# Patient Record
Sex: Female | Born: 1935
Health system: Southern US, Community
[De-identification: ages and names within clinical notes are randomized; demographics above are authoritative.]

## PROBLEM LIST (undated history)

## (undated) DIAGNOSIS — D649 Anemia, unspecified: Secondary | ICD-10-CM

## (undated) DIAGNOSIS — E039 Hypothyroidism, unspecified: Secondary | ICD-10-CM

## (undated) DIAGNOSIS — I10 Essential (primary) hypertension: Secondary | ICD-10-CM

## (undated) DIAGNOSIS — Z9981 Dependence on supplemental oxygen: Secondary | ICD-10-CM

## (undated) DIAGNOSIS — D509 Iron deficiency anemia, unspecified: Secondary | ICD-10-CM

## (undated) DIAGNOSIS — J189 Pneumonia, unspecified organism: Secondary | ICD-10-CM

## (undated) DIAGNOSIS — R55 Syncope and collapse: Secondary | ICD-10-CM

## (undated) DIAGNOSIS — J42 Unspecified chronic bronchitis: Secondary | ICD-10-CM

## (undated) DIAGNOSIS — D500 Iron deficiency anemia secondary to blood loss (chronic): Secondary | ICD-10-CM

## (undated) DIAGNOSIS — R002 Palpitations: Secondary | ICD-10-CM

## (undated) DIAGNOSIS — K31819 Angiodysplasia of stomach and duodenum without bleeding: Secondary | ICD-10-CM

## (undated) HISTORY — PX: LUMBAR DISC SURGERY: SHX700

## (undated) HISTORY — PX: JOINT REPLACEMENT: SHX530

## (undated) HISTORY — PX: TOTAL KNEE ARTHROPLASTY: SHX125

## (undated) HISTORY — PX: BACK SURGERY: SHX140

## (undated) HISTORY — DX: Angiodysplasia of stomach and duodenum without bleeding: K31.819

## (undated) HISTORY — PX: DILATION AND CURETTAGE OF UTERUS: SHX78

## (undated) HISTORY — PX: KNEE ARTHROSCOPY: SHX127

## (undated) HISTORY — DX: Hypothyroidism, unspecified: E03.9

## (undated) HISTORY — PX: TONSILLECTOMY AND ADENOIDECTOMY: SUR1326

## (undated) HISTORY — DX: Iron deficiency anemia secondary to blood loss (chronic): D50.0

## (undated) HISTORY — PX: TOTAL ABDOMINAL HYSTERECTOMY: SHX209

## (undated) HISTORY — DX: Palpitations: R00.2

## (undated) HISTORY — DX: Iron deficiency anemia, unspecified: D50.9

## (undated) HISTORY — PX: HEEL SPUR SURGERY: SHX665

## (undated) HISTORY — PX: SHOULDER SURGERY: SHX246

## (undated) HISTORY — DX: Essential (primary) hypertension: I10

---

## 2000-01-22 ENCOUNTER — Ambulatory Visit (HOSPITAL_COMMUNITY): Admission: RE | Admit: 2000-01-22 | Discharge: 2000-01-22 | Payer: Self-pay | Admitting: Neurosurgery

## 2000-01-22 ENCOUNTER — Encounter: Payer: Self-pay | Admitting: Neurosurgery

## 2000-04-02 ENCOUNTER — Encounter: Payer: Self-pay | Admitting: Neurosurgery

## 2000-04-02 ENCOUNTER — Encounter: Admission: RE | Admit: 2000-04-02 | Discharge: 2000-04-02 | Payer: Self-pay | Admitting: Neurosurgery

## 2000-05-22 ENCOUNTER — Ambulatory Visit (HOSPITAL_COMMUNITY): Admission: RE | Admit: 2000-05-22 | Discharge: 2000-05-22 | Payer: Self-pay | Admitting: Neurosurgery

## 2000-05-22 ENCOUNTER — Encounter: Payer: Self-pay | Admitting: Neurosurgery

## 2000-06-06 ENCOUNTER — Encounter: Payer: Self-pay | Admitting: Neurosurgery

## 2000-06-06 ENCOUNTER — Ambulatory Visit (HOSPITAL_COMMUNITY): Admission: RE | Admit: 2000-06-06 | Discharge: 2000-06-06 | Payer: Self-pay | Admitting: Neurosurgery

## 2000-07-02 ENCOUNTER — Ambulatory Visit (HOSPITAL_COMMUNITY): Admission: RE | Admit: 2000-07-02 | Discharge: 2000-07-02 | Payer: Self-pay | Admitting: Neurosurgery

## 2000-07-02 ENCOUNTER — Encounter: Payer: Self-pay | Admitting: Neurosurgery

## 2001-03-08 ENCOUNTER — Encounter: Payer: Self-pay | Admitting: Neurosurgery

## 2001-03-12 ENCOUNTER — Inpatient Hospital Stay (HOSPITAL_COMMUNITY): Admission: RE | Admit: 2001-03-12 | Discharge: 2001-03-16 | Payer: Self-pay | Admitting: Neurosurgery

## 2001-03-12 ENCOUNTER — Encounter: Payer: Self-pay | Admitting: Neurosurgery

## 2001-03-22 ENCOUNTER — Encounter: Admission: RE | Admit: 2001-03-22 | Discharge: 2001-03-22 | Payer: Self-pay | Admitting: Neurosurgery

## 2001-03-22 ENCOUNTER — Encounter: Payer: Self-pay | Admitting: Neurosurgery

## 2001-05-22 ENCOUNTER — Encounter (HOSPITAL_COMMUNITY): Admission: RE | Admit: 2001-05-22 | Discharge: 2001-06-21 | Payer: Self-pay | Admitting: Internal Medicine

## 2001-09-03 ENCOUNTER — Ambulatory Visit (HOSPITAL_COMMUNITY): Admission: RE | Admit: 2001-09-03 | Discharge: 2001-09-03 | Payer: Self-pay | Admitting: Internal Medicine

## 2001-11-08 ENCOUNTER — Encounter: Payer: Self-pay | Admitting: Family Medicine

## 2001-11-08 ENCOUNTER — Ambulatory Visit (HOSPITAL_COMMUNITY): Admission: RE | Admit: 2001-11-08 | Discharge: 2001-11-08 | Payer: Self-pay | Admitting: Family Medicine

## 2002-03-04 ENCOUNTER — Ambulatory Visit (HOSPITAL_COMMUNITY): Admission: RE | Admit: 2002-03-04 | Discharge: 2002-03-04 | Payer: Self-pay | Admitting: Family Medicine

## 2002-03-04 ENCOUNTER — Encounter: Payer: Self-pay | Admitting: Family Medicine

## 2002-07-22 ENCOUNTER — Ambulatory Visit (HOSPITAL_BASED_OUTPATIENT_CLINIC_OR_DEPARTMENT_OTHER): Admission: RE | Admit: 2002-07-22 | Discharge: 2002-07-22 | Payer: Self-pay | Admitting: Orthopedic Surgery

## 2002-08-12 ENCOUNTER — Encounter (HOSPITAL_COMMUNITY): Admission: RE | Admit: 2002-08-12 | Discharge: 2002-09-11 | Payer: Self-pay | Admitting: Orthopedic Surgery

## 2002-09-12 ENCOUNTER — Encounter (HOSPITAL_COMMUNITY): Admission: RE | Admit: 2002-09-12 | Discharge: 2002-10-12 | Payer: Self-pay | Admitting: Orthopedic Surgery

## 2002-11-12 ENCOUNTER — Encounter: Payer: Self-pay | Admitting: Cardiology

## 2002-11-12 ENCOUNTER — Ambulatory Visit (HOSPITAL_COMMUNITY): Admission: RE | Admit: 2002-11-12 | Discharge: 2002-11-12 | Payer: Self-pay | Admitting: Cardiology

## 2002-12-17 ENCOUNTER — Emergency Department (HOSPITAL_COMMUNITY): Admission: EM | Admit: 2002-12-17 | Discharge: 2002-12-17 | Payer: Self-pay | Admitting: Emergency Medicine

## 2003-07-26 ENCOUNTER — Ambulatory Visit (HOSPITAL_COMMUNITY): Admission: RE | Admit: 2003-07-26 | Discharge: 2003-07-26 | Payer: Self-pay | Admitting: Neurosurgery

## 2003-07-26 ENCOUNTER — Encounter: Payer: Self-pay | Admitting: Neurosurgery

## 2003-08-20 ENCOUNTER — Encounter: Payer: Self-pay | Admitting: Neurosurgery

## 2003-08-20 ENCOUNTER — Encounter: Admission: RE | Admit: 2003-08-20 | Discharge: 2003-08-20 | Payer: Self-pay | Admitting: Neurosurgery

## 2003-09-08 ENCOUNTER — Encounter: Admission: RE | Admit: 2003-09-08 | Discharge: 2003-09-08 | Payer: Self-pay | Admitting: Neurosurgery

## 2003-11-26 ENCOUNTER — Ambulatory Visit (HOSPITAL_COMMUNITY): Admission: RE | Admit: 2003-11-26 | Discharge: 2003-11-26 | Payer: Self-pay | Admitting: Cardiology

## 2004-01-04 ENCOUNTER — Ambulatory Visit (HOSPITAL_COMMUNITY): Admission: RE | Admit: 2004-01-04 | Discharge: 2004-01-04 | Payer: Self-pay | Admitting: Family Medicine

## 2004-02-01 ENCOUNTER — Ambulatory Visit (HOSPITAL_COMMUNITY): Admission: RE | Admit: 2004-02-01 | Discharge: 2004-02-01 | Payer: Self-pay | Admitting: Pediatrics

## 2004-08-23 ENCOUNTER — Encounter (HOSPITAL_COMMUNITY): Admission: RE | Admit: 2004-08-23 | Discharge: 2004-09-22 | Payer: Self-pay | Admitting: Orthopedic Surgery

## 2004-11-28 ENCOUNTER — Ambulatory Visit (HOSPITAL_COMMUNITY): Admission: RE | Admit: 2004-11-28 | Discharge: 2004-11-28 | Payer: Self-pay | Admitting: Internal Medicine

## 2004-12-09 ENCOUNTER — Encounter: Admission: RE | Admit: 2004-12-09 | Discharge: 2004-12-09 | Payer: Self-pay | Admitting: Internal Medicine

## 2006-01-08 ENCOUNTER — Encounter: Admission: RE | Admit: 2006-01-08 | Discharge: 2006-01-08 | Payer: Self-pay | Admitting: Cardiology

## 2006-02-05 ENCOUNTER — Ambulatory Visit (HOSPITAL_COMMUNITY): Admission: RE | Admit: 2006-02-05 | Discharge: 2006-02-05 | Payer: Self-pay | Admitting: Internal Medicine

## 2006-03-12 HISTORY — PX: US ECHOCARDIOGRAPHY: HXRAD669

## 2006-03-14 ENCOUNTER — Ambulatory Visit (HOSPITAL_COMMUNITY): Admission: RE | Admit: 2006-03-14 | Discharge: 2006-03-15 | Payer: Self-pay | Admitting: Orthopedic Surgery

## 2006-03-14 ENCOUNTER — Encounter (INDEPENDENT_AMBULATORY_CARE_PROVIDER_SITE_OTHER): Payer: Self-pay | Admitting: *Deleted

## 2006-04-11 ENCOUNTER — Encounter (HOSPITAL_COMMUNITY): Admission: RE | Admit: 2006-04-11 | Discharge: 2006-05-11 | Payer: Self-pay | Admitting: Orthopedic Surgery

## 2006-05-18 ENCOUNTER — Ambulatory Visit: Admission: RE | Admit: 2006-05-18 | Discharge: 2006-05-18 | Payer: Self-pay | Admitting: Orthopedic Surgery

## 2007-01-09 ENCOUNTER — Encounter: Admission: RE | Admit: 2007-01-09 | Discharge: 2007-01-09 | Payer: Self-pay | Admitting: Cardiology

## 2008-01-22 ENCOUNTER — Encounter: Admission: RE | Admit: 2008-01-22 | Discharge: 2008-01-22 | Payer: Self-pay | Admitting: Cardiology

## 2008-01-31 ENCOUNTER — Encounter: Admission: RE | Admit: 2008-01-31 | Discharge: 2008-01-31 | Payer: Self-pay | Admitting: Neurosurgery

## 2008-04-13 ENCOUNTER — Inpatient Hospital Stay (HOSPITAL_COMMUNITY): Admission: RE | Admit: 2008-04-13 | Discharge: 2008-04-16 | Payer: Self-pay | Admitting: Orthopedic Surgery

## 2008-05-04 ENCOUNTER — Encounter (HOSPITAL_COMMUNITY): Admission: RE | Admit: 2008-05-04 | Discharge: 2008-06-03 | Payer: Self-pay | Admitting: Orthopedic Surgery

## 2008-12-03 ENCOUNTER — Ambulatory Visit (HOSPITAL_COMMUNITY): Admission: RE | Admit: 2008-12-03 | Discharge: 2008-12-03 | Payer: Self-pay | Admitting: Ophthalmology

## 2009-01-22 ENCOUNTER — Encounter: Admission: RE | Admit: 2009-01-22 | Discharge: 2009-01-22 | Payer: Self-pay | Admitting: Cardiology

## 2009-01-28 ENCOUNTER — Encounter: Admission: RE | Admit: 2009-01-28 | Discharge: 2009-01-28 | Payer: Self-pay | Admitting: Cardiology

## 2009-02-10 ENCOUNTER — Ambulatory Visit (HOSPITAL_COMMUNITY): Admission: RE | Admit: 2009-02-10 | Discharge: 2009-02-10 | Payer: Self-pay | Admitting: Ophthalmology

## 2010-02-07 ENCOUNTER — Encounter: Admission: RE | Admit: 2010-02-07 | Discharge: 2010-02-07 | Payer: Self-pay | Admitting: Cardiology

## 2010-04-20 ENCOUNTER — Ambulatory Visit (HOSPITAL_COMMUNITY): Admission: RE | Admit: 2010-04-20 | Discharge: 2010-04-20 | Payer: Self-pay | Admitting: Neurosurgery

## 2010-05-18 ENCOUNTER — Encounter: Admission: RE | Admit: 2010-05-18 | Discharge: 2010-05-18 | Payer: Self-pay | Admitting: Neurosurgery

## 2010-06-03 ENCOUNTER — Encounter: Admission: RE | Admit: 2010-06-03 | Discharge: 2010-06-03 | Payer: Self-pay | Admitting: Neurosurgery

## 2010-06-24 ENCOUNTER — Ambulatory Visit: Payer: Self-pay | Admitting: Cardiology

## 2010-06-24 ENCOUNTER — Encounter: Admission: RE | Admit: 2010-06-24 | Discharge: 2010-06-24 | Payer: Self-pay | Admitting: Cardiology

## 2010-07-08 ENCOUNTER — Ambulatory Visit: Payer: Self-pay | Admitting: Cardiology

## 2010-07-22 ENCOUNTER — Encounter: Admission: RE | Admit: 2010-07-22 | Discharge: 2010-07-22 | Payer: Self-pay | Admitting: Neurosurgery

## 2010-09-28 ENCOUNTER — Ambulatory Visit: Payer: Self-pay | Admitting: Cardiology

## 2010-12-03 ENCOUNTER — Encounter: Payer: Self-pay | Admitting: Neurosurgery

## 2010-12-04 ENCOUNTER — Encounter: Payer: Self-pay | Admitting: Cardiology

## 2010-12-04 ENCOUNTER — Encounter: Payer: Self-pay | Admitting: Neurosurgery

## 2010-12-06 ENCOUNTER — Other Ambulatory Visit: Payer: Self-pay | Admitting: Dermatology

## 2011-01-26 ENCOUNTER — Ambulatory Visit (INDEPENDENT_AMBULATORY_CARE_PROVIDER_SITE_OTHER): Payer: PRIVATE HEALTH INSURANCE | Admitting: Cardiology

## 2011-01-26 DIAGNOSIS — I471 Supraventricular tachycardia: Secondary | ICD-10-CM

## 2011-01-26 DIAGNOSIS — I119 Hypertensive heart disease without heart failure: Secondary | ICD-10-CM

## 2011-01-26 DIAGNOSIS — E781 Pure hyperglyceridemia: Secondary | ICD-10-CM

## 2011-02-27 LAB — BASIC METABOLIC PANEL
BUN: 28 mg/dL — ABNORMAL HIGH (ref 6–23)
CO2: 28 mEq/L (ref 19–32)
Chloride: 101 mEq/L (ref 96–112)
Creatinine, Ser: 0.83 mg/dL (ref 0.4–1.2)

## 2011-02-27 LAB — HEMOGLOBIN AND HEMATOCRIT, BLOOD: HCT: 46.4 % — ABNORMAL HIGH (ref 36.0–46.0)

## 2011-03-01 ENCOUNTER — Other Ambulatory Visit: Payer: Self-pay | Admitting: Cardiology

## 2011-03-01 DIAGNOSIS — Z1231 Encounter for screening mammogram for malignant neoplasm of breast: Secondary | ICD-10-CM

## 2011-03-09 ENCOUNTER — Other Ambulatory Visit: Payer: Self-pay | Admitting: Cardiology

## 2011-03-09 DIAGNOSIS — E039 Hypothyroidism, unspecified: Secondary | ICD-10-CM

## 2011-03-09 DIAGNOSIS — I119 Hypertensive heart disease without heart failure: Secondary | ICD-10-CM

## 2011-03-09 DIAGNOSIS — R002 Palpitations: Secondary | ICD-10-CM

## 2011-03-13 NOTE — Telephone Encounter (Signed)
escribe request  

## 2011-03-14 ENCOUNTER — Ambulatory Visit: Payer: PRIVATE HEALTH INSURANCE

## 2011-03-17 ENCOUNTER — Ambulatory Visit
Admission: RE | Admit: 2011-03-17 | Discharge: 2011-03-17 | Disposition: A | Discharge status: Home / Self Care | Payer: PRIVATE HEALTH INSURANCE | Source: Ambulatory Visit | Attending: Cardiology | Admitting: Cardiology

## 2011-03-17 DIAGNOSIS — Z1231 Encounter for screening mammogram for malignant neoplasm of breast: Secondary | ICD-10-CM

## 2011-03-28 NOTE — H&P (Signed)
[name], [name]                  ACCOUNT NO.:  [id number]   MEDICAL RECORD NO.:  [id number]         PATIENT TYPE:  LINP   LOCATION:                               FACILITY:  [facility]   PHYSICIAN:  Ollen Gross, M.D.    DATE OF BIRTH:  18-Jan-1933   DATE OF ADMISSION:  04/13/2008  DATE OF DISCHARGE:                              HISTORY & PHYSICAL   DATE OF ADMISSION:  Tomorrow, April 13, 2008.   CHIEF COMPLAINT:  Right knee pain.   HISTORY OF PRESENT ILLNESS:  The patient is a 77 year old female who has  been seen by Dr. Lequita Halt for ongoing problem with her knees.  She has  had known arthritis for quite some time now.  She has been treated  conservatively in the past utilizing medications and also injections  including Synvisc.  Despite conservative measures and injections, the  patient continues to have pain.  She has reached a point where she would  like have something done about it.  Risks and benefits of the surgical  procedure have been discussed with the patient, and she elects to  proceed with surgery.  She has been seen preoperatively by Dr. Patty Sermons  and felt to be stable for surgery.   ALLERGIES:  PENICILLIN causes welts; SULFA causes rash.  (Please note  the patient is able to take Ancef).   CURRENT MEDICATIONS:  Digoxin, levothyroxine, Maxzide, Centrum Silver.   PAST MEDICAL HISTORY:  1. Mild hypertension.  2. Hiatal hernia.  3. Hemorrhoids.  4. Heart arrhythmia to include PVCs.  5. Hypothyroidism.  6. Postmenopausal.  7. Childhood illnesses to include scarlet fever, measles and mumps.   PAST SURGICAL HISTORY:  1. Total abdominal hysterectomy in 1996.  2. Tonsils and adenoids at age 56.  3. Ankle surgery.  4. Right shoulder surgery.  5. Back surgery x2.  6. Right knee arthroscopy.   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  No alcohol.  Nonsmoker.  Husband and granddaughter will be  assisting with care after surgery.   She also has a son and daughter who  live close by.   FAMILY HISTORY:  Father age 6.  Mother age __________ with kidney  problems.   REVIEW OF SYSTEMS:  GENERAL:  No fevers, chills, night sweats.  NEURO:  No seizures, syncope, or paralysis.  RESPIRATORY: No shortness breath, productive cough or hemoptysis.  CARDIOVASCULAR: No chest pain or orthopnea.  GI: No nausea, vomiting, diarrhea, or constipation.  GU: No dysuria or hematuria.  MUSCULOSKELETAL: Right knee.   PHYSICAL EXAMINATION:  VITAL SIGNS: Pulse 60, respirations 12, blood  pressure 142/72.  GENERAL: A 77 year old white female well-nourished, well-developed, no  acute distress.  She is alert and cooperative, pleasant, excellent  historian.  HEENT: Normocephalic, atraumatic.  Pupils are round and reactive.  Oropharynx clear.  EOMs intact.  Never wore glasses.  Does have a  partial upper denture plate.  NECK:  Supple.  CHEST: Clear.  HEART: Regular rate and rhythm.  No murmur.  ABDOMEN: Soft, nontender.  Bowel sounds  present.  RECTAL/BREASTS/GENITALIA:  Not done.  EXTREMITIES:  Right knee shows a slight varus malalignment deformity.  Range of motion 10-120, moderate crepitus.  Tender more medial than  lateral.  No instability.  The left knee shows range of motion 5-120,  moderate crepitus, less tender than the right knee.  No effusion.  No  instability.   IMPRESSION:  1. Osteoarthritis, right knee greater than left.  2. Mild hypertension.  3. Hiatal hernia.  4. Hemorrhoids.  5. History of PVCs.  6. Hypothyroidism.  7. Postmenopausal.   PLAN:  The patient was admitted to [facility] and will undergo  a right total knee replacement arthroplasty.  Surgery will be performed  by Ollen Gross.  She has been seen preoperatively by Dr. Patty Sermons and  felt to be stable for surgery.  Dr. Patty Sermons will be notified of the  room number and admission and will be consulted if needed for any  assistance with this  patient throughout the hospital course.      Alexzandrew L. Perkins, P.A.C.      Ollen Gross, M.D.  Electronically Signed    ALP/MEDQ  D:  04/12/2008  T:  04/12/2008  Job:  [id number]   cc:   Ollen Gross, M.D.  Fax: [phone]   Kingsley Callander. Ouida Sills, MD  Fax: [phone]   Cassell Clement, M.D.  Fax: [phone]

## 2011-03-28 NOTE — Op Note (Signed)
[name], [name]                  ACCOUNT NO.:  [id number]   MEDICAL RECORD NO.:  [id number]          PATIENT TYPE:  INP   LOCATION:  0002                         FACILITY:  [facility]   PHYSICIAN:  Ollen Gross, M.D.    DATE OF BIRTH:  Mar 12, 1933   DATE OF PROCEDURE:  04/13/2008  DATE OF DISCHARGE:                               OPERATIVE REPORT   PREOPERATIVE DIAGNOSIS:  Osteoarthritis right knee.   POSTOPERATIVE DIAGNOSIS:  Osteoarthritis right knee.   PROCEDURE:  Right total knee arthroplasty.   SURGEON:  Ollen Gross, M.D.   ASSISTANT:  Oneida Alar PA-C   ANESTHESIA:  General with postop Marcaine pain pump.   ESTIMATED BLOOD LOSS:  Minimal   DRAINS:  None.   TOURNIQUET TIME:  31 minutes at 300 mmHg.   COMPLICATIONS:  None.   CONDITION:  Stable to recovery room.   CLINICAL NOTE:  [name] is a 77 year old female with end-stage  arthritis of the right knee with progressively worsening pain and  dysfunction.  She has failed nonoperative management and presents now  for right total knee arthroplasty.   PROCEDURE IN DETAIL:  After successful administration of general  anesthetic, a tourniquet was placed on the right thigh and right lower  extremity is prepped and draped in the usual sterile fashion.  Extremity  was wrapped in Esmarch, knee flexed and tourniquet inflated to 300 mmHg.  Midline incision was made with 10 blade through subcutaneous tissue to  the level of the extensor mechanism.  A fresh blade is used make a  medial parapatellar arthrotomy.  Soft tissue over the proximal and  medial tibia is subperiosteally elevated to the joint line with the  knife and into the semimembranosus bursa with a Cobb elevator.  Soft  tissue laterally is elevated with attention being paid to avoiding the  patellar tendon on tibial tubercle.  The patella is subluxed laterally,  knee flexed to 90 degrees, ACL and PCL removed.  Drill was used create a  starting hole in the distal femur  and the canal was thoroughly  irrigated.  The 5 degrees right valgus alignment guide is placed and  referencing off the posterior condyles, rotations marked and the block  pinned to remove 11 mm of the distal femur.  I took 11 because of preop  flexion contracture.  Distal femoral resection is made with an  oscillating saw.  Size three is the most appropriate femoral component.  The rotation is marked off the epicondylar axis and size 3 cutting block  is placed.  The anterior, posterior and chamfer cuts were made.   Tibia subluxed forward and menisci removed.  The extramedullary tibial  alignment guide is placed referencing proximally at the medial aspect of  the tibial tubercle and distally along the second metatarsal axis and  tibial crest.  A block is pinned to remove about 4 mm of the more  deficient medial side.  Tibial resection is made with an oscillating  saw.  Size 3 is the most appropriate tibial component and the proximal  tibia is prepared with the modular  drill and keel punch for a size 3.  Femoral preparation is completed the intercondylar cut.   Size 3 mobile bearing tibial trial, size 3 posterior stabilized femoral  trial and 12.5 mm posterior stabilized rotating platform insert trial  are placed.  With the 12.5, full extensions achieved with a little bit  of varus-valgus play and anterior-posterior play.  I went to 15 which  still allowed for full extension with excellent varus, valgus, anterior  and posterior balance.  Patella was everted and thickness measured to be  24 mL.  Freehand resection was taken to 14 mm, 35 template is placed,  lug holes were drilled, trial patella was placed and it tracks normally.  Osteophytes were removed off the posterior femur with a trial in place.  All trials were removed and the cut bone surfaces were prepared with  pulsatile lavage.  Cement was mixed and once ready for implantation, the  size 3 mobile bearing tibial tray, size 3  posterior stabilized femur and  35 patella were cemented into place.  The patella was held with the  clamp.  Trial 15 insert is placed, knee held in full extension and all  extruded cement removed.  When the cement fully is hardened, then the  wound is copiously irrigated with saline solution and FloSeal injected  on the posterior capsule.  The permanent 15 mm posterior stabilized  rotating platform insert is then placed in the tibial tray.  FloSeal is  injected in the mediolateral gutters in the suprapatellar area.  A moist  sponge is placed and tourniquet then released for a total time of 31  minutes.  Sponge is held for 2 minutes then removed.  Minimal bleeding  is encountered.  That which is encountered is stopped with  electrocautery.  Wound is again copiously irrigated with saline solution  and the extensor mechanism closed with interrupted #1 PDS.  Flexion  against gravity to 135 degrees.  Subcu is closed with interrupted 2-0  Vicryl and subcuticular running 4-0 Monocryl.  The incision is cleaned  and dried and Steri-Strips and bulky sterile dressing applied.  She is  then placed into a knee immobilizer, awakened and transferred to  recovery in stable condition.      Ollen Gross, M.D.  Electronically Signed     FA/MEDQ  D:  04/13/2008  T:  04/13/2008  Job:  [id number]

## 2011-03-28 NOTE — Discharge Summary (Signed)
[name], [name]                  ACCOUNT NO.:  [id number]   MEDICAL RECORD NO.:  [id number]         PATIENT TYPE:  LINP   LOCATION:                               FACILITY:  [facility]   PHYSICIAN:  Ollen Gross, M.D.    DATE OF BIRTH:  April 20, 1933   DATE OF ADMISSION:  04/13/2008  DATE OF DISCHARGE:  04/16/2008                               DISCHARGE SUMMARY   ADMISSION DIAGNOSES:  1. Osteoarthritis right knee greater than left.  2. Mild hypertension.  3. Hiatal hernia.  4. Hemorrhoids.  5. History of premature ventricular contractions.  6. Hypothyroidism.  7. Postmenopausal.   DISCHARGE DIAGNOSES:  1. Osteoarthritis right knee status post right total knee replacement      arthroplasty.  2. Mild postoperative hyponatremia, improved.  3. Mild hypertension.  4. Hiatal hernia.  5. Hemorrhoids.  6. History of premature ventricular contractions.  7. Hypothyroidism.  8. Postmenopausal.   PROCEDURE:  April 13, 2008:  Right total knee.   SURGEON:  Ollen Gross, M.D.   ASSISTANT:  Jamelle Rushing, P.A.   ANESTHESIA:  General.   CONSULTATIONS:  None.   BRIEF HISTORY:  [name] is a 77 year old female with end-stage  arthritis right knee, progressive and worsening pain, dysfunction  recommended operative management, now presents for total knee  arthroplasty.   LABORATORY DATA:  Preop CBC showed hemoglobin 15.5, hematocrit 45.7,  white cell count 8.6, platelets 245, postop hemoglobin 13.1, drift down  last H&H 11.8 and 34.6.  PT/PTT preop 13.5 and 29 respectively, INRs  1.0.  Serial pro-times followed.  PT/INR 25.8 and 2.3.  Chem panel on  admission all within normal limits with the exception of low albumin of  3.4.  Serial BMETs were followed.  Sodium did drop from 141-132, back up  to 137.  Preop UA moderate leukocyte esterase, 11-20 white cells, few  bacteria.  This was treated preoperatively.  Blood group type B+   DIAGNOSTICS:  1. Chest x-ray Apr 03, 2008:  No interval  change or acute process,      mild cardiomegaly.  2.  EKG Apr 03, 2008:  Sinus bradycardia with      occasional PVCs confirmed by Dr. Patty Sermons.   HOSPITAL COURSE:  The patient was admitted to [facility] and  tolerated the procedure well, later transferred to the recovery room on  the orthopedic floor.  Started on PCA and p.o. analgesic pain control  following surgery.  Given 24 hours postop IV antibiotics.  Back was a  little sore on the morning of day 1.  Started getting up out of bed.  Blood pressures a little on the lower side.  Blood pressure meds were  placed on parameters.  Hemoglobin was stable.  Dressing was good.  Sodium is a little low, so we decreased the fluids.  Got up and walked  about 4 feet on day 2.  Dressing was changed.  Incision was healing  well, no signs of infection.  Hemoglobin was stable at 13.  Did get up  and walk about 70 feet or more.  Tolerating therapy well.  Weaned over  to p.o.  medications.  By day 3 the following day, the patient is doing  well, walking over 100 feet.  Incision clean and well, tolerating meds  and is discharged home.   DISPOSITION:  The patient was discharged home on April 16, 2008.   DISCHARGE MEDICATIONS:  1. Coumadin.  2. Percocet.  3. Robaxin.   FOLLOW UP:  Follow up Tuesday, April 28, 2008.   ACTIVITY:  Weightbearing as tolerated, total knee protocol.   CONDITION ON DISCHARGE:  Improved.      Alexzandrew L. Perkins, P.A.C.      Ollen Gross, M.D.  Electronically Signed    ALP/MEDQ  D:  05/13/2008  T:  05/13/2008  Job:  [id number]   cc:   Cassell Clement, M.D.  Fax: [phone]   Kingsley Callander. Ouida Sills, MD  Fax: [phone]

## 2011-03-31 NOTE — Op Note (Signed)
[place]  Patient:    [name], [name]                         MRN: [id number] Proc. Date: 03/12/01 Adm. Date:  [id number] Attending:  Emeterio Reeve                           Operative Report  PREOPERATIVE DIAGNOSIS:  Cervical spondylosis with left L3 radiculopathy at L3-4.  POSTOPERATIVE DIAGNOSIS:  Cervical spondylosis with left L3 radiculopathy at L3-4.  SURGEON:  Payton Doughty, M.D.  PROCEDURE:  L3-4 laminectomy diskectomy with posterior lumbar interbody fusion with ray threaded fusion cage.  COMPLICATIONS:  None.  DESCRIPTION OF PROCEDURE:  This is a 77 year old right-handed white lady with severe L3 radiculopathy and spondylosis at 3-4.  She was taken to the operating room and smoothly anesthetized and intubated and placed prone on the operating room table.  Following shave, prep and drape in the usual sterile fashion, skin was infiltrated with 1% lidocaine and 1:400,000 epinephrine.  The skin was incised from the top of L2 to the top of L4 and the laminae of L2 and L3 were exposed bilaterally in a subperiosteal plane.  Intraoperative x-ray was obtained and a marker was found to be under L2.  The laminectomy and diskectomy and fusion took place at the next level down.  The lamina pars interarticularis and inferior facet of L3 and the superior facet of L4 were removed bilaterally using the high-speed drill and the bone set aside for grafting.  Ligamentum of flavum was dissected bilaterally and the 3 and 4 roots were isolated as they rounded their respective pedicles.  Particularly on the left side there was a tremendous amount of lateral recess narrowing.  The facet was quite hypertrophic and compressed the root underneath it.  Following decompression of both roots, diskectomy was carried out bilaterally.  A 16 x 21 mm ray threaded fusion cages were then placed.  Intraoperative x-rays showed good placement of cages.  They were packed  with bone graft harvested from the facet joints and capped.  The wound was irrigated and hemostasis assured.  The fascia was reapproximated with 0 Vicryl in an interrupted fashion.  The subcutaneous tissue was reapproximated with 0 Vicryl in an interrupted fashion.  The subcuticular tissues were reapproximated with 0 Vicryl in an interrupted fashion.  The skin was closed with 3-0 nylon in a running locked fashion.  Betadine and Telfa dressing was applied and made occlusive with Op-Site.  The patient returned to the recovery room in good condition. DD:  03/12/01 TD:  03/12/01 Job: [id number]

## 2011-03-31 NOTE — Op Note (Signed)
[name], [name]                  ACCOUNT NO.:  [id number]   MEDICAL RECORD NO.:  [id number]          PATIENT TYPE:  OIB   LOCATION:  1519                         FACILITY:  [facility]   PHYSICIAN:  Georges Lynch. Gioffre, M.D.DATE OF BIRTH:  1933/07/31   DATE OF PROCEDURE:  03/14/2006  DATE OF DISCHARGE:                                 OPERATIVE REPORT   SURGEON:  Georges Lynch. Darrelyn Hillock, M.D.   ASSISTANT:  Jamelle Rushing, P.A.-C.   PREOPERATIVE DIAGNOSIS:  1.  Severe impingement syndrome right shoulder.  2.  Partial tear rotator cuff tendon, right shoulder.   POSTOPERATIVE DIAGNOSIS:  1.  Severe impingement syndrome right shoulder.  2.  Partial tear rotator cuff tendon, right shoulder.   OPERATION:  1.  Open decompression of the right shoulder.  2.  Repair of a small hole in the rotator cuff tendon, right shoulder.   PROCEDURE:  Under general anesthesia, routine orthopedic prepping and  draping of the right shoulder was carried out.  Note, prior to the general  anesthetic, she had an interscalene nerve block on the right.  She also had  1 gram of IV Ancef.  A small incision was made over the anterior aspect of  the right shoulder.  Bleeders were identified and cauterized.  At this time,  I identified the deltoid tendon and dissected it free from the acromion by  sharp dissection.  Following that, I protected the underlying rotator cuff  with the Bennett retractor and utilizing the oscillating saw, did a partial  acromionectomy.  I then utilized the bur to do an acromioplasty.  We  thoroughly irrigated out the area and removed the bursa.  We did a  bursectomy and sent the bursa sac down to the lab.  She had a very markedly  thickened subdeltoid bursa.  Following that, I bone waxed the undersurface  of the acromion.  I then thoroughly irrigated out the area and inserted some  thrombin soaked Gelfoam in the subacromial space.  I then utilized one  Ethibond suture to repair a small hole in the  rotator cuff tendon.  The cuff  was somewhat thinned out but there was no reason to put any graft in at this  time.  At this particular time, we then reapproximated the deltoid tendon  and muscle in the usual fashion.  The subcu was closed with 0 Vicryl, the  skin with metal staples.  A sterile Neosporin dressing was applied and she  was placed in a shoulder immobilizer.           ______________________________  Georges Lynch Darrelyn Hillock, M.D.     RAG/MEDQ  D:  03/14/2006  T:  03/14/2006  Job:  [id number]

## 2011-03-31 NOTE — Consult Note (Signed)
[place]. [facility]  Patient:    [name], [name]                         MRN: [id number] Proc. Date: 03/15/01 Adm. Date:  [id number] Disc. Date: [id number] Attending:  Emeterio Reeve CC:         Payton Doughty, M.D.   Consultation Report  CHIEF COMPLAINT:  "I cant urinate."  HISTORY OF PRESENT ILLNESS:  [name] is a lovely 77 year old white female who is status post lumbar laminectomy and fusion on March 12, 2001.  She notes she was voiding perfectly well at home without incontinence, leakage, and had good flow prior to surgery but since surgery has experienced urinary retention.  She has failed several voiding trials despite Urecholine and thus for evaluation.  She notes that she has really no perineal discomfort, no bladder discomfort per se, and appears to have normal sensation in the area of her perineum and bladder.  She notes she is having back pain but it is resolving and she is becoming ambulatory.  ALLERGIES: 1. PENICILLIN. 2. SULFA.  MEDICATIONS: 1. Ziac 5 mg p.o. q.a.m. 2. Lanoxin 0.125 mg p.o. q.a.m. 3. Synthroid 0.125 mg p.o. q.a.m. 4. Darvocet as needed. 5. Motrin as needed.  PAST MEDICAL HISTORY:  She has hypertension.  She has had some arrhythmias. She has thyroid dysfunctions and has been on Synthroid for years.  PAST SURGICAL HISTORY:  She had a T&A as a child.  She had bilateral knee surgery in 1993, transabdominal hysterectomy in 1996, a spur on her spine removed in 1999, and has had the lumbar fusion, as noted, just recently.  SOCIAL HISTORY:  Negative smoker, negative drinker.  FAMILY HISTORY:  Not significant.  REVIEW OF SYSTEMS:  She had no shortness of breath, dyspnea on exertion, or chest pain.  She had some slight nausea and vomiting, probably secondary to the surgery.  PHYSICAL EXAMINATION:  VITAL SIGNS:  Blood pressure 150/90, pulse 60, respirations 12, temperature 99 degrees Fahrenheit.  GENERAL:  She is  well-developed and well-developed in no acute distress.  She is fairly immobile from her recent back surgery.  HEENT:  PERRLA.  Ears, nose, and throat clear.  NECK:  Without masses or organomegaly.  CHEST:  Clear without rales or rhonchi.  ABDOMEN:  Soft and nontender without masses, organomegaly, or hernias.  EXTREMITIES:  Essentially normal.  GU:  Deferred. She is really immobile and cannot tolerate that.  NEUROLOGIC:  I will defer you to Dr. Rolan Bucco note for full details on that, although she neurologically appears to be intact.  SKIN:  Without lesions.  LABORATORY DATA:  BUN/creatinine is 17/0.8.  Urinalysis is negative.  IMPRESSION:  Postoperative urinary retention probably secondary to recumbency, pain, analgesics, and debilitation.  It should resolve when she becomes ambulatory and decreases analgesic use.  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  PLAN: 1. Discontinue the Urecholine. 2. Begin Flomax 0.4 mg p.o. q.h.s. 3. Place a Foley catheter with a leg and bedside bag. 4. She will see me in 7-10 days for a voiding trial.  We will follow up.    Please call as needed. DD:  03/15/01 TD:  03/18/01 Job: [id number]

## 2011-03-31 NOTE — Op Note (Signed)
NAME:  [name], [name]                            ACCOUNT NO.:  [id number]   MEDICAL RECORD NO.:  [id number]                   PATIENT TYPE:  AMB   LOCATION:  DSC                                  FACILITY:  [facility]   PHYSICIAN:  Sherri Rad, M.D.               DATE OF BIRTH:  1933-01-16   DATE OF PROCEDURE:  07/22/2002  DATE OF DISCHARGE:                                 OPERATIVE REPORT   PREOPERATIVE DIAGNOSES:  1. Right tight gastrocnemius  2. Right Haglund deformity.  3. Calcification of right Achilles tendon.   POSTOPERATIVE DIAGNOSES:  1. Right tight gastrocnemius.  2. Right  Haglund deformity.  3. Calcification of right Achilles tendon.   OPERATION:  1. Right gastrocnemius slide.  2. Right Haglund deformity incision.  3. Right excision calcification within the Achilles tendon.   ANESTHESIA:  General endotracheal tube.   SURGEON:  Sherri Rad, M.D.   ASSISTANT:  Shawna Orleans, PA   ESTIMATED BLOOD LOSS:  Minimal.   TOURNIQUET TIME:  53 minutes.   COMPLICATIONS:  None.   DISPOSITION:  Stable to the PR.   INDICATIONS:  This is a 77 year old female whose had longstanding posterior  right heel pain that has been interfering with her life despite wearing an  elevated heel, anti-inflammatories, and rest.  She has consented for the  above procedure.  All risks which include infection, nerve/vessel injury,  persistent pain, worsening of pain, Achilles tendon rupture, stiffness, and  questions were answered.   PROCEDURE:  The patient was brought to the operative room and placed in  supine position.  Initially, after adequate general endotracheal tube  anesthesia was administered as well as Ancef 1 gm IV piggyback, we then  placed her in a prone position.  All prominences were well padded after a  tourniquet was placed on the proximal right thigh.  The right lower  extremity was then prepped and draped in a sterile manner.  We started the  procedure with the  gastroc slide.  With a longitudinal incision over the  medial gastroc muscle-tendon junction, dissection was carried down to  fascia.   Hemostasis was obtained.  The fascia was opened in line with the incision.  Gastroc muscle-tendon junction was identified posteriorly.  Soft tissues  were elevated.  Sural nerve was identified and protected posteriorly  throughout the remaining portion of the case.  The conjoint region between  the gastroc and soleus tendons was then developed and then with a Mayo  scissors the tendon was released.  This gave excellent release of the tight  gastroc.   We then gradually exsanguinated the right lower extremity.  The tourniquet  was elevated 290 mmHg.  We made a longitudinal incision over the medial and  lateral aspects of the Achilles tendon.  Dissection was carried down to the  calcaneal tuber.  The lateral and medial aspects of the  Achilles tendon were  elevated off the calcaneal tuber.  The calcaneal was then resected with a  sagittal saw protecting soft tissues posteriorly.  After this was done, we  then meticulously removed through both the medial and lateral incisions the  calcification within the Achilles tendon.   Once this was removed, the edges of the calcaneal tuber both medially and  laterally were rounded off with a rongeur.  The wound was copiously  irrigated with normal saline.  Achilles tendon was intact throughout the  procedure.  A mini C-arm view was taken intraoperatively and shows adequate  decompression of the Haglund's as well as calcification within the Achilles  tendon.  This was then palpated through the skin as well and was adequately  decompressed.   Once the wounds were copiously irrigated with normal saline, the subcu was  closed with 3-0 Vicryl, skin was closed with 4-0 nylon.  The tourniquet was  deflated after 53 minutes.  Sterile dressing was applied with the ankle in  gravity equinus.  The patient was stable to  PR.                                                 Sherri Rad, M.D.    PAB/MEDQ  D:  07/22/2002  T:  07/23/2002  Job:  [id number]

## 2011-03-31 NOTE — H&P (Signed)
[place]  Patient:    [name], [name]                         MRN: [id number] Adm. Date:  [id number] Attending:  Emeterio Reeve                         History and Physical  ADMITTING DIAGNOSIS:  L3-4 spondylosis with left L3 radiculopathy.  SERVICE:  Neurosurgery.  HISTORY OF PRESENT ILLNESS:  [age]-year-old right-handed white lady who in 1998 had a left lateral osteophyte resection at 3-4 for L3 radiculopathy and did well.  She has had progression of her symptoms for about the past six months and had an L3-4 facet block on the left that produced complete relief of her left leg pain and she is now admitted for a fusion at that level.  MEDICAL HISTORY:  Remarkable for modest heart failure, occasional PACs, borderline hypertension.  MEDICATIONS: 1. Lanoxin 0.125 mg a day. 2. Synthroid 0.1 mg a day. 3. Estratab 0.625 mg a day.  SURGICAL HISTORY:  Remarkable for tonsillectomy, D&C and the laterally approached osteophyte removal in 1998.  ALLERGIES:  She is allergic to PENICILLIN.  FAMILY HISTORY:  Mom died at the age of 42 with lipoid nephrosis.  Dad passed away at 90 of complications related to senescence.  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  PHYSICAL EXAMINATION:  HEENT:  Within normal limits.  NECK:  She had good range of motion of her neck.  CHEST:  Clear.  CARDIAC:  Regular rate and rhythm.  ABDOMEN:  Nontender with no hepatosplenomegaly.  EXTREMITIES:  Without clubbing or cyanosis.  GU:  Deferred.  PERIPHERAL PULSES:  Good.  NEUROLOGIC:  She is awake, alert and oriented.  Her cranial nerves are intact. Motor exam shows 5/5 throughout the upper and lower extremities save for mild knee extensor weakness on the left side, sensory deficit described in the left L3 distribution.  Deep tendon reflexes are 2 at the right knee, flicker at the left, 1 at the ankles.   Toes are downgoing bilaterally and straight leg raise is positive on the left side.  DATA:  Her MRI results and injection results have been reviewed above.  CLINICAL IMPRESSION:  Left L3 radiculopathy secondary to foraminal lateral recess narrowing at L3-4.  PLAN:  The plan is for a lumbar laminectomy, diskectomy and posterior lumbar interbody fusion at L3-4.  The risks and benefits of this approach have been discussed with her and she wishes to proceed. DD:  03/12/01 TD:  03/12/01 Job: [id number]

## 2011-03-31 NOTE — Discharge Summary (Signed)
[place]  Patient:    [name], [name]                         MRN: [id number] Adm. Date:  [id number] Disc. Date: [id number] Attending:  Emeterio Reeve                           Discharge Summary  ADMISSION DIAGNOSIS:  L3-4 spondylosis with L3 radiculopathy.  DISCHARGE DIAGNOSES: 1. L3-4 spondylosis with L3 radiculopathy. 2. Urinary incontinence.  HISTORY OF PRESENT ILLNESS:  The patient was admitted by Dr. Channing Mutters because of back and left leg pain.  This lady back in 1998 had an osteophyte resection. She had been complaining of worsening of the pain and despite conservative treatment, she was not any better.  Surgery was advised by Dr. Channing Mutters.  LABORATORY DATA:  Normal.  HOSPITAL COURSE:  The patient was admitted to Dr. Channing Mutters.  He went ahead and did bilateral L3-4 diskectomy followed by Ray cages.  The patient did well.  She has had minimal complaints, but she developed urinary incontinence.  She was seen by Windy Fast L. Ovidio Hanger, M.D. from the urology service who inserted Foley catheter and started her on Flomax.  Today the patient is doing really well, is taking minimal pain medications, and she is ready to go home.  CONDITION ON DISCHARGE:  Improved.  DISCHARGE MEDICATIONS: 1. Flomax. 2. Vicodin for pain.  DIET:  Regular.  ACTIVITY:  She is not to drive.  She is not to do any lifting.  FOLLOW-UP:  She is to be seen by Dr. Channing Mutters in two weeks and by Dr. Earlene Plater in one week. DD:  03/16/01 TD:  03/18/01 Job: [id number]

## 2011-06-19 ENCOUNTER — Encounter: Payer: Self-pay | Admitting: Cardiology

## 2011-06-22 ENCOUNTER — Encounter: Payer: Self-pay | Admitting: Cardiology

## 2011-06-23 ENCOUNTER — Ambulatory Visit (INDEPENDENT_AMBULATORY_CARE_PROVIDER_SITE_OTHER): Payer: PRIVATE HEALTH INSURANCE | Admitting: *Deleted

## 2011-06-23 ENCOUNTER — Ambulatory Visit (INDEPENDENT_AMBULATORY_CARE_PROVIDER_SITE_OTHER): Payer: PRIVATE HEALTH INSURANCE | Admitting: Cardiology

## 2011-06-23 ENCOUNTER — Encounter: Payer: Self-pay | Admitting: Cardiology

## 2011-06-23 DIAGNOSIS — G4700 Insomnia, unspecified: Secondary | ICD-10-CM

## 2011-06-23 DIAGNOSIS — E039 Hypothyroidism, unspecified: Secondary | ICD-10-CM | POA: Insufficient documentation

## 2011-06-23 DIAGNOSIS — K219 Gastro-esophageal reflux disease without esophagitis: Secondary | ICD-10-CM | POA: Insufficient documentation

## 2011-06-23 DIAGNOSIS — R002 Palpitations: Secondary | ICD-10-CM | POA: Insufficient documentation

## 2011-06-23 DIAGNOSIS — E7800 Pure hypercholesterolemia, unspecified: Secondary | ICD-10-CM

## 2011-06-23 DIAGNOSIS — I119 Hypertensive heart disease without heart failure: Secondary | ICD-10-CM | POA: Insufficient documentation

## 2011-06-23 LAB — BASIC METABOLIC PANEL
CO2: 29 mEq/L (ref 19–32)
Calcium: 9 mg/dL (ref 8.4–10.5)
Creatinine, Ser: 1.1 mg/dL (ref 0.4–1.2)
Glucose, Bld: 89 mg/dL (ref 70–99)

## 2011-06-23 LAB — LIPID PANEL
HDL: 55.3 mg/dL (ref >39.00)
Triglycerides: 110 mg/dL (ref 0.0–149.0)

## 2011-06-23 LAB — HEPATIC FUNCTION PANEL
Albumin: 3.7 g/dL (ref 3.5–5.2)
Alkaline Phosphatase: 86 U/L (ref 39–117)
Total Protein: 7 g/dL (ref 6.0–8.3)

## 2011-06-23 LAB — T4, FREE: Free T4: 1.12 ng/dL (ref 0.60–1.60)

## 2011-06-23 MED ORDER — TEMAZEPAM 15 MG PO CAPS: 15.0000 mg | ORAL_CAPSULE | Freq: Every evening | ORAL | Status: DC | PRN

## 2011-06-23 NOTE — Progress Notes (Signed)
[name] Date of Birth:  Nov 11, 1933 [facility] / [facility] [address]   [address] [place], [place]  [address] [phone]           Fax   [phone]  History of Present Illness: This pleasant 77 year old woman is seen for a scheduled followup office visit.  She has a past history of essential hypertension, palpitations, and hypothyroidism.  She also has a history of osteoarthritis.  Since last visit she's been feeling well.  She's had no chest pain or shortness of breath.  She has had symptoms of GERD and is on Protonix daily with improvement.  She's having difficulty sleeping at night and is requesting something for that and we will give her generic Restoril 15 mg h.s. P.r.n.  Current Outpatient Prescriptions  Medication Sig Dispense Refill  . aspirin 81 MG tablet Take 81 mg by mouth daily.        . bisoprolol-hydrochlorothiazide (ZIAC) 10-6.25 MG per tablet TAKE ONE TABLET BY MOUTH EVERY DAY  90 tablet  3  . digoxin (LANOXIN) 0.125 MG tablet TAKE ONE TABLET BY MOUTH EVERY DAY  90 tablet  3  . levothyroxine (SYNTHROID, LEVOTHROID) 125 MCG tablet TAKE ONE TABLET BY MOUTH EVERY DAY  90 tablet  3  . metoprolol succinate (TOPROL-XL) 25 MG 24 hr tablet Take 25 mg by mouth daily.        . Multiple Vitamin (MULTIVITAMIN) tablet Take 1 tablet by mouth daily.        . pantoprazole (PROTONIX) 40 MG tablet Take 40 mg by mouth daily.        Marland Kitchen triamterene-hydrochlorothiazide (MAXZIDE) 75-50 MG per tablet TAKE ONE-HALF TABLET BY MOUTH EVERY DAY  90 tablet  3    Allergies  Allergen Reactions  . Avapro (Irbesartan)   . Biaxin   . Hyzaar   . Sulfa Drugs Cross Reactors     There is no problem list on file for this patient.   History  Smoking status  . Never Smoker   Smokeless tobacco  . Not on file    History  Alcohol Use No    Family History  Problem Relation Age of Onset  . Hypertension Mother   . Hypertension Father   . Heart attack Father     Review of  Systems: Constitutional: no fever chills diaphoresis or fatigue or change in weight.  Head and neck: no hearing loss, no epistaxis, no photophobia or visual disturbance. Respiratory: No cough, shortness of breath or wheezing. Cardiovascular: No chest pain peripheral edema, palpitations. Gastrointestinal: No abdominal distention, no abdominal pain, no change in bowel habits hematochezia or melena. Genitourinary: No dysuria, no frequency, no urgency, no nocturia. Musculoskeletal:No arthralgias, no back pain, no gait disturbance or myalgias. Neurological: No dizziness, no headaches, no numbness, no seizures, no syncope, no weakness, no tremors. Hematologic: No lymphadenopathy, no easy bruising. Psychiatric: No confusion, no hallucinations, no sleep disturbance.    Physical Exam: The blood pressure is 130/70.  Pulse is 80.  The general appearance feels a well-developed well-nourished elderly woman in no acute distress.Pupils equal and reactive.   Extraocular Movements are full.  There is no scleral icterus.  The mouth and pharynx are normal.  The neck is supple.  The carotids reveal no bruits.  The jugular venous pressure is normal.  The thyroid is not enlarged.  There is no lymphadenopathy.  The chest is clear to percussion and auscultation. There are no rales or rhonchi. Expansion of the chest  is symmetrical.  The precordium is quiet.  The first heart sound is normal.  The second heart sound is physiologically split.  There is no murmur gallop rub or click.  There is no abnormal lift or heave.  The abdomen is soft and nontender. Bowel sounds are normal. The liver and spleen are not enlarged. There Are no abdominal masses. There are no bruits.  The pedal pulses are good.  There is no phlebitis or edema.  There is no cyanosis or clubbing.  Strength is normal and symmetrical in all extremities.  There is no lateralizing weakness.  There are no sensory deficits.  The skin is warm and dry.  There  is no rash.    Assessment / Plan: She's to continue same medication.  We are adding generic Restoril 15 mg h.s. P.r.n.  Check in 4 months for followup office visit and lab work

## 2011-06-23 NOTE — Assessment & Plan Note (Signed)
The patient has a past history of essential hypertension.  She is doing well on her current therapy of Ziac,Toprol-XL, and Maxide.  He has not been having any headaches or dizzy spells.

## 2011-06-26 ENCOUNTER — Telehealth: Payer: Self-pay | Admitting: *Deleted

## 2011-06-26 NOTE — Telephone Encounter (Signed)
Advised of labs 

## 2011-07-13 ENCOUNTER — Telehealth: Payer: Self-pay | Admitting: Cardiology

## 2011-07-13 NOTE — Telephone Encounter (Signed)
Mrs Lightle was calling about her husband.  Will document under her chart

## 2011-07-13 NOTE — Telephone Encounter (Signed)
FYI doing better. Saw Dr Despina Hick yesterday and he was very pleased. Advised to call back if needs anything

## 2011-07-13 NOTE — Telephone Encounter (Signed)
Left message

## 2011-07-13 NOTE — Telephone Encounter (Signed)
[name] states, Ray had surgery on Left leg, right leg was swollen and took lasix for 3 days.  Legs are looking better and swelling gone down.  She is on her way out, should be back at about 10:00.  Please call after 10:00.

## 2011-08-01 ENCOUNTER — Other Ambulatory Visit: Payer: Self-pay | Admitting: *Deleted

## 2011-08-01 DIAGNOSIS — K219 Gastro-esophageal reflux disease without esophagitis: Secondary | ICD-10-CM

## 2011-08-01 MED ORDER — PANTOPRAZOLE SODIUM 40 MG PO TBEC: 40.0000 mg | DELAYED_RELEASE_TABLET | Freq: Every day | ORAL | Status: DC

## 2011-08-01 NOTE — Telephone Encounter (Signed)
Refilled meds per fax request.  

## 2011-08-09 LAB — URINALYSIS, ROUTINE W REFLEX MICROSCOPIC
Bilirubin Urine: NEGATIVE
Nitrite: NEGATIVE
Specific Gravity, Urine: 1.024
Urobilinogen, UA: 0.2

## 2011-08-09 LAB — COMPREHENSIVE METABOLIC PANEL
Albumin: 3.4 — ABNORMAL LOW
BUN: 20
Calcium: 9.5
Creatinine, Ser: 0.99
Total Protein: 6.4

## 2011-08-09 LAB — URINE MICROSCOPIC-ADD ON

## 2011-08-09 LAB — CBC
MCV: 97.1
Platelets: 245
WBC: 8.6

## 2011-08-09 LAB — APTT: aPTT: 29

## 2011-08-09 LAB — PROTIME-INR: INR: 1

## 2011-08-10 LAB — BASIC METABOLIC PANEL
CO2: 30
Chloride: 101
Creatinine, Ser: 0.85
GFR calc Af Amer: >60
GFR calc non Af Amer: >60
Glucose, Bld: 132 — ABNORMAL HIGH
Potassium: 4
Sodium: 132 — ABNORMAL LOW
Sodium: 137

## 2011-08-10 LAB — CBC
HCT: 34.6 — ABNORMAL LOW
HCT: 38.5
HCT: 38.7
Hemoglobin: 11.8 — ABNORMAL LOW
Hemoglobin: 13.1
Hemoglobin: 13.3
MCHC: 34
MCHC: 34
MCV: 96.6
MCV: 97.4
RBC: 3.97
RDW: 13.9
RDW: 14.3

## 2011-08-10 LAB — TYPE AND SCREEN

## 2011-10-23 ENCOUNTER — Ambulatory Visit (INDEPENDENT_AMBULATORY_CARE_PROVIDER_SITE_OTHER): Payer: PRIVATE HEALTH INSURANCE | Admitting: Cardiology

## 2011-10-23 ENCOUNTER — Other Ambulatory Visit: Payer: PRIVATE HEALTH INSURANCE | Admitting: *Deleted

## 2011-10-23 ENCOUNTER — Encounter: Payer: Self-pay | Admitting: Cardiology

## 2011-10-23 VITALS — BP 136/80 | HR 60 | Ht 61.0 in | Wt 176.0 lb

## 2011-10-23 DIAGNOSIS — I119 Hypertensive heart disease without heart failure: Secondary | ICD-10-CM

## 2011-10-23 DIAGNOSIS — R002 Palpitations: Secondary | ICD-10-CM

## 2011-10-23 DIAGNOSIS — E039 Hypothyroidism, unspecified: Secondary | ICD-10-CM

## 2011-10-23 LAB — BASIC METABOLIC PANEL
BUN: 21 mg/dL (ref 6–23)
Calcium: 9 mg/dL (ref 8.4–10.5)
GFR: 51.81 mL/min — ABNORMAL LOW (ref >60.00)
Potassium: 3.9 mEq/L (ref 3.5–5.1)
Sodium: 140 mEq/L (ref 135–145)

## 2011-10-23 LAB — LIPID PANEL
LDL Cholesterol: 90 mg/dL (ref 0–99)
VLDL: 20.6 mg/dL (ref 0.0–40.0)

## 2011-10-23 LAB — HEPATIC FUNCTION PANEL
AST: 23 U/L (ref 0–37)
Alkaline Phosphatase: 86 U/L (ref 39–117)
Total Bilirubin: 0.6 mg/dL (ref 0.3–1.2)

## 2011-10-23 LAB — TSH: TSH: 0.44 u[IU]/mL (ref 0.35–5.50)

## 2011-10-23 NOTE — Progress Notes (Signed)
[name] Date of Birth:  06/16/1933 [facility] / [facility] [address]   [address] [place], [place]  [address] [phone]           Fax   [phone]  History of Present Illness: This pleasant 77 year old woman is seen for a scheduled followup office visit.  She has a history of essential hypertension, palpitations, and hypothyroidism.  His last visit she has been doing well.  She has now retired completely from hospital nursing.  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  Since we last saw her she got the flu shot and the shingles shot successfully.  Current Outpatient Prescriptions  Medication Sig Dispense Refill  . aspirin 81 MG tablet Take 81 mg by mouth daily.        . bisoprolol-hydrochlorothiazide (ZIAC) 10-6.25 MG per tablet TAKE ONE TABLET BY MOUTH EVERY DAY  90 tablet  3  . digoxin (LANOXIN) 0.125 MG tablet TAKE ONE TABLET BY MOUTH EVERY DAY  90 tablet  3  . levothyroxine (SYNTHROID, LEVOTHROID) 125 MCG tablet TAKE ONE TABLET BY MOUTH EVERY DAY  90 tablet  3  . metoprolol succinate (TOPROL-XL) 25 MG 24 hr tablet Take 25 mg by mouth daily.        . Multiple Vitamin (MULTIVITAMIN) tablet Take 1 tablet by mouth daily.        . pantoprazole (PROTONIX) 40 MG tablet Take 1 tablet (40 mg total) by mouth daily.  30 tablet  11  . triamterene-hydrochlorothiazide (MAXZIDE) 75-50 MG per tablet TAKE ONE-HALF TABLET BY MOUTH EVERY DAY  90 tablet  3    Allergies  Allergen Reactions  . Avapro (Irbesartan)   . Biaxin   . Hyzaar   . Sulfa Drugs Cross Reactors     Patient Active Problem List  Diagnoses  . Benign hypertensive heart disease without heart failure  . Palpitations  . Hypothyroidism  . GERD (gastroesophageal reflux disease)    History  Smoking status  . Never Smoker   Smokeless tobacco  . Not on file    History  Alcohol Use No    Family History  Problem Relation Age of Onset  .  Hypertension Mother   . Hypertension Father   . Heart attack Father     Review of Systems: Constitutional: no fever chills diaphoresis or fatigue or change in weight.  Head and neck: no hearing loss, no epistaxis, no photophobia or visual disturbance. Respiratory: No cough, shortness of breath or wheezing. Cardiovascular: No chest pain peripheral edema, palpitations. Gastrointestinal: No abdominal distention, no abdominal pain, no change in bowel habits hematochezia or melena. Genitourinary: No dysuria, no frequency, no urgency, no nocturia. Musculoskeletal:No arthralgias, no back pain, no gait disturbance or myalgias. Neurological: No dizziness, no headaches, no numbness, no seizures, no syncope, no weakness, no tremors. Hematologic: No lymphadenopathy, no easy bruising. Psychiatric: No confusion, no hallucinations, no sleep disturbance.    Physical Exam: Filed Vitals:   10/23/11 0945  BP: 136/80  Pulse: 60   general appearance reveals a well-developed well-nourished woman in no distress.The head and neck exam reveals pupils equal and reactive.  Extraocular movements are full.  There is no scleral icterus.  The mouth and pharynx are normal.  The neck is supple.  The carotids reveal no bruits.  The jugular venous pressure is normal.  The  thyroid is not enlarged.  There is no lymphadenopathy.  The chest is  clear to percussion and auscultation.  There are no rales or rhonchi.  Expansion of the chest is symmetrical.  The precordium is quiet.  The first heart sound is normal.  The second heart sound is physiologically split.  There is no murmur gallop rub or click.  There is no abnormal lift or heave.  The abdomen is soft and nontender.  The bowel sounds are normal.  The liver and spleen are not enlarged.  There are no abdominal masses.  There are no abdominal bruits.  Extremities reveal good pedal pulses.  There is no phlebitis or edema.  There is no cyanosis or clubbing.  Strength is normal  and symmetrical in all extremities.  There is no lateralizing weakness.  There are no sensory deficits.  The skin is warm and dry.  There is no rash.     Assessment / Plan: Continue same medication and return in 4 months for a followup office visit with lipid panel chemistries and TSH

## 2011-10-23 NOTE — Assessment & Plan Note (Signed)
The patient has felt well on her current therapy.  Is not aware of any racing of her heart or palpitations.

## 2011-10-23 NOTE — Assessment & Plan Note (Signed)
The patient is clinically euthyroid on her current therapy 

## 2011-10-23 NOTE — Assessment & Plan Note (Signed)
No headache.  No dizzy spell.  No shortness of breath.

## 2011-10-23 NOTE — Patient Instructions (Signed)
Your physician recommends that you continue on your current medications as directed. Please refer to the Current Medication list given to you today. Your physician wants you to follow-up in: 4 months You will receive a reminder letter in the mail two months in advance. If you don't receive a letter, please call our office to schedule the follow-up appointment.  

## 2011-10-25 ENCOUNTER — Telehealth: Payer: Self-pay | Admitting: *Deleted

## 2011-10-25 NOTE — Telephone Encounter (Signed)
Mailed copy of labs and left message to call if any questions  

## 2011-10-25 NOTE — Telephone Encounter (Signed)
Message copied by Burnell Blanks on Wed Oct 25, 2011  3:50 PM ------      Message from: Cassell Clement      Created: Mon Oct 23, 2011  9:07 PM       Please report.  The labs are stable.  Continue same meds.  Continue careful diet.

## 2011-12-11 ENCOUNTER — Other Ambulatory Visit: Payer: Self-pay | Admitting: Cardiology

## 2011-12-11 NOTE — Telephone Encounter (Signed)
Refilled metoprolol 

## 2011-12-13 ENCOUNTER — Ambulatory Visit (INDEPENDENT_AMBULATORY_CARE_PROVIDER_SITE_OTHER): Payer: PRIVATE HEALTH INSURANCE | Admitting: Cardiology

## 2011-12-13 ENCOUNTER — Ambulatory Visit
Admission: RE | Admit: 2011-12-13 | Discharge: 2011-12-13 | Disposition: A | Discharge status: Home / Self Care | Payer: PRIVATE HEALTH INSURANCE | Source: Ambulatory Visit | Attending: Cardiology | Admitting: Cardiology

## 2011-12-13 ENCOUNTER — Encounter: Payer: Self-pay | Admitting: Cardiology

## 2011-12-13 VITALS — BP 120/78 | HR 78 | Ht 61.0 in | Wt 171.0 lb

## 2011-12-13 DIAGNOSIS — E039 Hypothyroidism, unspecified: Secondary | ICD-10-CM

## 2011-12-13 DIAGNOSIS — I119 Hypertensive heart disease without heart failure: Secondary | ICD-10-CM

## 2011-12-13 DIAGNOSIS — R002 Palpitations: Secondary | ICD-10-CM

## 2011-12-13 DIAGNOSIS — R0609 Other forms of dyspnea: Secondary | ICD-10-CM

## 2011-12-13 DIAGNOSIS — R0989 Other specified symptoms and signs involving the circulatory and respiratory systems: Secondary | ICD-10-CM

## 2011-12-13 LAB — CBC WITH DIFFERENTIAL/PLATELET
Basophils Absolute: 0.1 10*3/uL (ref 0.0–0.1)
Basophils Relative: 1 % (ref 0.0–3.0)
HCT: 34.5 % — ABNORMAL LOW (ref 36.0–46.0)
Hemoglobin: 11.2 g/dL — ABNORMAL LOW (ref 12.0–15.0)
Lymphs Abs: 1.2 10*3/uL (ref 0.7–4.0)
Monocytes Relative: 12.9 % — ABNORMAL HIGH (ref 3.0–12.0)
Neutro Abs: 5.6 10*3/uL (ref 1.4–7.7)
RBC: 3.84 Mil/uL — ABNORMAL LOW (ref 3.87–5.11)
RDW: 14.6 % (ref 11.5–14.6)

## 2011-12-13 LAB — TROPONIN I: Troponin I: 0.3 ng/mL — ABNORMAL HIGH (ref <0.30)

## 2011-12-13 LAB — BASIC METABOLIC PANEL
BUN: 22 mg/dL (ref 6–23)
CO2: 28 mEq/L (ref 19–32)
Calcium: 9 mg/dL (ref 8.4–10.5)
Chloride: 103 mEq/L (ref 96–112)
Creatinine, Ser: 1.1 mg/dL (ref 0.4–1.2)
Glucose, Bld: 88 mg/dL (ref 70–99)

## 2011-12-13 LAB — CARDIAC PANEL: Total CK: 46 U/L (ref 7–177)

## 2011-12-13 NOTE — Assessment & Plan Note (Signed)
Her blood pressure has been stable on current therapy 

## 2011-12-13 NOTE — Patient Instructions (Addendum)
Will obtain labs today and call you with the results Will have you go for a chest xray today Your physician has requested that you have a lexiscan myoview. For further information please visit [email or web address] Please follow instruction sheet, as given. If symptoms worse, call back or go to the emergency room

## 2011-12-13 NOTE — Assessment & Plan Note (Signed)
She was not aware of any palpitations or racing of her heart during yesterday's episode.

## 2011-12-13 NOTE — Assessment & Plan Note (Signed)
She is clinically euthyroid 

## 2011-12-13 NOTE — Progress Notes (Signed)
[name] Date of Birth:  1932/11/29 [facility] [address] [place], [place]  [address] [phone]         Fax   [phone]  History of Present Illness: This pleasant 78 year old woman is seen as a work in an office visit.  She has a past history of frequent premature atrial beats and palpitations.  She also has a history of essential hypertension and a history of hypothyroidism.  She has not had an ischemic workup.  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  With exertion she developed severe shortness of breath.  She developed weakness and wheezing.  She had to sit down and rest for about 10 minutes before she could get up and go into the house.  During that time she did not have chest pain but both of her arms felt extremely weak.  Current Outpatient Prescriptions  Medication Sig Dispense Refill  . aspirin 81 MG tablet Take 81 mg by mouth daily.        . bisoprolol-hydrochlorothiazide (ZIAC) 10-6.25 MG per tablet TAKE ONE TABLET BY MOUTH EVERY DAY  90 tablet  3  . digoxin (LANOXIN) 0.125 MG tablet TAKE ONE TABLET BY MOUTH EVERY DAY  90 tablet  3  . levothyroxine (SYNTHROID, LEVOTHROID) 125 MCG tablet TAKE ONE TABLET BY MOUTH EVERY DAY  90 tablet  3  . metoprolol tartrate (LOPRESSOR) 25 MG tablet TAKE ONE TABLET BY MOUTH EVERY DAY  90 tablet  3  . Multiple Vitamin (MULTIVITAMIN) tablet Take 1 tablet by mouth daily.        . pantoprazole (PROTONIX) 40 MG tablet Take 1 tablet (40 mg total) by mouth daily.  30 tablet  11  . triamterene-hydrochlorothiazide (MAXZIDE) 75-50 MG per tablet TAKE ONE-HALF TABLET BY MOUTH EVERY DAY  90 tablet  3    Allergies  Allergen Reactions  . Avapro (Irbesartan)   . Biaxin   . Hyzaar   . Sulfa Drugs Cross Reactors     Patient Active Problem List  Diagnoses  . Benign hypertensive heart disease without heart failure  . Palpitations  . Hypothyroidism  .  GERD (gastroesophageal reflux disease)    History  Smoking status  . Never Smoker   Smokeless tobacco  . Not on file    History  Alcohol Use No    Family History  Problem Relation Age of Onset  . Hypertension Mother   . Hypertension Father   . Heart attack Father     Review of Systems: Constitutional: no fever chills diaphoresis or fatigue or change in weight.  Head and neck: no hearing loss, no epistaxis, no photophobia or visual disturbance. Respiratory: No cough, shortness of breath or wheezing. Cardiovascular: No chest pain peripheral edema, palpitations. Gastrointestinal: No abdominal distention, no abdominal pain, no change in bowel habits hematochezia or melena. Genitourinary: No dysuria, no frequency, no urgency, no nocturia. Musculoskeletal:No arthralgias, no back pain, no gait disturbance or myalgias. Neurological: No dizziness, no headaches, no numbness, no seizures, no syncope, no weakness, no tremors. Hematologic: No lymphadenopathy, no easy bruising. Psychiatric: No confusion, no hallucinations, no sleep disturbance.    Physical Exam: Filed Vitals:   12/13/11 0917  BP: 120/78  Pulse: 78   physical examination this is a well-developed well-nourished middle-age woman in no acute distress.Pupils equal and reactive.   Extraocular Movements are full.  There is no scleral icterus.  The mouth and  pharynx are normal.  The neck is supple.  The carotids reveal no bruits.  The jugular venous pressure is normal.  The thyroid is not enlarged.  There is no lymphadenopathy. The chest reveals a diffuse mild expiratory rhonchi and wheezing although she is not dyspneic or coughing today.The precordium is quiet.  The first heart sound is normal.  The second heart sound is physiologically split.  There is no murmur gallop rub or click.  There is no abnormal lift or heave.  The abdomen is soft and nontender. Bowel sounds are normal. The liver and spleen are not enlarged. There Are  no abdominal masses. There are no bruits.  The pedal pulses are good.  There is no phlebitis or edema.  There is no cyanosis or clubbing. Strength is normal and symmetrical in all extremities.  There is no lateralizing weakness.  There are no sensory deficits.  The skin is warm and dry.  There is no rash.  EKG today shows sinus bradycardia and ST-T wave changes consistent with digitalis effect.  The tracing is unchanged since 04/03/08.   Assessment / Plan: I am concerned that yesterday's episode may have been an ischemic anginal equivalent with extreme fatigue weakness and bilateral arm pain associated with shortness of breath.  We will pursue an ischemic workup with a walking Lexa scan Myoview stress test.  We will check lab work today including CBC basal metabolic panel B. natruretic peptide and cardiac panel.  We will get a chest x-ray today.  She will continue her current medication including daily aspirin.  She'll be rechecked at her regular appointment in April or sooner as needed.

## 2011-12-14 ENCOUNTER — Telehealth: Payer: Self-pay | Admitting: *Deleted

## 2011-12-14 ENCOUNTER — Telehealth: Payer: Self-pay | Admitting: Cardiology

## 2011-12-14 DIAGNOSIS — D649 Anemia, unspecified: Secondary | ICD-10-CM

## 2011-12-14 DIAGNOSIS — E876 Hypokalemia: Secondary | ICD-10-CM

## 2011-12-14 MED ORDER — FERROUS SULFATE 325 (65 FE) MG PO TABS: 325.0000 mg | ORAL_TABLET | Freq: Every day | ORAL | Status: DC

## 2011-12-14 MED ORDER — POTASSIUM CHLORIDE CRYS ER 20 MEQ PO TBCR: 20.0000 meq | EXTENDED_RELEASE_TABLET | Freq: Every day | ORAL | Status: DC | PRN

## 2011-12-14 NOTE — Telephone Encounter (Signed)
Advised of test results

## 2011-12-14 NOTE — Telephone Encounter (Signed)
Message copied by Burnell Blanks on Thu Dec 14, 2011  4:34 PM ------      Message from: Cassell Clement      Created: Thu Dec 14, 2011  9:31 AM       Please report.  She is more anemic.  Hgb 11.2.  Add FeSo4 325 one daily and recheck CBC in 2 weeks.   Get anemia panel when she returns for her Myoview.      Potassium is low 3.3.  Add Kdur 20 meq daily. Check BMET in 2 weeks. Her BNP is only slightly high. Continue HCTZ.      One of her cardiac enzymes is borderline high so she may have had a very slight amount of heart damage with the episode Tuesday.  The results of the myoview will be important.

## 2011-12-14 NOTE — Telephone Encounter (Signed)
Fu call °Patient returning your call °

## 2011-12-14 NOTE — Telephone Encounter (Signed)
Advised patient and mailed copy of labs

## 2011-12-14 NOTE — Telephone Encounter (Signed)
Message copied by Burnell Blanks on Thu Dec 14, 2011  4:31 PM ------      Message from: Cassell Clement      Created: Thu Dec 14, 2011  9:31 AM       Please report.  She is more anemic.  Hgb 11.2.  Add FeSo4 325 one daily and recheck CBC in 2 weeks.   Get anemia panel when she returns for her Myoview.      Potassium is low 3.3.  Add Kdur 20 meq daily. Check BMET in 2 weeks. Her BNP is only slightly high. Continue HCTZ.      One of her cardiac enzymes is borderline high so she may have had a very slight amount of heart damage with the episode Tuesday.  The results of the myoview will be important.

## 2011-12-14 NOTE — Telephone Encounter (Signed)
Message copied by Burnell Blanks on Thu Dec 14, 2011  4:28 PM ------      Message from: Cassell Clement      Created: Thu Dec 14, 2011  9:21 AM       Please report.  The chest xray is stable.  No pneumonia. Cardiomegaly is stable.

## 2011-12-21 ENCOUNTER — Other Ambulatory Visit (INDEPENDENT_AMBULATORY_CARE_PROVIDER_SITE_OTHER): Payer: PRIVATE HEALTH INSURANCE | Admitting: *Deleted

## 2011-12-21 ENCOUNTER — Ambulatory Visit (HOSPITAL_COMMUNITY): Payer: Medicare Other | Attending: Cardiovascular Disease | Admitting: Radiology

## 2011-12-21 DIAGNOSIS — E876 Hypokalemia: Secondary | ICD-10-CM

## 2011-12-21 DIAGNOSIS — R5381 Other malaise: Secondary | ICD-10-CM | POA: Insufficient documentation

## 2011-12-21 DIAGNOSIS — R0609 Other forms of dyspnea: Secondary | ICD-10-CM | POA: Insufficient documentation

## 2011-12-21 DIAGNOSIS — D649 Anemia, unspecified: Secondary | ICD-10-CM

## 2011-12-21 DIAGNOSIS — I119 Hypertensive heart disease without heart failure: Secondary | ICD-10-CM | POA: Insufficient documentation

## 2011-12-21 DIAGNOSIS — Z8249 Family history of ischemic heart disease and other diseases of the circulatory system: Secondary | ICD-10-CM | POA: Insufficient documentation

## 2011-12-21 DIAGNOSIS — R0989 Other specified symptoms and signs involving the circulatory and respiratory systems: Secondary | ICD-10-CM | POA: Insufficient documentation

## 2011-12-21 DIAGNOSIS — R002 Palpitations: Secondary | ICD-10-CM | POA: Insufficient documentation

## 2011-12-21 DIAGNOSIS — R0602 Shortness of breath: Secondary | ICD-10-CM

## 2011-12-21 LAB — CBC WITH DIFFERENTIAL/PLATELET
Basophils Relative: 0.9 % (ref 0.0–3.0)
Eosinophils Absolute: 0.2 10*3/uL (ref 0.0–0.7)
MCHC: 32.3 g/dL (ref 30.0–36.0)
MCV: 90.3 fl (ref 78.0–100.0)
Monocytes Absolute: 0.9 10*3/uL (ref 0.1–1.0)
Neutrophils Relative %: 64.8 % (ref 43.0–77.0)
Platelets: 340 10*3/uL (ref 150.0–400.0)
RBC: 4.04 Mil/uL (ref 3.87–5.11)

## 2011-12-21 LAB — BASIC METABOLIC PANEL
BUN: 21 mg/dL (ref 6–23)
Creatinine, Ser: 1.1 mg/dL (ref 0.4–1.2)
GFR: 52.91 mL/min — ABNORMAL LOW (ref >60.00)
Potassium: 4.4 mEq/L (ref 3.5–5.1)

## 2011-12-21 LAB — VITAMIN B12: Vitamin B-12: 489 pg/mL (ref 211–911)

## 2011-12-21 LAB — FOLATE: Folate: 19.7 ng/mL (ref >5.9)

## 2011-12-21 MED ORDER — REGADENOSON 0.4 MG/5ML IV SOLN: 0.4000 mg | Freq: Once | INTRAVENOUS | Status: DC

## 2011-12-21 MED ORDER — TECHNETIUM TC 99M TETROFOSMIN IV KIT: 30.0000 | PACK | Freq: Once | INTRAVENOUS | Status: DC | PRN

## 2011-12-21 MED ORDER — TECHNETIUM TC 99M TETROFOSMIN IV KIT: 10.0000 | PACK | Freq: Once | INTRAVENOUS | Status: DC | PRN

## 2011-12-21 NOTE — Progress Notes (Signed)
[facility] SITE 3 NUCLEAR MED [address] [place] [address] [phone]  Cardiology Nuclear Med Study  [name] is a 78 y.o. female [id number] 10/10/1933   Nuclear Med Background Indication for Stress Test:  Evaluation for Ischemia History: 2207  Echo EF 55-60% Normal Cardiac Risk Factors: Family History - CAD and Hypertension  Symptoms:  DOE, Fatigue and Palpitations   Nuclear Pre-Procedure Caffeine/Decaff Intake:  None NPO After: 7:00pm   Lungs:  Clear IV 0.9% NS with Angio Cath:  22g  IV Site: R Antecubital x 1, tolerated well IV Started by:  Irean Hong, RN  Chest Size (in):  38 Cup Size: B  Height: 5\' 1"  (1.549 m)  Weight:  168 lb (76.204 kg)  BMI:  Body mass index is 31.74 kg/(m^2). Tech Comments:  Held lopressor x 24 hrs    Nuclear Med Study 1 or 2 day study: 1 day  Stress Test Type:  Treadmill/Lexiscan  Reading MD: Charlton Haws, MD  Order Authorizing Provider:  Cassell Clement, MD  Resting Radionuclide: Technetium 51m Tetrofosmin  Resting Radionuclide Dose: 11.0 mCi   Stress Radionuclide:  Technetium 59m Tetrofosmin  Stress Radionuclide Dose: 33.0 mCi           Stress Protocol Rest HR: 60 Stress HR: 97  Rest BP: 139/70 Stress BP: 171/60  Exercise Time (min): 2:00 METS: 1.6   Predicted Max HR: 144 bpm % Max HR: 67.36 bpm Rate Pressure Product: 21308   Dose of Adenosine (mg):  n/a Dose of Lexiscan: 0.4 mg  Dose of Atropine (mg): n/a Dose of Dobutamine: n/a mcg/kg/min (at max HR)  Stress Test Technologist: Bonnita Levan, RN  Nuclear Technologist:  Domenic Polite, CNMT     Rest Procedure:  Myocardial perfusion imaging was performed at rest 45 minutes following the intravenous administration of Technetium 52m Tetrofosmin. Rest ECG: NSR  Stress Procedure:  The patient received IV Lexiscan 0.4 mg over 15-seconds with concurrent low level exercise and then Technetium 19m Tetrofosmin was injected at 30-seconds while the patient  continued walking one more minute. She denied any Chest Pain. NS ST-T changes with injection. Quantitative spect images were obtained after a 45-minute delay. Stress ECG: No significant change from baseline ECG  QPS Raw Data Images:  Normal; no motion artifact; normal heart/lung ratio. Stress Images:  Normal homogeneous uptake in all areas of the myocardium. Rest Images:  Normal homogeneous uptake in all areas of the myocardium. Subtraction (SDS):  Normal Transient Ischemic Dilatation (Normal <1.22):  0.96 Lung/Heart Ratio (Normal <0.45):  0.29  Quantitative Gated Spect Images QGS EDV:  65 ml QGS ESV:  11 ml QGS cine images:  NL LV Function; NL Wall Motion QGS EF: 83%  Impression Exercise Capacity:  Lexiscan with low level exercise. BP Response:  Normal blood pressure response. Clinical Symptoms:  No chest pain. ECG Impression:  No significant ST segment change suggestive of ischemia. Comparison with Prior Nuclear Study: No images to compare  Overall Impression:  Normal stress nuclear study.   Charlton Haws

## 2011-12-22 ENCOUNTER — Telehealth: Payer: Self-pay | Admitting: *Deleted

## 2011-12-22 NOTE — Telephone Encounter (Signed)
Message copied by Burnell Blanks on Fri Dec 22, 2011  5:10 PM ------      Message from: Cassell Clement      Created: Fri Dec 22, 2011  1:20 PM       Stress test was normal.  No evidence of ischemia.  Good LV function. CSD

## 2011-12-22 NOTE — Telephone Encounter (Signed)
Left message

## 2011-12-22 NOTE — Telephone Encounter (Signed)
Message copied by Burnell Blanks on Fri Dec 22, 2011  5:12 PM ------      Message from: Cassell Clement      Created: Fri Dec 22, 2011  1:19 PM       Hbg 11.8 slightly better.  B12 and folate level OK.  Ferritin at lower level of normal range so stay on iron therapy for the next several months and recheck CBC at next visit.      Potassium is back to normal

## 2011-12-22 NOTE — Telephone Encounter (Signed)
We do not need to see her that soon. Can probably wait for 2-3 month

## 2011-12-22 NOTE — Telephone Encounter (Signed)
Scheduled for CBC and BMET on 2/14 will forward to  Dr. Patty Sermons to see if she needs to follow that soon

## 2011-12-26 NOTE — Telephone Encounter (Signed)
Advised patient

## 2011-12-28 ENCOUNTER — Other Ambulatory Visit: Payer: PRIVATE HEALTH INSURANCE | Admitting: *Deleted

## 2012-01-05 ENCOUNTER — Other Ambulatory Visit: Payer: Self-pay | Admitting: *Deleted

## 2012-01-05 DIAGNOSIS — D649 Anemia, unspecified: Secondary | ICD-10-CM

## 2012-01-05 DIAGNOSIS — E876 Hypokalemia: Secondary | ICD-10-CM

## 2012-01-05 MED ORDER — POTASSIUM CHLORIDE CRYS ER 10 MEQ PO TBCR: EXTENDED_RELEASE_TABLET | ORAL | Status: DC

## 2012-01-05 NOTE — Progress Notes (Signed)
Patient wanted to change K+ secondary to size of pills.  Rx faxed back

## 2012-02-12 ENCOUNTER — Other Ambulatory Visit: Payer: Self-pay | Admitting: *Deleted

## 2012-02-12 DIAGNOSIS — E876 Hypokalemia: Secondary | ICD-10-CM

## 2012-02-12 MED ORDER — POTASSIUM CHLORIDE CRYS ER 10 MEQ PO TBCR: EXTENDED_RELEASE_TABLET | ORAL | Status: DC

## 2012-02-26 ENCOUNTER — Other Ambulatory Visit: Payer: Medicare Other

## 2012-02-26 ENCOUNTER — Encounter: Payer: Self-pay | Admitting: Cardiology

## 2012-02-26 ENCOUNTER — Ambulatory Visit (INDEPENDENT_AMBULATORY_CARE_PROVIDER_SITE_OTHER): Payer: Medicare Other | Admitting: Cardiology

## 2012-02-26 VITALS — BP 110/70 | HR 78 | Ht 61.0 in | Wt 168.0 lb

## 2012-02-26 DIAGNOSIS — I119 Hypertensive heart disease without heart failure: Secondary | ICD-10-CM

## 2012-02-26 DIAGNOSIS — D649 Anemia, unspecified: Secondary | ICD-10-CM

## 2012-02-26 DIAGNOSIS — E039 Hypothyroidism, unspecified: Secondary | ICD-10-CM

## 2012-02-26 DIAGNOSIS — E876 Hypokalemia: Secondary | ICD-10-CM

## 2012-02-26 DIAGNOSIS — J40 Bronchitis, not specified as acute or chronic: Secondary | ICD-10-CM

## 2012-02-26 DIAGNOSIS — R002 Palpitations: Secondary | ICD-10-CM

## 2012-02-26 DIAGNOSIS — K219 Gastro-esophageal reflux disease without esophagitis: Secondary | ICD-10-CM

## 2012-02-26 LAB — CBC WITH DIFFERENTIAL/PLATELET
Eosinophils Relative: 1.2 % (ref 0.0–5.0)
HCT: 34.8 % — ABNORMAL LOW (ref 36.0–46.0)
Lymphocytes Relative: 11.8 % — ABNORMAL LOW (ref 12.0–46.0)
Lymphs Abs: 1.3 10*3/uL (ref 0.7–4.0)
Monocytes Relative: 10.7 % (ref 3.0–12.0)
Neutrophils Relative %: 75.6 % (ref 43.0–77.0)
Platelets: 290 10*3/uL (ref 150.0–400.0)
WBC: 11.2 10*3/uL — ABNORMAL HIGH (ref 4.5–10.5)

## 2012-02-26 LAB — BASIC METABOLIC PANEL
BUN: 20 mg/dL (ref 6–23)
GFR: 51.76 mL/min — ABNORMAL LOW (ref >60.00)
Potassium: 3.6 mEq/L (ref 3.5–5.1)

## 2012-02-26 MED ORDER — AZITHROMYCIN 250 MG PO TABS: ORAL_TABLET | ORAL | Status: AC

## 2012-02-26 NOTE — Assessment & Plan Note (Signed)
The patient has not been aware of any recent palpitations or premature atrial beats.  She has had no dizziness or syncope.

## 2012-02-26 NOTE — Assessment & Plan Note (Signed)
Her blood pressure has been remaining stable on current therapy.  She continues to be short of breath when walking.  She has been more short of breath in the past week but has also had symptoms of sinusitis and bronchitis over the past week.

## 2012-02-26 NOTE — Assessment & Plan Note (Signed)
The patient has mild anemia with normal ferritin level.  He denies any hematochezia or melena.  We are rechecking a CBC today.  She is on low-dose over-the-counter iron therapy.

## 2012-02-26 NOTE — Progress Notes (Signed)
[name] Date of Birth:  [birth year] [facility] [address] [place], [place]  [address] [phone]  Fax   [phone]  HPI: This pleasant 78 year old woman is seen for a scheduled four-month followup office visit.  She has a past history of essential hypertension and a history of frequent premature atrial beats with palpitations.  She also has a history of hypothyroidism.  She has recently had a normal nuclear stress test for evaluation of shortness of breath.  Since last visit she has continued to have some shortness of breath as well as some recent symptoms of sinusitis and bronchitis.  The patient also has a history of mild anemia which is being followed.  She has not been experiencing any hematochezia or melena.  She has been on long-term proton pump inhibitor because of a history of GERD.  Current Outpatient Prescriptions  Medication Sig Dispense Refill  . aspirin 81 MG tablet Take 81 mg by mouth daily.        . bisoprolol-hydrochlorothiazide (ZIAC) 10-6.25 MG per tablet TAKE ONE TABLET BY MOUTH EVERY DAY  90 tablet  3  . digoxin (LANOXIN) 0.125 MG tablet TAKE ONE TABLET BY MOUTH EVERY DAY  90 tablet  3  . ferrous sulfate 325 (65 FE) MG tablet Take 1 tablet (325 mg total) by mouth daily with breakfast.  30 tablet  5  . levothyroxine (SYNTHROID, LEVOTHROID) 125 MCG tablet TAKE ONE TABLET BY MOUTH EVERY DAY  90 tablet  3  . metoprolol tartrate (LOPRESSOR) 25 MG tablet TAKE ONE TABLET BY MOUTH EVERY DAY  90 tablet  3  . pantoprazole (PROTONIX) 40 MG tablet Take 1 tablet (40 mg total) by mouth daily.  30 tablet  11  . potassium chloride (K-DUR,KLOR-CON) 10 MEQ tablet 2 daily  60 tablet  11  . triamterene-hydrochlorothiazide (MAXZIDE) 75-50 MG per tablet TAKE ONE-HALF TABLET BY MOUTH EVERY DAY  90 tablet  3  . azithromycin (ZITHROMAX) 250 MG tablet Take 2 tablets (500 mg) on  Day 1,  followed by 1 tablet (250 mg) once daily on Days 2 through 5.  6 each  0     Allergies  Allergen Reactions  . Avapro (Irbesartan)   . Biaxin   . Hyzaar   . Sulfa Drugs Cross Reactors     Patient Active Problem List  Diagnoses  . Benign hypertensive heart disease without heart failure  . Palpitations  . Hypothyroidism  . GERD (gastroesophageal reflux disease)    History  Smoking status  . Never Smoker   Smokeless tobacco  . Not on file    History  Alcohol Use No    Family History  Problem Relation Age of Onset  . Hypertension Mother   . Hypertension Father   . Heart attack Father     Review of Systems: The patient denies any heat or cold intolerance.  No weight gain or weight loss.  The patient denies headaches or blurry vision.  There is no cough or sputum production.  The patient denies dizziness.  There is no hematuria or hematochezia.  The patient denies any muscle aches or arthritis.  The patient denies any rash.  The patient denies frequent falling or instability.  There is no history of depression or anxiety.  All other systems were reviewed and are negative.   Physical Exam: Filed Vitals:   02/26/12 0911  BP: 110/70  Pulse: 78   the general appearance reveals a well-developed elderly woman in no distress.Pupils  equal and reactive.   Extraocular Movements are full.  There is no scleral icterus.  The mouth and pharynx are normal.  The neck is supple.  The carotids reveal no bruits.  The jugular venous pressure is normal.  The thyroid is not enlarged.  There is no lymphadenopathy.  She does have some tenderness over the left maxillary and frontal sinus area. The chest is clear to percussion and auscultation. There are no rales or rhonchi. Expansion of the chest is symmetrical.  The precordium is quiet.  The first heart sound is normal.  The second heart sound is physiologically split.  There is no murmur gallop rub or click.  There is no abnormal lift or heave.  The abdomen is soft and nontender. Bowel sounds are normal. The liver and  spleen are not enlarged. There Are no abdominal masses. There are no bruits.  The pedal pulses are good.  There is no phlebitis or edema.  There is no cyanosis or clubbing. Strength is normal and symmetrical in all extremities.  There is no lateralizing weakness.  There are no sensory deficits.        Assessment / Plan: The patient is to continue same medication and for her bronchitis and sinusitis we will add Mucinex and Zithromax Z-Pak.  Today we are checking a CBC and a basal metabolic panel.  She has a history of recent low potassium and is now on potassium supplementation. Recheck in 4 months for followup office visit CBC fasting lipid panel hepatic function panel and basal metabolic panel

## 2012-02-26 NOTE — Patient Instructions (Signed)
Z-Pak as directed.  Your physician wants you to follow-up in: 4 months with Dr. Patty Sermons.  You will receive a reminder letter in the mail two months in advance. If you don't receive a letter, please call our office to schedule the follow-up appointment.  Your physician recommends that you return for fasting lab work in: 4 months.  BMET and CBC today.

## 2012-02-26 NOTE — Assessment & Plan Note (Signed)
The patient is clinically euthyroid on current therapy 

## 2012-02-28 ENCOUNTER — Telehealth: Payer: Self-pay | Admitting: *Deleted

## 2012-02-28 DIAGNOSIS — D649 Anemia, unspecified: Secondary | ICD-10-CM

## 2012-02-28 NOTE — Telephone Encounter (Signed)
Message copied by Burnell Blanks on Wed Feb 28, 2012  1:29 PM ------      Message from: Cassell Clement      Created: Mon Feb 26, 2012  8:27 PM       BMET is normal.  K is borderline so continue KCL.      WBC is high from the sinusitis.      Hgb is about the same, 11.6.  Continue iron. Recheck CBC and Fe/TIBC at next OV

## 2012-02-28 NOTE — Telephone Encounter (Signed)
Advised of labs 

## 2012-03-07 ENCOUNTER — Other Ambulatory Visit: Payer: Self-pay | Admitting: Cardiology

## 2012-03-07 DIAGNOSIS — Z1231 Encounter for screening mammogram for malignant neoplasm of breast: Secondary | ICD-10-CM

## 2012-03-18 ENCOUNTER — Other Ambulatory Visit: Payer: Self-pay | Admitting: Cardiology

## 2012-03-18 ENCOUNTER — Ambulatory Visit
Admission: RE | Admit: 2012-03-18 | Discharge: 2012-03-18 | Disposition: A | Discharge status: Home / Self Care | Payer: Medicare Other | Source: Ambulatory Visit | Attending: Cardiology | Admitting: Cardiology

## 2012-03-18 DIAGNOSIS — Z1231 Encounter for screening mammogram for malignant neoplasm of breast: Secondary | ICD-10-CM

## 2012-03-18 NOTE — Telephone Encounter (Signed)
Refilled bisoprolol,digoxin,levothyroxine and triamterene

## 2012-04-02 ENCOUNTER — Telehealth: Payer: Self-pay | Admitting: Cardiology

## 2012-04-02 DIAGNOSIS — Z79899 Other long term (current) drug therapy: Secondary | ICD-10-CM

## 2012-04-02 NOTE — Telephone Encounter (Signed)
Would stop the Maxzide. Monitor blood pressures at home. Minimize salt. Recheck BMET in 2 weeks and review with Dr. Patty Sermons (I will be on vacation that week)

## 2012-04-02 NOTE — Telephone Encounter (Signed)
Advised patient and scheduled labs 

## 2012-04-02 NOTE — Telephone Encounter (Signed)
Pt calling to give you some labs results, call 1p

## 2012-04-02 NOTE — Telephone Encounter (Signed)
1)  Saw Dr Channing Mutters and they did labs for MRI with contrast, bun 37, cr 1.49, and GFR 34.  They did not do with contrast but patient was concerned about these numbers.  Patient is taking diuretics Ziac 10/6.25 mg daily and Maxzide 50 mg 1/2 daily.   2)  Also, patient has finished her first bottle of iron and actually feels better off of it than she did taking it.  Will forward to [facility] for review since  Dr. Patty Sermons out of the office

## 2012-04-16 ENCOUNTER — Other Ambulatory Visit (INDEPENDENT_AMBULATORY_CARE_PROVIDER_SITE_OTHER): Payer: Medicare Other

## 2012-04-16 DIAGNOSIS — Z79899 Other long term (current) drug therapy: Secondary | ICD-10-CM

## 2012-04-16 LAB — BASIC METABOLIC PANEL: Potassium: 3.7 mEq/L (ref 3.5–5.1)

## 2012-04-16 NOTE — Progress Notes (Signed)
Quick Note:  Please report to patient. The recent labs are stable. Continue same medication and careful diet. ______ 

## 2012-05-10 ENCOUNTER — Telehealth: Payer: Self-pay | Admitting: Cardiology

## 2012-05-10 NOTE — Telephone Encounter (Signed)
error 

## 2012-06-04 ENCOUNTER — Encounter: Payer: Self-pay | Admitting: Cardiology

## 2012-06-10 ENCOUNTER — Telehealth: Payer: Self-pay | Admitting: Cardiology

## 2012-06-10 NOTE — Telephone Encounter (Signed)
Pt advised to call PCP.  Pt agreed.

## 2012-06-10 NOTE — Telephone Encounter (Signed)
New msg Pt called to say that she has been itching and doesn't know why. Please call her back

## 2012-06-20 ENCOUNTER — Encounter (INDEPENDENT_AMBULATORY_CARE_PROVIDER_SITE_OTHER): Payer: Self-pay | Admitting: *Deleted

## 2012-06-20 ENCOUNTER — Ambulatory Visit (INDEPENDENT_AMBULATORY_CARE_PROVIDER_SITE_OTHER): Payer: Medicare Other | Admitting: Cardiology

## 2012-06-20 ENCOUNTER — Other Ambulatory Visit (INDEPENDENT_AMBULATORY_CARE_PROVIDER_SITE_OTHER): Payer: Medicare Other

## 2012-06-20 ENCOUNTER — Encounter: Payer: Self-pay | Admitting: Cardiology

## 2012-06-20 VITALS — BP 126/80 | HR 80 | Ht 61.0 in | Wt 161.0 lb

## 2012-06-20 DIAGNOSIS — I119 Hypertensive heart disease without heart failure: Secondary | ICD-10-CM

## 2012-06-20 DIAGNOSIS — E039 Hypothyroidism, unspecified: Secondary | ICD-10-CM

## 2012-06-20 DIAGNOSIS — D649 Anemia, unspecified: Secondary | ICD-10-CM

## 2012-06-20 DIAGNOSIS — E876 Hypokalemia: Secondary | ICD-10-CM

## 2012-06-20 DIAGNOSIS — J40 Bronchitis, not specified as acute or chronic: Secondary | ICD-10-CM

## 2012-06-20 DIAGNOSIS — R0989 Other specified symptoms and signs involving the circulatory and respiratory systems: Secondary | ICD-10-CM

## 2012-06-20 DIAGNOSIS — L299 Pruritus, unspecified: Secondary | ICD-10-CM | POA: Insufficient documentation

## 2012-06-20 LAB — BASIC METABOLIC PANEL
CO2: 28 mEq/L (ref 19–32)
Calcium: 9.4 mg/dL (ref 8.4–10.5)
Chloride: 102 mEq/L (ref 96–112)
Potassium: 4.2 mEq/L (ref 3.5–5.1)
Sodium: 138 mEq/L (ref 135–145)

## 2012-06-20 LAB — LIPID PANEL
HDL: 49.1 mg/dL (ref >39.00)
LDL Cholesterol: 84 mg/dL (ref 0–99)
Total CHOL/HDL Ratio: 3
Triglycerides: 84 mg/dL (ref 0.0–149.0)
VLDL: 16.8 mg/dL (ref 0.0–40.0)

## 2012-06-20 LAB — HEPATIC FUNCTION PANEL
AST: 24 U/L (ref 0–37)
Albumin: 3.3 g/dL — ABNORMAL LOW (ref 3.5–5.2)

## 2012-06-20 LAB — T4, FREE: Free T4: 1.22 ng/dL (ref 0.60–1.60)

## 2012-06-20 NOTE — Patient Instructions (Signed)
Will obtain labs today and call you with the results   Your physician recommends that you continue on your current medications as directed. Please refer to the Current Medication list given to you today.  Your physician recommends that you schedule a follow-up appointment in: 4 months with fasting labs (cbc/bmet/)

## 2012-06-20 NOTE — Assessment & Plan Note (Signed)
The patient has had severe pruritus worse at night.  Benadryl does not help.  She saw her dermatologist who gave her a cream which has helped some.  We are checking thyroid functions today to be sure that she is not hypothyroid to cause the pruritus.  She does have dry skin.  She does have known hypothyroidism and presently is on Synthroid 125 mcg daily.

## 2012-06-20 NOTE — Progress Notes (Signed)
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  She has a past history of angina hypertension and a history of frequent premature atrial beats.  She had a recent nuclear stress test earlier this year on 12/21/11 because of exertional dyspnea.  She was found to have no ischemia and her ejection fraction was 83% by Myoview.  Recently she's had an unexplained anemia.  She had extensive anemia panel by her primary care physician which showed that she actually has high serum iron levels.  She had a normal reticulocyte count.  Her white count and platelet count were normal.  Current Outpatient Prescriptions  Medication Sig Dispense Refill  . aspirin 81 MG tablet Take 81 mg by mouth daily.        . bisoprolol-hydrochlorothiazide (ZIAC) 10-6.25 MG per tablet TAKE ONE TABLET BY MOUTH EVERY DAY  90 tablet  3  . digoxin (LANOXIN) 0.125 MG tablet TAKE ONE TABLET BY MOUTH EVERY DAY  90 tablet  3  . levothyroxine (SYNTHROID, LEVOTHROID) 125 MCG tablet TAKE ONE TABLET BY MOUTH EVERY DAY  90 tablet  3  . metoprolol tartrate (LOPRESSOR) 25 MG tablet TAKE ONE TABLET BY MOUTH EVERY DAY  90 tablet  3  . pantoprazole (PROTONIX) 40 MG tablet Take 1 tablet (40 mg total) by mouth daily.  30 tablet  11  . potassium chloride (K-DUR,KLOR-CON) 10 MEQ tablet 2 daily  60 tablet  11    Allergies  Allergen Reactions  . Avapro (Irbesartan)   . Clarithromycin   . Losartan Potassium-Hctz   . Sulfa Drugs Cross Reactors     Patient Active Problem List  Diagnosis  . Benign hypertensive heart disease without heart failure  . Palpitations  . Hypothyroidism  . GERD (gastroesophageal reflux disease)    History  Smoking status  . Never Smoker   Smokeless tobacco  . Not on file     History  Alcohol Use No    Family History  Problem Relation Age of Onset  . Hypertension Mother   . Hypertension Father   . Heart attack Father     Review of Systems: Constitutional: no fever chills diaphoresis or fatigue or change in weight.  Head and neck: no hearing loss, no epistaxis, no photophobia or visual disturbance. Respiratory: No cough, shortness of breath or wheezing. Cardiovascular: No chest pain peripheral edema, palpitations. Gastrointestinal: No abdominal distention, no abdominal pain, no change in bowel habits hematochezia or melena. Genitourinary: No dysuria, no frequency, no urgency, no nocturia. Musculoskeletal:No arthralgias, no back pain, no gait disturbance or myalgias. Neurological: No dizziness, no headaches, no numbness, no seizures, no syncope, no weakness, no tremors. Hematologic: No lymphadenopathy, no easy bruising. Psychiatric: No confusion, no hallucinations, no sleep disturbance.    Physical Exam: Filed Vitals:   06/20/12 1127  BP: 126/80  Pulse: 80   the general appearance reveals a well-developed elderly woman in no distress.  She has lost 7 pounds since last visit.  She does have dry skin.The head and neck exam reveals pupils equal and reactive.  Extraocular movements are full.  There is no scleral icterus.  The mouth and pharynx are normal.  The neck is supple.  The carotids reveal no bruits.  The jugular venous pressure is  normal.  The  thyroid is not enlarged.  There is no lymphadenopathy.  The chest reveals dry crackling rales at the left base.  Expansion of the chest is symmetrical.  The precordium is quiet.  The first heart sound is normal.  The second heart sound is physiologically split.  There is no murmur gallop rub or click.  There is no abnormal lift or heave.  The abdomen is soft and nontender.  The bowel sounds are normal.  The liver and spleen are not enlarged.  There are no abdominal masses.  There are no abdominal bruits.   Extremities reveal good pedal pulses.  There is no phlebitis or edema.  There is no cyanosis or clubbing.  Strength is normal and symmetrical in all extremities.  There is no lateralizing weakness.  There are no sensory deficits.  The skin is warm and dry.  There is no rash.     Assessment / Plan:  Continue same medication.  Await results of today's lab work.  Her hematology consultation if her hemoglobin continues to fall.

## 2012-06-20 NOTE — Progress Notes (Signed)
Quick Note:  Please report to patient. The recent labs are stable. Continue same medication and careful diet. Thyroid function stable. ______

## 2012-06-20 NOTE — Assessment & Plan Note (Signed)
The patient has not been experiencing any recent cough or increased shortness of breath.  No chest pain.  She does have rales in the left chest but her recent chest x-ray in March was normal except for cardiomegaly

## 2012-06-21 ENCOUNTER — Telehealth: Payer: Self-pay | Admitting: *Deleted

## 2012-06-21 NOTE — Telephone Encounter (Signed)
Mailed copy of labs and left message to call if any questions  

## 2012-06-21 NOTE — Telephone Encounter (Signed)
Message copied by Burnell Blanks on Fri Jun 21, 2012  5:19 PM ------      Message from: Cassell Clement      Created: Thu Jun 20, 2012  8:28 PM       Please report to patient.  The recent labs are stable. Continue same medication and careful diet. Thyroid function stable.

## 2012-06-26 ENCOUNTER — Other Ambulatory Visit (INDEPENDENT_AMBULATORY_CARE_PROVIDER_SITE_OTHER): Payer: Self-pay | Admitting: *Deleted

## 2012-06-26 ENCOUNTER — Telehealth (INDEPENDENT_AMBULATORY_CARE_PROVIDER_SITE_OTHER): Payer: Self-pay | Admitting: *Deleted

## 2012-06-26 ENCOUNTER — Encounter (INDEPENDENT_AMBULATORY_CARE_PROVIDER_SITE_OTHER): Payer: Self-pay | Admitting: Internal Medicine

## 2012-06-26 ENCOUNTER — Ambulatory Visit (INDEPENDENT_AMBULATORY_CARE_PROVIDER_SITE_OTHER): Payer: Medicare Other | Admitting: Internal Medicine

## 2012-06-26 VITALS — BP 120/64 | HR 72 | Temp 97.9°F | Ht 61.0 in | Wt 163.3 lb

## 2012-06-26 DIAGNOSIS — D649 Anemia, unspecified: Secondary | ICD-10-CM

## 2012-06-26 DIAGNOSIS — Z1211 Encounter for screening for malignant neoplasm of colon: Secondary | ICD-10-CM

## 2012-06-26 NOTE — Telephone Encounter (Signed)
Patient needs movi prep 

## 2012-06-26 NOTE — Progress Notes (Signed)
Subjective:     Patient ID: [name], female   DOB: [birth year], 78 y.o.   MRN: [id number]  HPIReferred by Dr. Ouida Sills for anemia.  Stool negative in his office. Last was colonscopy was in 2000 and was normal.   Appetite is good. Weight loss intentional. No abdominal pain. BM x 1 a day.  Stools are brown in color. No melena or bright red rectal bleeding. Protonix controls her acid reflux. No dysphagia  06/04/2012 H and H 10.5 and 36.2, MCV 87.1, [place] ct 402. Total protein 6.9, Albumin 3.7, Glucose 77, BUN 24, Creatinine 1.18, Sodium 138, Chloride 105, C02 27, ALP 85, Total bili 0.3, ALT 17, AST 23, K 3.9. Review of Systems see hpi Current Outpatient Prescriptions  Medication Sig Dispense Refill  . aspirin 81 MG tablet Take 81 mg by mouth daily.        . bisoprolol-hydrochlorothiazide (ZIAC) 10-6.25 MG per tablet TAKE ONE TABLET BY MOUTH EVERY DAY  90 tablet  3  . digoxin (LANOXIN) 0.125 MG tablet TAKE ONE TABLET BY MOUTH EVERY DAY  90 tablet  3  . levothyroxine (SYNTHROID, LEVOTHROID) 125 MCG tablet TAKE ONE TABLET BY MOUTH EVERY DAY  90 tablet  3  . metoprolol tartrate (LOPRESSOR) 25 MG tablet TAKE ONE TABLET BY MOUTH EVERY DAY  90 tablet  3  . pantoprazole (PROTONIX) 40 MG tablet Take 1 tablet (40 mg total) by mouth daily.  30 tablet  11  . potassium chloride (K-DUR,KLOR-CON) 10 MEQ tablet 2 daily  60 tablet  11   Past Medical History  Diagnosis Date  . Hypertension   . Hypothyroidism   . Palpitations    Past Surgical History  Procedure Date  . Tonsillectomy and adenoidectomy   . US echocardiography 03/12/2006    EF 55-60%  . Knee replacement rt 3 yrs ago   . Back surger x 2   . Abdominal hysterectomy   . Shoulder surgery    Family Status  Relation Status Death Age  . Mother Deceased 58  . Father Deceased     age22  . Sister Alive     good health  . Brother Alive     good health, recently had heart surgery   History   Social History  . Marital Status: Married    Spouse Name: N/A    Number of Children: N/A  . Years of Education: N/A   Occupational History  . Not on file.   Social History Main Topics  . Smoking status: Never Smoker   . Smokeless tobacco: Not on file  . Alcohol Use: No  . Drug Use: No  . Sexually Active:    Other Topics Concern  . Not on file   Social History Narrative  . No narrative on file   Allergies  Allergen Reactions  . Avapro (Irbesartan)   . Clarithromycin   . Losartan Potassium-Hctz   . Sulfa Drugs Cross Reactors         Objective:   Physical Exam Filed Vitals:   06/26/12 1455  Height: 5\' 1"  (1.549 m)  Weight: 163 lb 4.8 oz (74.072 kg)   Alert and oriented. Skin warm and dry. Oral mucosa is moist.   . Sclera anicteric, conjunctivae is pink. Thyroid not enlarged. No cervical lymphadenopathy. Lungs clear. Heart regular rate and rhythm.  Abdomen is soft. Bowel sounds are positive. No hepatomegaly. No abdominal masses felt. No tenderness.  No edema to lower extremities.  Assessment:   Anemia.  Colonic neoplasm, AVM, polyp, diverticular needs to be ruled out.  If colonoscopy is normal: EGD to rule out PUD    Plan:   colonoscopy, if normal EGD.

## 2012-06-26 NOTE — Patient Instructions (Addendum)
Colonoscopy, if normal EGD.  The risks and benefits such as perforation, bleeding, and infection were reviewed with the patient and is agreeable.

## 2012-06-27 ENCOUNTER — Other Ambulatory Visit: Payer: Medicare Other

## 2012-06-27 ENCOUNTER — Ambulatory Visit: Payer: Medicare Other | Admitting: Cardiology

## 2012-06-28 MED ORDER — PEG-KCL-NACL-NASULF-NA ASC-C 100 G PO SOLR: 1.0000 | Freq: Once | ORAL | Status: DC

## 2012-07-08 ENCOUNTER — Encounter (HOSPITAL_COMMUNITY): Payer: Self-pay | Admitting: Pharmacy Technician

## 2012-07-09 ENCOUNTER — Encounter (INDEPENDENT_AMBULATORY_CARE_PROVIDER_SITE_OTHER): Payer: Self-pay

## 2012-07-24 ENCOUNTER — Encounter (HOSPITAL_COMMUNITY): Payer: Self-pay | Admitting: *Deleted

## 2012-07-24 ENCOUNTER — Encounter (HOSPITAL_COMMUNITY)
Admission: RE | Disposition: A | Discharge status: Home / Self Care | Payer: Self-pay | Source: Ambulatory Visit | Attending: Internal Medicine

## 2012-07-24 ENCOUNTER — Ambulatory Visit (HOSPITAL_COMMUNITY)
Admission: RE | Admit: 2012-07-24 | Discharge: 2012-07-24 | Disposition: A | Discharge status: Home / Self Care | Payer: Medicare Other | Source: Ambulatory Visit | Attending: Internal Medicine | Admitting: Internal Medicine

## 2012-07-24 DIAGNOSIS — D126 Benign neoplasm of colon, unspecified: Secondary | ICD-10-CM | POA: Insufficient documentation

## 2012-07-24 DIAGNOSIS — Z1211 Encounter for screening for malignant neoplasm of colon: Secondary | ICD-10-CM | POA: Insufficient documentation

## 2012-07-24 DIAGNOSIS — I10 Essential (primary) hypertension: Secondary | ICD-10-CM | POA: Insufficient documentation

## 2012-07-24 DIAGNOSIS — D649 Anemia, unspecified: Secondary | ICD-10-CM

## 2012-07-24 HISTORY — DX: Anemia, unspecified: D64.9

## 2012-07-24 HISTORY — PX: COLONOSCOPY: SHX5424

## 2012-07-24 SURGERY — COLONOSCOPY
Anesthesia: Moderate Sedation

## 2012-07-24 MED ORDER — SODIUM CHLORIDE 0.45 % IV SOLN
INTRAVENOUS | Status: DC
  Administered 2012-07-24: 13:00:00 via INTRAVENOUS

## 2012-07-24 MED ORDER — MIDAZOLAM HCL 5 MG/5ML IJ SOLN
INTRAMUSCULAR | Status: DC | PRN
  Administered 2012-07-24: 2 mg via INTRAVENOUS
  Administered 2012-07-24: 1 mg via INTRAVENOUS

## 2012-07-24 MED ORDER — MEPERIDINE HCL 50 MG/ML IJ SOLN
INTRAMUSCULAR | Status: AC
  Filled 2012-07-24: qty 1

## 2012-07-24 MED ORDER — STERILE WATER FOR IRRIGATION IR SOLN
Status: DC | PRN
  Administered 2012-07-24: 14:00:00

## 2012-07-24 MED ORDER — MEPERIDINE HCL 25 MG/ML IJ SOLN
INTRAMUSCULAR | Status: DC | PRN
  Administered 2012-07-24 (×2): 25 mg via INTRAVENOUS

## 2012-07-24 MED ORDER — MIDAZOLAM HCL 5 MG/5ML IJ SOLN
INTRAMUSCULAR | Status: AC
  Filled 2012-07-24: qty 10

## 2012-07-24 NOTE — Op Note (Signed)
COLONOSCOPY PROCEDURE REPORT  PATIENT:  [name]  MR#:  [id number] Birthdate:  [birth year], 78 y.o., female Endoscopist:  Dr. Malissa Hippo, MD Referred By:  Dr. Carylon Perches, MD Procedure Date: 07/24/2012  Procedure:   Colonoscopy  Indications:  Patient is 78 year old Caucasian female who is undergoing colonoscopy primarily for screening purposes. She was recently found to be in manic however there is no history of melena rectal bleeding in her stool is guaiac-negative and iron studies not consistent with IDA. Patient's last colonoscopy was 15 years ago.  Informed Consent:  The procedure and risks were reviewed with the patient and informed consent was obtained.  Medications:  Demerol 50 mg IV Versed 3 mg IV  Description of procedure:  After a digital rectal exam was performed, that colonoscope was advanced from the anus through the rectum and colon to the area of the cecum, ileocecal valve and appendiceal orifice. The cecum was deeply intubated. These structures were well-seen and photographed for the record. From the level of the cecum and ileocecal valve, the scope was slowly and cautiously withdrawn. The mucosal surfaces were carefully surveyed utilizing scope tip to flexion to facilitate fold flattening as needed. The scope was pulled down into the rectum where a thorough exam including retroflexion was performed. Terminal ileum was also examined.  Findings:   Prep excellent. Normal terminal ileal mucosa. Few areas at cecum and proximal ascending colon covered with specks of blood but no erosions or arteriovenous malformations noted. 3 mm polyp ablated via cold biopsy from distal sigmoid colon. Normal rectal mucosa and anal rectal junction.  Therapeutic/Diagnostic Maneuvers Performed:  See above  Complications:  None  Cecal Withdrawal Time:  9 minutes  Impression:  Normal terminal ileum. Few areas at cecum and ascending colon covered with tiny specks of blood but no  underlying mucosal abnormality noted. These changes may be related to aspirin use. Small polyp ablated via cold biopsy from sigmoid colon.  Recommendations:  Standard instructions given. I will contact patient with results of biopsy and further recommendations.  Fredick Schlosser U  07/24/2012 2:36 PM  CC: Dr. Carylon Perches, MD & Dr. Bonnetta Barry ref. provider found

## 2012-07-24 NOTE — H&P (Signed)
[name] is an 78 y.o. female.   Chief Complaint: Patient is here for colonoscopy. HPI: Patient is 78 year old Caucasian female, and was recently found to have anemia. There is no history of melena or rectal bleeding. Stool is guaiac negative. She also denies abdominal pain nausea vomiting or heartburn. She is on low-dose aspirin but does not take any NSAIDs. Her anemia was further evaluated with iron studies and B12 and folate. He 12 and folate levels are normal. Serum iron and saturation or higher but normal serum ferritin. Patient is undergoing colonoscopy primarily for screening purposes. His last colonoscopy was 13 years ago to Family history is negative for colorectal carcinoma.  Past Medical History  Diagnosis Date  . Hypertension   . Hypothyroidism   . Palpitations   . Anemia     Past Surgical History  Procedure Date  . Tonsillectomy and adenoidectomy   . US echocardiography 03/12/2006    EF 55-60%  . Knee replacement rt 3 yrs ago   . Back surger x 2   . Abdominal hysterectomy   . Shoulder surgery     Family History  Problem Relation Age of Onset  . Hypertension Mother   . Hypertension Father   . Heart attack Father    Social History:  reports that she has never smoked. She does not have any smokeless tobacco history on file. She reports that she does not drink alcohol or use illicit drugs.  Allergies:  Allergies  Allergen Reactions  . Avapro (Irbesartan) Other (See Comments)    Cough   . Clarithromycin Other (See Comments)    Unknown   . Losartan Potassium-Hctz Other (See Comments)    Unknown  . Penicillins Rash  . Sulfa Drugs Cross Reactors Rash    Medications Prior to Admission  Medication Sig Dispense Refill  . bisoprolol-hydrochlorothiazide (ZIAC) 10-6.25 MG per tablet Take 1 tablet by mouth daily.      . digoxin (LANOXIN) 0.125 MG tablet Take 0.125 mg by mouth daily.      Marland Kitchen levothyroxine (SYNTHROID, LEVOTHROID) 125 MCG tablet Take 125 mcg by mouth  daily.      . metoprolol tartrate (LOPRESSOR) 25 MG tablet Take 25 mg by mouth daily.      . pantoprazole (PROTONIX) 40 MG tablet Take 1 tablet (40 mg total) by mouth daily.  30 tablet  11  . peg 3350 powder (MOVIPREP) 100 G SOLR Take 1 kit (100 g total) by mouth once.  1 kit  0  . potassium chloride (K-DUR,KLOR-CON) 10 MEQ tablet Take 20 mEq by mouth daily. 2 daily      . aspirin 81 MG tablet Take 81 mg by mouth daily.          No results found for this or any previous visit (from the past 48 hour(s)). No results found.  ROS  Blood pressure 152/75, pulse 85, temperature 97.4 F (36.3 C), temperature source Oral, resp. rate 26, height 5\' 1"  (1.549 m), weight 163 lb (73.936 kg), SpO2 92.00%. Physical Exam  Constitutional: She appears well-developed and well-nourished.  HENT:  Mouth/Throat: Oropharynx is clear and moist.  Eyes: Conjunctivae normal are normal. No scleral icterus.  Neck: No thyromegaly present.  Cardiovascular: Normal rate, regular rhythm and normal heart sounds.   No murmur heard. Respiratory: Effort normal and breath sounds normal.  GI: Soft. She exhibits no distension and no mass. There is no tenderness.  Musculoskeletal: She exhibits no edema.  Lymphadenopathy:    She has no  cervical adenopathy.  Neurological: She is alert.  Skin: Skin is warm and dry.     Assessment/Plan Colonoscopy primarily for screening purposes. Anemia without evidence of GI bleed on iron deficiency.  Jacqulin Brandenburger U 07/24/2012, 2:00 PM

## 2012-07-26 ENCOUNTER — Encounter (HOSPITAL_COMMUNITY): Payer: Self-pay | Admitting: Internal Medicine

## 2012-07-29 ENCOUNTER — Other Ambulatory Visit: Payer: Self-pay | Admitting: *Deleted

## 2012-07-29 DIAGNOSIS — G4700 Insomnia, unspecified: Secondary | ICD-10-CM

## 2012-07-29 NOTE — Telephone Encounter (Signed)
Refill on temazepam with 1 extra refill

## 2012-07-31 ENCOUNTER — Encounter (INDEPENDENT_AMBULATORY_CARE_PROVIDER_SITE_OTHER): Payer: Self-pay | Admitting: *Deleted

## 2012-08-02 ENCOUNTER — Other Ambulatory Visit (HOSPITAL_COMMUNITY): Payer: Self-pay | Admitting: Internal Medicine

## 2012-08-02 ENCOUNTER — Ambulatory Visit (HOSPITAL_COMMUNITY)
Admission: RE | Admit: 2012-08-02 | Discharge: 2012-08-02 | Disposition: A | Discharge status: Home / Self Care | Payer: Medicare Other | Source: Ambulatory Visit | Attending: Internal Medicine | Admitting: Internal Medicine

## 2012-08-02 DIAGNOSIS — R0602 Shortness of breath: Secondary | ICD-10-CM

## 2012-08-04 MED ORDER — TEMAZEPAM 15 MG PO CAPS: 15.0000 mg | ORAL_CAPSULE | Freq: Every evening | ORAL | Status: DC | PRN

## 2012-08-05 ENCOUNTER — Telehealth: Payer: Self-pay | Admitting: Cardiology

## 2012-08-05 NOTE — Telephone Encounter (Signed)
On Sunday of last week started having increased shortness of breath and just seemed to get worse.   Ok sitting down but walking to mailbox and back "completely give out".  Saw PCP on Friday and MD heard crackles. Had Xray done and has not heard from them. O2 sat 90% at visit and has been doing breathing treatments 3 times a day since.  Heart rate down to 60 yesterday and didn't take her digoxin.  Heart rate today 71-72 and is feeling a little better. Denies swelling in feet or legs. Will forward to  Dr. Patty Sermons for review

## 2012-08-05 NOTE — Telephone Encounter (Signed)
New problem:  C/o sob over the weekend. Daughter advise patient to stop taken digoxin. Would like to be seen today.

## 2012-08-05 NOTE — Telephone Encounter (Signed)
Discussed Lanoxin with Dawayne Patricia and will have her hold until ov Wednesday, advised patient of this and appointment

## 2012-08-05 NOTE — Telephone Encounter (Signed)
We should try to see soon for OV, EKG, CBC and BMET

## 2012-08-07 ENCOUNTER — Encounter: Payer: Self-pay | Admitting: Cardiology

## 2012-08-07 ENCOUNTER — Ambulatory Visit (INDEPENDENT_AMBULATORY_CARE_PROVIDER_SITE_OTHER): Payer: Medicare Other | Admitting: Cardiology

## 2012-08-07 VITALS — BP 124/72 | HR 63 | Ht 61.0 in | Wt 157.8 lb

## 2012-08-07 DIAGNOSIS — R002 Palpitations: Secondary | ICD-10-CM

## 2012-08-07 DIAGNOSIS — R5381 Other malaise: Secondary | ICD-10-CM

## 2012-08-07 DIAGNOSIS — R5383 Other fatigue: Secondary | ICD-10-CM

## 2012-08-07 DIAGNOSIS — I119 Hypertensive heart disease without heart failure: Secondary | ICD-10-CM

## 2012-08-07 DIAGNOSIS — E039 Hypothyroidism, unspecified: Secondary | ICD-10-CM

## 2012-08-07 NOTE — Assessment & Plan Note (Signed)
Patient remains clinically euthyroid on current therapy.  Her malaise and fatigue are most likely secondary to her unexplained anemia which is being evaluated by Dr. Ouida Sills with future referral to Dr. Laurie Panda pending

## 2012-08-07 NOTE — Patient Instructions (Signed)
Continue off your Digoxin   Keep your December appointment

## 2012-08-07 NOTE — Progress Notes (Signed)
[name] Date of Birth:  [birth year] [facility] 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 She has a past history of angina hypertension and a history of frequent premature atrial beats. She had a recent nuclear stress test earlier this year on 12/21/11 because of exertional dyspnea. She was found to have no ischemia and her ejection fraction was 83% by Myoview. Recently she's had an unexplained anemia. She had extensive anemia panel by her primary care physician which showed that she actually has high serum iron levels. She had a normal reticulocyte count. Her white count and platelet count were normal.  She has an appointment to see Dr. Laurie Panda for hematology consult on October 8.  She has been experiencing pica for ice.  She saw her family doctor last Friday for increasing dyspnea and was found to have an oxygen saturation of 90%.  A chest x-ray done at that time was unremarkable.  It was felt that some of her symptoms could be related to her digoxin which has been stopped and she states that she does feel a little better since stopping the digoxin.  She had been on it for frequent symptomatic premature atrial beats.  These have not recurred.   Current Outpatient Prescriptions  Medication Sig Dispense Refill  . aspirin 81 MG tablet Take 81 mg by mouth daily.        . bisoprolol-hydrochlorothiazide (ZIAC) 10-6.25 MG per tablet Take 1 tablet by mouth daily.      Marland Kitchen levothyroxine (SYNTHROID, LEVOTHROID) 125 MCG tablet Take 125 mcg by mouth daily.      . metoprolol tartrate (LOPRESSOR) 25 MG tablet Take 25 mg by mouth daily.      . Multiple Vitamins-Minerals (CENTRUM SILVER PO) Take by mouth as needed.      . pantoprazole (PROTONIX) 40 MG tablet Take 1 tablet (40 mg total) by mouth daily.  30 tablet  11  . potassium chloride (K-DUR,KLOR-CON) 10 MEQ tablet Take  20 mEq by mouth daily.       . temazepam (RESTORIL) 15 MG capsule Take 1 capsule (15 mg total) by mouth at bedtime as needed for sleep.  30 capsule  1  . DISCONTD: temazepam (RESTORIL) 15 MG capsule Take 1 capsule (15 mg total) by mouth at bedtime as needed for sleep.  30 capsule  2    Allergies  Allergen Reactions  . Avapro (Irbesartan) Other (See Comments)    Cough   . Clarithromycin Other (See Comments)    Unknown   . Losartan Potassium-Hctz Other (See Comments)    Unknown  . Penicillins Rash  . Sulfa Drugs Cross Reactors Rash    Patient Active Problem List  Diagnosis  . Benign hypertensive heart disease without heart failure  . Palpitations  . Hypothyroidism  . GERD (gastroesophageal reflux disease)  . Pruritus  . Anemia    History  Smoking status  . Never Smoker   Smokeless tobacco  . Not on file    History  Alcohol Use No    Family History  Problem Relation Age of Onset  . Hypertension Mother   . Hypertension Father   . Heart attack Father     Review of Systems: The patient denies any heat or cold intolerance.  No weight gain or weight loss.  The patient denies headaches or blurry vision.  There is no  cough or sputum production.  The patient denies dizziness.  There is no hematuria or hematochezia.  The patient denies any muscle aches or arthritis.  The patient denies any rash.  The patient denies frequent falling or instability.  There is no history of depression or anxiety.  All other systems were reviewed and are negative.   Physical Exam: Filed Vitals:   08/07/12 0946  BP: 124/72  Pulse: 63   general appearance reveals a well-developed well-nourished elderly woman in no distress.The head and neck exam reveals pupils equal and reactive.  Extraocular movements are full.  There is no scleral icterus.  The mouth and pharynx are normal.  The neck is supple.  The carotids reveal no bruits.  The jugular venous pressure is normal.  The  thyroid is not enlarged.   There is no lymphadenopathy.  The chest is clear to percussion and auscultation.  There are no rales or rhonchi.  Expansion of the chest is symmetrical.  The precordium is quiet.  The first heart sound is normal.  The second heart sound is physiologically split.  There is no murmur gallop rub or click.  There is no abnormal lift or heave.  The abdomen is soft and nontender.  The bowel sounds are normal.  The liver and spleen are not enlarged.  There are no abdominal masses.  There are no abdominal bruits.  Extremities reveal good pedal pulses.  There is no phlebitis or edema.  There is no cyanosis or clubbing.  Strength is normal and symmetrical in all extremities.  There is no lateralizing weakness.  There are no sensory deficits.  The skin is warm and dry.  There is no rash.  EKG today shows sinus rhythm and ST segment abnormality consistent with digitalis effect.  She has been off digoxin for 3 days now    Assessment / Plan: Continue off digoxin.  Continue other medicines unchanged.  She will be rechecked in her previously scheduled visit next month

## 2012-08-07 NOTE — Assessment & Plan Note (Signed)
Her digoxin has now been stopped.  So far she has not noticed any recurrence of her palpitations or her premature atrial beats

## 2012-08-07 NOTE — Assessment & Plan Note (Signed)
Blood pressure has been staying normal on current therapy.  She is on Lopressor and Ziac

## 2012-08-12 ENCOUNTER — Telehealth: Payer: Self-pay | Admitting: Cardiology

## 2012-08-12 NOTE — Telephone Encounter (Signed)
Concerned that Ziac is causing her cough, itching, and lowering Hgb. Patient is allergic to sulfa and PCN. Will forward to  Dr. Patty Sermons for review

## 2012-08-12 NOTE — Telephone Encounter (Signed)
Advised patient and she will let us know if continues to have problems

## 2012-08-12 NOTE — Telephone Encounter (Signed)
Leave off the ziac and continue with the lopressor.  If her heart speeds up without the ziac she can increase her lopressor to 25 mg BID. See if the symptoms she is having resolve off the ziac.

## 2012-08-12 NOTE — Telephone Encounter (Signed)
Pt call ing re questions on her ziac med

## 2012-08-20 ENCOUNTER — Encounter (HOSPITAL_COMMUNITY): Payer: Self-pay | Admitting: Oncology

## 2012-08-20 ENCOUNTER — Encounter (HOSPITAL_COMMUNITY): Payer: Medicare Other | Attending: Oncology | Admitting: Oncology

## 2012-08-20 VITALS — BP 177/84 | HR 75 | Temp 97.7°F | Resp 18 | Ht 61.0 in | Wt 159.4 lb

## 2012-08-20 DIAGNOSIS — I519 Heart disease, unspecified: Secondary | ICD-10-CM | POA: Insufficient documentation

## 2012-08-20 DIAGNOSIS — R0989 Other specified symptoms and signs involving the circulatory and respiratory systems: Secondary | ICD-10-CM | POA: Insufficient documentation

## 2012-08-20 DIAGNOSIS — D649 Anemia, unspecified: Secondary | ICD-10-CM | POA: Insufficient documentation

## 2012-08-20 DIAGNOSIS — I2789 Other specified pulmonary heart diseases: Secondary | ICD-10-CM | POA: Insufficient documentation

## 2012-08-20 DIAGNOSIS — J40 Bronchitis, not specified as acute or chronic: Secondary | ICD-10-CM

## 2012-08-20 LAB — BASIC METABOLIC PANEL
BUN: 22 mg/dL (ref 6–23)
CO2: 24 mEq/L (ref 19–32)
Chloride: 102 mEq/L (ref 96–112)
Creatinine, Ser: 0.98 mg/dL (ref 0.50–1.10)
Glucose, Bld: 79 mg/dL (ref 70–99)

## 2012-08-20 LAB — CBC WITH DIFFERENTIAL/PLATELET
Hemoglobin: 9 g/dL — ABNORMAL LOW (ref 12.0–15.0)
Lymphocytes Relative: 22 % (ref 12–46)
Lymphs Abs: 1.8 10*3/uL (ref 0.7–4.0)
MCV: 74 fL — ABNORMAL LOW (ref 78.0–100.0)
Neutrophils Relative %: 61 % (ref 43–77)
Platelets: 454 10*3/uL — ABNORMAL HIGH (ref 150–400)
RBC: 4.12 MIL/uL (ref 3.87–5.11)
WBC: 7.9 10*3/uL (ref 4.0–10.5)

## 2012-08-20 LAB — RETICULOCYTES
RBC.: 4.12 MIL/uL (ref 3.87–5.11)
Retic Ct Pct: 2.1 % (ref 0.4–3.1)

## 2012-08-20 LAB — SEDIMENTATION RATE: Sed Rate: 13 mm/hr (ref 0–22)

## 2012-08-20 NOTE — Progress Notes (Signed)
Problem #1 normocytic anemia unclear as to etiology was slightly confused and iron studies with a high serum iron but also a high TIBC, high percent saturation but normal ferritin. Problem #2 diffuse pulmonary rales suspicious for intrinsic lung disease Problem #3 history of heart disease with premature contractions followed by Dr. April Holding Problem #4 possible digitalis toxicity having stopped the drug 2-1/2 weeks ago and she is feeling better she states. 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 She ran hemoglobins in the 12-14 g range she states for many years until she was found recently this year to be in the 10.4-11.8 g range. She had a workup including colonoscopy by Dr. Karilyn Cota which was negative. She had numerous blood studies by Dr. Ouida Sills the above or mentioned is slightly confusing namely her serum iron studies. Her ferritin was 44 in February of this year now his prostate 26 and that is after 3 months of oral iron daily total of 100 pills.  She does not have a sore tongue but she does like to eat ice but her husband states that she has 8 and ice for several years. She does not crave that however. She has not had changes in her nails. She does not have trouble swallowing.  Past medical history is as mentioned above. She and her husband have been married many years. They have 5 children lost one son at age 56 from trauma. Her other 4 children are alive and in good health to the best of her knowledge. Vital signs are recorded in the chart she is a very pleasant alert oriented woman in no acute distress slightly overweight for her height. She states she weighed 176 pounds 2-3 years ago but has gradually lost some weight. She has lost no  more than 10 pounds in the last 12 months. She has not had fevers chills or night sweats but she has not been sleeping as well the last 12 months. She occasionally uses temazepam when necessary.  She has no lymphadenopathy. Her lungs show diffuse rales both anteriorly posteriorly and laterally. There present superiorly as well as inferiorly. Her heart shows a regular rhythm and rate without distinct murmur or gallop at this time abdomen shows no hepatosplenomegaly. Bowel sounds are normal. Breast exam is negative for masses. She has no leg edema no arm edema. HEENT exam shows eye changes of cataract surgery bilaterally. Tongue is normal but dry but in the midline. Throat is clear. Facial symmetry is intact. She is alert and oriented. She moves all extremities very normally. She has no nail changes. She was not a smoker not a drinker.   I think she needs a CT scan of her chest to evaluate these rales. I need a few other blood tests and we'll see her back in 4 weeks or less.

## 2012-08-20 NOTE — Progress Notes (Signed)
Addendum: It is of note that this lady has had 5 antibiotics since February 2012 consisting of a Z-Pak in February 2012 followed by [facility] in that same month. She then had a Z-Pak in November 2012 followed by doxycycline in February of this year followed by Z-Pak again in April of this year.

## 2012-08-20 NOTE — Progress Notes (Signed)
[name] presented for labwork. Labs per MD order drawn via Peripheral Line 23 gauge needle inserted in right AC  Good blood return present. Procedure without incident.  Needle removed intact. Patient tolerated procedure well.   

## 2012-08-20 NOTE — Patient Instructions (Addendum)
[facility]  Discharge Instructions  RECOMMENDATIONS MADE BY THE CONSULTANT AND ANY TEST RESULTS WILL BE SENT TO YOUR REFERRING DOCTOR.   EXAM FINDINGS BY MD TODAY AND SIGNS AND SYMPTOMS TO REPORT TO CLINIC OR PRIMARY MD: exam and discussion per Dr. Mariel Sleet.   Need to do CT of your chest to see if we can find cause for the rales heard.  Will check some blood work as well.  As we get results in we will be in touch with you.  Any questions you can call us.  Tobie Lords [phone]).   MEDICATIONS PRESCRIBED: none      SPECIAL INSTRUCTIONS/FOLLOW-UP: Lab work Needed today, Xray Studies Needed :  CT of chest on 09-14-23 at 2:30pm and Return to Clinic in 1 month for follow-up with Dr. Mariel Sleet.   I acknowledge that I have been informed and understand all the instructions given to me and received a copy. I do not have any more questions at this time, but understand that I may call the Specialty Clinic at [facility] at [phone] during business hours should I have any further questions or need assistance in obtaining follow-up care.    __________________________________________  _____________  __________ Signature of Patient or Authorized Representative            Date                   Time    __________________________________________ Nurse's Signature

## 2012-08-21 LAB — IGG, IGA, IGM
IgG (Immunoglobin G), Serum: 1190 mg/dL (ref 690–1700)
IgM, Serum: 42 mg/dL — ABNORMAL LOW (ref 52–322)

## 2012-08-21 LAB — IRON AND TIBC: Iron: 13 ug/dL — ABNORMAL LOW (ref 42–135)

## 2012-08-22 LAB — PROTEIN ELECTROPHORESIS, SERUM
Alpha-2-Globulin: 12 % — ABNORMAL HIGH (ref 7.1–11.8)
Beta Globulin: 7.9 % — ABNORMAL HIGH (ref 4.7–7.2)
Gamma Globulin: 18.4 % (ref 11.1–18.8)
M-Spike, %: NOT DETECTED g/dL

## 2012-08-22 LAB — IMMUNOFIXATION ELECTROPHORESIS: IgG (Immunoglobin G), Serum: 1300 mg/dL (ref 690–1700)

## 2012-08-23 ENCOUNTER — Telehealth (INDEPENDENT_AMBULATORY_CARE_PROVIDER_SITE_OTHER): Payer: Self-pay | Admitting: *Deleted

## 2012-08-23 ENCOUNTER — Other Ambulatory Visit (INDEPENDENT_AMBULATORY_CARE_PROVIDER_SITE_OTHER): Payer: Self-pay | Admitting: *Deleted

## 2012-08-23 ENCOUNTER — Encounter (HOSPITAL_BASED_OUTPATIENT_CLINIC_OR_DEPARTMENT_OTHER): Payer: Medicare Other

## 2012-08-23 VITALS — BP 150/79 | HR 84 | Temp 97.9°F | Resp 16

## 2012-08-23 DIAGNOSIS — D649 Anemia, unspecified: Secondary | ICD-10-CM

## 2012-08-23 DIAGNOSIS — D509 Iron deficiency anemia, unspecified: Secondary | ICD-10-CM

## 2012-08-23 MED ORDER — SODIUM CHLORIDE 0.9 % IJ SOLN
10.0000 mL | INTRAMUSCULAR | Status: DC | PRN
  Administered 2012-08-23: 10 mL via INTRAVENOUS
  Filled 2012-08-23: qty 10

## 2012-08-23 MED ORDER — SODIUM CHLORIDE 0.9 % IV SOLN
INTRAVENOUS | Status: DC
  Administered 2012-08-23: 14:00:00 via INTRAVENOUS

## 2012-08-23 MED ORDER — SODIUM CHLORIDE 0.9 % IJ SOLN
INTRAMUSCULAR | Status: AC
  Filled 2012-08-23: qty 10

## 2012-08-23 MED ORDER — SODIUM CHLORIDE 0.9 % IV SOLN
1020.0000 mg | Freq: Once | INTRAVENOUS | Status: AC
  Administered 2012-08-23: 1020 mg via INTRAVENOUS
  Filled 2012-08-23: qty 34

## 2012-08-23 NOTE — Progress Notes (Signed)
Tolerated infusion well. 

## 2012-08-23 NOTE — Telephone Encounter (Signed)
PCP/Requesting MD: Mariel Sleet  Name & DOB: [name] [birth year]   Procedure: egd  Reason/Indication:  Iron def anemia  Has patient had this procedure before?    If so, when, by whom and where?    Is there a family history of colon cancer?    Who?  What age when diagnosed?    Is patient diabetic?   no      Does patient have prosthetic heart valve?  no  Do you have a pacemaker?  no  Has patient had joint replacement within last 12 months?  no  Is patient on Coumadin, Plavix and/or Aspirin? no  Medications: see EPIC  Allergies: see EPIC  Medication Adjustment: asa 2 days  Procedure date & time: 09/12/12 at 325

## 2012-08-26 ENCOUNTER — Ambulatory Visit (HOSPITAL_COMMUNITY)
Admission: RE | Admit: 2012-08-26 | Discharge: 2012-08-26 | Disposition: A | Discharge status: Home / Self Care | Payer: Medicare Other | Source: Ambulatory Visit | Attending: Oncology | Admitting: Oncology

## 2012-08-26 DIAGNOSIS — K769 Liver disease, unspecified: Secondary | ICD-10-CM | POA: Insufficient documentation

## 2012-08-26 DIAGNOSIS — I2789 Other specified pulmonary heart diseases: Secondary | ICD-10-CM | POA: Insufficient documentation

## 2012-08-26 DIAGNOSIS — R0602 Shortness of breath: Secondary | ICD-10-CM | POA: Insufficient documentation

## 2012-08-26 DIAGNOSIS — R911 Solitary pulmonary nodule: Secondary | ICD-10-CM | POA: Insufficient documentation

## 2012-08-26 DIAGNOSIS — D649 Anemia, unspecified: Secondary | ICD-10-CM

## 2012-08-26 NOTE — Telephone Encounter (Signed)
agree

## 2012-08-30 ENCOUNTER — Other Ambulatory Visit: Payer: Self-pay

## 2012-08-30 DIAGNOSIS — K219 Gastro-esophageal reflux disease without esophagitis: Secondary | ICD-10-CM

## 2012-08-30 MED ORDER — PANTOPRAZOLE SODIUM 40 MG PO TBEC: 40.0000 mg | DELAYED_RELEASE_TABLET | Freq: Every day | ORAL | Status: DC

## 2012-09-02 ENCOUNTER — Ambulatory Visit (INDEPENDENT_AMBULATORY_CARE_PROVIDER_SITE_OTHER): Payer: Medicare Other | Admitting: Internal Medicine

## 2012-09-02 ENCOUNTER — Telehealth (HOSPITAL_COMMUNITY): Payer: Self-pay

## 2012-09-02 NOTE — Telephone Encounter (Signed)
Call from patient wanting results of CT Scan done last week. Also, wants MD to know that she is doing much better since iron infusion, skin no longer itching and no longer has the "crawly" sensations under her skin and her legs.

## 2012-09-03 MED ORDER — BUPIVACAINE-EPINEPHRINE PF 0.5-1:200000 % IJ SOLN
INTRAMUSCULAR | Status: AC
  Filled 2012-09-03: qty 10

## 2012-09-03 MED ORDER — LIDOCAINE-EPINEPHRINE (PF) 1 %-1:200000 IJ SOLN
INTRAMUSCULAR | Status: AC
  Filled 2012-09-03: qty 10

## 2012-09-04 ENCOUNTER — Telehealth (HOSPITAL_COMMUNITY): Payer: Self-pay | Admitting: Oncology

## 2012-09-04 ENCOUNTER — Encounter (HOSPITAL_COMMUNITY): Payer: Self-pay | Admitting: Pharmacy Technician

## 2012-09-04 NOTE — Telephone Encounter (Signed)
I have spoken with Mrs. [name] and Dr. Ouida [name] we are going to obtain a pulmonary consultation for the changes seen on her CT scan. She remembers seen Dr. Maple [name] 10 or more years ago for something in her lungs. She cannot remember any details. So we will reconsult him and repeat his CT scan in 6 months for the small nodules. She was not a smoker herself and doesn't think she was exposed to a lot of secondhand smoke realistically.  From the standpoint of her iron deficiency she is already feeling much better. She is however going to had EGD in the very near future since her colonoscopy recently was negative

## 2012-09-05 ENCOUNTER — Other Ambulatory Visit (HOSPITAL_COMMUNITY): Payer: Self-pay | Admitting: Oncology

## 2012-09-05 DIAGNOSIS — R911 Solitary pulmonary nodule: Secondary | ICD-10-CM

## 2012-09-11 MED ORDER — SODIUM CHLORIDE 0.45 % IV SOLN
INTRAVENOUS | Status: DC
Start: 2012-09-11 — End: 2012-09-12
  Administered 2012-09-12: 10:00:00 via INTRAVENOUS

## 2012-09-12 ENCOUNTER — Encounter (HOSPITAL_COMMUNITY)
Admission: RE | Disposition: A | Discharge status: Home / Self Care | Payer: Self-pay | Source: Ambulatory Visit | Attending: Internal Medicine

## 2012-09-12 ENCOUNTER — Encounter (HOSPITAL_COMMUNITY): Payer: Self-pay | Admitting: *Deleted

## 2012-09-12 ENCOUNTER — Ambulatory Visit (HOSPITAL_COMMUNITY)
Admission: RE | Admit: 2012-09-12 | Discharge: 2012-09-12 | Disposition: A | Discharge status: Home / Self Care | Payer: Medicare Other | Source: Ambulatory Visit | Attending: Internal Medicine | Admitting: Internal Medicine

## 2012-09-12 DIAGNOSIS — I10 Essential (primary) hypertension: Secondary | ICD-10-CM | POA: Insufficient documentation

## 2012-09-12 DIAGNOSIS — D509 Iron deficiency anemia, unspecified: Secondary | ICD-10-CM

## 2012-09-12 DIAGNOSIS — Z9889 Other specified postprocedural states: Secondary | ICD-10-CM

## 2012-09-12 DIAGNOSIS — K31819 Angiodysplasia of stomach and duodenum without bleeding: Secondary | ICD-10-CM

## 2012-09-12 DIAGNOSIS — K319 Disease of stomach and duodenum, unspecified: Secondary | ICD-10-CM

## 2012-09-12 DIAGNOSIS — K449 Diaphragmatic hernia without obstruction or gangrene: Secondary | ICD-10-CM

## 2012-09-12 DIAGNOSIS — D131 Benign neoplasm of stomach: Secondary | ICD-10-CM

## 2012-09-12 HISTORY — DX: Angiodysplasia of stomach and duodenum without bleeding: K31.819

## 2012-09-12 HISTORY — PX: ESOPHAGOGASTRODUODENOSCOPY: SHX5428

## 2012-09-12 SURGERY — EGD (ESOPHAGOGASTRODUODENOSCOPY)
Anesthesia: Moderate Sedation

## 2012-09-12 MED ORDER — BUTAMBEN-TETRACAINE-BENZOCAINE 2-2-14 % EX AERO
INHALATION_SPRAY | CUTANEOUS | Status: DC | PRN
  Administered 2012-09-12: 2 via TOPICAL

## 2012-09-12 MED ORDER — MEPERIDINE HCL 25 MG/ML IJ SOLN
INTRAMUSCULAR | Status: DC | PRN
  Administered 2012-09-12 (×2): 25 mg via INTRAVENOUS

## 2012-09-12 MED ORDER — SUCRALFATE 1 GM/10ML PO SUSP: 1.0000 g | Freq: Three times a day (TID) | ORAL | Status: DC

## 2012-09-12 MED ORDER — STERILE WATER FOR IRRIGATION IR SOLN
Status: DC | PRN
  Administered 2012-09-12: 10:00:00

## 2012-09-12 MED ORDER — MEPERIDINE HCL 50 MG/ML IJ SOLN
INTRAMUSCULAR | Status: AC
  Filled 2012-09-12: qty 1

## 2012-09-12 MED ORDER — MIDAZOLAM HCL 5 MG/5ML IJ SOLN
INTRAMUSCULAR | Status: AC
  Filled 2012-09-12: qty 10

## 2012-09-12 MED ORDER — MIDAZOLAM HCL 5 MG/5ML IJ SOLN
INTRAMUSCULAR | Status: DC | PRN
  Administered 2012-09-12: 2 mg via INTRAVENOUS
  Administered 2012-09-12: 1 mg via INTRAVENOUS
  Administered 2012-09-12: 2 mg via INTRAVENOUS

## 2012-09-12 NOTE — H&P (Signed)
[name] is an 78 y.o. female.   Chief Complaint: Patient is here for EGD. HPI: Patient is 78 year old Caucasian female who was recently found to be iron deficiency anemia. Her stool has been guaiac negative. She feels so much better since she had iron infusion by Dr. Mariel [name]. She had colonoscopy on 07/24/2012. Now she is undergoing EGD looking for source of blood loss from upper GI tract. She denies heartburn dysphagia nausea vomiting abdominal pain or chronic diarrhea. He also denies melena or rectal bleeding. Past Medical History  Diagnosis Date  . Hypertension   . Hypothyroidism   . Palpitations   . Anemia     Past Surgical History  Procedure Date  . Tonsillectomy and adenoidectomy   . US echocardiography 03/12/2006    EF 55-60%  . Knee replacement rt 3 yrs ago   . Back surger x 2   . Abdominal hysterectomy   . Shoulder surgery   . Colonoscopy 07/24/2012    Procedure: COLONOSCOPY;  Surgeon: Mary Hippo, MD;  Location: [facility];  Service: Endoscopy;  Laterality: N/A;  200  . Heel spur surgery     Family History  Problem Relation Age of Onset  . Hypertension Mother   . Hypertension Father   . Heart attack Father    Social History:  reports that she has never smoked. She does not have any smokeless tobacco history on file. She reports that she does not drink alcohol or use illicit drugs.  Allergies:  Allergies  Allergen Reactions  . Avapro (Irbesartan) Other (See Comments)    Cough   . Clarithromycin Other (See Comments)    Unknown   . Losartan Potassium-Hctz Other (See Comments)    Unknown  . Ziac (Bisoprolol-Hydrochlorothiazide)     Thinks caused itching and cough 08/12/12  . Penicillins Rash  . Sulfa Drugs Cross Reactors Rash    Medications Prior to Admission  Medication Sig Dispense Refill  . aspirin EC 81 MG tablet Take 81 mg by mouth daily.      Marland Kitchen levothyroxine (SYNTHROID, LEVOTHROID) 125 MCG tablet Take 125 mcg by mouth daily.      . metoprolol  tartrate (LOPRESSOR) 25 MG tablet Take 25 mg by mouth daily.      . pantoprazole (PROTONIX) 40 MG tablet Take 1 tablet (40 mg total) by mouth daily.  30 tablet  11  . potassium chloride (K-DUR,KLOR-CON) 10 MEQ tablet Take 20 mEq by mouth daily.       . temazepam (RESTORIL) 15 MG capsule Take 1 capsule (15 mg total) by mouth at bedtime as needed for sleep.  30 capsule  1  . Multiple Vitamins-Minerals (CENTRUM SILVER PO) Take 1 tablet by mouth daily.         No results found for this or any previous visit (from the past 48 hour(s)). No results found.  ROS  Blood pressure 174/86, pulse 89, temperature 97.8 F (36.6 C), temperature source Oral, resp. rate 24, height 5\' 1"  (1.549 m), weight 159 lb (72.122 kg), SpO2 92.00%. Physical Exam  Constitutional: She appears well-developed and well-nourished.  HENT:  Mouth/Throat: Oropharynx is clear and moist.  Eyes: Conjunctivae normal are normal. No scleral icterus.  Neck: No thyromegaly present.  Cardiovascular: Normal rate and regular rhythm.   Murmur heard. Respiratory: Effort normal and breath sounds normal.  GI: Soft. She exhibits no distension and no mass. There is no tenderness.  Musculoskeletal: She exhibits no edema.  Lymphadenopathy:    She has  no cervical adenopathy.  Neurological: She is alert.  Skin: Skin is warm and dry.     Assessment/Plan Iron deficiency anemia. Negative recent colonoscopy. Diagnostic EGD.  [name] U 09/12/2012, 10:26 AM

## 2012-09-12 NOTE — Op Note (Signed)
EGD PROCEDURE REPORT  PATIENT:  [name]  MR#:  [id number] Birthdate:  July 27, 1933, 78 y.o., female Endoscopist:  Dr. Malissa Hippo, MD Referred By:  Dr. Glenford Peers, MD Procedure Date: 09/12/2012  Procedure:   EGD with APC of gastric antral vascular ectasia.  Indications:  Patient is 78 year old Caucasian female with iron deficiency anemia. She had negative colonoscopy last month.            Informed Consent:  The risks, benefits, alternatives & imponderables which include, but are not limited to, bleeding, infection, perforation, drug reaction and potential missed lesion have been reviewed.  The potential for biopsy, lesion removal, esophageal dilation, etc. have also been discussed.  Questions have been answered.  All parties agreeable.  Please see history & physical in medical record for more information.  Medications:  Demerol 50 mg IV Versed 5 mg IV Cetacaine spray topically for oropharyngeal anesthesia  Description of procedure:  The endoscope was introduced through the mouth and advanced to the second portion of the duodenum without difficulty or limitations. The mucosal surfaces were surveyed very carefully during advancement of the scope and upon withdrawal.  Findings:  Esophagus:  Mucosa of the esophagus was normal. GEJ:   37 cm Hiatus:  39 cm Stomach:  Stomach was empty and distended very well with insufflation. Folds in the proximal stomach were normal. There were few small hyperplastic-appearing polyps at gastric body. Swollen tortuous antral folds covered with telangiectasia along with 2 erosions noted. Biopsy was taken because of underlying prominence to the folds. Out 40-50% of telangiectasia of are ablated using argon plasma coagulator. Duodenum:  Patchy bulbar erythema. No ulcer or erosions noted. Post bulbar mucosa was normal.  Therapeutic/Diagnostic Maneuvers Performed:  See above  Complications:  None  Impression: Small sliding heart hernia. Few small  hyperplastic polyps at gastric body. edematous and tortuous antral folds covered with telangiectasia and 2 erosions. Biopsy taken from these folds. About 40-50% of telangiectasia( GAVE) ablated with argon plasma coagulator. Suspect she must have been losing blood intermittently from gastric antral vascular ectasia.    Recommendations:  Sucralfate 1 g by mouth a.c. and each bedtime for 2 weeks. I will contact patient with results of biopsy and plan to bring her back for repeat APC therapy in  4-6 weeks  Salia Cangemi U  09/12/2012  11:09 AM  CC: Dr. Dwana Melena, MD & Dr. Bonnetta Barry ref. provider found         Dr. Moshe Salisbury, MD

## 2012-09-17 ENCOUNTER — Encounter (INDEPENDENT_AMBULATORY_CARE_PROVIDER_SITE_OTHER): Payer: Self-pay | Admitting: *Deleted

## 2012-09-18 ENCOUNTER — Encounter: Payer: Self-pay | Admitting: Internal Medicine

## 2012-09-18 ENCOUNTER — Ambulatory Visit (INDEPENDENT_AMBULATORY_CARE_PROVIDER_SITE_OTHER): Payer: Medicare Other | Admitting: Internal Medicine

## 2012-09-18 VITALS — BP 160/90 | HR 78 | Temp 97.6°F | Ht 61.0 in | Wt 160.2 lb

## 2012-09-18 DIAGNOSIS — I2789 Other specified pulmonary heart diseases: Secondary | ICD-10-CM

## 2012-09-18 DIAGNOSIS — R911 Solitary pulmonary nodule: Secondary | ICD-10-CM

## 2012-09-18 DIAGNOSIS — I272 Pulmonary hypertension, unspecified: Secondary | ICD-10-CM | POA: Insufficient documentation

## 2012-09-18 NOTE — Patient Instructions (Addendum)
Please see patient coordinator before you leave today  to schedule echocardiogram  We will place your record in a recall file for one year notification ideally you need a  repeat CT scan to complete the evaluation   Late add : ono RA ordered

## 2012-09-18 NOTE — Progress Notes (Signed)
  Subjective:    Patient ID: [name], female    DOB: 09-23-1933  MRN: [id number]  HPI  80 yowf never smoker with new onset sob April 2013 referred 09/18/2012 by Dr Mariel Sleet for sob  09/18/2012 1st pulmonary ov/ Jameshia Hayashida cc indolent onset sob April 2013 gradually worse through summer and into fall to point just across the room but no problems at rest or lying down, much better since dx with fe def anemia and rx with IV iron.  No obvious daytime variabilty or assoc chronic cough or cp or chest tightness, subjective wheeze overt sinus or hb symptoms. No unusual exp hx or h/o childhood pna/ asthma or premature birth to her knowledge.   Sleeping ok without nocturnal  or early am exacerbation  of respiratory  c/o's or need for noct saba. Also denies any obvious fluctuation of symptoms with weather or environmental changes or other aggravating or alleviating factors except as outlined above    Review of Systems  Constitutional: Negative for fever, chills and unexpected weight change.  HENT: Negative for ear pain, nosebleeds, congestion, sore throat, rhinorrhea, sneezing, trouble swallowing, dental problem, voice change, postnasal drip and sinus pressure.   Eyes: Negative for visual disturbance.  Respiratory: Negative for cough, choking and shortness of breath.   Cardiovascular: Negative for chest pain and leg swelling.  Gastrointestinal: Negative for vomiting, abdominal pain and diarrhea.  Genitourinary: Negative for difficulty urinating.  Musculoskeletal: Negative for arthralgias.  Skin: Negative for rash.  Neurological: Negative for tremors, syncope and headaches.  Hematological: Does not bruise/bleed easily.       Objective:   Physical Exam  Wt Readings from Last 3 Encounters:  09/23/12 160 lb (72.576 kg)  09/18/12 160 lb 3.2 oz (72.666 kg)  09/12/12 159 lb (72.122 kg)    amb wf nad  HEENT: nl dentition, turbinates, and orophanx. Nl external ear canals without cough reflex   NECK :   without JVD/Nodes/TM/ nl carotid upstrokes bilaterally   LUNGS: no acc muscle use, clear to A and P bilaterally without cough on insp or exp maneuvers   CV:  RRR  no s3 or murmur or increase in P2, no edema   ABD:  soft and nontender with nl excursion in the supine position. No bruits or organomegaly, bowel sounds nl  MS:  warm without deformities, calf tenderness, cyanosis or clubbing  SKIN: warm and dry without lesions    NEURO:  alert, approp, no deficits   08/20/12 CT  1. Scattered pulmonary nodules with a spiculated 6 x 8 mm nodule in  the left lower lobe. If the patient is at high risk for  bronchogenic carcinoma, follow-up chest CT at 3-6 months is  recommended. If the patient is at low risk for bronchogenic  carcinoma, follow-up chest CT at 6-12 months is recommended.  2. Scattered pleural parenchymal scarring with mosaic attenuation.  The latter finding can be seen with small airways disease.  3. Pulmonary arterial hypertension      Assessment & Plan:

## 2012-09-23 ENCOUNTER — Encounter (HOSPITAL_COMMUNITY): Payer: Medicare Other | Attending: Oncology | Admitting: Oncology

## 2012-09-23 ENCOUNTER — Encounter (HOSPITAL_COMMUNITY): Payer: Self-pay | Admitting: Oncology

## 2012-09-23 VITALS — BP 177/84 | HR 71 | Temp 97.6°F | Resp 18 | Wt 160.0 lb

## 2012-09-23 DIAGNOSIS — D649 Anemia, unspecified: Secondary | ICD-10-CM | POA: Insufficient documentation

## 2012-09-23 DIAGNOSIS — D801 Nonfamilial hypogammaglobulinemia: Secondary | ICD-10-CM | POA: Diagnosis not present

## 2012-09-23 DIAGNOSIS — D509 Iron deficiency anemia, unspecified: Secondary | ICD-10-CM | POA: Diagnosis not present

## 2012-09-23 LAB — CBC
HCT: 37.1 % (ref 36.0–46.0)
Hemoglobin: 11.8 g/dL — ABNORMAL LOW (ref 12.0–15.0)
MCH: 27.8 pg (ref 26.0–34.0)
MCV: 87.5 fL (ref 78.0–100.0)
RBC: 4.24 MIL/uL (ref 3.87–5.11)
WBC: 6.5 10*3/uL (ref 4.0–10.5)

## 2012-09-23 NOTE — Patient Instructions (Signed)
[facility]  Discharge Instructions  RECOMMENDATIONS MADE BY THE CONSULTANT AND ANY TEST RESULTS WILL BE SENT TO YOUR REFERRING DOCTOR.   EXAM FINDINGS BY MD TODAY AND SIGNS AND SYMPTOMS TO REPORT TO CLINIC OR PRIMARY MD: Exam findings as discussed by Dr. Mariel Sleet.  SPECIAL INSTRUCTIONS/FOLLOW-UP: 1.  You had labs performed today and will need them again in 3 and 6 months. 2.  Please keep your appointment to see T. Kefalas, PA-C in 6 months.  Contact us sooner if needed.  I acknowledge that I have been informed and understand all the instructions given to me and received a copy. I do not have any more questions at this time, but understand that I may call the Specialty Clinic at [facility] at [phone] during business hours should I have any further questions or need assistance in obtaining follow-up care.    __________________________________________  _____________  __________ Signature of Patient or Authorized Representative            Date                   Time    __________________________________________ Nurse's Signature

## 2012-09-23 NOTE — Progress Notes (Signed)
Problem #1 severe iron deficiency with a serum I. of 13, TIBC of 467, and a percent saturation of 3%. Her ferritin was 6 on 08/20/2012. Her hemoglobin was 9 g. White count and platelets were fine. Since receiving the feraheme infusion she feels like a different person. The skin sensitivity has disappeared, the ice craving has disappeared, her strength is improved, her breathing is better.  she still has right rales at both lung bases. They do not go away with coughing. She did have mild hypogammaglobulinemia the IgM class but did not have a monoclonal spike in the minimally elevated IgA class. She was found by Dr. Karilyn Cota to have GAVE syndrome and will be rescoped again. She is also seen Dr. Sherene Sires we will see her again next year. We will see her in 6 months but will do blood work today, in 3 months, and 6 months.

## 2012-09-24 ENCOUNTER — Other Ambulatory Visit (HOSPITAL_COMMUNITY): Payer: Self-pay | Admitting: Oncology

## 2012-09-24 ENCOUNTER — Ambulatory Visit (HOSPITAL_COMMUNITY): Payer: Medicare Other | Attending: Internal Medicine | Admitting: Radiology

## 2012-09-24 DIAGNOSIS — R0602 Shortness of breath: Secondary | ICD-10-CM | POA: Diagnosis not present

## 2012-09-24 DIAGNOSIS — I272 Pulmonary hypertension, unspecified: Secondary | ICD-10-CM

## 2012-09-24 DIAGNOSIS — I10 Essential (primary) hypertension: Secondary | ICD-10-CM | POA: Insufficient documentation

## 2012-09-24 DIAGNOSIS — I2789 Other specified pulmonary heart diseases: Secondary | ICD-10-CM | POA: Insufficient documentation

## 2012-09-24 DIAGNOSIS — I517 Cardiomegaly: Secondary | ICD-10-CM | POA: Insufficient documentation

## 2012-09-24 DIAGNOSIS — R002 Palpitations: Secondary | ICD-10-CM | POA: Insufficient documentation

## 2012-09-24 DIAGNOSIS — I359 Nonrheumatic aortic valve disorder, unspecified: Secondary | ICD-10-CM | POA: Insufficient documentation

## 2012-09-24 LAB — IRON AND TIBC
Saturation Ratios: 10 % — ABNORMAL LOW (ref 20–55)
UIBC: 333 ug/dL (ref 125–400)

## 2012-09-24 NOTE — Progress Notes (Signed)
Echocardiogram performed.  

## 2012-09-25 ENCOUNTER — Encounter: Payer: Self-pay | Admitting: Internal Medicine

## 2012-09-26 ENCOUNTER — Encounter: Payer: Self-pay | Admitting: Internal Medicine

## 2012-09-26 DIAGNOSIS — R911 Solitary pulmonary nodule: Secondary | ICD-10-CM | POA: Insufficient documentation

## 2012-09-26 NOTE — Assessment & Plan Note (Signed)
-   Echo 09/24/12 >Left ventricle: The cavity size was normal. Wall thickness was normal. Systolic function was normal. The estimated ejection fraction was in the range of 55% to 60%. Wall motion was normal; there were no regional wall motion abnormalities. - Aortic valve: Mild regurgitation. - Right atrium: The atrium was mildly dilated. - Pulmonary arteries: Systolic pressure was moderately increased. PA peak pressure: 54mm Hg    She only has mod PAH but may benefit from 02 rx Will check ono  RA

## 2012-09-27 ENCOUNTER — Telehealth: Payer: Self-pay | Admitting: Internal Medicine

## 2012-09-27 DIAGNOSIS — I272 Pulmonary hypertension, unspecified: Secondary | ICD-10-CM

## 2012-09-27 NOTE — Telephone Encounter (Signed)
Notes Recorded by Nyoka Cowden, MD on 09/26/2012 at 2:03 PM Call patient : Study is remarkable for mod pulmonary hbp so needs ono RA then f/u ov a week or two later to discuss setting up longterm f/u but nothing else to do for now -----  I spoke with patient about results and she verbalized understanding and had no questions. Pt agree'd to have ONO on RA done. I have sent order to [facility] pt aware will call back once she knows when the ONO will be done. Will forward to [facility] to ensure they did receive order.

## 2012-09-30 ENCOUNTER — Encounter (HOSPITAL_BASED_OUTPATIENT_CLINIC_OR_DEPARTMENT_OTHER): Payer: Medicare Other

## 2012-09-30 VITALS — BP 137/75 | HR 74 | Temp 97.4°F | Resp 16

## 2012-09-30 DIAGNOSIS — D509 Iron deficiency anemia, unspecified: Secondary | ICD-10-CM | POA: Diagnosis not present

## 2012-09-30 DIAGNOSIS — D649 Anemia, unspecified: Secondary | ICD-10-CM

## 2012-09-30 MED ORDER — SODIUM CHLORIDE 0.9 % IV SOLN
1020.0000 mg | Freq: Once | INTRAVENOUS | Status: AC
  Administered 2012-09-30: 1020 mg via INTRAVENOUS
  Filled 2012-09-30: qty 34

## 2012-09-30 MED ORDER — SODIUM CHLORIDE 0.9 % IJ SOLN
10.0000 mL | INTRAMUSCULAR | Status: DC | PRN
  Filled 2012-09-30: qty 10

## 2012-09-30 MED ORDER — SODIUM CHLORIDE 0.9 % IJ SOLN
INTRAMUSCULAR | Status: AC
  Filled 2012-09-30: qty 10

## 2012-09-30 MED ORDER — SODIUM CHLORIDE 0.9 % IV SOLN
Freq: Once | INTRAVENOUS | Status: AC
  Administered 2012-09-30: 14:00:00 via INTRAVENOUS

## 2012-10-01 ENCOUNTER — Telehealth: Payer: Self-pay | Admitting: Cardiology

## 2012-10-01 DIAGNOSIS — I270 Primary pulmonary hypertension: Secondary | ICD-10-CM | POA: Diagnosis not present

## 2012-10-01 NOTE — Telephone Encounter (Signed)
Advised patient.  Keep appointment scheduled 12/3

## 2012-10-01 NOTE — Telephone Encounter (Signed)
Has been having palpitations worse when she lays down.  When she took metoprolol this am it seemed to "calm" down.  Heart rated running in the 70's but has been down as low as 68.  Patient states she is off Lanoxin and didn't know if she needed to go back on that (does not think that was causing her problems). Will forward  Dr. Patty Sermons for review.

## 2012-10-01 NOTE — Telephone Encounter (Signed)
Pt having palpitations for about two days, denies any other symptoms,  pls call

## 2012-10-01 NOTE — Telephone Encounter (Signed)
We will restart her digoxin.  Previously she was on 0.125 mg daily.  Her pulse would get a little too slow at times.  This time we will have her take digoxin 0.125 mg just 5 days out of 7 and she should skip Wednesdays and Sundays starting this Sunday

## 2012-10-08 ENCOUNTER — Encounter: Payer: Self-pay | Admitting: Internal Medicine

## 2012-10-14 ENCOUNTER — Encounter (INDEPENDENT_AMBULATORY_CARE_PROVIDER_SITE_OTHER): Payer: Self-pay | Admitting: *Deleted

## 2012-10-15 ENCOUNTER — Other Ambulatory Visit (INDEPENDENT_AMBULATORY_CARE_PROVIDER_SITE_OTHER): Payer: Medicare Other

## 2012-10-15 ENCOUNTER — Encounter: Payer: Self-pay | Admitting: Cardiology

## 2012-10-15 ENCOUNTER — Ambulatory Visit (INDEPENDENT_AMBULATORY_CARE_PROVIDER_SITE_OTHER): Payer: Medicare Other | Admitting: Cardiology

## 2012-10-15 VITALS — BP 159/85 | HR 80 | Ht 61.0 in | Wt 157.8 lb

## 2012-10-15 DIAGNOSIS — I119 Hypertensive heart disease without heart failure: Secondary | ICD-10-CM

## 2012-10-15 DIAGNOSIS — R002 Palpitations: Secondary | ICD-10-CM | POA: Diagnosis not present

## 2012-10-15 DIAGNOSIS — D649 Anemia, unspecified: Secondary | ICD-10-CM

## 2012-10-15 DIAGNOSIS — D509 Iron deficiency anemia, unspecified: Secondary | ICD-10-CM

## 2012-10-15 DIAGNOSIS — E039 Hypothyroidism, unspecified: Secondary | ICD-10-CM

## 2012-10-15 HISTORY — DX: Iron deficiency anemia, unspecified: D50.9

## 2012-10-15 LAB — CBC WITH DIFFERENTIAL/PLATELET
Basophils Relative: 1 % (ref 0.0–3.0)
Eosinophils Absolute: 0.1 10*3/uL (ref 0.0–0.7)
Eosinophils Relative: 2.4 % (ref 0.0–5.0)
Hemoglobin: 12.1 g/dL (ref 12.0–15.0)
Lymphocytes Relative: 21.3 % (ref 12.0–46.0)
MCHC: 31.2 g/dL (ref 30.0–36.0)
MCV: 93.1 fl (ref 78.0–100.0)
Monocytes Absolute: 0.9 10*3/uL (ref 0.1–1.0)
Neutro Abs: 3.6 10*3/uL (ref 1.4–7.7)
Neutrophils Relative %: 60.8 % (ref 43.0–77.0)
RBC: 4.15 Mil/uL (ref 3.87–5.11)
WBC: 5.9 10*3/uL (ref 4.5–10.5)

## 2012-10-15 LAB — HEPATIC FUNCTION PANEL: Albumin: 3.5 g/dL (ref 3.5–5.2)

## 2012-10-15 LAB — LIPID PANEL
HDL: 51.9 mg/dL (ref >39.00)
LDL Cholesterol: 92 mg/dL (ref 0–99)
Total CHOL/HDL Ratio: 3
Triglycerides: 117 mg/dL (ref 0.0–149.0)

## 2012-10-15 LAB — BASIC METABOLIC PANEL
CO2: 28 mEq/L (ref 19–32)
Chloride: 103 mEq/L (ref 96–112)
Creatinine, Ser: 1 mg/dL (ref 0.4–1.2)
Sodium: 137 mEq/L (ref 135–145)

## 2012-10-15 LAB — T4, FREE: Free T4: 1.22 ng/dL (ref 0.60–1.60)

## 2012-10-15 MED ORDER — AMLODIPINE BESYLATE 5 MG PO TABS: 5.0000 mg | ORAL_TABLET | Freq: Every day | ORAL | Status: DC

## 2012-10-15 NOTE — Assessment & Plan Note (Signed)
The patient has iron deficiency anemia secondary to GAVE it shows gastric antral vascular ectasia.  She is followed by her gastroenterologist in [place].

## 2012-10-15 NOTE — Progress Notes (Signed)
Quick Note:  Please report to patient. The recent labs are stable. Continue same medication and careful diet. Thyroid level is normal. The hemoglobin has improved to 12.1. Potassium level is normal at 4.2. Kidney function is normal. Lipid levels and liver tests are normal. ______

## 2012-10-15 NOTE — Assessment & Plan Note (Signed)
Her blood pressure is higher today and we are going to add amlodipine 5 mg one daily to her regimen

## 2012-10-15 NOTE — Patient Instructions (Addendum)
ADD AMLODIPINE 5 MG DAILY  Your physician wants you to follow-up in: 4 months with fasting labs (lp/bmet/hfp) You will receive a reminder letter in the mail two months in advance. If you don't receive a letter, please call our office to schedule the follow-up appointment.   Will obtain labs today and call you with the results (CBC/BMET/HFP/FREE T4/LP)

## 2012-10-15 NOTE — Assessment & Plan Note (Signed)
The patient has not been experiencing any recent increase in chronic palpitations

## 2012-10-15 NOTE — Progress Notes (Signed)
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 She has a past history of angina hypertension and a history of frequent premature atrial beats. She had a recent nuclear stress test earlier this year on 12/21/11 because of exertional dyspnea. She was found to have no ischemia and her ejection fraction was 83% by Myoview.  She subsequently was found to have severe iron deficiency.  She was evaluated by Dr. Laurie Panda who gave her IV iron infusion and she felt better almost immediately.  She had been experiencing itching of her skin which resolved and she also had been craving ice which resolved after the iron infusion.  The patient had an echocardiogram on 09/24/12 which showed normal left ventricular systolic function with an ejection fraction of 55-60%.  She had moderate pulmonary hypertension with a pulmonary artery pressure of 54 and she has had a subsequent sleep study and is being followed by pulmonary.   Current Outpatient Prescriptions  Medication Sig Dispense Refill  . aspirin EC 81 MG tablet Take 81 mg by mouth daily.      . digoxin (LANOXIN) 0.125 MG tablet Take 0.125 mg by mouth as directed. Take 1 tablet daily except none on Wednesday and Sunday      . levothyroxine (SYNTHROID, LEVOTHROID) 125 MCG tablet Take 125 mcg by mouth daily.      . metoprolol tartrate (LOPRESSOR) 25 MG tablet Take 25 mg by mouth daily.      . Multiple Vitamins-Minerals (CENTRUM SILVER PO) Take 1 tablet by mouth daily.       . pantoprazole (PROTONIX) 40 MG tablet Take 1 tablet (40 mg total) by mouth daily.  30 tablet  11  . potassium chloride (K-DUR,KLOR-CON) 10 MEQ tablet Take 20 mEq by mouth daily.       Marland Kitchen amLODipine (NORVASC) 5 MG tablet Take 1 tablet (5 mg total) by mouth daily.  90 tablet  3  .  sucralfate (CARAFATE) 1 GM/10ML suspension Take 10 mLs (1 g total) by mouth 4 (four) times daily -  with meals and at bedtime.  420 mL  0  . temazepam (RESTORIL) 15 MG capsule Take 1 capsule (15 mg total) by mouth at bedtime as needed for sleep.  30 capsule  1    Allergies  Allergen Reactions  . Avapro (Irbesartan) Other (See Comments)    Cough   . Clarithromycin Other (See Comments)    Unknown   . Losartan Potassium-Hctz Other (See Comments)    Unknown  . Ziac (Bisoprolol-Hydrochlorothiazide)     Thinks caused itching and cough 08/12/12  . Penicillins Rash  . Sulfa Drugs Cross Reactors Rash    Patient Active Problem List  Diagnosis  . Benign hypertensive heart disease without heart failure  . Palpitations  . Hypothyroidism  . GERD (gastroesophageal reflux disease)  . Pruritus  . Anemia  . Pulmonary hypertension  . Pulmonary nodule  . Iron deficiency anemia    History  Smoking status  . Never Smoker   Smokeless tobacco  . Never Used    History  Alcohol Use No    Family History  Problem Relation Age of Onset  . Hypertension Mother   . Hypertension Father   . Heart attack Father     Review of  Systems: Constitutional: no fever chills diaphoresis or fatigue or change in weight.  Head and neck: no hearing loss, no epistaxis, no photophobia or visual disturbance. Respiratory: No cough, shortness of breath or wheezing. Cardiovascular: No chest pain peripheral edema, palpitations. Gastrointestinal: No abdominal distention, no abdominal pain, no change in bowel habits hematochezia or melena. Genitourinary: No dysuria, no frequency, no urgency, no nocturia. Musculoskeletal:No arthralgias, no back pain, no gait disturbance or myalgias. Neurological: No dizziness, no headaches, no numbness, no seizures, no syncope, no weakness, no tremors. Hematologic: No lymphadenopathy, no easy bruising. Psychiatric: No confusion, no hallucinations, no sleep  disturbance.    Physical Exam: Filed Vitals:   10/15/12 1040  BP: 159/85  Pulse: 80   the general appearance reveals a well-developed well-nourished woman in no distress.The head and neck exam reveals pupils equal and reactive.  Extraocular movements are full.  There is no scleral icterus.  The mouth and pharynx are normal.  The neck is supple.  The carotids reveal no bruits.  The jugular venous pressure is normal.  The  thyroid is not enlarged.  There is no lymphadenopathy.  The chest is clear to percussion and auscultation.  There are no rales or rhonchi.  Expansion of the chest is symmetrical.  The precordium is quiet.  The first heart sound is normal.  The second heart sound is physiologically split.  There is no murmur gallop rub or click.  There is no abnormal lift or heave.  The abdomen is soft and nontender.  The bowel sounds are normal.  The liver and spleen are not enlarged.  There are no abdominal masses.  There are no abdominal bruits.  Extremities reveal good pedal pulses.  There is no phlebitis or edema.  There is no cyanosis or clubbing.  Strength is normal and symmetrical in all extremities.  There is no lateralizing weakness.  There are no sensory deficits.  The skin is warm and dry.  There is no rash.     Assessment / Plan: Continue same medication except add amlodipine 5 mg one daily.  Blood work today including thyroid function studies.  Recheck in 4 months for office visit EKG lipid panel hepatic function panel and basal metabolic panel.

## 2012-10-16 ENCOUNTER — Telehealth (INDEPENDENT_AMBULATORY_CARE_PROVIDER_SITE_OTHER): Payer: Self-pay | Admitting: *Deleted

## 2012-10-16 ENCOUNTER — Encounter (INDEPENDENT_AMBULATORY_CARE_PROVIDER_SITE_OTHER): Payer: Self-pay | Admitting: *Deleted

## 2012-10-16 ENCOUNTER — Other Ambulatory Visit (INDEPENDENT_AMBULATORY_CARE_PROVIDER_SITE_OTHER): Payer: Self-pay | Admitting: *Deleted

## 2012-10-16 DIAGNOSIS — K31819 Angiodysplasia of stomach and duodenum without bleeding: Secondary | ICD-10-CM

## 2012-10-16 NOTE — Telephone Encounter (Signed)
agree

## 2012-10-16 NOTE — Telephone Encounter (Signed)
  Procedure: egd w/ apc  Reason/Indication:  GAVE  Has patient had this procedure before?  yes  If so, when, by whom and where?  10/13  Is there a family history of colon cancer?    Who?  What age when diagnosed?    Is patient diabetic?   mo      Does patient have prosthetic heart valve?  no  Do you have a pacemaker?  no  Has patient had joint replacement within last 12 months?  no  Is patient on Coumadin, Plavix and/or Aspirin? yes  Medications: see EPIC  Allergies: see EPIC  Medication Adjustment: asa 2 days  Procedure date & time: 11/01/12 at 120

## 2012-10-17 ENCOUNTER — Encounter: Payer: Self-pay | Admitting: Internal Medicine

## 2012-10-17 ENCOUNTER — Ambulatory Visit (INDEPENDENT_AMBULATORY_CARE_PROVIDER_SITE_OTHER): Payer: Medicare Other | Admitting: Internal Medicine

## 2012-10-17 ENCOUNTER — Telehealth: Payer: Self-pay | Admitting: *Deleted

## 2012-10-17 VITALS — BP 126/76 | HR 68 | Temp 98.2°F | Ht 61.0 in | Wt 159.4 lb

## 2012-10-17 DIAGNOSIS — I2789 Other specified pulmonary heart diseases: Secondary | ICD-10-CM | POA: Diagnosis not present

## 2012-10-17 DIAGNOSIS — I272 Pulmonary hypertension, unspecified: Secondary | ICD-10-CM

## 2012-10-17 DIAGNOSIS — J9611 Chronic respiratory failure with hypoxia: Secondary | ICD-10-CM | POA: Insufficient documentation

## 2012-10-17 DIAGNOSIS — J9610 Chronic respiratory failure, unspecified whether with hypoxia or hypercapnia: Secondary | ICD-10-CM

## 2012-10-17 DIAGNOSIS — R911 Solitary pulmonary nodule: Secondary | ICD-10-CM

## 2012-10-17 NOTE — Assessment & Plan Note (Addendum)
CT chest 08/20/12 - Scattered pulmonary nodules with a spiculated 6 x 8 mm nodule in  the left lower lobe  Scattered pleural parenchymal scarring with mosaic attenuation.  The latter finding can be seen with small airways disease Although there are clearly abnormalities on CT scan, they should probably be considered "microscopic" since not obvious on plain cxr .     In the setting of obvious "macroscopic" health issues,  I am very reluctatnt to embark on an invasive w/u at this point but will arrange consevative  follow up and in the meantime see what we can do to address the patient's subjective concerns.    Placed in tickle file for f/u at one year as is at very low risk

## 2012-10-17 NOTE — Assessment & Plan Note (Signed)
-   Echo 09/24/12 >Left ventricle: The cavity size was normal. Wall thickness was normal. Systolic function was normal. The estimated ejection fraction was in the range of 55% to 60%. Wall motion was normal; there were no regional wall motion abnormalities. - Aortic valve: Mild regurgitation. - Right atrium: The atrium was mildly dilated. - Pulmonary arteries: Systolic pressure was moderately increased. PA peak pressure: 54mm Hg  - ONO  RA rec 10/01/2012  >  sats <= 88%  For 8h 40 min > rec 02 2lpm and repeat study ordered 10/17/2012   She has mod pah most likely related to noct desat so first step is to normalize 02 on 2lpm and if not effective at keeping sats > 88% needs formal PSS If does eliminate noct desats on 2lpm would ask her to wear at 2lpm and repeat the Echo around 03/13/12 (p 6 mo rx)

## 2012-10-17 NOTE — Progress Notes (Signed)
  Subjective:    Patient ID: [name], female    DOB: [birth year]  MRN: [id number]  HPI  89 yowf never smoker with new onset sob April 2013 referred 09/18/2012 by Dr Mariel Sleet for sob  09/18/2012 1st pulmonary ov/ Wert cc indolent onset sob April 2013 gradually worse through summer and into fall to point just across the room but no problems at rest or lying down, much better since dx with fe def anemia and rx with IV iron. rec  schedule echocardiogram> mod PAH We will place your record in a recall file for one year notification ideally you need a  repeat CT scan to complete the evaluation  Late add : ono RA ordered > pos desat    10/17/2012 f/u ov/Wert cc back to baseline in terms of activity tolerance with no limiting sob- attributes this to iron rx. Denies am ha or hypersomnolence   No obvious daytime variabilty or assoc chronic cough or cp or chest tightness, subjective wheeze overt sinus or hb symptoms. No unusual exp hx or h/o childhood pna/ asthma or premature birth to her knowledge.   Sleeping ok without nocturnal  or early am exacerbation  of respiratory  c/o's or need for noct saba. Also denies any obvious fluctuation of symptoms with weather or environmental changes or other aggravating or alleviating factors except as outlined above   ROS  The following are not active complaints unless bolded sore throat, dysphagia, dental problems, itching, sneezing,  nasal congestion or excess/ purulent secretions, ear ache,   fever, chills, sweats, unintended wt loss, pleuritic or exertional cp, hemoptysis,  orthopnea pnd or leg swelling, presyncope, palpitations, heartburn, abdominal pain, anorexia, nausea, vomiting, diarrhea  or change in bowel or urinary habits, change in stools or urine, dysuria,hematuria,  rash, arthralgias, visual complaints, headache, numbness weakness or ataxia or problems with walking or coordination,  change in mood/affect or memory.         Objective:   Physical  Exam  Wt 159 10/17/2012  Wt Readings from Last 3 Encounters:  09/23/12 160 lb (72.576 kg)  09/18/12 160 lb 3.2 oz (72.666 kg)  09/12/12 159 lb (72.122 kg)    amb wf nad  HEENT: nl dentition, turbinates, and orophanx. Nl external ear canals without cough reflex   NECK :  without JVD/Nodes/TM/ nl carotid upstrokes bilaterally   LUNGS: no acc muscle use, clear to A and P bilaterally without cough on insp or exp maneuvers   CV:  RRR  no s3 or murmur or increase in P2, no edema   ABD:  soft and nontender with nl excursion in the supine position. No bruits or organomegaly, bowel sounds nl  MS:  warm without deformities, calf tenderness, cyanosis or clubbing         08/20/12 CT  1. Scattered pulmonary nodules with a spiculated 6 x 8 mm nodule in  the left lower lobe. If the patient is at high risk for  bronchogenic carcinoma, follow-up chest CT at 3-6 months is  recommended. If the patient is at low risk for bronchogenic  carcinoma, follow-up chest CT at 6-12 months is recommended.  2. Scattered pleural parenchymal scarring with mosaic attenuation.  The latter finding can be seen with small airways disease.  3. Pulmonary arterial hypertension      Assessment & Plan:

## 2012-10-17 NOTE — Assessment & Plan Note (Signed)
Based on ONO  RA rec 10/01/2012  >  sats <= 88%  For 8h 40 min > rec 02 2lpm and repeat study ordered 10/17/2012

## 2012-10-17 NOTE — Telephone Encounter (Signed)
Mailed copy of labs and left message to call if any questions  

## 2012-10-17 NOTE — Telephone Encounter (Signed)
Message copied by Burnell Blanks on Thu Oct 17, 2012  3:24 PM ------      Message from: Cassell Clement      Created: Tue Oct 15, 2012  4:34 PM       Please report to patient.  The recent labs are stable. Continue same medication and careful diet.  Thyroid level is normal.  The hemoglobin has improved to 12.1.  Potassium level is normal at 4.2.  Kidney function is normal.  Lipid levels and liver tests are normal.

## 2012-10-17 NOTE — Patient Instructions (Addendum)
Please see patient coordinator before you leave today  to schedule overnight oxygen on 2lpm  - you should hear back from [place] about a week after the test for longterm recommendations

## 2012-10-21 ENCOUNTER — Other Ambulatory Visit (HOSPITAL_COMMUNITY): Payer: Medicare Other

## 2012-10-22 ENCOUNTER — Other Ambulatory Visit: Payer: Medicare Other

## 2012-10-22 ENCOUNTER — Telehealth: Payer: Self-pay | Admitting: Internal Medicine

## 2012-10-22 ENCOUNTER — Ambulatory Visit: Payer: Medicare Other | Admitting: Cardiology

## 2012-10-22 DIAGNOSIS — R0902 Hypoxemia: Secondary | ICD-10-CM

## 2012-10-22 NOTE — Telephone Encounter (Signed)
Pt returned call re: results. [name]

## 2012-10-22 NOTE — Telephone Encounter (Signed)
Per MW- ONO on RA shows that pt does need o2- need to order nocturnal o2 at 2lpm and then have her repeat ONO on 2lpm.  ATC the pt with results. Had to [facility].

## 2012-10-23 ENCOUNTER — Telehealth: Payer: Self-pay | Admitting: Internal Medicine

## 2012-10-23 ENCOUNTER — Encounter (HOSPITAL_COMMUNITY): Payer: Self-pay | Admitting: Pharmacy Technician

## 2012-10-23 NOTE — Telephone Encounter (Signed)
Spoke with pt and notified of results per Dr. Sherene Sires. Pt verbalized understanding and denied any questions. Order sent to [facility] for ONO on 2 lpm

## 2012-10-23 NOTE — Telephone Encounter (Signed)
Spoke with patient's husband-he will have patient call us back-we need to have patient contact [facility] back in regards to doing an ONO.

## 2012-10-23 NOTE — Telephone Encounter (Signed)
Pt called back stating that [facility] can call her tomorrow(Thursday 10-24-12) between 8am to 10am to schedule this. Pt has been out Christmas shopping-didn't have time to wait for them to call her. Will hold for Triage to call in am to inform [facility] of this as they are closed at this time.

## 2012-10-24 ENCOUNTER — Telehealth: Payer: Self-pay | Admitting: Internal Medicine

## 2012-10-24 ENCOUNTER — Encounter: Payer: Self-pay | Admitting: Cardiology

## 2012-10-24 NOTE — Telephone Encounter (Signed)
lmomtcb x1 for lecretia to make her aware

## 2012-10-24 NOTE — Telephone Encounter (Signed)
I called and made [facility] aware of this. Nothing further was needed

## 2012-10-24 NOTE — Telephone Encounter (Signed)
Faxed demographics to [facility]

## 2012-11-01 ENCOUNTER — Ambulatory Visit (HOSPITAL_COMMUNITY)
Admission: RE | Admit: 2012-11-01 | Discharge: 2012-11-01 | Disposition: A | Discharge status: Home / Self Care | Payer: Medicare Other | Source: Ambulatory Visit | Attending: Internal Medicine | Admitting: Internal Medicine

## 2012-11-01 ENCOUNTER — Encounter (HOSPITAL_COMMUNITY): Payer: Self-pay | Admitting: *Deleted

## 2012-11-01 ENCOUNTER — Encounter (HOSPITAL_COMMUNITY)
Admission: RE | Disposition: A | Discharge status: Home / Self Care | Payer: Self-pay | Source: Ambulatory Visit | Attending: Internal Medicine

## 2012-11-01 DIAGNOSIS — K31819 Angiodysplasia of stomach and duodenum without bleeding: Secondary | ICD-10-CM

## 2012-11-01 DIAGNOSIS — I10 Essential (primary) hypertension: Secondary | ICD-10-CM | POA: Insufficient documentation

## 2012-11-01 DIAGNOSIS — K31811 Angiodysplasia of stomach and duodenum with bleeding: Secondary | ICD-10-CM | POA: Insufficient documentation

## 2012-11-01 DIAGNOSIS — D509 Iron deficiency anemia, unspecified: Secondary | ICD-10-CM | POA: Insufficient documentation

## 2012-11-01 DIAGNOSIS — Z862 Personal history of diseases of the blood and blood-forming organs and certain disorders involving the immune mechanism: Secondary | ICD-10-CM

## 2012-11-01 HISTORY — PX: HOT HEMOSTASIS: SHX5433

## 2012-11-01 HISTORY — PX: ESOPHAGOGASTRODUODENOSCOPY: SHX5428

## 2012-11-01 SURGERY — EGD (ESOPHAGOGASTRODUODENOSCOPY)
Anesthesia: Moderate Sedation

## 2012-11-01 MED ORDER — MIDAZOLAM HCL 5 MG/5ML IJ SOLN
INTRAMUSCULAR | Status: DC | PRN
  Administered 2012-11-01 (×2): 2 mg via INTRAVENOUS

## 2012-11-01 MED ORDER — BUTAMBEN-TETRACAINE-BENZOCAINE 2-2-14 % EX AERO
INHALATION_SPRAY | CUTANEOUS | Status: DC | PRN
  Administered 2012-11-01: 2 via TOPICAL

## 2012-11-01 MED ORDER — STERILE WATER FOR IRRIGATION IR SOLN
Status: DC | PRN
  Administered 2012-11-01: 12:00:00

## 2012-11-01 MED ORDER — MEPERIDINE HCL 50 MG/ML IJ SOLN
INTRAMUSCULAR | Status: AC
  Filled 2012-11-01: qty 1

## 2012-11-01 MED ORDER — SODIUM CHLORIDE 0.45 % IV SOLN
INTRAVENOUS | Status: DC
  Administered 2012-11-01: 11:00:00 via INTRAVENOUS

## 2012-11-01 MED ORDER — SUCRALFATE 1 G PO TABS: 1.0000 g | ORAL_TABLET | Freq: Four times a day (QID) | ORAL | Status: DC

## 2012-11-01 MED ORDER — MEPERIDINE HCL 25 MG/ML IJ SOLN
INTRAMUSCULAR | Status: DC | PRN
  Administered 2012-11-01 (×2): 25 mg via INTRAVENOUS

## 2012-11-01 MED ORDER — MIDAZOLAM HCL 5 MG/5ML IJ SOLN
INTRAMUSCULAR | Status: AC
  Filled 2012-11-01: qty 10

## 2012-11-01 NOTE — H&P (Signed)
[name] is an 78 y.o. female.   Chief Complaint: Patient is here for EGD and APC of GAVE. HPI: Patient is 78 year old Caucasian female who presented with iron deficiency anemia and was found to gastric antral vascular ectasia and underwent APC therapy on 09/12/2012. Her hemoglobin has not dropped since then. She denies abdominal pain nausea vomiting melena or rectal bleeding.  Past Medical History  Diagnosis Date  . Hypertension   . Hypothyroidism   . Palpitations   . Anemia   . GAVE (gastric antral vascular ectasia) 09/12/12    Past Surgical History  Procedure Date  . Tonsillectomy and adenoidectomy   . US echocardiography 03/12/2006    EF 55-60%  . Knee replacement rt 3 yrs ago   . Back surger x 2   . Abdominal hysterectomy   . Shoulder surgery   . Colonoscopy 07/24/2012    Procedure: COLONOSCOPY;  Surgeon: Malissa Hippo, MD;  Location: [facility];  Service: Endoscopy;  Laterality: N/A;  200  . Heel spur surgery   . Esophagogastroduodenoscopy 09/12/2012    Procedure: ESOPHAGOGASTRODUODENOSCOPY (EGD);  Surgeon: Malissa Hippo, MD;  Location: [facility];  Service: Endoscopy;  Laterality: N/A;  325-changed to 200 per Ann-pt moved up to 1030 Ann to notify pt  . Total abdominal hysterectomy     Family History  Problem Relation Age of Onset  . Hypertension Mother   . Hypertension Father   . Heart attack Father    Social History:  reports that she has never smoked. She has never used smokeless tobacco. She reports that she does not drink alcohol or use illicit drugs.  Allergies:  Allergies  Allergen Reactions  . Avapro (Irbesartan) Other (See Comments)    Cough   . Clarithromycin Other (See Comments)    Unknown   . Losartan Potassium-Hctz Other (See Comments)    Unknown  . Ziac (Bisoprolol-Hydrochlorothiazide)     Thinks caused itching and cough 08/12/12  . Penicillins Rash  . Sulfa Drugs Cross Reactors Rash    Medications Prior to Admission  Medication Sig  Dispense Refill  . amLODipine (NORVASC) 5 MG tablet Take 1 tablet (5 mg total) by mouth daily.  90 tablet  3  . aspirin EC 81 MG tablet Take 81 mg by mouth daily.      . digoxin (LANOXIN) 0.125 MG tablet Take 0.125 mg by mouth as directed. Take 1 tablet daily except none on Wednesday and Sunday      . levothyroxine (SYNTHROID, LEVOTHROID) 125 MCG tablet Take 125 mcg by mouth daily.      . metoprolol tartrate (LOPRESSOR) 25 MG tablet Take 25 mg by mouth daily.      . pantoprazole (PROTONIX) 40 MG tablet Take 1 tablet (40 mg total) by mouth daily.  30 tablet  11  . potassium chloride (K-DUR,KLOR-CON) 10 MEQ tablet Take 20 mEq by mouth daily.       . Multiple Vitamins-Minerals (CENTRUM SILVER PO) Take 1 tablet by mouth daily.         No results found for this or any previous visit (from the past 48 hour(s)). No results found.  ROS  Blood pressure 136/64, pulse 71, temperature 97.7 F (36.5 C), temperature source Oral, resp. rate 24, height 5\' 1"  (1.549 m), weight 159 lb (72.122 kg), SpO2 98.00%. Physical Exam  Constitutional: She appears well-developed and well-nourished.  HENT:  Mouth/Throat: Oropharynx is clear and moist.  Eyes: Conjunctivae normal are normal. No scleral icterus.  Neck: No thyromegaly present.  Cardiovascular: Normal rate, regular rhythm and normal heart sounds.   No murmur heard. Respiratory: Effort normal and breath sounds normal.  GI: Soft. She exhibits no distension and no mass. There is no tenderness.  Musculoskeletal: She exhibits no edema.  Lymphadenopathy:    She has no cervical adenopathy.  Neurological: She is alert.  Skin: Skin is warm and dry.     Assessment/Plan History of iron deficiency anemia secondary to GAVE. EGD with APC of remaining GAVE  Jaicion Laurie U 11/01/2012, 11:52 AM

## 2012-11-01 NOTE — Op Note (Signed)
EGD PROCEDURE REPORT  PATIENT:  [name]  MR#:  [id number] Birthdate:  [birth year], 78 y.o., female Endoscopist:  Dr. Malissa Hippo, MD Referred By:  Dr. Catalina Pizza, MD Procedure Date: 11/01/2012  Procedure:   EGD with APC of GAVE.  Indications:  Patient is 78 year old Caucasian female history of iron deficiency anemia secondary to bleeding from gastric antral vascular ectasia. She was last evaluated and treated on 09/12/2012. She has not experienced overt GI bleeding her H&H on 10/15/2012 was 12.1 and 38.6. She is undergoing repeat ablative therapy of GAVE.            Informed Consent:  The risks, benefits, alternatives & imponderables which include, but are not limited to, bleeding, infection, perforation, drug reaction and potential missed lesion have been reviewed.  The potential for biopsy, lesion removal, esophageal dilation, etc. have also been discussed.  Questions have been answered.  All parties agreeable.  Please see history & physical in medical record for more information.  Medications:  Demerol 50 mg IV Versed 5 mg IV Cetacaine spray topically for oropharyngeal anesthesia  Description of procedure:  The endoscope was introduced through the mouth and advanced to the second portion of the duodenum without difficulty or limitations. The mucosal surfaces were surveyed very carefully during advancement of the scope and upon withdrawal.  Findings:  Esophagus:   Mucosa of the esophagus was normal. GE junction was unremarkable. Focal telangiectasia noted just below GE junction without stigmata of bleed. GEJ:  39 cm Stomach:  , Was empty and distended very well with insufflation. Folds in the proximal stomach were normal. Examination mucosa revealed few superficial telangiectasia at gastric body but these are quite extensive and dense in the antral and prepyloric region. Spontaneous bleeding noted on washing with jet of water. These lesions appeared just like they did on last exam.  Pyloric channel was patent. Angularis fundus and cardia exam and by retroflexing the scope. Angularis was unremarkable and no fundal varices are identified. Few telangiectasia at cardia were and also noted on retroflexed view. Duodenum:  Normal bulbar and post bulbar mucosa.  Therapeutic/Diagnostic Maneuvers Performed:   Most of the vascular lesions were treated with argon plasma coagulator. Bleeding noted from the majority of these lesions during therapy. Telangiectasia around the pylorus was not treated on today's session.  Complications:  None  Impression: Extensive gastric antral vascular ectasia. Appearance was same as prior to initial APC therapy of 09/12/2013. Majority of these lesions were retreated with APC except in the prepyloric region.  Recommendations:  Standard instructions given. Carafate 1 g by mouth a.c. and each bedtime for 2 weeks. Will wait for her next H&H which is to be done in February 2014.  Latiffany Harwick U  11/01/2012  12:29 PM  CC: Dr. Dwana Melena, MD & Dr. Bonnetta Barry ref. provider found         Dr. Hilbert Bible, MD

## 2012-11-04 ENCOUNTER — Encounter: Payer: Self-pay | Admitting: Internal Medicine

## 2012-11-11 ENCOUNTER — Telehealth: Payer: Self-pay | Admitting: Internal Medicine

## 2012-11-11 DIAGNOSIS — R7981 Abnormal blood-gas level: Secondary | ICD-10-CM

## 2012-11-11 DIAGNOSIS — J9610 Chronic respiratory failure, unspecified whether with hypoxia or hypercapnia: Secondary | ICD-10-CM

## 2012-11-11 NOTE — Telephone Encounter (Signed)
I received results and placed in MW lookat Spoke with pt and notified that MW out of the office this wk, but we will call her once he returns and looks at the results Pt verbalized understanding and states nothing further needed Please advise on ONO results, thanks!

## 2012-11-11 NOTE — Telephone Encounter (Signed)
Spoke with [facility] and requested results to be faxed to triage Will await fax

## 2012-11-12 ENCOUNTER — Encounter (HOSPITAL_COMMUNITY): Payer: Self-pay | Admitting: Internal Medicine

## 2012-11-18 ENCOUNTER — Encounter: Payer: Self-pay | Admitting: Internal Medicine

## 2012-11-18 NOTE — Telephone Encounter (Signed)
Spoke with patient-aware of results and okay to have sleep study done(would like to have done at [facility] as she lives closer there)pt aware order placed for asap and will get a call from our [facility] about this.

## 2012-11-18 NOTE — Telephone Encounter (Signed)
She  still desats on 2lpm so needs a formal sleep study done asap

## 2012-11-19 ENCOUNTER — Ambulatory Visit: Payer: Medicare Other | Attending: Internal Medicine | Admitting: Sleep Medicine

## 2012-11-19 VITALS — Ht 61.0 in | Wt 160.0 lb

## 2012-11-19 DIAGNOSIS — G4710 Hypersomnia, unspecified: Secondary | ICD-10-CM | POA: Insufficient documentation

## 2012-11-19 DIAGNOSIS — R7981 Abnormal blood-gas level: Secondary | ICD-10-CM

## 2012-11-19 DIAGNOSIS — J9610 Chronic respiratory failure, unspecified whether with hypoxia or hypercapnia: Secondary | ICD-10-CM

## 2012-11-19 DIAGNOSIS — G4730 Sleep apnea, unspecified: Secondary | ICD-10-CM | POA: Insufficient documentation

## 2012-11-20 NOTE — Progress Notes (Signed)
Study staged at [facility]; to be scored at [facility]

## 2012-11-21 ENCOUNTER — Telehealth: Payer: Self-pay | Admitting: *Deleted

## 2012-11-21 NOTE — Telephone Encounter (Signed)
-----   Message from Starr Sinclair to Cassell Clement, MD sent at 10/24/2012 9:18 AM ----- Juliette Alcide, please call me @ [phone] I need to know what Ray's lab results were 10/15/12. Thanks. [place] Masaki    Results given to wife 10/28/12

## 2012-11-27 ENCOUNTER — Other Ambulatory Visit: Payer: Self-pay | Admitting: Internal Medicine

## 2012-11-27 ENCOUNTER — Encounter: Payer: Self-pay | Admitting: Internal Medicine

## 2012-11-27 DIAGNOSIS — G4730 Sleep apnea, unspecified: Secondary | ICD-10-CM

## 2012-11-27 DIAGNOSIS — M47812 Spondylosis without myelopathy or radiculopathy, cervical region: Secondary | ICD-10-CM | POA: Diagnosis not present

## 2012-11-27 DIAGNOSIS — G4710 Hypersomnia, unspecified: Secondary | ICD-10-CM | POA: Diagnosis not present

## 2012-11-27 DIAGNOSIS — G545 Neuralgic amyotrophy: Secondary | ICD-10-CM | POA: Diagnosis not present

## 2012-11-27 DIAGNOSIS — M47817 Spondylosis without myelopathy or radiculopathy, lumbosacral region: Secondary | ICD-10-CM | POA: Diagnosis not present

## 2012-11-27 DIAGNOSIS — I272 Pulmonary hypertension, unspecified: Secondary | ICD-10-CM

## 2012-11-27 NOTE — Procedures (Signed)
NAMEKEIRSTEN, [name]                  ACCOUNT NO.:  [id number]  MEDICAL RECORD NO.:  [id number]          PATIENT TYPE:  OUT  LOCATION:  SLEEP CENTER                 FACILITY:  [facility]  PHYSICIAN:  Barbaraann Share, MD,FCCPDATE OF BIRTH:  12-25-34  DATE OF STUDY:  11/19/2012                           NOCTURNAL POLYSOMNOGRAM  REFERRING PHYSICIAN:  Charlaine Dalton. Wert, MD, FCCP  INDICATION FOR STUDY:  Hypersomnia with sleep apnea.  EPWORTH SLEEPINESS SCORE:  7.  MEDICATIONS:  SLEEP ARCHITECTURE:  The patient had a total sleep  time of only 164 minutes, with  very little slow-wave sleep or REM noted.  Sleep onset latency was normal at 18 minutes and REM onset was prolonged at 255 minutes.  Sleep efficiency was very poor at 42%.  RESPIRATORY DATA:  The patient was found to have no obstructive apneas and only one obstructive hypopnea, giving her an apnea-hypopnea index of only 0.4 events per hour.  There was no snoring noted during the night.  OXYGEN DATA:  The patient had spontaneous oxygen desaturation during sleep as low as 87%.  The sleep technician elected to start the patient on 1 L of oxygen, and increased this to 2 L in order to maintain saturations greater than 90%.  CARDIAC DATA:  Occasional PAC and PVC noted, but no clinically significant arrhythmias were seen.  MOVEMENT-PARASOMNIA:  The patient had no significant leg jerks or other abnormal behaviors noted.  IMPRESSIONS-RECOMMENDATIONS: 1. Small numbers of obstructive events, which do not meet the AHI     criteria for the obstructive sleep apnea syndrome.  The patient did     have a low total sleep time with decreased slow wave sleep and REM,     however, had more than adequate time to exhibit clinically     significant sleep apnea. 2. Spontaneous oxygen desaturation during the night as low as 87%     during sleep.  The patient was subsequently placed on supplemental     oxygen at 1-2 L in order to maintain saturations  greater than 90%     the rest of the     night.  Clinical correlation is suggested. 3. Occasional PAC and PVC noted, but no clinically significant     arrhythmias were seen.     Barbaraann Share, MD,FCCP Diplomate, American Board of Sleep Medicine    [facility]/MED[facility]  D:  11/27/2012 08:26:17  T:  11/27/2012 08:43:31  Job:  [id number]

## 2012-12-06 ENCOUNTER — Encounter: Payer: Self-pay | Admitting: Internal Medicine

## 2012-12-06 ENCOUNTER — Telehealth: Payer: Self-pay | Admitting: Internal Medicine

## 2012-12-06 DIAGNOSIS — I272 Pulmonary hypertension, unspecified: Secondary | ICD-10-CM

## 2012-12-06 NOTE — Telephone Encounter (Signed)
Spoke with pt and notified of recs per Dr. Sherene Sires. Pt verbalized understanding and denied any questions. Order was sent to [facility]

## 2012-12-06 NOTE — Telephone Encounter (Signed)
Per MW- ONO on 3 lpm reviewed and he rec that she now have formal sleep study Called to advise the pt She states has already had this done at [facility] on 11/19/12 Results are in Epic under procedures tab Please advise, does she need to see [facility]? And what does she need to be on right now while she sleeps? Thanks!

## 2012-12-06 NOTE — Telephone Encounter (Signed)
My mistake - needs 02 4lpm and recheck ono then ov w/in a week of study to regroup re longterm rx

## 2012-12-09 ENCOUNTER — Telehealth: Payer: Self-pay | Admitting: Internal Medicine

## 2012-12-09 NOTE — Telephone Encounter (Signed)
Pt reports that [facility] called her and was wanting to repeat ono on 3L again.  Spoke with Almyra Free and she verified that order was given to [facility] with [facility] for repeat ONO on 4L.  Pt notified that correct order was verified with [facility].

## 2012-12-23 ENCOUNTER — Encounter: Payer: Self-pay | Admitting: Internal Medicine

## 2012-12-24 ENCOUNTER — Encounter (HOSPITAL_COMMUNITY): Payer: Medicare Other | Attending: Oncology

## 2012-12-24 DIAGNOSIS — D509 Iron deficiency anemia, unspecified: Secondary | ICD-10-CM | POA: Insufficient documentation

## 2012-12-24 LAB — CBC
Hemoglobin: 11.9 g/dL — ABNORMAL LOW (ref 12.0–15.0)
MCH: 33.4 pg (ref 26.0–34.0)
MCV: 103.1 fL — ABNORMAL HIGH (ref 78.0–100.0)
RBC: 3.56 MIL/uL — ABNORMAL LOW (ref 3.87–5.11)
WBC: 7.8 10*3/uL (ref 4.0–10.5)

## 2012-12-24 NOTE — Progress Notes (Signed)
Labs drawn today for cbc,ferr,Iron IBC

## 2012-12-25 ENCOUNTER — Other Ambulatory Visit: Payer: Self-pay | Admitting: *Deleted

## 2012-12-25 LAB — IRON AND TIBC
Saturation Ratios: 17 % — ABNORMAL LOW (ref 20–55)
TIBC: 369 ug/dL (ref 250–470)
UIBC: 307 ug/dL (ref 125–400)

## 2012-12-25 LAB — FERRITIN: Ferritin: 50 ng/mL (ref 10–291)

## 2012-12-25 MED ORDER — METOPROLOL TARTRATE 25 MG PO TABS: 25.0000 mg | ORAL_TABLET | Freq: Every day | ORAL | Status: DC

## 2012-12-27 ENCOUNTER — Ambulatory Visit: Payer: Medicare Other | Admitting: Internal Medicine

## 2012-12-28 ENCOUNTER — Other Ambulatory Visit: Payer: Self-pay

## 2012-12-30 ENCOUNTER — Encounter: Payer: Self-pay | Admitting: Internal Medicine

## 2012-12-31 ENCOUNTER — Telehealth: Payer: Self-pay | Admitting: Internal Medicine

## 2012-12-31 NOTE — Telephone Encounter (Signed)
Per MW ONO on 4 lpm looks good Needs to continue 4 lpm o2 with sleep and keep rov pending Spoke with pt and notified of results/recs per Dr. Sherene Sires. Pt verbalized understanding and denied any questions.

## 2013-01-02 ENCOUNTER — Encounter: Payer: Self-pay | Admitting: Cardiology

## 2013-01-02 ENCOUNTER — Ambulatory Visit (INDEPENDENT_AMBULATORY_CARE_PROVIDER_SITE_OTHER): Payer: Medicare Other | Admitting: Internal Medicine

## 2013-01-02 ENCOUNTER — Encounter: Payer: Self-pay | Admitting: Internal Medicine

## 2013-01-02 VITALS — BP 122/64 | HR 73 | Temp 97.8°F | Ht 61.0 in | Wt 166.8 lb

## 2013-01-02 DIAGNOSIS — J9610 Chronic respiratory failure, unspecified whether with hypoxia or hypercapnia: Secondary | ICD-10-CM

## 2013-01-02 DIAGNOSIS — I272 Pulmonary hypertension, unspecified: Secondary | ICD-10-CM

## 2013-01-02 DIAGNOSIS — I2789 Other specified pulmonary heart diseases: Secondary | ICD-10-CM

## 2013-01-02 NOTE — Progress Notes (Signed)
Subjective:    Patient ID: [name], female    DOB: May 02, 1933  MRN: [id number]  HPI  26 yowf never smoker with new onset sob April 2013 referred 09/18/2012 by Dr Mariel Sleet for sob  09/18/2012 1st pulmonary ov/ Annebelle Bostic cc indolent onset sob April 2013 gradually worse through summer and into fall to point just across the room but no problems at rest or lying down, much better since dx with fe def anemia and rx with IV iron. rec  schedule echocardiogram> mod PAH We will place your record in a recall file for one year notification ideally you need a  repeat CT scan to complete the evaluation  Late add : ono RA ordered > pos desat    10/17/2012 f/u ov/Sarah-Jane Nazario cc back to baseline in terms of activity tolerance with no limiting sob- attributes this to iron rx. Denies am ha or hypersomnolence rec  titrate up 02  Sleep study > neg for osa   01/02/2013 f/u ov/Ivry Pigue cc breathing  much better since starting 02.   No obvious daytime variabilty or assoc chronic cough or cp or chest tightness, subjective wheeze overt sinus or hb symptoms. No unusual exp hx or h/o childhood pna/ asthma or premature birth to her knowledge.   Sleeping ok without nocturnal  or early am exacerbation  of respiratory  c/o's or need for noct saba. Also denies any obvious fluctuation of symptoms with weather or environmental changes or other aggravating or alleviating factors except as outlined above   ROS  The following are not active complaints unless bolded sore throat, dysphagia, dental problems, itching, sneezing,  nasal congestion or excess/ purulent secretions, ear ache,   fever, chills, sweats, unintended wt loss, pleuritic or exertional cp, hemoptysis,  orthopnea pnd or leg swelling, presyncope, palpitations, heartburn, abdominal pain, anorexia, nausea, vomiting, diarrhea  or change in bowel or urinary habits, change in stools or urine, dysuria,hematuria,  rash, arthralgias, visual complaints, headache, numbness weakness or  ataxia or problems with walking or coordination,  change in mood/affect or memory.         Objective:   Physical Exam  Wt 159 10/17/2012  > 01/05/2013  166 Wt Readings from Last 3 Encounters:  09/23/12 160 lb (72.576 kg)  09/18/12 160 lb 3.2 oz (72.666 kg)  09/12/12 159 lb (72.122 kg)    amb wf nad  HEENT: nl dentition, turbinates, and orophanx. Nl external ear canals without cough reflex   NECK :  without JVD/Nodes/TM/ nl carotid upstrokes bilaterally   LUNGS: no acc muscle use, clear to A and P bilaterally without cough on insp or exp maneuvers   CV:  RRR  no s3 or murmur or increase in P2, trace edema   ABD:  soft and nontender with nl excursion in the supine position. No bruits or organomegaly, bowel sounds nl  MS:  warm without deformities, calf tenderness, cyanosis or clubbing         08/20/12 CT  1. Scattered pulmonary nodules with a spiculated 6 x 8 mm nodule in  the left lower lobe. If the patient is at high risk for  bronchogenic carcinoma, follow-up chest CT at 3-6 months is  recommended. If the patient is at low risk for bronchogenic  carcinoma, follow-up chest CT at 6-12 months is recommended.  2. Scattered pleural parenchymal scarring with mosaic attenuation.  The latter finding can be seen with small airways disease.  3. Pulmonary arterial hypertension      Assessment & Plan:

## 2013-01-02 NOTE — Patient Instructions (Addendum)
We may need to try you on a different blood pressure medication but for now do not recommend any further changes  Please schedule a follow up visit in 3 months but call sooner if needed

## 2013-01-03 ENCOUNTER — Telehealth: Payer: Self-pay | Admitting: *Deleted

## 2013-01-03 ENCOUNTER — Telehealth: Payer: Self-pay | Admitting: Internal Medicine

## 2013-01-03 NOTE — Telephone Encounter (Signed)
Advised wife  

## 2013-01-03 NOTE — Telephone Encounter (Signed)
Will forward to [facility] so they are aware as they are the one that sent order

## 2013-01-03 NOTE — Telephone Encounter (Signed)
I will keep [name] on her amlodipine.  The alternative medicines would be likely more difficult on her lung function. In regard to [name] I would agree with reducing the dose down to just 5 mg and putting up with a slight amount of swelling.

## 2013-01-03 NOTE — Telephone Encounter (Signed)
-----   Message from Starr Sinclair to Cassell Clement, MD sent at 01/02/2013 3:33 PM ----- Juliette Alcide, I went to dr. Sherene Sires today. He think my norvasc should be changed. Also Ray was taking 10mg  ,Dr. Margo Aye decreased his to 5mg  because his feet and legs were swollen with 3 to 4 pitting edema. And his creating was 3.2 . He has been on 5mg  for 2 weeks and he still has some swelling. Let me know what he thinks. [phone]. Thanks [place]   Will forward to  Dr. Patty Sermons for review

## 2013-01-05 NOTE — Assessment & Plan Note (Addendum)
-   Rx = 2lpm started 10/17/2012  Based on ONO  RA rec 10/01/2012  >  sats <= 88%  For 8h 40 min  > 3 h 38 min < 88 @ 3lpm 12/03/12 > rec sleep study - 10/17/2012  Walked RA x 3 laps @ 185 ft each stopped due to end of study, sat 90%, no sob - 12/13/12 ONO 4lpm >  1 h 38m 44 sec @ sat < 88 rx = 4lpm at hs    Not clear why she can't maintain sats as has never smoked and has PAH so could have a pfo but doubt it would do any good to approach this with anything but 02 for now

## 2013-01-05 NOTE — Assessment & Plan Note (Signed)
-   Echo 09/24/12 >Left ventricle: The cavity size was normal. Wall thickness was normal. Systolic function was normal. The estimated ejection fraction was in the range of 55% to 60%. Wall motion was normal; there were no regional wall motion abnormalities. - Aortic valve: Mild regurgitation. - Right atrium: The atrium was mildly dilated. - Pulmonary arteries: Systolic pressure was moderately increased. PA peak pressure: 54mm Hg  - ONO  RA rec 10/01/2012  >  sats <= 88%  For 8h 40 min > rec 02 2lpm and repeat study 10/24/12 still destat < 88 for 4: 14m so sleep study ordered. - Sleep study 11/19/12 neg osa but desat > see chronic resp failure  Plan rx with 02 for now and repeat echo 6 months from last which will be due in April 2014

## 2013-01-07 ENCOUNTER — Other Ambulatory Visit: Payer: Self-pay | Admitting: *Deleted

## 2013-01-07 MED ORDER — METOPROLOL TARTRATE 25 MG PO TABS: 25.0000 mg | ORAL_TABLET | Freq: Every day | ORAL | Status: DC

## 2013-01-08 DIAGNOSIS — N390 Urinary tract infection, site not specified: Secondary | ICD-10-CM | POA: Diagnosis not present

## 2013-01-14 ENCOUNTER — Encounter: Payer: Self-pay | Admitting: Internal Medicine

## 2013-02-11 ENCOUNTER — Ambulatory Visit (INDEPENDENT_AMBULATORY_CARE_PROVIDER_SITE_OTHER): Payer: Medicare Other | Admitting: Cardiology

## 2013-02-11 ENCOUNTER — Encounter: Payer: Self-pay | Admitting: Cardiology

## 2013-02-11 VITALS — BP 126/70 | HR 69 | Ht 61.0 in | Wt 163.8 lb

## 2013-02-11 DIAGNOSIS — R002 Palpitations: Secondary | ICD-10-CM | POA: Diagnosis not present

## 2013-02-11 DIAGNOSIS — I119 Hypertensive heart disease without heart failure: Secondary | ICD-10-CM | POA: Diagnosis not present

## 2013-02-11 DIAGNOSIS — K219 Gastro-esophageal reflux disease without esophagitis: Secondary | ICD-10-CM

## 2013-02-11 LAB — BASIC METABOLIC PANEL
CO2: 28 mEq/L (ref 19–32)
Calcium: 9.2 mg/dL (ref 8.4–10.5)
Creatinine, Ser: 1 mg/dL (ref 0.4–1.2)
GFR: 54.51 mL/min — ABNORMAL LOW (ref >60.00)
Glucose, Bld: 90 mg/dL (ref 70–99)
Sodium: 140 mEq/L (ref 135–145)

## 2013-02-11 LAB — LIPID PANEL
HDL: 47.9 mg/dL (ref >39.00)
Triglycerides: 74 mg/dL (ref 0.0–149.0)

## 2013-02-11 LAB — HEPATIC FUNCTION PANEL
ALT: 17 U/L (ref 0–35)
AST: 20 U/L (ref 0–37)
Alkaline Phosphatase: 80 U/L (ref 39–117)
Total Bilirubin: 0.5 mg/dL (ref 0.3–1.2)

## 2013-02-11 MED ORDER — AMLODIPINE BESYLATE 2.5 MG PO TABS: 2.5000 mg | ORAL_TABLET | Freq: Every day | ORAL | Status: DC

## 2013-02-11 NOTE — Assessment & Plan Note (Signed)
Blood pressure has been stable on current medication.  She has been having a 6 pound weight gain and fluid retention and we will try cutting back on her amlodipine to just 2.5 mg daily

## 2013-02-11 NOTE — Patient Instructions (Addendum)
Will obtain labs today and call you with the results (lp/bmet/hfp)  DECREASE YOUR AMLODIPINE TO 2.5 MG TABLETS DAILY, RX SENT TO [facility]  Your physician wants you to follow-up in: 4 months with fasting labs (lp/bmet/hfp)  You will receive a reminder letter in the mail two months in advance. If you don't receive a letter, please call our office to schedule the follow-up appointment.

## 2013-02-11 NOTE — Progress Notes (Signed)
Quick Note:  Please report to patient. The recent labs are stable. Continue same medication and careful diet. ______ 

## 2013-02-11 NOTE — Assessment & Plan Note (Signed)
The patient has a history of palpitations and had some tachycardia palpitations last night.  She took an extra metoprolol with resolution.  We talked about how it is fine to do that if necessary.

## 2013-02-11 NOTE — Progress Notes (Signed)
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 She has a past history of angina hypertension and a history of frequent premature atrial beats. She had a recent nuclear stress test earlier this year on 12/21/11 because of exertional dyspnea. She was found to have no ischemia and her ejection fraction was 83% by Myoview. She subsequently was found to have severe iron deficiency. She was evaluated by Dr. Laurie Panda who gave her IV iron infusion and she felt better almost immediately. She had been experiencing itching of her skin which resolved and she also had been craving ice which resolved after the iron infusion. The patient had an echocardiogram on 09/24/12 which showed normal left ventricular systolic function with an ejection fraction of 55-60%. She had moderate pulmonary hypertension with a pulmonary artery pressure of 54 and she has had a subsequent sleep study and is being followed by pulmonary.  She is still using oxygen 4 L per minute at night but does not have to use it during the day when she is more active.  She states her hemoglobin was improving and when last checked was 11.8 and she continues on ferrous sulfate 325 mg once a day.   Current Outpatient Prescriptions  Medication Sig Dispense Refill  . amLODipine (NORVASC) 2.5 MG tablet Take 1 tablet (2.5 mg total) by mouth daily.  90 tablet  3  . aspirin EC 81 MG tablet Take 81 mg by mouth daily.      . digoxin (LANOXIN) 0.125 MG tablet Take 0.125 mg by mouth as directed. Take 1 tablet daily except none on Wednesday and Sunday      . ferrous sulfate 325 (65 FE) MG tablet Take 325 mg by mouth daily with breakfast.      . levothyroxine (SYNTHROID, LEVOTHROID) 125 MCG tablet Take 125 mcg by mouth daily.      . metoprolol tartrate  (LOPRESSOR) 25 MG tablet Take 1 tablet (25 mg total) by mouth daily.  90 tablet  1  . Multiple Vitamins-Minerals (CENTRUM SILVER PO) Take 1 tablet by mouth daily.       . pantoprazole (PROTONIX) 40 MG tablet Take 1 tablet (40 mg total) by mouth daily.  30 tablet  11   No current facility-administered medications for this visit.    Allergies  Allergen Reactions  . Avapro (Irbesartan) Other (See Comments)    Cough   . Clarithromycin Other (See Comments)    Unknown   . Losartan Potassium-Hctz Other (See Comments)    Unknown  . Ziac (Bisoprolol-Hydrochlorothiazide)     Thinks caused itching and cough 08/12/12  . Penicillins Rash  . Sulfa Drugs Cross Reactors Rash    Patient Active Problem List  Diagnosis  . Benign hypertensive heart disease without heart failure  . Palpitations  . Hypothyroidism  . GERD (gastroesophageal reflux disease)  . Pruritus  . Pulmonary hypertension  . Pulmonary nodule  . Iron deficiency anemia  . Chronic respiratory failure    History  Smoking status  . Never Smoker   Smokeless tobacco  . Never Used    History  Alcohol Use No    Family History  Problem Relation Age of Onset  . Hypertension Mother   . Hypertension Father   . Heart attack Father  Review of Systems: Constitutional: no fever chills diaphoresis or fatigue or change in weight.  Head and neck: no hearing loss, no epistaxis, no photophobia or visual disturbance. Respiratory: No cough, shortness of breath or wheezing. Cardiovascular: No chest pain peripheral edema, palpitations. Gastrointestinal: No abdominal distention, no abdominal pain, no change in bowel habits hematochezia or melena. Genitourinary: No dysuria, no frequency, no urgency, no nocturia. Musculoskeletal:No arthralgias, no back pain, no gait disturbance or myalgias. Neurological: No dizziness, no headaches, no numbness, no seizures, no syncope, no weakness, no tremors. Hematologic: No lymphadenopathy, no easy  bruising. Psychiatric: No confusion, no hallucinations, no sleep disturbance.    Physical Exam: Filed Vitals:   02/11/13 0958  BP: 126/70  Pulse: 69   the general appearance reveals a well-developed well-nourished alert woman in no distress.The head and neck exam reveals pupils equal and reactive.  Extraocular movements are full.  There is no scleral icterus.  The mouth and pharynx are normal.  The neck is supple.  The carotids reveal no bruits.  The jugular venous pressure is normal.  The  thyroid is not enlarged.  There is no lymphadenopathy.  The chest reveals crackling inspiratory rales bilaterally   Expansion of the chest is symmetrical.  The precordium is quiet.  The first heart sound is normal.  The second heart sound is physiologically split.  There is no murmur gallop rub or click.  There is no abnormal lift or heave.  The abdomen is soft and nontender.  The bowel sounds are normal.  The liver and spleen are not enlarged.  There are no abdominal masses.  There are no abdominal bruits.  Extremities reveal good pedal pulses.  There is no phlebitis or edema.  There is no cyanosis or clubbing.  Strength is normal and symmetrical in all extremities.  There is no lateralizing weakness.  There are no sensory deficits.  The skin is warm and dry.  There is no rash.  She complains of facial flush from the amlodipine.     Assessment / Plan: Continue same medication except diminish dose of amlodipine to 2.5 mg daily because of edema and facial flush. Blood work today is pending.  Recheck in 4 months for office visit lipid panel hepatic function panel and basal metabolic panel.

## 2013-02-11 NOTE — Assessment & Plan Note (Signed)
The patient has not been aware of any hematochezia.  Her dyspepsia has improved on proton pump inhibition.  Her stools are dark from taking daily iron

## 2013-02-12 ENCOUNTER — Telehealth: Payer: Self-pay | Admitting: *Deleted

## 2013-02-12 NOTE — Telephone Encounter (Signed)
Mailed copy of labs and left message to call if any questions  

## 2013-02-12 NOTE — Telephone Encounter (Signed)
Message copied by Burnell Blanks on Wed Feb 12, 2013  2:36 PM ------      Message from: Cassell Clement      Created: Tue Feb 11, 2013  3:49 PM       Please report to patient.  The recent labs are stable. Continue same medication and careful diet. ------

## 2013-02-25 ENCOUNTER — Other Ambulatory Visit: Payer: Self-pay

## 2013-02-25 DIAGNOSIS — Z1231 Encounter for screening mammogram for malignant neoplasm of breast: Secondary | ICD-10-CM

## 2013-02-26 DIAGNOSIS — G545 Neuralgic amyotrophy: Secondary | ICD-10-CM | POA: Diagnosis not present

## 2013-02-26 DIAGNOSIS — M47812 Spondylosis without myelopathy or radiculopathy, cervical region: Secondary | ICD-10-CM | POA: Diagnosis not present

## 2013-02-26 DIAGNOSIS — M47817 Spondylosis without myelopathy or radiculopathy, lumbosacral region: Secondary | ICD-10-CM | POA: Diagnosis not present

## 2013-03-06 ENCOUNTER — Other Ambulatory Visit (HOSPITAL_COMMUNITY): Payer: Medicare Other

## 2013-03-24 ENCOUNTER — Ambulatory Visit (HOSPITAL_COMMUNITY): Payer: Medicare Other | Admitting: Oncology

## 2013-03-24 ENCOUNTER — Encounter (HOSPITAL_COMMUNITY): Payer: Medicare Other | Attending: Oncology

## 2013-03-24 DIAGNOSIS — D509 Iron deficiency anemia, unspecified: Secondary | ICD-10-CM | POA: Insufficient documentation

## 2013-03-24 LAB — CBC
Hemoglobin: 11.9 g/dL — ABNORMAL LOW (ref 12.0–15.0)
MCH: 32.7 pg (ref 26.0–34.0)
MCHC: 33.2 g/dL (ref 30.0–36.0)
Platelets: 308 10*3/uL (ref 150–400)
RDW: 13.6 % (ref 11.5–15.5)

## 2013-03-24 LAB — IRON AND TIBC
Iron: 50 ug/dL (ref 42–135)
UIBC: 316 ug/dL (ref 125–400)

## 2013-03-24 LAB — FERRITIN: Ferritin: 57 ng/mL (ref 10–291)

## 2013-03-24 NOTE — Progress Notes (Signed)
Labs drawn today for cbc,ferr,Iron and IBC 

## 2013-03-25 ENCOUNTER — Ambulatory Visit
Admission: RE | Admit: 2013-03-25 | Discharge: 2013-03-25 | Disposition: A | Discharge status: Home / Self Care | Payer: Medicare Other | Source: Ambulatory Visit

## 2013-03-25 DIAGNOSIS — Z1231 Encounter for screening mammogram for malignant neoplasm of breast: Secondary | ICD-10-CM

## 2013-03-26 ENCOUNTER — Encounter (HOSPITAL_COMMUNITY): Payer: Self-pay | Admitting: Oncology

## 2013-03-26 ENCOUNTER — Encounter (HOSPITAL_BASED_OUTPATIENT_CLINIC_OR_DEPARTMENT_OTHER): Payer: Medicare Other | Admitting: Oncology

## 2013-03-26 VITALS — BP 129/75 | HR 62 | Temp 98.0°F | Resp 18 | Wt 163.0 lb

## 2013-03-26 DIAGNOSIS — J449 Chronic obstructive pulmonary disease, unspecified: Secondary | ICD-10-CM | POA: Diagnosis not present

## 2013-03-26 DIAGNOSIS — D508 Other iron deficiency anemias: Secondary | ICD-10-CM

## 2013-03-26 DIAGNOSIS — D801 Nonfamilial hypogammaglobulinemia: Secondary | ICD-10-CM

## 2013-03-26 DIAGNOSIS — D509 Iron deficiency anemia, unspecified: Secondary | ICD-10-CM

## 2013-03-26 DIAGNOSIS — K31819 Angiodysplasia of stomach and duodenum without bleeding: Secondary | ICD-10-CM | POA: Diagnosis not present

## 2013-03-26 NOTE — Patient Instructions (Addendum)
.  [facility] Discharge Instructions  RECOMMENDATIONS MADE BY THE CONSULTANT AND ANY TEST RESULTS WILL BE SENT TO YOUR REFERRING PHYSICIAN.  EXAM FINDINGS BY THE PHYSICIAN TODAY AND SIGNS OR SYMPTOMS TO REPORT TO CLINIC OR PRIMARY PHYSICIAN: exam good  MEDICATIONS PRESCRIBED:  Stop ferrous sulfate  INSTRUCTIONS GIVEN AND DISCUSSED: Feraheme 1020   SPECIAL INSTRUCTIONS/FOLLOW-UP: Labs in 3 months and 6 months then to see Elijah Birk  Thank you for choosing [facility] to provide your oncology and hematology care.  To afford each patient quality time with our providers, please arrive at least 15 minutes before your scheduled appointment time.  With your help, our goal is to use those 15 minutes to complete the necessary work-up to ensure our physicians have the information they need to help with your evaluation and healthcare recommendations.    Effective January 1st, 2014, we ask that you re-schedule your appointment with our physicians should you arrive 10 or more minutes late for your appointment.  We strive to give you quality time with our providers, and arriving late affects you and other patients whose appointments are after yours.    Again, thank you for choosing [facility].  Our hope is that these requests will decrease the amount of time that you wait before being seen by our physicians.       _____________________________________________________________  Should you have questions after your visit to [facility], please contact our office at [phone] between the hours of 8:30 a.m. and 5:00 p.m.  Voicemails left after 4:30 p.m. will not be returned until the following business day.  For prescription refill requests, have your pharmacy contact our office with your prescription refill request.

## 2013-03-26 NOTE — Progress Notes (Signed)
Mary Melena, MD [address] [place] [address]  Iron deficiency anemia  CURRENT THERAPY: Intermittent IV Feraheme last given on 09/30/2012.  Ferrous Sulfate 325 mg daily.  INTERVAL HISTORY: [name] 79 y.o. female returns for  regular  visit for followup of severe iron deficiency anemia secondary to GAVE Syndrome AND Immunoglobulin lab abnormalities without monoclonal spike.  [name] underwent an EGD by Dr. Karilyn Cota on 11/01/2012 which again demonstrated GAVE Syndrome which is the cause of her iron deficiency.    She is taking Ferrous Sulfate 325 mg daily.  This does not seem to maintain her iron studies.  I provided her patient education regarding this medication and I have asked her to discontinue the medication.  She reports that she is experiencing constipation and mild abdominal discomfort with the medication.  I personally reviewed and went over laboratory results with the patient. Hemoglobin is stable at 11.9 g/dL.  Her ferritin is slightly improved at 57, but her TIBC remains high-normal at 366 and % saturation remains low at 14%.    As a result of her lab work, we spent time discussing Feraheme 1020 mg IV.  We discussed the risks, benefits, alternatives, and side effects of this infusion.  She has tolerated the infusion well in the past.   [name] reports that she is feeling great.  Since starting IV Feraheme, she reports that her energy is much improved, her pica has ceased, and she feels great.    She denies any complaints and hematologic ROS questioning is negative.    Past Medical History  Diagnosis Date  . Hypertension   . Hypothyroidism   . Palpitations   . Anemia   . GAVE (gastric antral vascular ectasia) 09/12/12    has Benign hypertensive heart disease without heart failure; Palpitations; Hypothyroidism; GERD (gastroesophageal reflux disease); Pruritus; Pulmonary hypertension; Pulmonary nodule; Iron deficiency anemia; and Chronic respiratory failure on her  problem list.     is allergic to avapro; clarithromycin; losartan potassium-hctz; ziac; penicillins; and sulfa drugs cross reactors.  [name] does not currently have medications on file.  Past Surgical History  Procedure Laterality Date  . Tonsillectomy and adenoidectomy    . US echocardiography  03/12/2006    EF 55-60%  . Knee replacement rt 3 yrs ago    . Back surger x 2    . Abdominal hysterectomy    . Shoulder surgery    . Colonoscopy  07/24/2012    Procedure: COLONOSCOPY;  Surgeon: Malissa Hippo, MD;  Location: [facility];  Service: Endoscopy;  Laterality: N/A;  200  . Heel spur surgery    . Esophagogastroduodenoscopy  09/12/2012    Procedure: ESOPHAGOGASTRODUODENOSCOPY (EGD);  Surgeon: Malissa Hippo, MD;  Location: [facility];  Service: Endoscopy;  Laterality: N/A;  325-changed to 200 per Ann-pt moved up to 1030 Ann to notify pt  . Total abdominal hysterectomy    . Esophagogastroduodenoscopy  11/01/2012    Procedure: ESOPHAGOGASTRODUODENOSCOPY (EGD);  Surgeon: Malissa Hippo, MD;  Location: [facility];  Service: Endoscopy;  Laterality: N/A;  1:20  . Hot hemostasis  11/01/2012    Procedure: HOT HEMOSTASIS (ARGON PLASMA COAGULATION/BICAP);  Surgeon: Malissa Hippo, MD;  Location: [facility];  Service: Endoscopy;  Laterality: N/A;    Denies any headaches, dizziness, double vision, fevers, chills, night sweats, nausea, vomiting, diarrhea, constipation, chest pain, heart palpitations, shortness of breath, blood in stool, black tarry stool, urinary pain, urinary burning, urinary frequency, hematuria.  PHYSICAL EXAMINATION  ECOG PERFORMANCE STATUS: 0 - Asymptomatic  Filed Vitals:   03/26/13 1032  BP: 129/75  Pulse: 62  Temp: 98 F (36.7 C)  Resp: 18    GENERAL:alert, no distress, well nourished, well developed, comfortable, cooperative, obese and smiling SKIN: skin color, texture, turgor are normal, no rashes or significant lesions HEAD: Normocephalic, No  masses, lesions, tenderness or abnormalities EYES: normal, EOMI, Conjunctiva are pink and non-injected EARS: External ears normal OROPHARYNX:mucous membranes are moist  NECK: supple, trachea midline LYMPH:  not examined BREAST:not examined LUNGS: clear to auscultation  HEART: regular rate & rhythm, no murmurs, no gallops, S1 normal and S2 normal ABDOMEN:non-tender, obese and normal bowel sounds BACK: Back symmetric, no curvature., No CVA tenderness EXTREMITIES:less then 2 second capillary refill, no joint deformities, effusion, or inflammation, no edema, no skin discoloration, no clubbing, no cyanosis  NEURO: alert & oriented x 3 with fluent speech, no focal motor/sensory deficits, gait normal   LABORATORY DATA: CBC    Component Value Date/Time   WBC 7.6 03/24/2013 1013   RBC 3.64* 03/24/2013 1013   HGB 11.9* 03/24/2013 1013   HCT 35.8* 03/24/2013 1013   PLT 308 03/24/2013 1013   MCV 98.4 03/24/2013 1013   MCH 32.7 03/24/2013 1013   MCHC 33.2 03/24/2013 1013   RDW 13.6 03/24/2013 1013   LYMPHSABS 1.3 10/15/2012 1140   MONOABS 0.9 10/15/2012 1140   EOSABS 0.1 10/15/2012 1140   BASOSABS 0.1 10/15/2012 1140    Lab Results  Component Value Date   IRON 50 03/24/2013   TIBC 366 03/24/2013   FERRITIN 57 03/24/2013     RADIOGRAPHIC STUDIES:  Mm Digital Screening  03/25/2013   *RADIOLOGY REPORT*  Clinical Data: Screening.  DIGITAL BILATERAL SCREENING MAMMOGRAM WITH CAD  Comparison:  Previous exams.  FINDINGS:  ACR Breast Density Category 3: The breast tissue is heterogeneously dense.  No suspicious masses, architectural distortion, or calcifications are present.  Images were processed with CAD.  IMPRESSION: No mammographic evidence of malignancy.  A result letter of this screening mammogram will be mailed directly to the patient.  RECOMMENDATION: Screening mammogram in one year. (Code:SM-B-01Y)  BI-RADS CATEGORY 1:  Negative.   Original Report Authenticated By: Baird Lyons, M.D.      ASSESSMENT:  1. Severe iron deficiency anemia, S/P Feraheme IV 1020 mg.  Also on Ferrous Sulfate 325 mg with intolerance to this medication due to constipation and abdominal discomfort and without good absorption of this medication and will therefore discontinue.   2. GAVE Syndrome, cause of #1.  Last EGD performed by Dr. Karilyn Cota on 11/01/2012.  Followed by GI. 3. Mild hypogammaglobulinemia in the IgM class.  No monoclonal spike is noted.  4. Mild IgA elevation, again without indication of monoclonal spike.   Patient Active Problem List   Diagnosis Date Noted  . Chronic respiratory failure 10/17/2012  . Iron deficiency anemia 10/15/2012  . Pulmonary nodule 09/26/2012  . Pulmonary hypertension 09/18/2012  . Pruritus 06/20/2012  . Benign hypertensive heart disease without heart failure 06/23/2011  . Palpitations 06/23/2011  . Hypothyroidism 06/23/2011  . GERD (gastroesophageal reflux disease) 06/23/2011     PLAN:  1. I personally reviewed and went over laboratory results with the patient. 2. I personally reviewed and went over radiographic studies with the patient. 3. Next screening mammogram is due in May 2015 4. Discontinue Ferrous Sulfate 325 mg due to constipation and mild abdominal discomfort.  She is not absorbing well either.  5. Discussed the  risks, benefits, alternatives, and side effects of Feraheme 1020 mg IV.  She tolerated well in past. 6. Feraheme 1020 mg IV this week or next week.  Order signed and held.  7. Labs work in 3 months and 6 months: CBC diff, Iron/TIBC, Ferritin 8. Will administer Feraheme IV per lab results 9. Return in 6 months for follow-up.  All questions were answered. The patient knows to call the clinic with any problems, questions or concerns. We can certainly see the patient much sooner if necessary.  The patient and plan discussed with Glenford Peers, MD and he is in agreement with the aforementioned.  [name]

## 2013-03-28 ENCOUNTER — Encounter (HOSPITAL_BASED_OUTPATIENT_CLINIC_OR_DEPARTMENT_OTHER): Payer: Medicare Other

## 2013-03-28 VITALS — BP 150/67 | HR 65 | Temp 97.6°F | Resp 18

## 2013-03-28 DIAGNOSIS — D509 Iron deficiency anemia, unspecified: Secondary | ICD-10-CM | POA: Diagnosis not present

## 2013-03-28 MED ORDER — FERUMOXYTOL INJECTION 510 MG/17 ML
1020.0000 mg | Freq: Once | INTRAVENOUS | Status: AC
  Administered 2013-03-28: 1020 mg via INTRAVENOUS
  Filled 2013-03-28: qty 34

## 2013-03-28 MED ORDER — SODIUM CHLORIDE 0.9 % IV SOLN
INTRAVENOUS | Status: DC
  Administered 2013-03-28: 11:00:00 via INTRAVENOUS

## 2013-03-28 MED ORDER — SODIUM CHLORIDE 0.9 % IJ SOLN
10.0000 mL | INTRAMUSCULAR | Status: DC | PRN
  Administered 2013-03-28: 10 mL via INTRAVENOUS
  Filled 2013-03-28: qty 10

## 2013-04-01 DIAGNOSIS — D509 Iron deficiency anemia, unspecified: Secondary | ICD-10-CM | POA: Diagnosis not present

## 2013-04-01 DIAGNOSIS — I10 Essential (primary) hypertension: Secondary | ICD-10-CM | POA: Diagnosis not present

## 2013-04-02 ENCOUNTER — Encounter: Payer: Self-pay | Admitting: Internal Medicine

## 2013-04-02 ENCOUNTER — Ambulatory Visit (INDEPENDENT_AMBULATORY_CARE_PROVIDER_SITE_OTHER): Payer: Medicare Other | Admitting: Internal Medicine

## 2013-04-02 VITALS — BP 124/82 | HR 62 | Temp 97.4°F | Ht 61.25 in | Wt 162.0 lb

## 2013-04-02 DIAGNOSIS — I2789 Other specified pulmonary heart diseases: Secondary | ICD-10-CM | POA: Diagnosis not present

## 2013-04-02 DIAGNOSIS — J9610 Chronic respiratory failure, unspecified whether with hypoxia or hypercapnia: Secondary | ICD-10-CM | POA: Diagnosis not present

## 2013-04-02 DIAGNOSIS — I272 Pulmonary hypertension, unspecified: Secondary | ICD-10-CM

## 2013-04-02 NOTE — Patient Instructions (Addendum)
Please see patient coordinator before you leave today  to schedule overnight oximetry on 2lpm and use this every night for now  Please schedule a follow up visit in  6  months but call sooner if needed with pft's and cxr

## 2013-04-02 NOTE — Progress Notes (Signed)
Subjective:    Patient ID: [name], female    DOB: April 26, 1933  MRN: [id number]  HPI  46 yowf never smoker with new onset sob April 2013 referred 09/18/2012 by Dr Mariel Sleet for sob  09/18/2012 1st pulmonary ov/ [name] cc indolent onset sob April 2013 gradually worse through summer and into fall to point just across the room but no problems at rest or lying down, much better since dx with fe def anemia and rx with IV iron. rec Schedule echocardiogram> mod PAH We will place your record in a recall file for one year notification ideally you need a  repeat CT scan to complete the evaluation  Late add : ono RA ordered > pos desat    10/17/2012 f/u ov/[name] cc back to baseline in terms of activity tolerance with no limiting sob- attributes this to iron rx. Denies am ha or hypersomnolence rec  titrate up 02  Sleep study > neg for osa   01/02/2013 f/u ov/[name] cc breathing  much better since starting 02 rec No change rx   04/02/2013 f/u ov/[name] re PAH adjusted to 1lpm and feels fine on this Chief Complaint  Patient presents with  . Follow-up    Breathing has improved and is doing great. Reports no new acute complaints. Denies chest pain, chest tightness, SOB, coughing or wheezing.   not limited by breathing but 4lpm def caused ha so wasn't taking it that way anyway and doing better on 1lpm.   No obvious daytime variabilty or assoc chronic cough or cp or chest tightness, subjective wheeze overt sinus or hb symptoms. No unusual exp hx or h/o childhood pna/ asthma or premature birth to her knowledge.   Sleeping ok without nocturnal  or early am exacerbation  of respiratory  c/o's or need for noct saba. Also denies any obvious fluctuation of symptoms with weather or environmental changes or other aggravating or alleviating factors except as outlined above   Current Medications, Allergies, Past Medical History, Past Surgical History, Family History, and Social History were reviewed in [employer] record.  ROS  The following are not active complaints unless bolded sore throat, dysphagia, dental problems, itching, sneezing,  nasal congestion or excess/ purulent secretions, ear ache,   fever, chills, sweats, unintended wt loss, pleuritic or exertional cp, hemoptysis,  orthopnea pnd or leg swelling, presyncope, palpitations, heartburn, abdominal pain, anorexia, nausea, vomiting, diarrhea  or change in bowel or urinary habits, change in stools or urine, dysuria,hematuria,  rash, arthralgias, visual complaints, headache, numbness weakness or ataxia or problems with walking or coordination,  change in mood/affect or memory.             Objective:   Physical Exam  Wt 159 10/17/2012  > 01/05/2013  166 > 04/02/2013  162 Wt Readings from Last 3 Encounters:  09/23/12 160 lb (72.576 kg)  09/18/12 160 lb 3.2 oz (72.666 kg)  09/12/12 159 lb (72.122 kg)    amb wf nad  HEENT: nl dentition, turbinates, and orophanx. Nl external ear canals without cough reflex   NECK :  without JVD/Nodes/TM/ nl carotid upstrokes bilaterally   LUNGS: no acc muscle use, clear to A and P bilaterally without cough on insp or exp maneuvers   CV:  RRR  no s3 or murmur or increase in P2, trace edema   ABD:  soft and nontender with nl excursion in the supine position. No bruits or organomegaly, bowel sounds nl  MS:  warm without deformities, calf  tenderness, cyanosis or clubbing         08/20/12 CT  1. Scattered pulmonary nodules with a spiculated 6 x 8 mm nodule in  the left lower lobe. If the patient is at high risk for  bronchogenic carcinoma, follow-up chest CT at 3-6 months is  recommended. If the patient is at low risk for bronchogenic  carcinoma, follow-up chest CT at 6-12 months is recommended.  2. Scattered pleural parenchymal scarring with mosaic attenuation.  The latter finding can be seen with small airways disease.  3. Pulmonary arterial hypertension      Assessment &  Plan:

## 2013-04-03 NOTE — Assessment & Plan Note (Signed)
Needs to complete w/u for PAH with unexplained hypoxemia which is clearly not just a nocturnal event and in meantime rec 24 h 02 at whatever level she'll tolerate  Next step is v/q lung scan as prev ct chest not adequate to r/o [facility]

## 2013-04-03 NOTE — Assessment & Plan Note (Addendum)
-   Rx = 2lpm started 10/17/2012  Based on ONO  RA rec 10/01/2012  >  sats <= 88%  For 8h 40 min  > 3 h 38 min < 88 @ 3lpm 12/03/12 > rec sleep study > neg osa - 10/17/2012  Walked RA x 3 laps @ 185 ft each stopped due to end of study, sat 90%, no sob - 12/13/12 ONO 4lpm >  1 h 107m 44 sec @ sat < 88 - 04/02/2013  Walked RA x 2laps @ 185 ft each stopped due to  desat 86   change to 2lpm 24 h per day due to inability to tolerate higher levels  Chart reviewed > had not realized her previous ct was done s contrast so have not excluded TEPAH > rec V/Q to complete the w/u

## 2013-04-04 ENCOUNTER — Other Ambulatory Visit: Payer: Self-pay | Admitting: Internal Medicine

## 2013-04-04 ENCOUNTER — Telehealth: Payer: Self-pay | Admitting: Internal Medicine

## 2013-04-04 DIAGNOSIS — I272 Pulmonary hypertension, unspecified: Secondary | ICD-10-CM

## 2013-04-04 NOTE — Telephone Encounter (Signed)
Spoke with pt went over reasons stated in Dr Rolin Barry note:Needs to complete w/u for [facility] with unexplained hypoxemia which is clearly not just a nocturnal event and in meantime rec 24 h 02 at whatever level she'll tolerate  Next step is v/q lung scan as prev ct chest not adequate to r/o [facility]  Also pt ono has been scduled,advised her as soon as we get the results we would give her a call.  Pt verbally understood. Nothing further needed.

## 2013-04-04 NOTE — Telephone Encounter (Signed)
Pt would like to know why she needs the VQ scan. Pt states that she wasn't told anything about having this done.  Pt hasn't had the ONO on 2 lpm yet. Still waiting on [facility] to set this up. I am following up on this order. Rhonda J Cobb

## 2013-04-09 ENCOUNTER — Other Ambulatory Visit (HOSPITAL_COMMUNITY): Payer: Medicare Other

## 2013-04-09 ENCOUNTER — Encounter (HOSPITAL_COMMUNITY): Payer: Medicare Other

## 2013-04-10 ENCOUNTER — Encounter: Payer: Self-pay | Admitting: Cardiology

## 2013-04-10 ENCOUNTER — Telehealth: Payer: Self-pay | Admitting: *Deleted

## 2013-04-10 NOTE — Telephone Encounter (Signed)
Treat with doxycycline 100 mg twice a day #14

## 2013-04-10 NOTE — Telephone Encounter (Signed)
-----   Message from Starr Sinclair to Cassell Clement, MD sent at 04/10/2013 6:04 AM ----- Juliette Alcide, got a tick off of me that had been on for long time. He had sucked blood from me. Do I need medication ? If so call [facility]. Thanks Donte Will forward to  Dr. Patty Sermons for review

## 2013-04-10 NOTE — Telephone Encounter (Signed)
Left message to call back  

## 2013-04-10 NOTE — Telephone Encounter (Signed)
Treat with doxycycline 100 mg twice a day #14 

## 2013-04-11 ENCOUNTER — Ambulatory Visit (HOSPITAL_COMMUNITY)
Admission: RE | Admit: 2013-04-11 | Discharge: 2013-04-11 | Disposition: A | Discharge status: Home / Self Care | Payer: Medicare Other | Source: Ambulatory Visit | Attending: Internal Medicine | Admitting: Internal Medicine

## 2013-04-11 ENCOUNTER — Encounter (HOSPITAL_COMMUNITY): Payer: Self-pay

## 2013-04-11 ENCOUNTER — Ambulatory Visit (HOSPITAL_COMMUNITY): Payer: Medicare Other | Admitting: Oncology

## 2013-04-11 ENCOUNTER — Other Ambulatory Visit (HOSPITAL_COMMUNITY): Payer: Medicare Other

## 2013-04-11 DIAGNOSIS — I272 Pulmonary hypertension, unspecified: Secondary | ICD-10-CM

## 2013-04-11 DIAGNOSIS — R0602 Shortness of breath: Secondary | ICD-10-CM | POA: Insufficient documentation

## 2013-04-11 DIAGNOSIS — I517 Cardiomegaly: Secondary | ICD-10-CM | POA: Insufficient documentation

## 2013-04-11 DIAGNOSIS — I2789 Other specified pulmonary heart diseases: Secondary | ICD-10-CM | POA: Diagnosis not present

## 2013-04-11 MED ORDER — TECHNETIUM TO 99M ALBUMIN AGGREGATED
6.0000 | Freq: Once | INTRAVENOUS | Status: AC | PRN
  Administered 2013-04-11: 6 via INTRAVENOUS

## 2013-04-11 MED ORDER — TECHNETIUM TC 99M DIETHYLENETRIAME-PENTAACETIC ACID: 45.0000 | Freq: Once | INTRAVENOUS | Status: AC | PRN

## 2013-04-11 NOTE — Telephone Encounter (Signed)
Left message to call back  

## 2013-04-12 ENCOUNTER — Encounter: Payer: Self-pay | Admitting: Internal Medicine

## 2013-04-14 ENCOUNTER — Telehealth: Payer: Self-pay | Admitting: Internal Medicine

## 2013-04-14 DIAGNOSIS — J9610 Chronic respiratory failure, unspecified whether with hypoxia or hypercapnia: Secondary | ICD-10-CM

## 2013-04-14 NOTE — Progress Notes (Signed)
Quick Note:  Spoke with pt and notified of results per Dr. Wert. Pt verbalized understanding and denied any questions.  ______ 

## 2013-04-14 NOTE — Telephone Encounter (Signed)
Spoke with pt Order was sent to have ONO done through [facility] Nothing further needed per pt

## 2013-04-15 ENCOUNTER — Other Ambulatory Visit: Payer: Self-pay

## 2013-04-15 MED ORDER — DIGOXIN 125 MCG PO TABS: 0.1250 mg | ORAL_TABLET | ORAL | Status: DC

## 2013-04-16 ENCOUNTER — Telehealth: Payer: Self-pay | Admitting: Internal Medicine

## 2013-04-16 DIAGNOSIS — Z85828 Personal history of other malignant neoplasm of skin: Secondary | ICD-10-CM | POA: Diagnosis not present

## 2013-04-16 DIAGNOSIS — L570 Actinic keratosis: Secondary | ICD-10-CM | POA: Diagnosis not present

## 2013-04-16 DIAGNOSIS — D239 Other benign neoplasm of skin, unspecified: Secondary | ICD-10-CM | POA: Diagnosis not present

## 2013-04-16 DIAGNOSIS — L821 Other seborrheic keratosis: Secondary | ICD-10-CM | POA: Diagnosis not present

## 2013-04-16 DIAGNOSIS — L819 Disorder of pigmentation, unspecified: Secondary | ICD-10-CM | POA: Diagnosis not present

## 2013-04-16 DIAGNOSIS — D1801 Hemangioma of skin and subcutaneous tissue: Secondary | ICD-10-CM | POA: Diagnosis not present

## 2013-04-16 NOTE — Telephone Encounter (Signed)
Pt aware [facility] does now have the order and will be calling her asap Tobe Sos

## 2013-04-16 NOTE — Progress Notes (Signed)
Quick Note:  Spoke with pt and notified of results per Dr. Wert. Pt verbalized understanding and denied any questions.  ______ 

## 2013-04-16 NOTE — Telephone Encounter (Signed)
Message sent to [email or web address] Sally E Ottinger ° °

## 2013-04-18 NOTE — Telephone Encounter (Signed)
Spoke with patient and she does not think she needs meds at this time

## 2013-04-23 ENCOUNTER — Other Ambulatory Visit: Payer: Self-pay | Admitting: *Deleted

## 2013-04-23 MED ORDER — METOPROLOL TARTRATE 25 MG PO TABS: 25.0000 mg | ORAL_TABLET | Freq: Every day | ORAL | Status: DC

## 2013-04-24 ENCOUNTER — Telehealth (HOSPITAL_COMMUNITY): Payer: Self-pay | Admitting: Oncology

## 2013-04-26 ENCOUNTER — Other Ambulatory Visit: Payer: Self-pay | Admitting: Cardiology

## 2013-04-28 ENCOUNTER — Encounter (HOSPITAL_COMMUNITY): Payer: Self-pay | Admitting: Oncology

## 2013-04-28 ENCOUNTER — Other Ambulatory Visit (HOSPITAL_COMMUNITY): Payer: Self-pay | Admitting: Oncology

## 2013-04-29 ENCOUNTER — Encounter: Payer: Self-pay | Admitting: Internal Medicine

## 2013-04-30 ENCOUNTER — Telehealth: Payer: Self-pay | Admitting: Internal Medicine

## 2013-04-30 NOTE — Telephone Encounter (Signed)
Per MW- ONO on 2lpm was normal, needs to continue using her o2 with sleep  LMTCB to inform the pt

## 2013-05-09 ENCOUNTER — Telehealth: Payer: Self-pay | Admitting: Internal Medicine

## 2013-05-09 NOTE — Telephone Encounter (Signed)
Per MW- ONO on 2lpm was normal, needs to continue using her o2 with sleep  LMTCB to inform the pt   I spoke with patient about results and she verbalized understanding and had no questions

## 2013-06-16 ENCOUNTER — Ambulatory Visit: Payer: Medicare Other | Admitting: Cardiology

## 2013-06-16 DIAGNOSIS — H524 Presbyopia: Secondary | ICD-10-CM | POA: Diagnosis not present

## 2013-06-16 DIAGNOSIS — H52229 Regular astigmatism, unspecified eye: Secondary | ICD-10-CM | POA: Diagnosis not present

## 2013-06-16 DIAGNOSIS — H26499 Other secondary cataract, unspecified eye: Secondary | ICD-10-CM | POA: Diagnosis not present

## 2013-06-16 DIAGNOSIS — H5200 Hypermetropia, unspecified eye: Secondary | ICD-10-CM | POA: Diagnosis not present

## 2013-06-17 ENCOUNTER — Encounter (HOSPITAL_COMMUNITY): Payer: Medicare Other | Attending: Oncology

## 2013-06-17 DIAGNOSIS — D509 Iron deficiency anemia, unspecified: Secondary | ICD-10-CM | POA: Diagnosis not present

## 2013-06-17 LAB — IRON AND TIBC
Iron: 37 ug/dL — ABNORMAL LOW (ref 42–135)
TIBC: 412 ug/dL (ref 250–470)

## 2013-06-17 LAB — CBC WITH DIFFERENTIAL/PLATELET
Eosinophils Absolute: 0.3 10*3/uL (ref 0.0–0.7)
Lymphocytes Relative: 14 % (ref 12–46)
Lymphs Abs: 1.5 10*3/uL (ref 0.7–4.0)
MCH: 29.9 pg (ref 26.0–34.0)
Neutrophils Relative %: 67 % (ref 43–77)
Platelets: 377 10*3/uL (ref 150–400)
RBC: 3.28 MIL/uL — ABNORMAL LOW (ref 3.87–5.11)
WBC: 10.9 10*3/uL — ABNORMAL HIGH (ref 4.0–10.5)

## 2013-06-17 LAB — FERRITIN: Ferritin: 14 ng/mL (ref 10–291)

## 2013-06-17 NOTE — Progress Notes (Signed)
Labs drawn today for cbc/diff,Iron and IBC,ferr 

## 2013-06-18 ENCOUNTER — Other Ambulatory Visit: Payer: Self-pay

## 2013-06-18 ENCOUNTER — Other Ambulatory Visit (HOSPITAL_COMMUNITY): Payer: Self-pay | Admitting: Oncology

## 2013-06-18 DIAGNOSIS — D509 Iron deficiency anemia, unspecified: Secondary | ICD-10-CM

## 2013-06-19 ENCOUNTER — Encounter (HOSPITAL_BASED_OUTPATIENT_CLINIC_OR_DEPARTMENT_OTHER): Payer: Medicare Other

## 2013-06-19 DIAGNOSIS — D509 Iron deficiency anemia, unspecified: Secondary | ICD-10-CM

## 2013-06-19 MED ORDER — SODIUM CHLORIDE 0.9 % IV SOLN
Freq: Once | INTRAVENOUS | Status: AC
  Administered 2013-06-19: 09:00:00 via INTRAVENOUS

## 2013-06-19 MED ORDER — SODIUM CHLORIDE 0.9 % IJ SOLN
10.0000 mL | Freq: Once | INTRAMUSCULAR | Status: DC
  Filled 2013-06-19: qty 10

## 2013-06-19 MED ORDER — SODIUM CHLORIDE 0.9 % IV SOLN
1020.0000 mg | Freq: Once | INTRAVENOUS | Status: AC
  Administered 2013-06-19: 1020 mg via INTRAVENOUS
  Filled 2013-06-19: qty 34

## 2013-06-19 NOTE — Progress Notes (Signed)
Tolerated well

## 2013-06-20 ENCOUNTER — Ambulatory Visit: Payer: Medicare Other | Admitting: Cardiology

## 2013-06-20 DIAGNOSIS — L97909 Non-pressure chronic ulcer of unspecified part of unspecified lower leg with unspecified severity: Secondary | ICD-10-CM | POA: Diagnosis not present

## 2013-06-20 DIAGNOSIS — D509 Iron deficiency anemia, unspecified: Secondary | ICD-10-CM | POA: Diagnosis not present

## 2013-06-20 DIAGNOSIS — M25559 Pain in unspecified hip: Secondary | ICD-10-CM | POA: Diagnosis not present

## 2013-06-23 ENCOUNTER — Telehealth (INDEPENDENT_AMBULATORY_CARE_PROVIDER_SITE_OTHER): Payer: Self-pay | Admitting: *Deleted

## 2013-06-23 NOTE — Telephone Encounter (Signed)
[name] would like for Dr. Page Spiro to look at her lab work that was done at [facility]. Her Hemoglobin has went from 11.9 to 9.8. Everything is in EPIC. Her return phone number is [phone].

## 2013-06-24 NOTE — Telephone Encounter (Signed)
Dr.Rehman made aware. Further recommendations to follow.

## 2013-06-26 ENCOUNTER — Other Ambulatory Visit (HOSPITAL_COMMUNITY): Payer: Medicare Other

## 2013-06-27 ENCOUNTER — Telehealth (INDEPENDENT_AMBULATORY_CARE_PROVIDER_SITE_OTHER): Payer: Self-pay | Admitting: *Deleted

## 2013-06-27 NOTE — Telephone Encounter (Signed)
[name] stating she has not heard back from Dr. Karilyn Cota. Patient called because she has not heard back from Dr. Karilyn Cota. Her first message was left on 06/23/13. The return phone number is [phone]. [name] would like for Dr. Page Spiro to look at her lab work that was done at [facility]. Her Hemoglobin has went from 11.9 to 9.8. Everything is in EPIC. Her return phone number is [phone].

## 2013-06-27 NOTE — Telephone Encounter (Signed)
Talked with patient. Her H&H serum iron saturation and ferritin are down again. She is having intermittent melena She received iron infusion earlier this week. Will proceed with EGD with APC of GAVE next week

## 2013-06-30 ENCOUNTER — Telehealth (INDEPENDENT_AMBULATORY_CARE_PROVIDER_SITE_OTHER): Payer: Self-pay | Admitting: *Deleted

## 2013-06-30 ENCOUNTER — Other Ambulatory Visit (INDEPENDENT_AMBULATORY_CARE_PROVIDER_SITE_OTHER): Payer: Self-pay | Admitting: *Deleted

## 2013-06-30 DIAGNOSIS — K921 Melena: Secondary | ICD-10-CM

## 2013-06-30 DIAGNOSIS — K31819 Angiodysplasia of stomach and duodenum without bleeding: Secondary | ICD-10-CM

## 2013-06-30 NOTE — Telephone Encounter (Signed)
EGD sch'd 07/04/13, patient aware

## 2013-06-30 NOTE — Telephone Encounter (Signed)
  Procedure: egd w/ apc  Per NUR  Reason/Indication:  GAVE, melena  Has patient had this procedure before?  yes  If so, when, by whom and where?    Is there a family history of colon cancer?    Who?  What age when diagnosed?    Is patient diabetic?   no      Does patient have prosthetic heart valve?  no  Do you have a pacemaker?  no  Has patient ever had endocarditis? no  Has patient had joint replacement within last 12 months?  no  Is patient on Coumadin, Plavix and/or Aspirin? yes  Medications: see EPIC  Allergies: see EPIC  Medication Adjustment: asa 2 days  Procedure date & time: 07/04/13 at 850

## 2013-07-03 NOTE — Telephone Encounter (Signed)
agree

## 2013-07-04 ENCOUNTER — Encounter (HOSPITAL_COMMUNITY): Payer: Self-pay | Admitting: *Deleted

## 2013-07-04 ENCOUNTER — Ambulatory Visit (HOSPITAL_COMMUNITY)
Admission: RE | Admit: 2013-07-04 | Discharge: 2013-07-04 | Disposition: A | Discharge status: Home / Self Care | Payer: Medicare Other | Source: Ambulatory Visit | Attending: Internal Medicine | Admitting: Internal Medicine

## 2013-07-04 ENCOUNTER — Encounter (HOSPITAL_COMMUNITY)
Admission: RE | Disposition: A | Discharge status: Home / Self Care | Payer: Self-pay | Source: Ambulatory Visit | Attending: Internal Medicine

## 2013-07-04 DIAGNOSIS — E039 Hypothyroidism, unspecified: Secondary | ICD-10-CM | POA: Insufficient documentation

## 2013-07-04 DIAGNOSIS — Z7982 Long term (current) use of aspirin: Secondary | ICD-10-CM | POA: Diagnosis not present

## 2013-07-04 DIAGNOSIS — Z880 Allergy status to penicillin: Secondary | ICD-10-CM | POA: Insufficient documentation

## 2013-07-04 DIAGNOSIS — K319 Disease of stomach and duodenum, unspecified: Secondary | ICD-10-CM | POA: Insufficient documentation

## 2013-07-04 DIAGNOSIS — K31819 Angiodysplasia of stomach and duodenum without bleeding: Secondary | ICD-10-CM

## 2013-07-04 DIAGNOSIS — R002 Palpitations: Secondary | ICD-10-CM | POA: Diagnosis not present

## 2013-07-04 DIAGNOSIS — R141 Gas pain: Secondary | ICD-10-CM | POA: Diagnosis not present

## 2013-07-04 DIAGNOSIS — K31811 Angiodysplasia of stomach and duodenum with bleeding: Secondary | ICD-10-CM | POA: Diagnosis not present

## 2013-07-04 DIAGNOSIS — I10 Essential (primary) hypertension: Secondary | ICD-10-CM | POA: Insufficient documentation

## 2013-07-04 DIAGNOSIS — Z79899 Other long term (current) drug therapy: Secondary | ICD-10-CM | POA: Insufficient documentation

## 2013-07-04 DIAGNOSIS — Z881 Allergy status to other antibiotic agents status: Secondary | ICD-10-CM | POA: Insufficient documentation

## 2013-07-04 DIAGNOSIS — K921 Melena: Secondary | ICD-10-CM | POA: Diagnosis not present

## 2013-07-04 DIAGNOSIS — Z888 Allergy status to other drugs, medicaments and biological substances status: Secondary | ICD-10-CM | POA: Diagnosis not present

## 2013-07-04 DIAGNOSIS — D509 Iron deficiency anemia, unspecified: Secondary | ICD-10-CM | POA: Insufficient documentation

## 2013-07-04 DIAGNOSIS — Z882 Allergy status to sulfonamides status: Secondary | ICD-10-CM | POA: Insufficient documentation

## 2013-07-04 DIAGNOSIS — R142 Eructation: Secondary | ICD-10-CM | POA: Insufficient documentation

## 2013-07-04 HISTORY — PX: HOT HEMOSTASIS: SHX5433

## 2013-07-04 HISTORY — PX: ESOPHAGOGASTRODUODENOSCOPY: SHX5428

## 2013-07-04 SURGERY — EGD (ESOPHAGOGASTRODUODENOSCOPY)
Anesthesia: Moderate Sedation

## 2013-07-04 MED ORDER — MIDAZOLAM HCL 5 MG/5ML IJ SOLN
INTRAMUSCULAR | Status: AC
  Filled 2013-07-04: qty 10

## 2013-07-04 MED ORDER — MEPERIDINE HCL 25 MG/ML IJ SOLN
INTRAMUSCULAR | Status: DC | PRN
  Administered 2013-07-04 (×2): 25 mg via INTRAVENOUS

## 2013-07-04 MED ORDER — MIDAZOLAM HCL 5 MG/5ML IJ SOLN
INTRAMUSCULAR | Status: DC | PRN
  Administered 2013-07-04 (×2): 1 mg via INTRAVENOUS
  Administered 2013-07-04 (×2): 2 mg via INTRAVENOUS

## 2013-07-04 MED ORDER — SODIUM CHLORIDE 0.9 % IV SOLN
INTRAVENOUS | Status: DC
  Administered 2013-07-04: 08:00:00 via INTRAVENOUS

## 2013-07-04 MED ORDER — SUCRALFATE 1 GM/10ML PO SUSP: 1.0000 g | Freq: Four times a day (QID) | ORAL | Status: DC

## 2013-07-04 MED ORDER — MEPERIDINE HCL 50 MG/ML IJ SOLN
INTRAMUSCULAR | Status: AC
  Filled 2013-07-04: qty 1

## 2013-07-04 MED ORDER — STERILE WATER FOR IRRIGATION IR SOLN
Status: DC | PRN
  Administered 2013-07-04: 10:00:00

## 2013-07-04 MED ORDER — BUTAMBEN-TETRACAINE-BENZOCAINE 2-2-14 % EX AERO
INHALATION_SPRAY | CUTANEOUS | Status: DC | PRN
  Administered 2013-07-04: 2 via TOPICAL

## 2013-07-04 NOTE — Op Note (Signed)
EGD PROCEDURE REPORT  PATIENT:  [name]  MR#:  [id number] Birthdate:  01/25/1933, 79 y.o., female Endoscopist:  Dr. Malissa Hippo, MD Referred By:  Dr. Bonnetta Barry ref. provider found Procedure Date: 07/04/2013  Procedure:   EGD with APC of GAVE.  Indications:  Patient is 79 year old Caucasian female who has history of GI bleed, iron deficiency anemia secondary to gastric antral vascular ectasia. She has undergone 2 sessions of increasing the past. She recently experienced melena and drop in her H&H. She is therefore returning for repeat therapeutic EGD.            Informed Consent:  The risks, benefits, alternatives & imponderables which include, but are not limited to, bleeding, infection, perforation, drug reaction and potential missed lesion have been reviewed.  The potential for biopsy, lesion removal, esophageal dilation, etc. have also been discussed.  Questions have been answered.  All parties agreeable.  Please see history & physical in medical record for more information.  Medications:  Demerol 50 mg IV Versed 6 mg IV Cetacaine spray topically for oropharyngeal anesthesia  Description of procedure:  The endoscope was introduced through the mouth and advanced to the second portion of the duodenum without difficulty or limitations. The mucosal surfaces were surveyed very carefully during advancement of the scope and upon withdrawal.  Findings:  Esophagus:  Mucosa of the esophagus was normal. GEJ:  40 cm Stomach:  Stomach was empty and distended very well with insufflation. Folds in the proximal stomach were normal. Extensive telangiectasia noted in the prepyloric region with losing from 2 different areas. Prepyloric folds appear to be somewhat thickened. Prior biopsy had revealed reactive gastropathy and telangiectasia. Telangiectasia also involved mucosa at pyloric channel but it was patent. Angularis fundus and cardia were examined by retroflex of the scope and were normal. Duodenum:   Normal bulbar and post bulbar mucosa.  Therapeutic/Diagnostic Maneuvers Performed:   Most of telangiectasia werte ablated using argon plasma coagulator including two sites that were oozing blood. Mucosa close to pylorus was not treated.  Complications:  None  Impression: Gastric antral vascular ectasia with stigmata of bleed(losing from 2 sites). Majority of these lesions were ablated with argon plasma coagulator.  Recommendations:  Discontinue aspirin. Sucralfate 1 g by mouth a.c. and each bedtime for 2 weeks. Will check H&H today. Repeat session in one month.  [name]  07/04/2013  9:59 AM  CC: Dr. Catalina Pizza, MD & Dr. Bonnetta Barry ref. provider found

## 2013-07-04 NOTE — H&P (Signed)
[name] is an 79 y.o. female.   Chief Complaint: Patient's here for EGD and possible APC of gastric antral vascular ectasia(GAVE). HPI: Patient is 79 year old Caucasian female who has history of iron deficiency anemia secondary to  GI bleed from gastric antral vascular ectasia. She has undergone APC therapy twice. Last session was in December 2013 she recently developed progressive weakness and her hemoglobin checked about 3 weeks ago her hemoglobin was 9.8 g per 3 months ago was 11.9 g. Her iron stores are decreased as well. He is therefore undergoing EGD therapeutic intention. She has frequent postprandial burping but she denies heartburn nausea vomiting or epigastric pain. She is on low-dose aspirin but does not take other NSAIDs. She has received 3 infusions of iron since November 2014; last was given this month  Past Medical History  Diagnosis Date  . Hypertension   . Hypothyroidism   . Palpitations   . Anemia   . GAVE (gastric antral vascular ectasia) 09/12/12    Past Surgical History  Procedure Laterality Date  . Tonsillectomy and adenoidectomy    . US echocardiography  03/12/2006    EF 55-60%  . Knee replacement rt 3 yrs ago    . Back surger x 2    . Abdominal hysterectomy    . Shoulder surgery    . Colonoscopy  07/24/2012    Procedure: COLONOSCOPY;  Surgeon: Malissa Hippo, MD;  [facility];  Service: Endoscopy;  Laterality: N/A;  200  . Heel spur surgery    . Esophagogastroduodenoscopy  09/12/2012    Procedure: ESOPHAGOGASTRODUODENOSCOPY (EGD);  Surgeon: Malissa Hippo, MD;  [facility];  Service: Endoscopy;  Laterality: N/A;  325-changed to 200 per Ann-pt moved up to 1030 Ann to notify pt  . Total abdominal hysterectomy    . Esophagogastroduodenoscopy  11/01/2012    Procedure: ESOPHAGOGASTRODUODENOSCOPY (EGD);  Surgeon: Malissa Hippo, MD;  [facility];  Service: Endoscopy;  Laterality: N/A;  1:20  . Hot hemostasis  11/01/2012     Procedure: HOT HEMOSTASIS (ARGON PLASMA COAGULATION/BICAP);  Surgeon: Malissa Hippo, MD;  [facility];  Service: Endoscopy;  Laterality: N/A;    Family History  Problem Relation Age of Onset  . Hypertension Mother   . Hypertension Father   . Heart attack Father    Social History:  reports that she has never smoked. She has never used smokeless tobacco. She reports that she does not drink alcohol or use illicit drugs.  Allergies:  Allergies  Allergen Reactions  . Avapro [Irbesartan] Other (See Comments)    Cough   . Clarithromycin Other (See Comments)    Unknown   . Losartan Potassium-Hctz Other (See Comments)    Unknown  . Ziac [Bisoprolol-Hydrochlorothiazide]     Thinks caused itching and cough 08/12/12  . Penicillins Rash  . Sulfa Drugs Cross Reactors Rash    Medications Prior to Admission  Medication Sig Dispense Refill  . amLODipine (NORVASC) 2.5 MG tablet Take 1 tablet (2.5 mg total) by mouth daily.  90 tablet  3  . digoxin (LANOXIN) 0.125 MG tablet Take 1 tablet (0.125 mg total) by mouth as directed. Take 1 tablet daily except none on Wednesday and Sunday  30 tablet  2  . levothyroxine (SYNTHROID, LEVOTHROID) 125 MCG tablet TAKE 1 TABLET BY MOUTH DAILY  90 tablet  1  . metoprolol tartrate (LOPRESSOR) 25 MG tablet Take 1 tablet (25 mg total) by mouth daily.  90  tablet  1  . pantoprazole (PROTONIX) 40 MG tablet Take 1 tablet (40 mg total) by mouth daily.  30 tablet  11  . aspirin EC 81 MG tablet Take 81 mg by mouth daily.      . Multiple Vitamins-Minerals (CENTRUM SILVER PO) Take 1 tablet by mouth daily.         No results found for this or any previous visit (from the past 48 hour(s)). No results found.  ROS  Blood pressure 163/71, pulse 78, temperature 97.6 F (36.4 C), temperature source Oral, resp. rate 18, height 5' 1.25" (1.556 m), weight 162 lb (73.483 kg), SpO2 93.00%. Physical Exam  Constitutional: She appears well-developed and well-nourished.   HENT:  Mouth/Throat: Oropharynx is clear and moist.  Eyes: Conjunctivae are normal. No scleral icterus.  Neck: No thyromegaly present.  Cardiovascular: Normal rate, regular rhythm and normal heart sounds.   No murmur heard. Respiratory: Effort normal and breath sounds normal.  GI: Soft. She exhibits no distension and no mass. There is no tenderness.  Musculoskeletal: She exhibits no edema.  Lymphadenopathy:    She has no cervical adenopathy.  Neurological: She is alert.  Skin: Skin is warm and dry.     Assessment/Plan Melena and anemia. History of gastric antral vascular ectasia. EGD with APC therapy.  REHMAN,NAJEEB U 07/04/2013, 9:24 AM

## 2013-07-07 ENCOUNTER — Encounter (INDEPENDENT_AMBULATORY_CARE_PROVIDER_SITE_OTHER): Payer: Self-pay | Admitting: *Deleted

## 2013-07-07 ENCOUNTER — Other Ambulatory Visit (INDEPENDENT_AMBULATORY_CARE_PROVIDER_SITE_OTHER): Payer: Self-pay | Admitting: *Deleted

## 2013-07-07 ENCOUNTER — Encounter (HOSPITAL_COMMUNITY): Payer: Self-pay | Admitting: Internal Medicine

## 2013-07-07 DIAGNOSIS — K31819 Angiodysplasia of stomach and duodenum without bleeding: Secondary | ICD-10-CM

## 2013-07-08 ENCOUNTER — Telehealth (INDEPENDENT_AMBULATORY_CARE_PROVIDER_SITE_OTHER): Payer: Self-pay | Admitting: *Deleted

## 2013-07-08 NOTE — Telephone Encounter (Signed)
  Procedure: egd w/ apc  Reason/Indication:  GAVE  Has patient had this procedure before?  Yes, 8/22  If so, when, by whom and where?    Is there a family history of colon cancer?  no  Who?  What age when diagnosed?    Is patient diabetic?   no      Does patient have prosthetic heart valve?  no  Do you have a pacemaker?  no  Has patient ever had endocarditis? no  Has patient had joint replacement within last 12 months?  no  Is patient on Coumadin, Plavix and/or Aspirin? yes  Medications: see EPIC  Allergies: see EPIC  Medication Adjustment: asa 2 days  Procedure date & time: 08/06/13 at 1200

## 2013-07-09 ENCOUNTER — Telehealth (INDEPENDENT_AMBULATORY_CARE_PROVIDER_SITE_OTHER): Payer: Self-pay | Admitting: *Deleted

## 2013-07-09 DIAGNOSIS — D649 Anemia, unspecified: Secondary | ICD-10-CM

## 2013-07-09 DIAGNOSIS — K922 Gastrointestinal hemorrhage, unspecified: Secondary | ICD-10-CM | POA: Diagnosis not present

## 2013-07-09 NOTE — Telephone Encounter (Signed)
Per Dr.Rehman the patient will need to have labs drawn to have them drawn on 08/06/13

## 2013-07-10 LAB — HEMOGLOBIN AND HEMATOCRIT, BLOOD
HCT: 34.1 % — ABNORMAL LOW (ref 36.0–46.0)
Hemoglobin: 10.8 g/dL — ABNORMAL LOW (ref 12.0–15.0)

## 2013-07-10 NOTE — Telephone Encounter (Signed)
agree

## 2013-07-23 ENCOUNTER — Ambulatory Visit (INDEPENDENT_AMBULATORY_CARE_PROVIDER_SITE_OTHER): Payer: Medicare Other | Admitting: Cardiology

## 2013-07-23 VITALS — BP 212/112 | HR 66 | Ht 61.0 in | Wt 161.0 lb

## 2013-07-23 DIAGNOSIS — K31819 Angiodysplasia of stomach and duodenum without bleeding: Secondary | ICD-10-CM | POA: Diagnosis not present

## 2013-07-23 DIAGNOSIS — D62 Acute posthemorrhagic anemia: Secondary | ICD-10-CM

## 2013-07-23 DIAGNOSIS — I119 Hypertensive heart disease without heart failure: Secondary | ICD-10-CM | POA: Diagnosis not present

## 2013-07-23 DIAGNOSIS — K5900 Constipation, unspecified: Secondary | ICD-10-CM | POA: Diagnosis not present

## 2013-07-23 DIAGNOSIS — Z79899 Other long term (current) drug therapy: Secondary | ICD-10-CM | POA: Diagnosis not present

## 2013-07-23 DIAGNOSIS — R002 Palpitations: Secondary | ICD-10-CM

## 2013-07-23 DIAGNOSIS — E039 Hypothyroidism, unspecified: Secondary | ICD-10-CM

## 2013-07-23 LAB — BASIC METABOLIC PANEL
CO2: 26 mEq/L (ref 19–32)
Calcium: 9.1 mg/dL (ref 8.4–10.5)
Creatinine, Ser: 1 mg/dL (ref 0.4–1.2)
GFR: 60.44 mL/min (ref >60.00)
Glucose, Bld: 83 mg/dL (ref 70–99)
Sodium: 138 mEq/L (ref 135–145)

## 2013-07-23 LAB — HEPATIC FUNCTION PANEL
ALT: 13 U/L (ref 0–35)
AST: 22 U/L (ref 0–37)
Albumin: 3.4 g/dL — ABNORMAL LOW (ref 3.5–5.2)
Alkaline Phosphatase: 79 U/L (ref 39–117)
Total Bilirubin: 0.4 mg/dL (ref 0.3–1.2)

## 2013-07-23 LAB — LIPID PANEL
HDL: 46.7 mg/dL (ref >39.00)
Total CHOL/HDL Ratio: 3
Triglycerides: 90 mg/dL (ref 0.0–149.0)

## 2013-07-23 LAB — CBC WITH DIFFERENTIAL/PLATELET
Basophils Absolute: 0.1 10*3/uL (ref 0.0–0.1)
Eosinophils Absolute: 0.2 10*3/uL (ref 0.0–0.7)
HCT: 35.4 % — ABNORMAL LOW (ref 36.0–46.0)
Hemoglobin: 11.5 g/dL — ABNORMAL LOW (ref 12.0–15.0)
Lymphs Abs: 1 10*3/uL (ref 0.7–4.0)
MCHC: 32.4 g/dL (ref 30.0–36.0)
Monocytes Absolute: 0.8 10*3/uL (ref 0.1–1.0)
Neutro Abs: 4.8 10*3/uL (ref 1.4–7.7)
Platelets: 273 10*3/uL (ref 150.0–400.0)
RDW: 20.1 % — ABNORMAL HIGH (ref 11.5–14.6)

## 2013-07-23 LAB — TSH: TSH: 0.16 u[IU]/mL — ABNORMAL LOW (ref 0.35–5.50)

## 2013-07-23 MED ORDER — HYDRALAZINE HCL 10 MG PO TABS: 10.0000 mg | ORAL_TABLET | Freq: Two times a day (BID) | ORAL | Status: DC

## 2013-07-23 MED ORDER — POLYETHYLENE GLYCOL 3350 17 GM/SCOOP PO POWD: 17.0000 g | Freq: Every day | ORAL | Status: DC

## 2013-07-23 NOTE — Assessment & Plan Note (Signed)
The patient had more palpitations over the weekend which responded to taking an extra metoprolol.  EKG today shows no arrhythmia

## 2013-07-23 NOTE — Progress Notes (Signed)
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 She has a past history of angina hypertension and a history of frequent premature atrial beats. She had a recent nuclear stress test earlier this year on 12/21/11 because of exertional dyspnea. She was found to have no ischemia and her ejection fraction was 83% by Myoview. She subsequently was found to have severe iron deficiency. She was evaluated by Dr. Laurie Panda who gave her IV iron infusion and she felt better almost immediately. She had been experiencing itching of her skin which resolved and she also had been craving ice which resolved after the iron infusion. The patient had an echocardiogram on 09/24/12 which showed normal left ventricular systolic function with an ejection fraction of 55-60%. She had moderate pulmonary hypertension with a pulmonary artery pressure of 54 and she has had a subsequent sleep study and is being followed by pulmonary. She is still using oxygen 2 L per minute at night but does not have to use it during the day when she is more active.  Recently the patient had recurrent GI bleed.  She has a diagnosis of GAVE.  She required cauterization 2 weeks ago by her gastroenterologist.   Current Outpatient Prescriptions  Medication Sig Dispense Refill  . amLODipine (NORVASC) 2.5 MG tablet Take 1 tablet (2.5 mg total) by mouth daily.  90 tablet  3  . digoxin (LANOXIN) 0.125 MG tablet Take 1 tablet (0.125 mg total) by mouth as directed. Take 1 tablet daily except none on Wednesday and Sunday  30 tablet  2  . levothyroxine (SYNTHROID, LEVOTHROID) 125 MCG tablet TAKE 1 TABLET BY MOUTH DAILY  90 tablet  1  . metoprolol tartrate (LOPRESSOR) 25 MG tablet Take 1 tablet (25 mg total) by mouth daily.  90 tablet  1  . Multiple  Vitamins-Minerals (CENTRUM SILVER PO) Take 1 tablet by mouth daily.       . pantoprazole (PROTONIX) 40 MG tablet Take 1 tablet (40 mg total) by mouth daily.  30 tablet  11  . hydrALAZINE (APRESOLINE) 10 MG tablet Take 1 tablet (10 mg total) by mouth 2 (two) times daily.  180 tablet  3  . polyethylene glycol powder (GLYCOLAX/MIRALAX) powder Take 17 g by mouth daily.  255 g  PRN   No current facility-administered medications for this visit.    Allergies  Allergen Reactions  . Avapro [Irbesartan] Other (See Comments)    Cough   . Clarithromycin Other (See Comments)    Unknown   . Losartan Potassium-Hctz Other (See Comments)    Unknown  . Maxzide [Hydrochlorothiazide W-Triamterene]   . Ziac [Bisoprolol-Hydrochlorothiazide]     Thinks caused itching and cough 08/12/12  . Penicillins Rash  . Sulfa Drugs Cross Reactors Rash    Patient Active Problem List   Diagnosis Date Noted  . Constipation 07/23/2013  . Acute posthemorrhagic anemia 07/23/2013  . GAVE (gastric antral vascular ectasia) 07/23/2013  . Chronic respiratory failure 10/17/2012  . Iron deficiency anemia 10/15/2012  . Pulmonary nodule 09/26/2012  . Pulmonary hypertension 09/18/2012  . Pruritus 06/20/2012  . Benign hypertensive heart disease without heart failure 06/23/2011  . Palpitations 06/23/2011  . Hypothyroidism 06/23/2011  . GERD (gastroesophageal reflux disease) 06/23/2011    History  Smoking status  .  Never Smoker   Smokeless tobacco  . Never Used    History  Alcohol Use No    Family History  Problem Relation Age of Onset  . Hypertension Mother   . Hypertension Father   . Heart attack Father     Review of Systems: Constitutional: no fever chills diaphoresis or fatigue or change in weight.  Head and neck: no hearing loss, no epistaxis, no photophobia or visual disturbance. Respiratory: No cough, shortness of breath or wheezing. Cardiovascular: No chest pain peripheral edema,  palpitations. Gastrointestinal: No abdominal distention, no abdominal pain, no change in bowel habits hematochezia or melena. Genitourinary: No dysuria, no frequency, no urgency, no nocturia. Musculoskeletal:No arthralgias, no back pain, no gait disturbance or myalgias. Neurological: No dizziness, no headaches, no numbness, no seizures, no syncope, no weakness, no tremors. Hematologic: No lymphadenopathy, no easy bruising. Psychiatric: No confusion, no hallucinations, no sleep disturbance.    Physical Exam: Filed Vitals:   07/23/13 1044  BP: 212/112  Pulse: 66   repeat blood pressure is 180/80 right arm sitting relaxed.The head and neck exam reveals pupils equal and reactive.  Extraocular movements are full.  There is no scleral icterus.  The mouth and pharynx are normal.  The neck is supple.  The carotids reveal no bruits.  The jugular venous pressure is normal.  The  thyroid is not enlarged.  There is no lymphadenopathy.  The chest is clear to percussion and auscultation.  There are no rales or rhonchi.  Expansion of the chest is symmetrical.  The precordium is quiet.  The first heart sound is normal.  The second heart sound is physiologically split.  There is no murmur gallop rub or click.  There is no abnormal lift or heave.  The abdomen is soft and nontender.  The bowel sounds are normal.  The liver and spleen are not enlarged.  There are no abdominal masses.  There are no abdominal bruits.  Extremities reveal good pedal pulses.  There is no phlebitis or edema.  There is no cyanosis or clubbing.  Strength is normal and symmetrical in all extremities.  There is no lateralizing weakness.  There are no sensory deficits.  The skin is warm and dry.  There is no rash.  EKG today shows normal sinus rhythm and nonspecific ST-T wave changes.   Assessment / Plan: Continue on same medication.  We are adding hydralazine 10 mg twice a day and she will monitor her blood pressure at home  We are checking  lab work today including a CBC digoxin level vitamin B12 level TSH, lipid panel hepatic function panel and basal metabolic panel.  Recheck in 4 months for office visit and fasting lab work

## 2013-07-23 NOTE — Assessment & Plan Note (Signed)
Blood pressure was high on arrival today.  I rechecked her blood pressure myself and it was 180/80.  We will add hydralazine low-dose 10 mg twice a day and she will monitor her blood pressure at home.  She is an [employer] fund[employer].  She was surprised that her blood pressure was as high as it was.  She is having no symptoms.

## 2013-07-23 NOTE — Patient Instructions (Addendum)
Will obtain labs today and call you with the results (lp/bmet/hfp/cbc/dig level/b12/tsh)  ADD HYDRALAZINE 10 MG TWICE A DAY  MONITOR BLOOD PRESSURE AT HOME AND CALL WITH READINGS  START MIRALAX DAILY AS NEEDED  Your physician wants you to follow-up in: 4 months with fasting labs (lp/bmet/hfp)  You will receive a reminder letter in the mail two months in advance. If you don't receive a letter, please call our office to schedule the follow-up appointment.

## 2013-07-23 NOTE — Assessment & Plan Note (Signed)
The patient is clinically euthyroid.  She is on Synthroid.  We will check a TSH level today.

## 2013-07-23 NOTE — Assessment & Plan Note (Signed)
The patient is concerned that she may be developing vitamin B12 deficiency as a result of her gastric antral vascular ectasia.  We will check a vitamin B12 level.

## 2013-07-24 NOTE — Progress Notes (Signed)
Quick Note:  Please report to patient. The recent labs are stable. Continue same medication and careful diet. The lipids are good. Potassium is normal. Digoxin level is normal at 0.8. Vitamin B12 level is normal. The TSH is normal for her dose of Synthroid. The hemoglobin is stable at 11.5 ______

## 2013-07-25 ENCOUNTER — Telehealth: Payer: Self-pay | Admitting: *Deleted

## 2013-07-25 NOTE — Telephone Encounter (Signed)
Message copied by Burnell Blanks on Fri Jul 25, 2013  4:24 PM ------      Message from: Cassell Clement      Created: Thu Jul 24, 2013  2:50 PM       Please report to patient.  The recent labs are stable. Continue same medication and careful diet.  The lipids are good.  Potassium is normal.  Digoxin level is normal at 0.8.  Vitamin B12 level is normal.  The TSH is normal for her dose of Synthroid.  The hemoglobin is stable at 11.5 ------

## 2013-07-25 NOTE — Telephone Encounter (Signed)
Advised patient of lab results  

## 2013-07-29 ENCOUNTER — Encounter (HOSPITAL_COMMUNITY): Payer: Self-pay | Admitting: Pharmacy Technician

## 2013-07-29 ENCOUNTER — Encounter: Payer: Self-pay | Admitting: Cardiology

## 2013-07-31 DIAGNOSIS — H26499 Other secondary cataract, unspecified eye: Secondary | ICD-10-CM | POA: Diagnosis not present

## 2013-07-31 DIAGNOSIS — M169 Osteoarthritis of hip, unspecified: Secondary | ICD-10-CM | POA: Diagnosis not present

## 2013-07-31 DIAGNOSIS — M76899 Other specified enthesopathies of unspecified lower limb, excluding foot: Secondary | ICD-10-CM | POA: Diagnosis not present

## 2013-08-05 DIAGNOSIS — M47817 Spondylosis without myelopathy or radiculopathy, lumbosacral region: Secondary | ICD-10-CM | POA: Diagnosis not present

## 2013-08-05 DIAGNOSIS — G545 Neuralgic amyotrophy: Secondary | ICD-10-CM | POA: Diagnosis not present

## 2013-08-05 DIAGNOSIS — M47812 Spondylosis without myelopathy or radiculopathy, cervical region: Secondary | ICD-10-CM | POA: Diagnosis not present

## 2013-08-06 ENCOUNTER — Ambulatory Visit (HOSPITAL_COMMUNITY)
Admission: RE | Admit: 2013-08-06 | Discharge: 2013-08-06 | Disposition: A | Discharge status: Home / Self Care | Payer: Medicare Other | Source: Ambulatory Visit | Attending: Internal Medicine | Admitting: Internal Medicine

## 2013-08-06 ENCOUNTER — Encounter (HOSPITAL_COMMUNITY): Payer: Self-pay | Admitting: *Deleted

## 2013-08-06 ENCOUNTER — Encounter (HOSPITAL_COMMUNITY)
Admission: RE | Disposition: A | Discharge status: Home / Self Care | Payer: Self-pay | Source: Ambulatory Visit | Attending: Internal Medicine

## 2013-08-06 DIAGNOSIS — K31811 Angiodysplasia of stomach and duodenum with bleeding: Secondary | ICD-10-CM

## 2013-08-06 DIAGNOSIS — D508 Other iron deficiency anemias: Secondary | ICD-10-CM | POA: Diagnosis not present

## 2013-08-06 DIAGNOSIS — I10 Essential (primary) hypertension: Secondary | ICD-10-CM | POA: Diagnosis not present

## 2013-08-06 DIAGNOSIS — K31819 Angiodysplasia of stomach and duodenum without bleeding: Secondary | ICD-10-CM | POA: Diagnosis not present

## 2013-08-06 HISTORY — PX: ESOPHAGOGASTRODUODENOSCOPY: SHX5428

## 2013-08-06 HISTORY — PX: HOT HEMOSTASIS: SHX5433

## 2013-08-06 SURGERY — EGD (ESOPHAGOGASTRODUODENOSCOPY)
Anesthesia: Moderate Sedation

## 2013-08-06 MED ORDER — MIDAZOLAM HCL 5 MG/5ML IJ SOLN
INTRAMUSCULAR | Status: DC | PRN
  Administered 2013-08-06 (×2): 2 mg via INTRAVENOUS

## 2013-08-06 MED ORDER — MEPERIDINE HCL 50 MG/ML IJ SOLN
INTRAMUSCULAR | Status: DC | PRN
  Administered 2013-08-06 (×2): 25 mg via INTRAVENOUS

## 2013-08-06 MED ORDER — MEPERIDINE HCL 50 MG/ML IJ SOLN
INTRAMUSCULAR | Status: AC
  Filled 2013-08-06: qty 1

## 2013-08-06 MED ORDER — BUTAMBEN-TETRACAINE-BENZOCAINE 2-2-14 % EX AERO
INHALATION_SPRAY | CUTANEOUS | Status: DC | PRN
  Administered 2013-08-06: 2 via TOPICAL

## 2013-08-06 MED ORDER — MIDAZOLAM HCL 5 MG/5ML IJ SOLN
INTRAMUSCULAR | Status: AC
  Filled 2013-08-06: qty 10

## 2013-08-06 MED ORDER — SODIUM CHLORIDE 0.9 % IV SOLN
INTRAVENOUS | Status: DC
  Administered 2013-08-06: 1000 mL via INTRAVENOUS

## 2013-08-06 MED ORDER — SUCRALFATE 1 G PO TABS: 1.0000 g | ORAL_TABLET | Freq: Four times a day (QID) | ORAL | Status: DC

## 2013-08-06 NOTE — H&P (Signed)
[name] is an 79 y.o. female.   Chief Complaint: Patient is here for EGD and APC therapy. HPI: Patient is 79 year old Caucasian female with history of recurrent GI bleed, iron deficiency anemia secondary to gastric antral vascular ectasia. He underwent therapeutic EGD twice last year. Most recently she had EGD on 07/04/2013. She has not experienced melena since her last EGD. Her hemoglobin has, from 10.8 one month ago to 11.32 weeks ago. She says this is best she has felt in a long time. She denies abdominal pain nausea vomiting. She has good appetite and her weight has been stable.  Past Medical History  Diagnosis Date  . Hypertension   . Hypothyroidism   . Palpitations   . Anemia   . GAVE (gastric antral vascular ectasia) 09/12/12    Past Surgical History  Procedure Laterality Date  . Tonsillectomy and adenoidectomy    . US echocardiography  03/12/2006    EF 55-60%  . Knee replacement rt 3 yrs ago    . Back surger x 2    . Abdominal hysterectomy    . Shoulder surgery    . Colonoscopy  07/24/2012    Procedure: COLONOSCOPY;  Surgeon: Malissa Hippo, MD;  [facility];  Service: Endoscopy;  Laterality: N/A;  200  . Heel spur surgery    . Esophagogastroduodenoscopy  09/12/2012    Procedure: ESOPHAGOGASTRODUODENOSCOPY (EGD);  Surgeon: Malissa Hippo, MD;  [facility];  Service: Endoscopy;  Laterality: N/A;  325-changed to 200 per Ann-pt moved up to 1030 Ann to notify pt  . Total abdominal hysterectomy    . Esophagogastroduodenoscopy  11/01/2012    Procedure: ESOPHAGOGASTRODUODENOSCOPY (EGD);  Surgeon: Malissa Hippo, MD;  [facility];  Service: Endoscopy;  Laterality: N/A;  1:20  . Hot hemostasis  11/01/2012    Procedure: HOT HEMOSTASIS (ARGON PLASMA COAGULATION/BICAP);  Surgeon: Malissa Hippo, MD;  [facility];  Service: Endoscopy;  Laterality: N/A;  . Esophagogastroduodenoscopy N/A 07/04/2013    Procedure:  ESOPHAGOGASTRODUODENOSCOPY (EGD);  Surgeon: Malissa Hippo, MD;  [facility];  Service: Endoscopy;  Laterality: N/A;  850  . Hot hemostasis N/A 07/04/2013    Procedure: HOT HEMOSTASIS (ARGON PLASMA COAGULATION/BICAP);  Surgeon: Malissa Hippo, MD;  [facility];  Service: Endoscopy;  Laterality: N/A;    Family History  Problem Relation Age of Onset  . Hypertension Mother   . Hypertension Father   . Heart attack Father    Social History:  reports that she has never smoked. She has never used smokeless tobacco. She reports that she does not drink alcohol or use illicit drugs.  Allergies:  Allergies  Allergen Reactions  . Avapro [Irbesartan] Other (See Comments)    Cough   . Clarithromycin Other (See Comments)    Unknown   . Losartan Potassium-Hctz Other (See Comments)    Unknown  . Maxzide [Hydrochlorothiazide W-Triamterene] Other (See Comments)    Unknown   . Ziac [Bisoprolol-Hydrochlorothiazide]     Thinks caused itching and cough 08/12/12  . Penicillins Rash  . Sulfa Drugs Cross Reactors Rash    Medications Prior to Admission  Medication Sig Dispense Refill  . amLODipine (NORVASC) 2.5 MG tablet Take 1 tablet (2.5 mg total) by mouth daily.  90 tablet  3  . digoxin (LANOXIN) 0.125 MG tablet Take 0.125 mg by mouth as directed. Take 1 tablet daily except none on Wednesday and Sunday      .  hydrALAZINE (APRESOLINE) 10 MG tablet Take 1 tablet (10 mg total) by mouth 2 (two) times daily.  180 tablet  3  . levothyroxine (SYNTHROID, LEVOTHROID) 125 MCG tablet Take 125 mcg by mouth daily before breakfast.      . metoprolol tartrate (LOPRESSOR) 25 MG tablet Take 1 tablet (25 mg total) by mouth daily.  90 tablet  1  . pantoprazole (PROTONIX) 40 MG tablet Take 1 tablet (40 mg total) by mouth daily.  30 tablet  11  . polyethylene glycol powder (GLYCOLAX/MIRALAX) powder Take 17 g by mouth daily as needed.        No results found for this or any previous visit (from  the past 48 hour(s)). No results found.  ROS  Blood pressure 166/70, pulse 58, temperature 98 F (36.7 C), temperature source Oral, resp. rate 20, SpO2 94.00%. Physical Exam  Constitutional: She appears well-developed and well-nourished.  HENT:  Mouth/Throat: Oropharynx is clear and moist.  Eyes: Conjunctivae are normal. No scleral icterus.  Neck: No thyromegaly present.  Cardiovascular: Normal rate, regular rhythm and normal heart sounds.   No murmur heard. Respiratory: Effort normal and breath sounds normal.  GI: Soft. She exhibits no distension and no mass. There is no tenderness.  Musculoskeletal: She exhibits no edema.  Lymphadenopathy:    She has no cervical adenopathy.  Neurological: She is alert.  Skin: Skin is warm and dry.     Assessment/Plan History of GI bleed and iron deficiency anemia secondary to gastric antral vascular ectasia. EGD with APC therapy of GAVE.  REHMAN,NAJEEB U 08/06/2013, 11:39 AM

## 2013-08-06 NOTE — Op Note (Signed)
EGD PROCEDURE REPORT  PATIENT:  [name]  MR#:  [id number] Birthdate:  1933/10/12, 79 y.o., female Endoscopist:  Dr. Malissa Hippo, MD Referred By:  Dr. Dwana Melena, MD Procedure Date: 08/06/2013  Procedure:   EGD with APC of GAVE.  Indications:  Patient is 79 year old Caucasian female with iron deficiency anemia secondary to recurrent GI bleed from gastric antral vascular ectasia. He underwent therapeutic EGD on 07/04/2013 and has not experienced melena anymore and her hemoglobin has come up from 10.8 to 11.3 g.            Informed Consent:  The risks, benefits, alternatives & imponderables which include, but are not limited to, bleeding, infection, perforation, drug reaction and potential missed lesion have been reviewed.  The potential for biopsy, lesion removal, esophageal dilation, etc. have also been discussed.  Questions have been answered.  All parties agreeable.  Please see history & physical in medical record for more information.  Medications:  Demerol 50 mg IV Versed 5 mg IV Cetacaine spray topically for oropharyngeal anesthesia  Description of procedure:  The endoscope was introduced through the mouth and advanced to the second portion of the duodenum without difficulty or limitations. The mucosal surfaces were surveyed very carefully during advancement of the scope and upon withdrawal.  Findings:  Esophagus:  Mucosa of the esophagus was normal. The GE junction was unremarkable. Few telangiectasia were noted on the gastric site. GEJ:  39 cm Stomach:  Stomach was empty and distended very well with insufflation. Folds in the proximal stomach were normal. Examination mucosa and body was normal. Prominent antral folds with extensive telangiectasia and some areas of scarring. Telangiectasia also involved politic channel which was patent. Angularis, fundus and cardia were examined by retroflex in the scope and were unremarkable. Duodenum:  Few telangiectasia noted involving proximal  part of the bulb. Post bulbar mucosa and folds are normal.  Therapeutic/Diagnostic Maneuvers Performed:    antral telangiectasia of ablated with argon plasma coagulator. Lesions at politic channel was not treated.  Complications:  None  Impression: Gastric antral vascular ectasia. Majority of these lesions were ablated with argon plasma coagulator. Angiectasia at politic channel was not treated today. Few telangiectasia also noted below the Z line and in duodenal bulb.  Recommendations:  Full liquids today. Sucralfate 1 g by mouth a.c. and each bedtime for 2 weeks. Timing of next session will depend on her clinical course and her CBC to be done in November 2014.  REHMAN,NAJEEB U  08/06/2013  12:10 PM  CC: Dr. Catalina Pizza, MD & Dr. Bonnetta Barry ref. provider found

## 2013-08-07 ENCOUNTER — Encounter (HOSPITAL_COMMUNITY): Payer: Self-pay | Admitting: Internal Medicine

## 2013-08-11 ENCOUNTER — Telehealth (INDEPENDENT_AMBULATORY_CARE_PROVIDER_SITE_OTHER): Payer: Self-pay | Admitting: *Deleted

## 2013-08-11 DIAGNOSIS — D649 Anemia, unspecified: Secondary | ICD-10-CM

## 2013-08-11 NOTE — Telephone Encounter (Signed)
CBC noted for November 2014.

## 2013-08-11 NOTE — Telephone Encounter (Signed)
Per EGD op note, patient will need CBC in November

## 2013-08-11 NOTE — Telephone Encounter (Signed)
Per Dr.Rehman the patient will need to have labs drawn, the patient is to have drawn in November.

## 2013-08-13 DIAGNOSIS — I10 Essential (primary) hypertension: Secondary | ICD-10-CM | POA: Diagnosis not present

## 2013-08-13 DIAGNOSIS — I259 Chronic ischemic heart disease, unspecified: Secondary | ICD-10-CM | POA: Diagnosis not present

## 2013-08-13 DIAGNOSIS — E039 Hypothyroidism, unspecified: Secondary | ICD-10-CM | POA: Diagnosis not present

## 2013-08-13 DIAGNOSIS — K2970 Gastritis, unspecified, without bleeding: Secondary | ICD-10-CM | POA: Diagnosis not present

## 2013-08-13 DIAGNOSIS — Z23 Encounter for immunization: Secondary | ICD-10-CM | POA: Diagnosis not present

## 2013-08-13 DIAGNOSIS — D509 Iron deficiency anemia, unspecified: Secondary | ICD-10-CM | POA: Diagnosis not present

## 2013-09-04 ENCOUNTER — Encounter (INDEPENDENT_AMBULATORY_CARE_PROVIDER_SITE_OTHER): Payer: Self-pay | Admitting: *Deleted

## 2013-09-04 ENCOUNTER — Other Ambulatory Visit (INDEPENDENT_AMBULATORY_CARE_PROVIDER_SITE_OTHER): Payer: Self-pay | Admitting: *Deleted

## 2013-09-04 DIAGNOSIS — D649 Anemia, unspecified: Secondary | ICD-10-CM

## 2013-09-05 DIAGNOSIS — J069 Acute upper respiratory infection, unspecified: Secondary | ICD-10-CM | POA: Diagnosis not present

## 2013-09-15 ENCOUNTER — Other Ambulatory Visit: Payer: Self-pay | Admitting: Cardiology

## 2013-09-18 ENCOUNTER — Other Ambulatory Visit: Payer: Self-pay

## 2013-09-18 DIAGNOSIS — E039 Hypothyroidism, unspecified: Secondary | ICD-10-CM | POA: Diagnosis not present

## 2013-09-26 ENCOUNTER — Encounter (HOSPITAL_COMMUNITY): Payer: Medicare Other | Attending: Oncology

## 2013-09-26 DIAGNOSIS — D509 Iron deficiency anemia, unspecified: Secondary | ICD-10-CM | POA: Diagnosis not present

## 2013-09-26 LAB — IRON AND TIBC
Saturation Ratios: 7 % — ABNORMAL LOW (ref 20–55)
TIBC: 354 ug/dL (ref 250–470)
UIBC: 330 ug/dL (ref 125–400)

## 2013-09-26 LAB — CBC WITH DIFFERENTIAL/PLATELET
Basophils Absolute: 0.1 10*3/uL (ref 0.0–0.1)
Basophils Relative: 2 % — ABNORMAL HIGH (ref 0–1)
Eosinophils Relative: 3 % (ref 0–5)
HCT: 35.3 % — ABNORMAL LOW (ref 36.0–46.0)
Hemoglobin: 10.9 g/dL — ABNORMAL LOW (ref 12.0–15.0)
MCH: 27.1 pg (ref 26.0–34.0)
MCHC: 30.9 g/dL (ref 30.0–36.0)
MCV: 87.8 fL (ref 78.0–100.0)
Monocytes Absolute: 0.9 10*3/uL (ref 0.1–1.0)
Monocytes Relative: 16 % — ABNORMAL HIGH (ref 3–12)
Neutro Abs: 3.7 10*3/uL (ref 1.7–7.7)
Neutrophils Relative %: 63 % (ref 43–77)
RDW: 17.4 % — ABNORMAL HIGH (ref 11.5–15.5)

## 2013-09-26 NOTE — Progress Notes (Signed)
Labs drawn today for cbc/diff,Iron and IBC,ferr 

## 2013-09-29 ENCOUNTER — Ambulatory Visit (INDEPENDENT_AMBULATORY_CARE_PROVIDER_SITE_OTHER): Payer: Medicare Other | Admitting: Internal Medicine

## 2013-09-29 ENCOUNTER — Other Ambulatory Visit (HOSPITAL_COMMUNITY): Payer: Self-pay | Admitting: Oncology

## 2013-09-29 ENCOUNTER — Encounter: Payer: Self-pay | Admitting: Internal Medicine

## 2013-09-29 VITALS — BP 124/60 | HR 70 | Temp 98.0°F | Ht 61.0 in | Wt 161.0 lb

## 2013-09-29 DIAGNOSIS — R911 Solitary pulmonary nodule: Secondary | ICD-10-CM

## 2013-09-29 DIAGNOSIS — I2789 Other specified pulmonary heart diseases: Secondary | ICD-10-CM

## 2013-09-29 DIAGNOSIS — J9610 Chronic respiratory failure, unspecified whether with hypoxia or hypercapnia: Secondary | ICD-10-CM

## 2013-09-29 DIAGNOSIS — I272 Pulmonary hypertension, unspecified: Secondary | ICD-10-CM

## 2013-09-29 DIAGNOSIS — D509 Iron deficiency anemia, unspecified: Secondary | ICD-10-CM

## 2013-09-29 LAB — PULMONARY FUNCTION TEST

## 2013-09-29 NOTE — Patient Instructions (Addendum)
Continue 02 2lpm at bedtime  Please schedule a follow up visit in 6  months but call sooner if needed  Late add will need repeat echo next ov and cxr

## 2013-09-29 NOTE — Progress Notes (Signed)
PFT done today. 

## 2013-09-29 NOTE — Progress Notes (Signed)
Subjective:    Patient ID: [name], female    DOB: [birth year]  MRN: [id number]    Brief patient profile:  89 yowf never smoker with new onset sob April 2013 referred 09/18/2012 by Dr Mariel Sleet for sob assoc with anemia   History of Present Illness  09/18/2012 1st pulmonary ov/ Nakai Yard cc indolent onset sob April 2013 gradually worse through summer and into fall to point just across the room but no problems at rest or lying down, much better since dx with fe def anemia and rx with IV iron. rec Schedule echocardiogram > mod PAH We will place your record in a recall file for one year notification ideally you need a  repeat CT scan to complete the evaluation  Late add : ono RA ordered > pos desat    10/17/2012 f/u ov/[facility] cc back to baseline in terms of activity tolerance with no limiting sob- attributes this to iron rx. Denies am ha or hypersomnolence rec  titrate up 02  Sleep study > neg for osa   01/02/2013 f/u ov/Franz Svec cc breathing  much better since starting 02 rec No change rx   04/02/2013 f/u ov/Huong Luthi re PAH adjusted to 1lpm and feels fine on this Chief Complaint  Patient presents with  . Follow-up    Breathing has improved and is doing great. Reports no new acute complaints. Denies chest pain, chest tightness, SOB, coughing or wheezing.  not limited by breathing but 4lpm def caused ha so wasn't taking it that way anyway and doing better on 1lpm. rec 2lpm at hs, no change otherwise   09/29/2013 f/u ov/Massai Hankerson re: unexplained hypoxemia Chief Complaint  Patient presents with  . Followup with PFT    Pt states breathing is doing well and denies any co's today  Only 02 is at bedtime 2lpm  Not limited by breathing at all at ths point   No obvious daytime variabilty or assoc chronic cough or cp or chest tightness, subjective wheeze overt sinus or hb symptoms. No unusual exp hx or h/o childhood pna/ asthma or premature birth to her knowledge.   Sleeping ok without nocturnal  or  early am exacerbation  of respiratory  c/o's or need for noct saba. Also denies any obvious fluctuation of symptoms with weather or environmental changes or other aggravating or alleviating factors except as outlined above   Current Medications, Allergies, Past Medical History, Past Surgical History, Family History, and Social History were reviewed in [employer] record.  ROS  The following are not active complaints unless bolded sore throat, dysphagia, dental problems, itching, sneezing,  nasal congestion or excess/ purulent secretions, ear ache,   fever, chills, sweats, unintended wt loss, pleuritic or exertional cp, hemoptysis,  orthopnea pnd or leg swelling, presyncope, palpitations, heartburn, abdominal pain, anorexia, nausea, vomiting, diarrhea  or change in bowel or urinary habits, change in stools or urine, dysuria,hematuria,  rash, arthralgias, visual complaints, headache, numbness weakness or ataxia or problems with walking or coordination,  change in mood/affect or memory.             Objective:   Physical Exam  Wt 159 10/17/2012  > 01/05/2013  166 > 04/02/2013  162> 161 09/29/2013  Wt Readings from Last 3 Encounters:  09/23/12 160 lb (72.576 kg)  09/18/12 160 lb 3.2 oz (72.666 kg)  09/12/12 159 lb (72.122 kg)    amb wf nad  HEENT: nl dentition, turbinates, and orophanx. Nl external ear canals without cough reflex  NECK :  without JVD/Nodes/TM/ nl carotid upstrokes bilaterally   LUNGS: no acc muscle use, clear to A and P bilaterally without cough on insp or exp maneuvers   CV:  RRR  no s3 or murmur or increase in P2, trace edema   ABD:  soft and nontender with nl excursion in the supine position. No bruits or organomegaly, bowel sounds nl  MS:  warm without deformities, calf tenderness, cyanosis or clubbing         08/20/12 CT  1. Scattered pulmonary nodules with a spiculated 6 x 8 mm nodule in  the left lower lobe. If the patient is at high  risk for  bronchogenic carcinoma, follow-up chest CT at 3-6 months is  recommended. If the patient is at low risk for bronchogenic  carcinoma, follow-up chest CT at 6-12 months is recommended.  2. Scattered pleural parenchymal scarring with mosaic attenuation.  The latter finding can be seen with small airways disease.  3. Pulmonary arterial hypertension  cxr 04/16/13  Cardiomegaly without acute disease     Assessment & Plan:

## 2013-09-30 ENCOUNTER — Ambulatory Visit: Payer: Medicare Other | Admitting: Internal Medicine

## 2013-09-30 ENCOUNTER — Encounter (HOSPITAL_BASED_OUTPATIENT_CLINIC_OR_DEPARTMENT_OTHER): Payer: Medicare Other

## 2013-09-30 ENCOUNTER — Telehealth: Payer: Self-pay | Admitting: *Deleted

## 2013-09-30 VITALS — BP 138/54 | HR 76 | Temp 96.7°F | Resp 20

## 2013-09-30 DIAGNOSIS — I272 Pulmonary hypertension, unspecified: Secondary | ICD-10-CM

## 2013-09-30 DIAGNOSIS — D509 Iron deficiency anemia, unspecified: Secondary | ICD-10-CM

## 2013-09-30 MED ORDER — SODIUM CHLORIDE 0.9 % IV SOLN
1020.0000 mg | Freq: Once | INTRAVENOUS | Status: AC
  Administered 2013-09-30: 1020 mg via INTRAVENOUS
  Filled 2013-09-30: qty 34

## 2013-09-30 NOTE — Telephone Encounter (Signed)
Message copied by Christen Butter on Tue Sep 30, 2013 11:01 AM ------      Message from: Sandrea Hughs B      Created: Tue Sep 30, 2013  7:34 AM       Needs echo prior to next ov (6 months from now) ------

## 2013-09-30 NOTE — Assessment & Plan Note (Signed)
CT chest 08/20/12 - Scattered pulmonary nodules with a spiculated 6 x 8 mm nodule in  the left lower lobe  Scattered pleural parenchymal scarring with mosaic attenuation.  The latter finding can be seen with small airways disease.   Discussed in detail all the  indications, usual  risks and alternatives  relative to the benefits with patient who agrees to proceed with conservative f/u with cxr in 6 months

## 2013-09-30 NOTE — Progress Notes (Signed)
Tolerated ferraheme infusion well.  

## 2013-09-30 NOTE — Telephone Encounter (Signed)
Spoke with the pt and notified of recs per MW She verbalized understanding  Order was sent to [facility]  

## 2013-09-30 NOTE — Assessment & Plan Note (Signed)
-   Rx = 2lpm started 10/17/2012  Based on ONO  RA rec 10/01/2012  >  sats <= 88%  For 8h 40 min  > 3 h 38 min < 88 @ 3lpm 12/03/12 > rec sleep study > neg osa - 10/17/2012  Walked RA x 3 laps @ 185 ft each stopped due to end of study, sat 90%, no sob - 12/13/12 ONO 4lpm >  1 h 24m 44 sec @ sat < 88 - 04/02/2013  Walked RA x 2laps @ 185 ft each stopped due to  desat 86 - 04/21/13 ONO 2lpm 146.7 at < 88% but cannot tolerate higher settings - 09/29/13 Walked RA  2 laps @ 185 ft each stopped due to desat to 88%    rx 2lpm at hs only   Adequate control on present rx, reviewed > no change in rx needed

## 2013-09-30 NOTE — Assessment & Plan Note (Signed)
-   Echo 09/24/12 >Left ventricle: The cavity size was normal. Wall thickness was normal. Systolic function was normal. The estimated ejection fraction was in the range of 55% to 60%. Wall motion was normal; there were no regional wall motion abnormalities. - Aortic valve: Mild regurgitation. - Right atrium: The atrium was mildly dilated. - Pulmonary arteries: Systolic pressure was moderately increased. PA peak pressure: 54mm Hg  - ONO  RA rec 10/01/2012  >  sats <= 88%  For 8h 40 min > rec 02 2lpm and repeat study 10/24/12 still destat < 88 for 4: 53m so sleep study ordered. - Sleep study 11/19/12 neg osa but desat > see chronic resp failure - V/Q 04/11/13> low prob - PFT's 09/29/13 FEV1  1.06 (61%) ratio 77 and no change p B2,  DLC0 72 corrects to 102   Likely related to noct hypoxemia but can't tolerate higher levels of 02 so nothing else to offer here

## 2013-10-02 NOTE — Progress Notes (Signed)
Mary Pizza, MD  [address]  [place] [address]  Iron deficiency anemia - Plan: CBC with Differential, Iron and TIBC, Ferritin, CBC with Differential, Iron and TIBC, Ferritin, Multiple myeloma panel, serum, Kappa/lambda light chains  CURRENT THERAPY:Intermittent IV Feraheme last given on 09/30/2013.    INTERVAL HISTORY: [name] 79 y.o. female returns for  regular  visit for followup of severe iron deficiency anemia secondary to GAVE Syndrome AND Immunoglobulin lab abnormalities without monoclonal spike.  Jayme underwent an EGD by Dr. Karilyn Cota on 08/06/2013 resulting in ablation of gastric antral vascular ectasia via APC.  A few telangiectasias noted below the Z-line as well.   I personally reviewed and went over laboratory results with the patient.  Labs from 09/26/2013 demonstrates a Hgb of 10.9 g/dL, platelet count of 604, and WBC of 5.9.  Her iron studies show a serum iron of 24, TIBC 354, % sat of 7%, and ferritin of 13.  Iron deficiency anemia is the most common anemia.  Beside playing a critical role as an oxygen carrier in the heme group of hemoglobin, iron is found in many key proteins in the cells, such as cytochromes and myoglobin, so it is not unexpected that a lack of iron has effects other than anemia.  Three studies have focused on nonanemic iron deficiency leading to fatigue.  Two studies showed that oral iron supplementation reduces fatigue, with no significant change in hemoglobin levels, in women with a ferritin level of less than 50 ng/mL, and a third study showed a lessening of fatigue with parental iron administration in women with a ferritin level of 15 ng/mL or less or an iron saturation of 20% or less.   Owing to obligate iron loss through menses, women are at greater risk for iron deficiency than men.  Iron loss in all women averages 1-3 ng per day, and dietary intake is often inadequate to maintain a positive iron balance.  A 1967 study showed that 25% of  healthy, college-age women had no bone marrow iron stores and that another 33% had low stores.  Pregnancy adds to demands for iron, with requirements increasing to 6 ng per day by the end of pregnancy.  Athletes are another group at risk for iron deficiency.  Gastrointestinal tract blood is the source of iron loss, and exercise-induced hemolysis leads to urinary iron losses.  Decreased absorption of iron has also been implicated as a cause of iron deficiency, because of levels of hepcidin are often elevated in athletes owing to training-induced inflammation.    Obesity and its surgical treatment are also at risk factors for iron deficiency.  Obese patients are often iron-deficient, with increased hepcidin level being implicated in decreased absorption.  After bariatric surgery, the incidence of iron deficiency can be as high as 50%.  Because the main site of iron absorption is the duodenum, surgeries that involve bypassing this part of the bowel are associated with an increased incidence of iron deficiency.  However, iron deficiency is seen as a sequela of most types of bariatric surgery.    -NEJM Volume 371, No 14, pg 1325-1326  [name] reports that she feels better following the IV iron infusion.  She was advised to refrain from PO iron as she has intolerance to it.  We will provide her with IV Feraheme as indicated. She is followed by GI, Dr. Karilyn Cota, for GAVE syndrome.  She denies any blood in stool, black tarry stool, hematuria, hemoptysis, and blood with  emesis.  She denies any nausea and vomiting.   Hematologically, she denies any complaints and ROS questioning is negative.   Past Medical History  Diagnosis Date  . Hypertension   . Hypothyroidism   . Palpitations   . Anemia   . GAVE (gastric antral vascular ectasia) 09/12/12    has Benign hypertensive heart disease without heart failure; Palpitations; Hypothyroidism; GERD (gastroesophageal reflux disease); Pruritus; Pulmonary hypertension;  Pulmonary nodule; Iron deficiency anemia; Chronic respiratory failure; Constipation; Acute posthemorrhagic anemia; and GAVE (gastric antral vascular ectasia) on her problem list.     is allergic to avapro; clarithromycin; losartan potassium-hctz; maxzide; ziac; penicillins; and sulfa drugs cross reactors.  Ms. Tagle does not currently have medications on file.  Past Surgical History  Procedure Laterality Date  . Tonsillectomy and adenoidectomy    . US echocardiography  03/12/2006    EF 55-60%  . Knee replacement rt 3 yrs ago    . Back surger x 2    . Abdominal hysterectomy    . Shoulder surgery    . Colonoscopy  07/24/2012    Procedure: COLONOSCOPY;  Surgeon: Malissa Hippo, MD;  [facility]: [facility];  Service: Endoscopy;  Laterality: N/A;  200  . Heel spur surgery    . Esophagogastroduodenoscopy  09/12/2012    Procedure: ESOPHAGOGASTRODUODENOSCOPY (EGD);  Surgeon: Malissa Hippo, MD;  Location: [facility];  Service: Endoscopy;  Laterality: N/A;  325-changed to 200 per Ann-pt moved up to 1030 Ann to notify pt  . Total abdominal hysterectomy    . Esophagogastroduodenoscopy  11/01/2012    Procedure: ESOPHAGOGASTRODUODENOSCOPY (EGD);  Surgeon: Malissa Hippo, MD;  Location: [facility];  Service: Endoscopy;  Laterality: N/A;  1:20  . Hot hemostasis  11/01/2012    Procedure: HOT HEMOSTASIS (ARGON PLASMA COAGULATION/BICAP);  Surgeon: Malissa Hippo, MD;  Location: [facility];  Service: Endoscopy;  Laterality: N/A;  . Esophagogastroduodenoscopy N/A 07/04/2013    Procedure: ESOPHAGOGASTRODUODENOSCOPY (EGD);  Surgeon: Malissa Hippo, MD;  [facility]: [facility];  Service: Endoscopy;  Laterality: N/A;  850  . Hot hemostasis N/A 07/04/2013    Procedure: HOT HEMOSTASIS (ARGON PLASMA COAGULATION/BICAP);  Surgeon: Malissa Hippo, MD;  [facility]: [facility];  Service: Endoscopy;  Laterality: N/A;  . Esophagogastroduodenoscopy N/A 08/06/2013    Procedure: ESOPHAGOGASTRODUODENOSCOPY  (EGD);  Surgeon: Malissa Hippo, MD;  [facility];  Service: Endoscopy;  Laterality: N/A;  1200  . Hot hemostasis N/A 08/06/2013    Procedure: HOT HEMOSTASIS (ARGON PLASMA COAGULATION/BICAP);  Surgeon: Malissa Hippo, MD;  [facility]: [facility];  Service: Endoscopy;  Laterality: N/A;    Denies any headaches, dizziness, double vision, fevers, chills, night sweats, nausea, vomiting, diarrhea, constipation, chest pain, heart palpitations, shortness of breath, blood in stool, black tarry stool, urinary pain, urinary burning, urinary frequency, hematuria.   PHYSICAL EXAMINATION  ECOG PERFORMANCE STATUS: 0 - Asymptomatic  Filed Vitals:   10/03/13 1000  BP: 121/76  Pulse: 76  Temp: 97.4 F (36.3 C)  Resp: 18    GENERAL:alert, no distress, well nourished, well developed, comfortable, cooperative and smiling SKIN: skin color, texture, turgor are normal, no rashes or significant lesions HEAD: Normocephalic, No masses, lesions, tenderness or abnormalities EYES: normal, PERRLA, EOMI, Conjunctiva are pink and non-injected EARS: External ears normal OROPHARYNX:mucous membranes are moist  NECK: supple, no adenopathy, trachea midline LYMPH:  no palpable lymphadenopathy BREAST:not examined LUNGS: clear to auscultation  HEART: regular rate & rhythm, no murmurs and  no gallops ABDOMEN:abdomen soft, non-tender and normal bowel sounds BACK: Back symmetric, no curvature. EXTREMITIES:less then 2 second capillary refill, no joint deformities, effusion, or inflammation, no skin discoloration  NEURO: alert & oriented x 3 with fluent speech, no focal motor/sensory deficits, gait normal    LABORATORY DATA: CBC    Component Value Date/Time   WBC 5.9 09/26/2013 0957   RBC 4.02 09/26/2013 0957   RBC 4.12 08/20/2012 1725   HGB 10.9* 09/26/2013 0957   HCT 35.3* 09/26/2013 0957   PLT 437* 09/26/2013 0957   MCV 87.8 09/26/2013 0957   MCH 27.1 09/26/2013 0957   MCHC 30.9 09/26/2013 0957    RDW 17.4* 09/26/2013 0957   LYMPHSABS 1.0 09/26/2013 0957   MONOABS 0.9 09/26/2013 0957   EOSABS 0.2 09/26/2013 0957   BASOSABS 0.1 09/26/2013 0957      Chemistry      Component Value Date/Time   NA 138 07/23/2013 1139   K 3.7 07/23/2013 1139   CL 105 07/23/2013 1139   CO2 26 07/23/2013 1139   BUN 15 07/23/2013 1139   CREATININE 1.0 07/23/2013 1139      Component Value Date/Time   CALCIUM 9.1 07/23/2013 1139   ALKPHOS 79 07/23/2013 1139   AST 22 07/23/2013 1139   ALT 13 07/23/2013 1139   BILITOT 0.4 07/23/2013 1139     Lab Results  Component Value Date   IRON 24* 09/26/2013   TIBC 354 09/26/2013   FERRITIN 13 09/26/2013     ASSESSMENT:  1. Severe iron deficiency anemia, S/P Feraheme IV 1020 mg on 09/30/2013.  Ferrous Sulfate 325 mg failure with intolerance to this medication due to constipation and abdominal discomfort and without good absorption of this medication and will therefore discontinue.  2. GAVE Syndrome, cause of #1. Last EGD performed by Dr. Karilyn Cota on 08/06/2013. Followed by GI.  3. Mild hypogammaglobulinemia in the IgM class. No monoclonal spike is noted.  4. Mild IgA elevation, again without indication of monoclonal spike.   Patient Active Problem List   Diagnosis Date Noted  . Constipation 07/23/2013  . Acute posthemorrhagic anemia 07/23/2013  . GAVE (gastric antral vascular ectasia) 07/23/2013  . Chronic respiratory failure 10/17/2012  . Iron deficiency anemia 10/15/2012  . Pulmonary nodule 09/26/2012  . Pulmonary hypertension 09/18/2012  . Pruritus 06/20/2012  . Benign hypertensive heart disease without heart failure 06/23/2011  . Palpitations 06/23/2011  . Hypothyroidism 06/23/2011  . GERD (gastroesophageal reflux disease) 06/23/2011    PLAN:  1. I personally reviewed and went over laboratory results with the patient. 2. IV Feraheme 1020 mg given on 09/30/2013 3. Labs in 6 weeks: CBC diff, Iron/TIBC, Ferritin 4. Labs in 12 weeks: CBC diff, Iron/TIBC,  Ferritin, MM panel 5. Patient education regarding iron deficiency anemia 6. Continue follow-up with GI as directed.  7. She has received influenza vaccine, pneumovax, and shingles vaccine in October 2014.  8. Return in 3 months for follow-up    THERAPY PLAN:  I personally reviewed and went over laboratory results with the patient.  We will check her iron studies in 6 weeks and 12 weeks to evaluate response to IV therapy.   All questions were answered. The patient knows to call the clinic with any problems, questions or concerns. We can certainly see the patient much sooner if necessary.  Patient and plan discussed with Dr. Alla German and he is in agreement with the aforementioned.   Jahmar Mckelvy

## 2013-10-03 ENCOUNTER — Encounter (HOSPITAL_COMMUNITY): Payer: Self-pay | Admitting: Oncology

## 2013-10-03 ENCOUNTER — Encounter (HOSPITAL_BASED_OUTPATIENT_CLINIC_OR_DEPARTMENT_OTHER): Payer: Medicare Other | Admitting: Oncology

## 2013-10-03 ENCOUNTER — Telehealth (INDEPENDENT_AMBULATORY_CARE_PROVIDER_SITE_OTHER): Payer: Self-pay | Admitting: *Deleted

## 2013-10-03 VITALS — BP 121/76 | HR 76 | Temp 97.4°F | Resp 18 | Wt 162.2 lb

## 2013-10-03 DIAGNOSIS — D509 Iron deficiency anemia, unspecified: Secondary | ICD-10-CM | POA: Diagnosis not present

## 2013-10-03 DIAGNOSIS — K31811 Angiodysplasia of stomach and duodenum with bleeding: Secondary | ICD-10-CM

## 2013-10-03 DIAGNOSIS — K921 Melena: Secondary | ICD-10-CM | POA: Diagnosis not present

## 2013-10-03 NOTE — Telephone Encounter (Signed)
   Diagnosis:    Result(s)   Card 1: Positive    Card 2: Positive      Completed by: Jehad Bisono,LPN   HEMOCCULT SENSA DEVELOPER: LOT#: [id number]  EXPIRATION DATE: 2015-05   HEMOCCULT SENSA CARD:  LOT#:[id number]/ [id number]   EXPIRATION DATE: 07/2014   CARD CONTROL RESULTS:  POSITIVE: Positive NEGATIVE: Negative    ADDITIONAL COMMENTS: Patient was called and made aware of her results,message was left on her voicemail at home.

## 2013-10-03 NOTE — Patient Instructions (Signed)
[facility] Discharge Instructions  RECOMMENDATIONS MADE BY THE CONSULTANT AND ANY TEST RESULTS WILL BE SENT TO YOUR REFERRING PHYSICIAN.  EXAM FINDINGS BY THE PHYSICIAN TODAY AND SIGNS OR SYMPTOMS TO REPORT TO CLINIC OR PRIMARY PHYSICIAN: Exam and findings as discussed by Mary Anes, PA-C. Report increased ice intake, shortness of breath or other problems.  MEDICATIONS PRESCRIBED:  none  INSTRUCTIONS/FOLLOW-UP: Blood work in 6 weeks and 12 weeks. To be seen after labs in 12 weeks.  Thank you for choosing [facility] to provide your oncology and hematology care.  To afford each patient quality time with our providers, please arrive at least 15 minutes before your scheduled appointment time.  With your help, our goal is to use those 15 minutes to complete the necessary work-up to ensure our physicians have the information they need to help with your evaluation and healthcare recommendations.    Effective January 1st, 2014, we ask that you re-schedule your appointment with our physicians should you arrive 10 or more minutes late for your appointment.  We strive to give you quality time with our providers, and arriving late affects you and other patients whose appointments are after yours.    Again, thank you for choosing [facility].  Our hope is that these requests will decrease the amount of time that you wait before being seen by our physicians.       _____________________________________________________________  Should you have questions after your visit to [facility], please contact our office at [phone] between the hours of 8:30 a.m. and 5:00 p.m.  Voicemails left after 4:30 p.m. will not be returned until the following business day.  For prescription refill requests, have your pharmacy contact our office with your prescription refill request.

## 2013-10-06 ENCOUNTER — Telehealth (INDEPENDENT_AMBULATORY_CARE_PROVIDER_SITE_OTHER): Payer: Self-pay | Admitting: *Deleted

## 2013-10-06 NOTE — Telephone Encounter (Signed)
   Diagnosis:    Result(s)   Card 1: Negative          Completed by: Larose Hires, LPN   HEMOCCULT SENSA DEVELOPER: LOT#:  [id number] EXPIRATION DATE: 2015-05   HEMOCCULT SENSA CARD:  LOT#:  [id number] EXPIRATION DATE: [id number]   CARD CONTROL RESULTS:  POSITIVE: Postiive NEGATIVE: Negative    ADDITIONAL COMMENTS: Patient brought by the office on Friday,October 03 2013, both were positive. Patient called and made aware of this result.

## 2013-10-07 NOTE — Telephone Encounter (Signed)
Already addressed

## 2013-10-07 NOTE — Telephone Encounter (Signed)
Two out of three Hemoccults are positive. Patient required another iron infusion. Last EGD with APC was on 08/06/2013. Patient interested in further treatment hoping to slow down the blood loss. Please schedule EGD after October 20, 2013.

## 2013-10-08 ENCOUNTER — Other Ambulatory Visit (INDEPENDENT_AMBULATORY_CARE_PROVIDER_SITE_OTHER): Payer: Self-pay | Admitting: *Deleted

## 2013-10-08 ENCOUNTER — Telehealth (INDEPENDENT_AMBULATORY_CARE_PROVIDER_SITE_OTHER): Payer: Self-pay | Admitting: *Deleted

## 2013-10-08 DIAGNOSIS — K31819 Angiodysplasia of stomach and duodenum without bleeding: Secondary | ICD-10-CM

## 2013-10-08 DIAGNOSIS — K922 Gastrointestinal hemorrhage, unspecified: Secondary | ICD-10-CM

## 2013-10-08 NOTE — Telephone Encounter (Signed)
agree

## 2013-10-08 NOTE — Telephone Encounter (Signed)
  Procedure: egd w/ apc  Reason/Indication:  Gave, gi bleed  Has patient had this procedure before?  Yes, 9/14  If so, when, by whom and where?    Is there a family history of colon cancer?    Who?  What age when diagnosed?    Is patient diabetic?   no      Does patient have prosthetic heart valve?  no  Do you have a pacemaker?  no  Has patient ever had endocarditis? no  Has patient had joint replacement within last 12 months?  no  Does patient tend to be constipated or take laxatives?   Is patient on Coumadin, Plavix and/or Aspirin? yes  Medications: see EPIC  Allergies: see EPIC  Medication Adjustment: asa 2 days  Procedure date & time: 10/31/13 at 125

## 2013-10-08 NOTE — Telephone Encounter (Signed)
EGD w/ APC sch'd 10/31/13, patient aware

## 2013-10-10 ENCOUNTER — Encounter: Payer: Self-pay | Admitting: Internal Medicine

## 2013-10-15 ENCOUNTER — Other Ambulatory Visit: Payer: Self-pay | Admitting: Cardiology

## 2013-10-23 ENCOUNTER — Encounter (HOSPITAL_COMMUNITY): Payer: Self-pay | Admitting: Pharmacy Technician

## 2013-10-27 ENCOUNTER — Other Ambulatory Visit: Payer: Self-pay | Admitting: Cardiology

## 2013-10-29 DIAGNOSIS — J209 Acute bronchitis, unspecified: Secondary | ICD-10-CM | POA: Diagnosis not present

## 2013-10-31 ENCOUNTER — Encounter (HOSPITAL_COMMUNITY): Payer: Self-pay | Admitting: *Deleted

## 2013-10-31 ENCOUNTER — Ambulatory Visit (HOSPITAL_COMMUNITY)
Admission: RE | Admit: 2013-10-31 | Discharge: 2013-10-31 | Disposition: A | Discharge status: Home / Self Care | Payer: Medicare Other | Source: Ambulatory Visit | Attending: Internal Medicine | Admitting: Internal Medicine

## 2013-10-31 ENCOUNTER — Encounter (HOSPITAL_COMMUNITY)
Admission: RE | Disposition: A | Discharge status: Home / Self Care | Payer: Self-pay | Source: Ambulatory Visit | Attending: Internal Medicine

## 2013-10-31 DIAGNOSIS — D509 Iron deficiency anemia, unspecified: Secondary | ICD-10-CM

## 2013-10-31 DIAGNOSIS — K922 Gastrointestinal hemorrhage, unspecified: Secondary | ICD-10-CM

## 2013-10-31 DIAGNOSIS — K31811 Angiodysplasia of stomach and duodenum with bleeding: Secondary | ICD-10-CM | POA: Insufficient documentation

## 2013-10-31 DIAGNOSIS — I10 Essential (primary) hypertension: Secondary | ICD-10-CM | POA: Insufficient documentation

## 2013-10-31 DIAGNOSIS — K31819 Angiodysplasia of stomach and duodenum without bleeding: Secondary | ICD-10-CM

## 2013-10-31 HISTORY — PX: ESOPHAGOGASTRODUODENOSCOPY: SHX5428

## 2013-10-31 HISTORY — PX: HOT HEMOSTASIS: SHX5433

## 2013-10-31 SURGERY — EGD (ESOPHAGOGASTRODUODENOSCOPY)
Anesthesia: Moderate Sedation

## 2013-10-31 MED ORDER — BUTAMBEN-TETRACAINE-BENZOCAINE 2-2-14 % EX AERO
INHALATION_SPRAY | CUTANEOUS | Status: DC | PRN
  Administered 2013-10-31: 2 via TOPICAL

## 2013-10-31 MED ORDER — MIDAZOLAM HCL 5 MG/5ML IJ SOLN
INTRAMUSCULAR | Status: DC | PRN
  Administered 2013-10-31 (×2): 2 mg via INTRAVENOUS

## 2013-10-31 MED ORDER — STERILE WATER FOR IRRIGATION IR SOLN
Status: DC | PRN
  Administered 2013-10-31: 14:00:00

## 2013-10-31 MED ORDER — MEPERIDINE HCL 50 MG/ML IJ SOLN
INTRAMUSCULAR | Status: AC
  Filled 2013-10-31: qty 1

## 2013-10-31 MED ORDER — MIDAZOLAM HCL 5 MG/5ML IJ SOLN
INTRAMUSCULAR | Status: AC
  Filled 2013-10-31: qty 10

## 2013-10-31 MED ORDER — SODIUM CHLORIDE 0.9 % IV SOLN
INTRAVENOUS | Status: DC
  Administered 2013-10-31: 13:00:00 via INTRAVENOUS

## 2013-10-31 MED ORDER — MEPERIDINE HCL 50 MG/ML IJ SOLN
INTRAMUSCULAR | Status: DC | PRN
  Administered 2013-10-31 (×2): 25 mg via INTRAVENOUS

## 2013-10-31 NOTE — Op Note (Signed)
EGD PROCEDURE REPORT  PATIENT:  [name]  MR#:  [id number] Birthdate:  November 01, 1933, 79 y.o., female Endoscopist:  Dr. Malissa Hippo, MD Referred By:  Dr. Dwana Melena, MD  Procedure Date: 10/31/2013  Procedure:   EGD with argon plasma coagulation of GAVE.  Indications:  The patient is 79 years old Caucasian female with iron deficiency anemia secondary to chronic GI bleed from GAVE. She developed drop in her H&H and feels better with parenteral iron therapy. Her stool is guaiac-positive. She is undergoing therapeutic EGD. She has undergone 4 sessions of APC therapy before most recent of which was in September 2014.           Informed Consent:  The risks, benefits, alternatives & imponderables which include, but are not limited to, bleeding, infection, perforation, drug reaction and potential missed lesion have been reviewed.  The potential for biopsy, lesion removal, esophageal dilation, etc. have also been discussed.  Questions have been answered.  All parties agreeable.  Please see history & physical in medical record for more information.  Medications:  Demerol 50 mg IV Versed 4 mg IV Cetacaine spray topically for oropharyngeal anesthesia  Description of procedure:  The endoscope was introduced through the mouth and advanced to the second portion of the duodenum without difficulty or limitations. The mucosal surfaces were surveyed very carefully during advancement of the scope and upon withdrawal.  Findings:  Esophagus:  Mucosa of the esophagus was normal. GE junction was unremarkable. GEJ:  39 cm Stomach:  Stomach was empty and distended very well with insufflation. Folds in the proximal stomach are unremarkable. Few telangiectasia were noted the gastric body but these are extensive at antrum with nodularity to the mucosa and spontaneous bleeding from one site. Although the channel was patent. Angularis was unremarkable. Retroflexed view of fundus and cardia revealed few telangiectasia  close to GE junction. Duodenum:  Normal bulbar and post bulbar mucosa.  Therapeutic/Diagnostic Maneuvers Performed:   Most of telangiectasia over ablated with argon plasma coagulation. Lesions close to pylorus were not treated.  Complications:  None  Impression: Recurrent gastric antral vascular ectasia with evidence of active bleeding/oozing. Nodularity to antral mucosa previously biopsied and confirmed to be GAVE. Most of these lesions were treated with APC except close to the pylorus.  Recommendations:  Full liquids today. Sucralfate 1 g 4 times a day for 2 weeks. CBC next month as planned.  REHMAN,NAJEEB U  10/31/2013  2:05 PM  CC: Dr. Catalina Pizza, MD & Dr. Bonnetta Barry ref. provider found

## 2013-10-31 NOTE — H&P (Signed)
[name] is an 79 y.o. female.   Chief Complaint: Patient's here for EGD and APC of gastric antral vascular ectasia. HPI: Patient is 79 year old Caucasian female, time and was history of iron deficiency anemia with frequent need for parenteral iron. She has undergone multiple EGDs in the past and APC hoping to decrease the need for parenteral iron. She has not experienced melena or rectal bleeding since her last procedure. She denies abdominal pain. She is currently being treated for bronchitis and has been on Levaquin. She does not take NSAIDs.  Past Medical History  Diagnosis Date  . Hypertension   . Hypothyroidism   . Palpitations   . Anemia   . GAVE (gastric antral vascular ectasia) 09/12/12    Past Surgical History  Procedure Laterality Date  . Tonsillectomy and adenoidectomy    . US echocardiography  03/12/2006    EF 55-60%  . Knee replacement rt 3 yrs ago    . Back surger x 2    . Abdominal hysterectomy    . Shoulder surgery    . Colonoscopy  07/24/2012    Procedure: COLONOSCOPY;  Surgeon: Malissa Hippo, MD;  [facility];  Service: Endoscopy;  Laterality: N/A;  200  . Heel spur surgery    . Esophagogastroduodenoscopy  09/12/2012    Procedure: ESOPHAGOGASTRODUODENOSCOPY (EGD);  Surgeon: Malissa Hippo, MD;  [facility]: [facility];  Service: Endoscopy;  Laterality: N/A;  325-changed to 200 per Ann-pt moved up to 1030 Ann to notify pt  . Total abdominal hysterectomy    . Esophagogastroduodenoscopy  11/01/2012    Procedure: ESOPHAGOGASTRODUODENOSCOPY (EGD);  Surgeon: Malissa Hippo, MD;  [facility]: [facility];  Service: Endoscopy;  Laterality: N/A;  1:20  . Hot hemostasis  11/01/2012    Procedure: HOT HEMOSTASIS (ARGON PLASMA COAGULATION/BICAP);  Surgeon: Malissa Hippo, MD;  [facility]: [facility];  Service: Endoscopy;  Laterality: N/A;  . Esophagogastroduodenoscopy N/A 07/04/2013    Procedure: ESOPHAGOGASTRODUODENOSCOPY (EGD);  Surgeon: Malissa Hippo,  MD;  [facility];  Service: Endoscopy;  Laterality: N/A;  850  . Hot hemostasis N/A 07/04/2013    Procedure: HOT HEMOSTASIS (ARGON PLASMA COAGULATION/BICAP);  Surgeon: Malissa Hippo, MD;  [facility];  Service: Endoscopy;  Laterality: N/A;  . Esophagogastroduodenoscopy N/A 08/06/2013    Procedure: ESOPHAGOGASTRODUODENOSCOPY (EGD);  Surgeon: Malissa Hippo, MD;  [facility];  Service: Endoscopy;  Laterality: N/A;  1200  . Hot hemostasis N/A 08/06/2013    Procedure: HOT HEMOSTASIS (ARGON PLASMA COAGULATION/BICAP);  Surgeon: Malissa Hippo, MD;  [facility];  Service: Endoscopy;  Laterality: N/A;    Family History  Problem Relation Age of Onset  . Hypertension Mother   . Hypertension Father   . Heart attack Father    Social History:  reports that she has never smoked. She has never used smokeless tobacco. She reports that she does not drink alcohol or use illicit drugs.  Allergies:  Allergies  Allergen Reactions  . Avapro [Irbesartan] Other (See Comments)    Cough   . Clarithromycin Other (See Comments)    Unknown   . Losartan Potassium-Hctz Other (See Comments)    Unknown  . Maxzide [Hydrochlorothiazide W-Triamterene] Other (See Comments)    Unknown   . Ziac [Bisoprolol-Hydrochlorothiazide]     Thinks caused itching and cough 08/12/12  . Penicillins Rash  . Sulfa Drugs Cross Reactors Rash    Medications Prior to Admission  Medication Sig  Dispense Refill  . amLODipine (NORVASC) 2.5 MG tablet Take 1 tablet (2.5 mg total) by mouth daily.  90 tablet  3  . DIGOX 125 MCG tablet TAKE 1 TABLET BY MOUTH EVERY DAY AS DIRECTED* EXCEPT TAKE NONE ON WEDNESDAY AND SUNDAY  30 tablet  2  . hydrALAZINE (APRESOLINE) 10 MG tablet Take 1 tablet (10 mg total) by mouth 2 (two) times daily.  180 tablet  3  . levofloxacin (LEVAQUIN) 500 MG tablet Take 500 mg by mouth daily.      Marland Kitchen levothyroxine (SYNTHROID, LEVOTHROID) 112 MCG tablet Take 112 mcg by mouth  daily before breakfast.      . metoprolol tartrate (LOPRESSOR) 25 MG tablet TAKE 1 TABLET BY MOUTH DAILY  90 tablet  0  . pantoprazole (PROTONIX) 40 MG tablet Take 1 tablet (40 mg total) by mouth daily.  30 tablet  11  . polyethylene glycol powder (GLYCOLAX/MIRALAX) powder Take 17 g by mouth daily as needed for moderate constipation.         No results found for this or any previous visit (from the past 48 hour(s)). No results found.  ROS  Blood pressure 149/71, pulse 61, temperature 97.4 F (36.3 C), temperature source Oral, resp. rate 18, height 5\' 5"  (1.651 m), weight 160 lb (72.576 kg), SpO2 92.00%. Physical Exam  Constitutional: She appears well-developed and well-nourished.  HENT:  Mouth/Throat: Oropharynx is clear and moist.  Eyes: Conjunctivae are normal. No scleral icterus.  Neck: No thyromegaly present.  Cardiovascular: Normal rate, regular rhythm and normal heart sounds.   No murmur heard. Respiratory: Effort normal and breath sounds normal.  GI: Soft. She exhibits no distension and no mass. There is no tenderness.  Musculoskeletal: She exhibits no edema.  Lymphadenopathy:    She has no cervical adenopathy.  Neurological: She is alert.  Skin: Skin is warm and dry.     Assessment/Plan Iron deficiency anemia secondary to GI blood loss from GAVE, EGD with APC of GAVE,  [name] 10/31/2013, 1:36 PM

## 2013-11-04 ENCOUNTER — Encounter (HOSPITAL_COMMUNITY): Payer: Self-pay | Admitting: Internal Medicine

## 2013-11-18 ENCOUNTER — Encounter (HOSPITAL_COMMUNITY): Payer: Medicare Other | Attending: Oncology

## 2013-11-18 DIAGNOSIS — D509 Iron deficiency anemia, unspecified: Secondary | ICD-10-CM | POA: Diagnosis not present

## 2013-11-18 LAB — CBC WITH DIFFERENTIAL/PLATELET
BASOS ABS: 0.1 10*3/uL (ref 0.0–0.1)
Basophils Relative: 1 % (ref 0–1)
EOS PCT: 7 % — AB (ref 0–5)
Eosinophils Absolute: 0.4 10*3/uL (ref 0.0–0.7)
HEMATOCRIT: 42.8 % (ref 36.0–46.0)
Hemoglobin: 13.7 g/dL (ref 12.0–15.0)
Lymphocytes Relative: 28 % (ref 12–46)
Lymphs Abs: 1.4 10*3/uL (ref 0.7–4.0)
MCH: 30.7 pg (ref 26.0–34.0)
MCHC: 32 g/dL (ref 30.0–36.0)
MCV: 96 fL (ref 78.0–100.0)
MONO ABS: 0.8 10*3/uL (ref 0.1–1.0)
Monocytes Relative: 15 % — ABNORMAL HIGH (ref 3–12)
Neutro Abs: 2.5 10*3/uL (ref 1.7–7.7)
Neutrophils Relative %: 49 % (ref 43–77)
PLATELETS: 255 10*3/uL (ref 150–400)
RBC: 4.46 MIL/uL (ref 3.87–5.11)
RDW: 20.4 % — AB (ref 11.5–15.5)
WBC: 5.2 10*3/uL (ref 4.0–10.5)

## 2013-11-18 LAB — IRON AND TIBC
Iron: 56 ug/dL (ref 42–135)
Saturation Ratios: 17 % — ABNORMAL LOW (ref 20–55)
TIBC: 332 ug/dL (ref 250–470)
UIBC: 276 ug/dL (ref 125–400)

## 2013-11-18 LAB — FERRITIN: Ferritin: 29 ng/mL (ref 10–291)

## 2013-11-18 NOTE — Progress Notes (Signed)
Labs drawn today for cbc/diff,Iron and IBC,ferr 

## 2013-11-18 NOTE — Addendum Note (Signed)
Addended by: Mellissa Kohut on: 11/18/2013 10:43 AM   Modules accepted: Orders

## 2013-11-18 NOTE — Addendum Note (Signed)
Addended byLolita Lenz J on: 11/18/2013 11:07 AM   Modules accepted: Orders

## 2013-11-20 ENCOUNTER — Telehealth (INDEPENDENT_AMBULATORY_CARE_PROVIDER_SITE_OTHER): Payer: Self-pay | Admitting: *Deleted

## 2013-11-20 NOTE — Telephone Encounter (Signed)
FYI:  [name] wanted you to know her recent lab work is in [place], her hemoglobin was 13.7

## 2013-11-24 ENCOUNTER — Other Ambulatory Visit: Payer: Self-pay | Admitting: Cardiology

## 2013-11-24 NOTE — Telephone Encounter (Signed)
CBC and iron studies reviewed and discussed with patient. She will call if she has melena otherwise will have CBC in 6 weeks at [facility]

## 2013-11-25 ENCOUNTER — Ambulatory Visit (INDEPENDENT_AMBULATORY_CARE_PROVIDER_SITE_OTHER): Payer: Medicare Other | Admitting: Cardiology

## 2013-11-25 ENCOUNTER — Encounter: Payer: Self-pay | Admitting: Cardiology

## 2013-11-25 VITALS — BP 122/76 | HR 64 | Ht 61.0 in | Wt 161.0 lb

## 2013-11-25 DIAGNOSIS — K31819 Angiodysplasia of stomach and duodenum without bleeding: Secondary | ICD-10-CM

## 2013-11-25 DIAGNOSIS — R0609 Other forms of dyspnea: Secondary | ICD-10-CM | POA: Diagnosis not present

## 2013-11-25 DIAGNOSIS — I119 Hypertensive heart disease without heart failure: Secondary | ICD-10-CM | POA: Diagnosis not present

## 2013-11-25 DIAGNOSIS — R002 Palpitations: Secondary | ICD-10-CM

## 2013-11-25 DIAGNOSIS — R0989 Other specified symptoms and signs involving the circulatory and respiratory systems: Secondary | ICD-10-CM

## 2013-11-25 DIAGNOSIS — D509 Iron deficiency anemia, unspecified: Secondary | ICD-10-CM | POA: Diagnosis not present

## 2013-11-25 LAB — BASIC METABOLIC PANEL
BUN: 25 mg/dL — AB (ref 6–23)
CALCIUM: 9 mg/dL (ref 8.4–10.5)
CO2: 28 mEq/L (ref 19–32)
CREATININE: 1.1 mg/dL (ref 0.4–1.2)
Chloride: 105 mEq/L (ref 96–112)
GFR: 53.8 mL/min — AB (ref >60.00)
Glucose, Bld: 87 mg/dL (ref 70–99)
Potassium: 3.9 mEq/L (ref 3.5–5.1)
Sodium: 139 mEq/L (ref 135–145)

## 2013-11-25 LAB — HEPATIC FUNCTION PANEL
ALK PHOS: 81 U/L (ref 39–117)
ALT: 15 U/L (ref 0–35)
AST: 23 U/L (ref 0–37)
Albumin: 3.3 g/dL — ABNORMAL LOW (ref 3.5–5.2)
BILIRUBIN DIRECT: 0.1 mg/dL (ref 0.0–0.3)
TOTAL PROTEIN: 6.8 g/dL (ref 6.0–8.3)
Total Bilirubin: 0.6 mg/dL (ref 0.3–1.2)

## 2013-11-25 NOTE — Assessment & Plan Note (Signed)
She does not take oral iron.  She has not been aware of any recent hematochezia or melena .

## 2013-11-25 NOTE — Assessment & Plan Note (Signed)
The patient is doing well in terms of her palpitations.  She still has occasional isolated premature atrial beats.  These have improved

## 2013-11-25 NOTE — Assessment & Plan Note (Signed)
No headaches.  No dizziness or syncope.  No symptoms of CHF

## 2013-11-25 NOTE — Patient Instructions (Signed)
Will obtain labs today and call you with the results (BMET/HFP)  Your physician recommends that you continue on your current medications as directed. Please refer to the Current Medication list given to you today.  Your physician wants you to follow-up in: 4 months with fasting labs (lp/bmet/hfp) AND EKG You will receive a reminder letter in the mail two months in advance. If you don't receive a letter, please call our office to schedule the follow-up appointment.

## 2013-11-25 NOTE — Progress Notes (Signed)
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 She has a past history of angina hypertension and a history of frequent premature atrial beats. She had a recent nuclear stress test earlier this year on 12/21/11 because of exertional dyspnea. She was found to have no ischemia and her ejection fraction was 83% by Myoview. She subsequently was found to have severe iron deficiency. She was evaluated by Dr. Abran Duke who gave her IV iron infusion and she felt better almost immediately. She had been experiencing itching of her skin which resolved and she also had been craving ice which resolved after the iron infusion. The patient had an echocardiogram on 09/24/12 which showed normal left ventricular systolic function with an ejection fraction of 55-60%. She had moderate pulmonary hypertension with a pulmonary artery pressure of 54 and she has had a subsequent sleep study and is being followed by pulmonary. She is still using oxygen 2 L per minute at night but does not have to use it during the day when she is more active.  Recently the patient had recurrent GI bleed.  She has a diagnosis of GAVE.  Today she says she feels the best that she has felt in the past 2 years.  Her hemoglobin has improved following her last IV iron given in November 2014.   Current Outpatient Prescriptions  Medication Sig Dispense Refill  . amLODipine (NORVASC) 2.5 MG tablet Take 1 tablet (2.5 mg total) by mouth daily.  90 tablet  3  . DIGOX 125 MCG tablet TAKE 1 TABLET BY MOUTH EVERY DAY AS DIRECTED* EXCEPT TAKE NONE ON WEDNESDAY AND SUNDAY  30 tablet  2  . hydrALAZINE (APRESOLINE) 10 MG tablet Take 1 tablet (10 mg total) by mouth 2 (two) times daily.  180 tablet  3  . levothyroxine (SYNTHROID, LEVOTHROID) 112 MCG tablet Take 112 mcg by  mouth daily before breakfast.      . metoprolol tartrate (LOPRESSOR) 25 MG tablet TAKE 1 TABLET BY MOUTH DAILY  90 tablet  0  . pantoprazole (PROTONIX) 40 MG tablet Take 1 tablet (40 mg total) by mouth daily.  30 tablet  11  . polyethylene glycol powder (GLYCOLAX/MIRALAX) powder Take 17 g by mouth daily as needed for moderate constipation.        No current facility-administered medications for this visit.    Allergies  Allergen Reactions  . Avapro [Irbesartan] Other (See Comments)    Cough   . Clarithromycin Other (See Comments)    Unknown   . Losartan Potassium-Hctz Other (See Comments)    Unknown  . Maxzide [Hydrochlorothiazide W-Triamterene] Other (See Comments)    Unknown   . Ziac [Bisoprolol-Hydrochlorothiazide]     Thinks caused itching and cough 08/12/12  . Penicillins Rash  . Sulfa Drugs Cross Reactors Rash    Patient Active Problem List   Diagnosis Date Noted  . Constipation 07/23/2013  . Acute posthemorrhagic anemia 07/23/2013  . GAVE (gastric antral vascular ectasia) 07/23/2013  . Chronic respiratory failure 10/17/2012  . Iron deficiency anemia 10/15/2012  . Pulmonary nodule 09/26/2012  . Pulmonary hypertension 09/18/2012  . Pruritus 06/20/2012  . Benign hypertensive heart disease without heart failure 06/23/2011  . Palpitations 06/23/2011  . Hypothyroidism 06/23/2011  . GERD (gastroesophageal reflux disease) 06/23/2011  History  Smoking status  . Never Smoker   Smokeless tobacco  . Never Used    History  Alcohol Use No    Family History  Problem Relation Age of Onset  . Hypertension Mother   . Hypertension Father   . Heart attack Father     Review of Systems: Constitutional: no fever chills diaphoresis or fatigue or change in weight.  Head and neck: no hearing loss, no epistaxis, no photophobia or visual disturbance. Respiratory: No cough, shortness of breath or wheezing. Cardiovascular: No chest pain peripheral edema,  palpitations. Gastrointestinal: No abdominal distention, no abdominal pain, no change in bowel habits hematochezia or melena. Genitourinary: No dysuria, no frequency, no urgency, no nocturia. Musculoskeletal:No arthralgias, no back pain, no gait disturbance or myalgias. Neurological: No dizziness, no headaches, no numbness, no seizures, no syncope, no weakness, no tremors. Hematologic: No lymphadenopathy, no easy bruising. Psychiatric: No confusion, no hallucinations, no sleep disturbance.    Physical Exam: Filed Vitals:   11/25/13 1004  BP: 122/76  Pulse: 64   repeat blood pressure is 180/80 right arm sitting relaxed.The head and neck exam reveals pupils equal and reactive.  Extraocular movements are full.  There is no scleral icterus.  The mouth and pharynx are normal.  The neck is supple.  The carotids reveal no bruits.  The jugular venous pressure is normal.  The  thyroid is not enlarged.  There is no lymphadenopathy.  The chest is clear to percussion and auscultation.  There are no rales or rhonchi.  Expansion of the chest is symmetrical.  The precordium is quiet.  The first heart sound is normal.  The second heart sound is physiologically split.  There is no murmur gallop rub or click.  There is no abnormal lift or heave.  The abdomen is soft and nontender.  The bowel sounds are normal.  The liver and spleen are not enlarged.  There are no abdominal masses.  There are no abdominal bruits.  Extremities reveal good pedal pulses.  There is no phlebitis or edema.  There is no cyanosis or clubbing.  Strength is normal and symmetrical in all extremities.  There is no lateralizing weakness.  There are no sensory deficits.  The skin is warm and dry.  There is no rash.    Assessment / Plan: Continue same medication.  We are checking a basal metabolic panel and a hepatic function panel today.  She will return in 4 months for office visit EKG lipid panel hepatic function panel and basal metabolic  panel.  Her gastroenterologist and her hematologist are following her hemoglobin.

## 2013-11-26 ENCOUNTER — Telehealth: Payer: Self-pay | Admitting: *Deleted

## 2013-11-26 NOTE — Telephone Encounter (Signed)
Message copied by Earvin Hansen on Wed Nov 26, 2013  2:16 PM ------      Message from: Darlin Coco      Created: Wed Nov 26, 2013  7:41 AM       Please report to patient.  The recent labs are stable. Continue same medication and careful diet.  Kidneys are slightly dry.  Drink plenty of water. ------

## 2013-11-26 NOTE — Progress Notes (Signed)
Quick Note:  Please report to patient. The recent labs are stable. Continue same medication and careful diet. Kidneys are slightly dry. Drink plenty of water. ______

## 2013-11-26 NOTE — Telephone Encounter (Signed)
Advised patient of lab results  

## 2013-12-04 ENCOUNTER — Telehealth: Payer: Self-pay | Admitting: Internal Medicine

## 2013-12-04 NOTE — Telephone Encounter (Signed)
Dr Morrison Old office note states to repeat echo at next visit in 73mos which should be in 03/2014 pt is aware of this [name]

## 2013-12-04 NOTE — Telephone Encounter (Signed)
Looking in pt chart order is in [facility] from November 2014 for pt to have an echo.  Called and spoke with pt and she reports she was never called to get this set up. Please advise PCC's thanks

## 2013-12-10 DIAGNOSIS — Z85828 Personal history of other malignant neoplasm of skin: Secondary | ICD-10-CM | POA: Diagnosis not present

## 2013-12-10 DIAGNOSIS — L819 Disorder of pigmentation, unspecified: Secondary | ICD-10-CM | POA: Diagnosis not present

## 2013-12-10 DIAGNOSIS — D1801 Hemangioma of skin and subcutaneous tissue: Secondary | ICD-10-CM | POA: Diagnosis not present

## 2013-12-10 DIAGNOSIS — L738 Other specified follicular disorders: Secondary | ICD-10-CM | POA: Diagnosis not present

## 2013-12-10 DIAGNOSIS — L570 Actinic keratosis: Secondary | ICD-10-CM | POA: Diagnosis not present

## 2013-12-10 DIAGNOSIS — L821 Other seborrheic keratosis: Secondary | ICD-10-CM | POA: Diagnosis not present

## 2013-12-10 DIAGNOSIS — D239 Other benign neoplasm of skin, unspecified: Secondary | ICD-10-CM | POA: Diagnosis not present

## 2013-12-23 ENCOUNTER — Ambulatory Visit (HOSPITAL_COMMUNITY)
Admission: RE | Admit: 2013-12-23 | Discharge: 2013-12-23 | Disposition: A | Discharge status: Home / Self Care | Payer: Medicare Other | Source: Ambulatory Visit | Attending: Internal Medicine | Admitting: Internal Medicine

## 2013-12-23 ENCOUNTER — Other Ambulatory Visit (HOSPITAL_COMMUNITY): Payer: Self-pay | Admitting: Internal Medicine

## 2013-12-23 DIAGNOSIS — R05 Cough: Secondary | ICD-10-CM

## 2013-12-23 DIAGNOSIS — J449 Chronic obstructive pulmonary disease, unspecified: Secondary | ICD-10-CM | POA: Diagnosis not present

## 2013-12-23 DIAGNOSIS — J984 Other disorders of lung: Secondary | ICD-10-CM | POA: Diagnosis not present

## 2013-12-23 DIAGNOSIS — J209 Acute bronchitis, unspecified: Secondary | ICD-10-CM | POA: Diagnosis not present

## 2013-12-23 DIAGNOSIS — J4489 Other specified chronic obstructive pulmonary disease: Secondary | ICD-10-CM | POA: Insufficient documentation

## 2013-12-23 DIAGNOSIS — R062 Wheezing: Secondary | ICD-10-CM | POA: Diagnosis not present

## 2013-12-23 DIAGNOSIS — R059 Cough, unspecified: Secondary | ICD-10-CM | POA: Insufficient documentation

## 2013-12-24 ENCOUNTER — Encounter (HOSPITAL_COMMUNITY): Payer: Medicare Other | Attending: Oncology

## 2013-12-24 ENCOUNTER — Telehealth (HOSPITAL_COMMUNITY): Payer: Self-pay

## 2013-12-24 DIAGNOSIS — D509 Iron deficiency anemia, unspecified: Secondary | ICD-10-CM | POA: Diagnosis not present

## 2013-12-24 DIAGNOSIS — K31819 Angiodysplasia of stomach and duodenum without bleeding: Secondary | ICD-10-CM | POA: Diagnosis not present

## 2013-12-24 LAB — CBC WITH DIFFERENTIAL/PLATELET
Basophils Absolute: 0 10*3/uL (ref 0.0–0.1)
Basophils Relative: 1 % (ref 0–1)
EOS PCT: 1 % (ref 0–5)
Eosinophils Absolute: 0.1 10*3/uL (ref 0.0–0.7)
HCT: 37.2 % (ref 36.0–46.0)
Hemoglobin: 11.8 g/dL — ABNORMAL LOW (ref 12.0–15.0)
LYMPHS PCT: 20 % (ref 12–46)
Lymphs Abs: 0.9 10*3/uL (ref 0.7–4.0)
MCH: 29.8 pg (ref 26.0–34.0)
MCHC: 31.7 g/dL (ref 30.0–36.0)
MCV: 93.9 fL (ref 78.0–100.0)
Monocytes Absolute: 0.8 10*3/uL (ref 0.1–1.0)
Monocytes Relative: 19 % — ABNORMAL HIGH (ref 3–12)
NEUTROS ABS: 2.6 10*3/uL (ref 1.7–7.7)
Neutrophils Relative %: 59 % (ref 43–77)
PLATELETS: 215 10*3/uL (ref 150–400)
RBC: 3.96 MIL/uL (ref 3.87–5.11)
RDW: 16 % — AB (ref 11.5–15.5)
WBC: 4.4 10*3/uL (ref 4.0–10.5)

## 2013-12-24 LAB — IRON AND TIBC
IRON: 34 ug/dL — AB (ref 42–135)
Saturation Ratios: 10 % — ABNORMAL LOW (ref 20–55)
TIBC: 347 ug/dL (ref 250–470)
UIBC: 313 ug/dL (ref 125–400)

## 2013-12-24 NOTE — Progress Notes (Unsigned)
[name] presented for [employer]. Labs per MD order drawn via Peripheral Line 23 gauge needle inserted in right AC  Good blood return present. Procedure without incident.  Needle removed intact. Patient tolerated procedure well.

## 2013-12-24 NOTE — Telephone Encounter (Signed)
Call from patient with complaints of increased fatigue and shortness of breath.  Denies fevers, chills or any obvious signs of bleeding. Saw PCP and was treated for bronchitis but is still not feeling well.  Wants to know if she can come for blood work today instead of waiting until next week.  Discussed with PA and ok for patient to come in today.  Patient notified.

## 2013-12-25 ENCOUNTER — Other Ambulatory Visit (HOSPITAL_COMMUNITY): Payer: Self-pay | Admitting: Oncology

## 2013-12-25 ENCOUNTER — Encounter (HOSPITAL_BASED_OUTPATIENT_CLINIC_OR_DEPARTMENT_OTHER): Payer: Medicare Other

## 2013-12-25 VITALS — BP 127/54 | HR 55 | Temp 97.7°F | Resp 18

## 2013-12-25 DIAGNOSIS — D509 Iron deficiency anemia, unspecified: Secondary | ICD-10-CM

## 2013-12-25 DIAGNOSIS — K31819 Angiodysplasia of stomach and duodenum without bleeding: Secondary | ICD-10-CM

## 2013-12-25 LAB — COMPREHENSIVE METABOLIC PANEL
ALT: 15 U/L (ref 0–35)
AST: 22 U/L (ref 0–37)
Albumin: 2.7 g/dL — ABNORMAL LOW (ref 3.5–5.2)
Alkaline Phosphatase: 85 U/L (ref 39–117)
BUN: 20 mg/dL (ref 6–23)
CO2: 26 meq/L (ref 19–32)
Calcium: 8.8 mg/dL (ref 8.4–10.5)
Chloride: 101 mEq/L (ref 96–112)
Creatinine, Ser: 1.23 mg/dL — ABNORMAL HIGH (ref 0.50–1.10)
GFR calc Af Amer: 47 mL/min — ABNORMAL LOW (ref >90)
GFR, EST NON AFRICAN AMERICAN: 41 mL/min — AB (ref >90)
Glucose, Bld: 90 mg/dL (ref 70–99)
Potassium: 4.4 mEq/L (ref 3.7–5.3)
SODIUM: 139 meq/L (ref 137–147)
TOTAL PROTEIN: 6.7 g/dL (ref 6.0–8.3)
Total Bilirubin: 0.2 mg/dL — ABNORMAL LOW (ref 0.3–1.2)

## 2013-12-25 LAB — KAPPA/LAMBDA LIGHT CHAINS
KAPPA FREE LGHT CHN: 4.83 mg/dL — AB (ref 0.33–1.94)
Kappa, lambda light chain ratio: 0.97 (ref 0.26–1.65)
Lambda free light chains: 4.99 mg/dL — ABNORMAL HIGH (ref 0.57–2.63)

## 2013-12-25 LAB — FERRITIN: Ferritin: 34 ng/mL (ref 10–291)

## 2013-12-25 MED ORDER — SODIUM CHLORIDE 0.9 % IV SOLN
1020.0000 mg | Freq: Once | INTRAVENOUS | Status: AC
  Administered 2013-12-25: 1020 mg via INTRAVENOUS
  Filled 2013-12-25: qty 34

## 2013-12-25 MED ORDER — SODIUM CHLORIDE 0.9 % IV SOLN
1020.0000 mg | Freq: Once | INTRAVENOUS | Status: DC
  Filled 2013-12-25: qty 34

## 2013-12-25 MED ORDER — SODIUM CHLORIDE 0.9 % IV SOLN
Freq: Once | INTRAVENOUS | Status: AC
  Administered 2013-12-25: 15:00:00 via INTRAVENOUS

## 2013-12-25 NOTE — Progress Notes (Signed)
..  [name] arrived for iron infusion. Cmet drawn today due to c/o fatigue, diarrhea and just " not able to go".  Tolerated infusion well.  instructed to contact her PCP if not better in a few days.

## 2013-12-29 LAB — MULTIPLE MYELOMA PANEL, SERUM
Albumin ELP: 47 % — ABNORMAL LOW (ref 55.8–66.1)
Alpha-1-Globulin: 7.1 % — ABNORMAL HIGH (ref 2.9–4.9)
Alpha-2-Globulin: 14.5 % — ABNORMAL HIGH (ref 7.1–11.8)
Beta 2: 7.1 % — ABNORMAL HIGH (ref 3.2–6.5)
Beta Globulin: 8 % — ABNORMAL HIGH (ref 4.7–7.2)
Gamma Globulin: 16.3 % (ref 11.1–18.8)
IgA: 467 mg/dL — ABNORMAL HIGH (ref 69–380)
IgG (Immunoglobin G), Serum: 981 mg/dL (ref 690–1700)
IgM, Serum: 29 mg/dL — ABNORMAL LOW (ref 52–322)
M-Spike, %: NOT DETECTED g/dL
Total Protein: 6.4 g/dL (ref 6.0–8.3)

## 2013-12-30 ENCOUNTER — Other Ambulatory Visit (HOSPITAL_COMMUNITY): Payer: Medicare Other

## 2013-12-31 ENCOUNTER — Ambulatory Visit (HOSPITAL_COMMUNITY): Payer: Medicare Other | Admitting: Oncology

## 2014-01-05 NOTE — Progress Notes (Signed)
-  Rescheduled due to inclement weather-  [name]   

## 2014-01-06 ENCOUNTER — Ambulatory Visit (HOSPITAL_COMMUNITY): Payer: Medicare Other | Admitting: Oncology

## 2014-01-12 DIAGNOSIS — M47812 Spondylosis without myelopathy or radiculopathy, cervical region: Secondary | ICD-10-CM | POA: Diagnosis not present

## 2014-01-12 DIAGNOSIS — M47817 Spondylosis without myelopathy or radiculopathy, lumbosacral region: Secondary | ICD-10-CM | POA: Diagnosis not present

## 2014-01-19 ENCOUNTER — Other Ambulatory Visit (HOSPITAL_COMMUNITY): Payer: Self-pay | Admitting: Oncology

## 2014-01-19 ENCOUNTER — Encounter (HOSPITAL_COMMUNITY): Payer: Medicare Other | Attending: Oncology

## 2014-01-19 ENCOUNTER — Telehealth (HOSPITAL_COMMUNITY): Payer: Self-pay

## 2014-01-19 DIAGNOSIS — D509 Iron deficiency anemia, unspecified: Secondary | ICD-10-CM

## 2014-01-19 LAB — BASIC METABOLIC PANEL
BUN: 23 mg/dL (ref 6–23)
CO2: 27 mEq/L (ref 19–32)
Calcium: 9.7 mg/dL (ref 8.4–10.5)
Chloride: 98 mEq/L (ref 96–112)
Creatinine, Ser: 1.14 mg/dL — ABNORMAL HIGH (ref 0.50–1.10)
GFR, EST AFRICAN AMERICAN: 52 mL/min — AB (ref >90)
GFR, EST NON AFRICAN AMERICAN: 45 mL/min — AB (ref >90)
Glucose, Bld: 99 mg/dL (ref 70–99)
POTASSIUM: 4.4 meq/L (ref 3.7–5.3)
SODIUM: 137 meq/L (ref 137–147)

## 2014-01-19 LAB — CBC
HEMATOCRIT: 36.8 % (ref 36.0–46.0)
HEMOGLOBIN: 11.9 g/dL — AB (ref 12.0–15.0)
MCH: 31.2 pg (ref 26.0–34.0)
MCHC: 32.3 g/dL (ref 30.0–36.0)
MCV: 96.6 fL (ref 78.0–100.0)
Platelets: 370 10*3/uL (ref 150–400)
RBC: 3.81 MIL/uL — ABNORMAL LOW (ref 3.87–5.11)
RDW: 17.1 % — ABNORMAL HIGH (ref 11.5–15.5)
WBC: 10.2 10*3/uL (ref 4.0–10.5)

## 2014-01-19 LAB — FERRITIN: Ferritin: 380 ng/mL — ABNORMAL HIGH (ref 10–291)

## 2014-01-19 NOTE — Telephone Encounter (Signed)
pt called and stated she was not feeling good and wanted her Hbg checked. "i just feel bad". This message will be sent to [facility] pa and we will await his response. pt notified and in agreement.

## 2014-01-19 NOTE — Progress Notes (Signed)
Labs drawn today for cbc,ferr,bmp

## 2014-01-21 ENCOUNTER — Ambulatory Visit (HOSPITAL_COMMUNITY)
Admission: RE | Admit: 2014-01-21 | Discharge: 2014-01-21 | Disposition: A | Discharge status: Home / Self Care | Payer: Medicare Other | Source: Ambulatory Visit | Attending: Internal Medicine | Admitting: Internal Medicine

## 2014-01-21 ENCOUNTER — Other Ambulatory Visit (HOSPITAL_COMMUNITY): Payer: Self-pay | Admitting: Internal Medicine

## 2014-01-21 DIAGNOSIS — R918 Other nonspecific abnormal finding of lung field: Secondary | ICD-10-CM | POA: Diagnosis not present

## 2014-01-21 DIAGNOSIS — J449 Chronic obstructive pulmonary disease, unspecified: Secondary | ICD-10-CM | POA: Insufficient documentation

## 2014-01-21 DIAGNOSIS — E039 Hypothyroidism, unspecified: Secondary | ICD-10-CM | POA: Diagnosis not present

## 2014-01-21 DIAGNOSIS — J4489 Other specified chronic obstructive pulmonary disease: Secondary | ICD-10-CM | POA: Insufficient documentation

## 2014-01-21 DIAGNOSIS — M5030 Other cervical disc degeneration, unspecified cervical region: Secondary | ICD-10-CM | POA: Diagnosis not present

## 2014-01-21 DIAGNOSIS — R5383 Other fatigue: Secondary | ICD-10-CM | POA: Diagnosis not present

## 2014-01-21 DIAGNOSIS — D509 Iron deficiency anemia, unspecified: Secondary | ICD-10-CM | POA: Diagnosis not present

## 2014-01-21 DIAGNOSIS — R5381 Other malaise: Secondary | ICD-10-CM | POA: Diagnosis not present

## 2014-01-21 DIAGNOSIS — R05 Cough: Secondary | ICD-10-CM | POA: Insufficient documentation

## 2014-01-21 DIAGNOSIS — R059 Cough, unspecified: Secondary | ICD-10-CM | POA: Insufficient documentation

## 2014-01-21 DIAGNOSIS — J984 Other disorders of lung: Secondary | ICD-10-CM | POA: Diagnosis not present

## 2014-01-21 DIAGNOSIS — R0602 Shortness of breath: Secondary | ICD-10-CM | POA: Diagnosis not present

## 2014-01-21 DIAGNOSIS — M47812 Spondylosis without myelopathy or radiculopathy, cervical region: Secondary | ICD-10-CM | POA: Diagnosis not present

## 2014-01-22 NOTE — Progress Notes (Signed)
Mary Cahill, MD  [place] [address]  Iron deficiency anemia  GAVE (gastric antral vascular ectasia)  Pulmonary hypertension  CURRENT THERAPY: Intermittent IV Feraheme last given on 12/25/2013.   INTERVAL HISTORY: [name] 80 y.o. female returns for  regular  visit for followup of severe iron deficiency anemia secondary to GAVE Syndrome AND Immunoglobulin lab abnormalities without monoclonal spike.  I personally reviewed and went over laboratory results with the patient.  The results are noted within this dictation.  Patient education provided regarding iron deficiency anemia: Iron deficiency anemia is the most common anemia.  Beside playing a critical role as an oxygen carrier in the heme group of hemoglobin, iron is found in many key proteins in the cells, such as cytochromes and myoglobin, so it is not unexpected that a lack of iron has effects other than anemia.  Three studies have focused on nonanemic iron deficiency leading to fatigue.  Two studies showed that oral iron supplementation reduces fatigue, with no significant change in hemoglobin levels, in women with a ferritin level of less than 50 ng/mL, and a third study showed a lessening of fatigue with parental iron administration in women with a ferritin level of 15 ng/mL or less or an iron saturation of 20% or less.   Owing to obligate iron loss through menses, women are at greater risk for iron deficiency than men.  Iron loss in all women averages 1-3 ng per day, and dietary intake is often inadequate to maintain a positive iron balance.  A 1967 study showed that 25% of healthy, college-age women had no bone marrow iron stores and that another 33% had low stores.  Pregnancy adds to demands for iron, with requirements increasing to 6 ng per day by the end of pregnancy.  Athletes are another group at risk for iron deficiency.  Gastrointestinal tract blood is the source of iron loss, and exercise-induced  hemolysis leads to urinary iron losses.  Decreased absorption of iron has also been implicated as a cause of iron deficiency, because of levels of hepcidin are often elevated in athletes owing to training-induced inflammation.    Obesity and its surgical treatment are also at risk factors for iron deficiency.  Obese patients are often iron-deficient, with increased hepcidin level being implicated in decreased absorption.  After bariatric surgery, the incidence of iron deficiency can be as high as 50%.  Because the main site of iron absorption is the duodenum, surgeries that involve bypassing this part of the bowel are associated with an increased incidence of iron deficiency.  However, iron deficiency is seen as a sequela of most types of bariatric surgery.    -NEJM Volume 371, No 14, pg [id number]  She underwent an EGD by Dr. Laural Golden on 10/31/2013.  This examination revealed a recurrent GAVE with active bleeding.    She has read some information on the Internet from the [facility] in [place], [place] regarding other treatment modalities related to [place] Syndrome.  She did not know the details, but she plans on discussing these with Dr. Laural Golden in the future.   I have reviewed her Feraheme requirements over the past 12 months:   Oncology Flowsheet 03/28/2013 06/19/2013 09/30/2013 12/25/2013  ferumoxytol [facility]) IV 1,020 mg 1,020 mg 1,020 mg 1,020 mg   She has required IV Feraheme every 3-4 months.  It is time to develop a supportive therapy plan to meet her iron requirement after 12 months of data collection.  Here is the supportive therapy plan: 1. Labs prior to IV Feraheme: CBC, iron/TIBC, ferritin every 12 weeks 2. IV Feraheme 1020 mg the day of labs every 12 weeks 3. We will adjust dosing schedule based off of labs.  She is agreeable to this plan.  She was educated regarding ferritin and the idea that this is an acute phase reactant and can be falsely elevated, but is rarely falsely low.   Therefore, it is useful when ferritin results are low, thereby providing a high suspicion for iron deficiency.   She feels much improved compared to last week.  She was seen by Dr. Nevada Crane and started on PO steroids.    Hematologically, she denies any complaints and ROS questioning is negative.   Past Medical History  Diagnosis Date  . Hypertension   . Hypothyroidism   . Palpitations   . Anemia   . GAVE (gastric antral vascular ectasia) 09/12/12    has Benign hypertensive heart disease without heart failure; Palpitations; Hypothyroidism; GERD (gastroesophageal reflux disease); Pruritus; Pulmonary hypertension; Pulmonary nodule; Iron deficiency anemia; Chronic respiratory failure; Constipation; Acute posthemorrhagic anemia; and GAVE (gastric antral vascular ectasia) on her problem list.     is allergic to avapro; clarithromycin; losartan potassium-hctz; maxzide; ziac; penicillins; and sulfa drugs cross reactors.  [name] does not currently have medications on file.  Past Surgical History  Procedure Laterality Date  . Tonsillectomy and adenoidectomy    . US echocardiography  03/12/2006    EF 55-60%  . Knee replacement rt 3 yrs ago    . Back surger x 2    . Abdominal hysterectomy    . Shoulder surgery    . Colonoscopy  07/24/2012    Procedure: COLONOSCOPY;  Surgeon: Rogene Houston, MD;  [facility];  Service: Endoscopy;  Laterality: N/A;  200  . Heel spur surgery    . Esophagogastroduodenoscopy  09/12/2012    Procedure: ESOPHAGOGASTRODUODENOSCOPY (EGD);  Surgeon: Rogene Houston, MD;  [facility];  Service: Endoscopy;  Laterality: N/A;  325-changed to 200 per Ann-pt moved up to [place] to notify pt  . Total abdominal hysterectomy    . Esophagogastroduodenoscopy  11/01/2012    Procedure: ESOPHAGOGASTRODUODENOSCOPY (EGD);  Surgeon: Rogene Houston, MD;  [facility];  Service: Endoscopy;  Laterality: N/A;  1:20  . Hot hemostasis  11/01/2012     Procedure: HOT HEMOSTASIS (ARGON PLASMA COAGULATION/BICAP);  Surgeon: Rogene Houston, MD;  [facility];  Service: Endoscopy;  Laterality: N/A;  . Esophagogastroduodenoscopy N/A 07/04/2013    Procedure: ESOPHAGOGASTRODUODENOSCOPY (EGD);  Surgeon: Rogene Houston, MD;  [facility];  Service: Endoscopy;  Laterality: N/A;  850  . Hot hemostasis N/A 07/04/2013    Procedure: HOT HEMOSTASIS (ARGON PLASMA COAGULATION/BICAP);  Surgeon: Rogene Houston, MD;  [facility];  Service: Endoscopy;  Laterality: N/A;  . Esophagogastroduodenoscopy N/A 08/06/2013    Procedure: ESOPHAGOGASTRODUODENOSCOPY (EGD);  Surgeon: Rogene Houston, MD;  [facility];  Service: Endoscopy;  Laterality: N/A;  1200  . Hot hemostasis N/A 08/06/2013    Procedure: HOT HEMOSTASIS (ARGON PLASMA COAGULATION/BICAP);  Surgeon: Rogene Houston, MD;  [facility];  Service: Endoscopy;  Laterality: N/A;  . Esophagogastroduodenoscopy N/A 10/31/2013    Procedure: ESOPHAGOGASTRODUODENOSCOPY (EGD);  Surgeon: Rogene Houston, MD;  [facility];  Service: Endoscopy;  Laterality: N/A;  125-moved to [place] notified pt  . Hot hemostasis N/A 10/31/2013  Procedure: HOT HEMOSTASIS (ARGON PLASMA COAGULATION/BICAP);  Surgeon: Rogene Houston, MD;  [facility];  Service: Endoscopy;  Laterality: N/A;    Denies any headaches, dizziness, double vision, fevers, chills, night sweats, nausea, vomiting, diarrhea, constipation, chest pain, heart palpitations, shortness of breath, blood in stool, black tarry stool, urinary pain, urinary burning, urinary frequency, hematuria.   PHYSICAL EXAMINATION  ECOG PERFORMANCE STATUS: 1 - Symptomatic but completely ambulatory  Filed Vitals:   01/23/14 1000  BP: 98/59  Pulse: 66  Temp: 97.3 F (36.3 C)  Resp: 20    GENERAL:alert, no distress, well nourished, well developed, comfortable, cooperative and smiling SKIN: skin color, texture,  turgor are normal, no rashes or significant lesions HEAD: Normocephalic, No masses, lesions, tenderness or abnormalities EYES: normal, PERRLA, EOMI, Conjunctiva are pink and non-injected EARS: External ears normal OROPHARYNX:mucous membranes are moist  NECK: supple, trachea midline LYMPH:  not examined BREAST:not examined LUNGS: clear to auscultation  HEART: regular rate & rhythm, no murmurs and no gallops ABDOMEN:abdomen soft, non-tender and normal bowel sounds BACK: Back symmetric, no curvature. EXTREMITIES:less then 2 second capillary refill, no skin discoloration, no cyanosis  NEURO: alert & oriented x 3 with fluent speech, no focal motor/sensory deficits, gait normal   LABORATORY DATA: CBC    Component Value Date/Time   WBC 10.2 01/19/2014 1228   RBC 3.81* 01/19/2014 1228   RBC 4.12 08/20/2012 1725   HGB 11.9* 01/19/2014 1228   HCT 36.8 01/19/2014 1228   PLT 370 01/19/2014 1228   MCV 96.6 01/19/2014 1228   MCH 31.2 01/19/2014 1228   MCHC 32.3 01/19/2014 1228   RDW 17.1* 01/19/2014 1228   LYMPHSABS 0.9 12/24/2013 1450   MONOABS 0.8 12/24/2013 1450   EOSABS 0.1 12/24/2013 1450   BASOSABS 0.0 12/24/2013 1450      Chemistry      Component Value Date/Time   NA 137 01/19/2014 1228   K 4.4 01/19/2014 1228   CL 98 01/19/2014 1228   CO2 27 01/19/2014 1228   BUN 23 01/19/2014 1228   CREATININE 1.14* 01/19/2014 1228      Component Value Date/Time   CALCIUM 9.7 01/19/2014 1228   ALKPHOS 85 12/25/2013 1425   AST 22 12/25/2013 1425   ALT 15 12/25/2013 1425   BILITOT 0.2* 12/25/2013 1425     Results for [name], [name] (MRN [id number]) as of 01/05/2014 16:46  Ref. Range 12/24/2013 14:50  Albumin ELP Latest Range: 55.8-66.1 % 47.0 (L)  Alpha-1-Globulin Latest Range: 2.9-4.9 % 7.1 (H)  Alpha-2-Globulin Latest Range: 7.1-11.8 % 14.5 (H)  Beta Globulin Latest Range: 4.7-7.2 % 8.0 (H)  Beta 2 Latest Range: 3.2-6.5 % 7.1 (H)  Gamma Globulin Latest Range: 11.1-18.8 % 16.3  M-SPIKE, % No range found NOT DETECTED  SPE  Interp. No range found (NOTE)  Comment No range found (NOTE)  IgG (Immunoglobin G), Serum Latest Range: [id number] mg/dL 981  IgA Latest Range: 69-380 mg/dL 467 (H)  IgM, Serum Latest Range: 52-322 mg/dL 29 (L)  Kappa free light chain Latest Range: 0.33-1.94 mg/dL 4.83 (H)  Lamda free light chains Latest Range: 0.57-2.63 mg/dL 4.99 (H)  Kappa, lamda light chain ratio Latest Range: 0.26-1.65  0.97       ASSESSMENT:  1. Severe iron deficiency anemia, S/P Feraheme IV 1020 mg on 09/30/2013. Ferrous Sulfate 325 mg failure with intolerance to this medication due to constipation and abdominal discomfort and without good absorption of this medication and will therefore discontinue.  Now on every  3 months IV Feraheme supportive therapy plan developed on 01/23/2014. 2. GAVE Syndrome, cause of #1. Last EGD performed by Dr. Laural Golden on 08/06/2013. Followed by GI.  3. Mild hypogammaglobulinemia in the IgM class. No monoclonal spike is noted.  4. Mild IgA elevation, again without indication of monoclonal spike.  5. Pulmonary nodule, followed by pulmonology  Patient Active Problem List   Diagnosis Date Noted  . Constipation 07/23/2013  . Acute posthemorrhagic anemia 07/23/2013  . GAVE (gastric antral vascular ectasia) 07/23/2013  . Chronic respiratory failure 10/17/2012  . Iron deficiency anemia 10/15/2012  . Pulmonary nodule 09/26/2012  . Pulmonary hypertension 09/18/2012  . Pruritus 06/20/2012  . Benign hypertensive heart disease without heart failure 06/23/2011  . Palpitations 06/23/2011  . Hypothyroidism 06/23/2011  . GERD (gastroesophageal reflux disease) 06/23/2011     PLAN:  1. I personally reviewed and went over laboratory results with the patient.  The results are noted within this dictation. 2. I personally reviewed and went over radiographic studies with the patient.  The results are noted within this dictation.   3. I have reviewed the patient's IV Feraheme requirment over the past 12  months.  4. Development of IV Ferheme supportive therapy plan:  A. IV Feraheme 1020 mg every 3 months  B. CBC, Iron/TIBC, Ferritin every 3 months (day of IV Feraheme prior to infusion)  C. Will adjust frequency based on Ferritin level 5. Follow-up with Dr. Laural Golden as scheduled 6. Labs in 6 months: SPEP with IFE, serum light chain assay (added into supportive therapy plan) 7. Return in 6 months for follow-up.    THERAPY PLAN:  We will continue to monitor her labs and support her counts with IV Feraheme per supportive therapy plan:  A. IV Feraheme 1020 mg every 3 months  B. CBC, Iron/TIBC, Ferritin every 3 months (day of IV Feraheme prior to infusion)  C. Will adjust frequency based on Ferritin level  All questions were answered. The patient knows to call the clinic with any problems, questions or concerns. We can certainly see the patient much sooner if necessary.  Patient and plan discussed with Dr. Farrel Gobble and he is in agreement with the aforementioned.   KEFALAS,THOMAS

## 2014-01-23 ENCOUNTER — Encounter (HOSPITAL_COMMUNITY): Payer: Self-pay | Admitting: Oncology

## 2014-01-23 ENCOUNTER — Encounter (HOSPITAL_BASED_OUTPATIENT_CLINIC_OR_DEPARTMENT_OTHER): Payer: Medicare Other | Admitting: Oncology

## 2014-01-23 VITALS — BP 98/59 | HR 66 | Temp 97.3°F | Resp 20 | Wt 154.1 lb

## 2014-01-23 DIAGNOSIS — D508 Other iron deficiency anemias: Secondary | ICD-10-CM

## 2014-01-23 DIAGNOSIS — K31819 Angiodysplasia of stomach and duodenum without bleeding: Secondary | ICD-10-CM

## 2014-01-23 DIAGNOSIS — I272 Pulmonary hypertension, unspecified: Secondary | ICD-10-CM

## 2014-01-23 DIAGNOSIS — K31811 Angiodysplasia of stomach and duodenum with bleeding: Secondary | ICD-10-CM | POA: Diagnosis not present

## 2014-01-23 DIAGNOSIS — D509 Iron deficiency anemia, unspecified: Secondary | ICD-10-CM

## 2014-01-23 NOTE — Patient Instructions (Signed)
[facility] Discharge Instructions  RECOMMENDATIONS MADE BY THE CONSULTANT AND ANY TEST RESULTS WILL BE SENT TO YOUR REFERRING PHYSICIAN.  EXAM FINDINGS BY THE PHYSICIAN TODAY AND SIGNS OR SYMPTOMS TO REPORT TO CLINIC OR PRIMARY PHYSICIAN: Exam and findings as discussed by Robynn Pane, PA-C.  Will give feraheme every 3 months.  Report increased fatigue or shortness of breath.  MEDICATIONS PRESCRIBED:  none  INSTRUCTIONS/FOLLOW-UP: Follow-up in 6 months  Thank you for choosing [facility] to provide your oncology and hematology care.  To afford each patient quality time with our providers, please arrive at least 15 minutes before your scheduled appointment time.  With your help, our goal is to use those 15 minutes to complete the necessary work-up to ensure our physicians have the information they need to help with your evaluation and healthcare recommendations.    Effective January 1st, 2014, we ask that you re-schedule your appointment with our physicians should you arrive 10 or more minutes late for your appointment.  We strive to give you quality time with our providers, and arriving late affects you and other patients whose appointments are after yours.    Again, thank you for choosing [facility].  Our hope is that these requests will decrease the amount of time that you wait before being seen by our physicians.       _____________________________________________________________  Should you have questions after your visit to [facility], please contact our office at [phone] between the hours of 8:30 a.m. and 5:00 p.m.  Voicemails left after 4:30 p.m. will not be returned until the following business day.  For prescription refill requests, have your pharmacy contact our office with your prescription refill request.

## 2014-01-24 ENCOUNTER — Other Ambulatory Visit: Payer: Self-pay | Admitting: Cardiology

## 2014-01-29 ENCOUNTER — Ambulatory Visit (HOSPITAL_COMMUNITY)
Admission: RE | Admit: 2014-01-29 | Discharge: 2014-01-29 | Disposition: A | Discharge status: Home / Self Care | Payer: Medicare Other | Source: Ambulatory Visit | Attending: Internal Medicine | Admitting: Internal Medicine

## 2014-01-29 DIAGNOSIS — I059 Rheumatic mitral valve disease, unspecified: Secondary | ICD-10-CM | POA: Insufficient documentation

## 2014-01-29 DIAGNOSIS — I2789 Other specified pulmonary heart diseases: Secondary | ICD-10-CM | POA: Insufficient documentation

## 2014-01-29 DIAGNOSIS — I359 Nonrheumatic aortic valve disorder, unspecified: Secondary | ICD-10-CM

## 2014-01-29 DIAGNOSIS — R0602 Shortness of breath: Secondary | ICD-10-CM | POA: Insufficient documentation

## 2014-01-29 DIAGNOSIS — I519 Heart disease, unspecified: Secondary | ICD-10-CM | POA: Diagnosis not present

## 2014-01-29 DIAGNOSIS — I079 Rheumatic tricuspid valve disease, unspecified: Secondary | ICD-10-CM | POA: Insufficient documentation

## 2014-01-29 DIAGNOSIS — I272 Pulmonary hypertension, unspecified: Secondary | ICD-10-CM

## 2014-01-29 DIAGNOSIS — I77819 Aortic ectasia, unspecified site: Secondary | ICD-10-CM | POA: Diagnosis not present

## 2014-01-29 NOTE — Progress Notes (Signed)
*  PRELIMINARY RESULTS* Echocardiogram 2D Echocardiogram has been performed.  [place], [place] 01/29/2014, 11:33 AM

## 2014-01-30 ENCOUNTER — Encounter: Payer: Self-pay | Admitting: Internal Medicine

## 2014-01-30 NOTE — Progress Notes (Signed)
Quick Note:  ATC, NA and no option to leave msg, WCB ______ 

## 2014-02-02 NOTE — Progress Notes (Signed)
Quick Note:  ATC, line busy, WCB ______ 

## 2014-02-09 NOTE — Progress Notes (Signed)
Quick Note:  Spoke with pt and notified of results per Dr. Wert. Pt verbalized understanding and denied any questions.  ______ 

## 2014-02-11 DIAGNOSIS — I499 Cardiac arrhythmia, unspecified: Secondary | ICD-10-CM | POA: Diagnosis not present

## 2014-02-11 DIAGNOSIS — E039 Hypothyroidism, unspecified: Secondary | ICD-10-CM | POA: Diagnosis not present

## 2014-02-12 DIAGNOSIS — G545 Neuralgic amyotrophy: Secondary | ICD-10-CM | POA: Diagnosis not present

## 2014-02-12 DIAGNOSIS — M47812 Spondylosis without myelopathy or radiculopathy, cervical region: Secondary | ICD-10-CM | POA: Diagnosis not present

## 2014-02-12 DIAGNOSIS — M47817 Spondylosis without myelopathy or radiculopathy, lumbosacral region: Secondary | ICD-10-CM | POA: Diagnosis not present

## 2014-02-18 ENCOUNTER — Other Ambulatory Visit: Payer: Self-pay

## 2014-02-18 DIAGNOSIS — M5137 Other intervertebral disc degeneration, lumbosacral region: Secondary | ICD-10-CM | POA: Diagnosis not present

## 2014-02-18 DIAGNOSIS — Z981 Arthrodesis status: Secondary | ICD-10-CM | POA: Diagnosis not present

## 2014-02-18 DIAGNOSIS — M47817 Spondylosis without myelopathy or radiculopathy, lumbosacral region: Secondary | ICD-10-CM | POA: Diagnosis not present

## 2014-02-18 DIAGNOSIS — Z1231 Encounter for screening mammogram for malignant neoplasm of breast: Secondary | ICD-10-CM

## 2014-02-18 DIAGNOSIS — M48061 Spinal stenosis, lumbar region without neurogenic claudication: Secondary | ICD-10-CM | POA: Diagnosis not present

## 2014-02-20 ENCOUNTER — Ambulatory Visit (INDEPENDENT_AMBULATORY_CARE_PROVIDER_SITE_OTHER): Payer: Medicare Other | Admitting: Internal Medicine

## 2014-02-20 ENCOUNTER — Encounter: Payer: Self-pay | Admitting: Internal Medicine

## 2014-02-20 VITALS — BP 122/66 | HR 70 | Temp 97.8°F | Ht 61.0 in | Wt 159.0 lb

## 2014-02-20 DIAGNOSIS — I272 Pulmonary hypertension, unspecified: Secondary | ICD-10-CM

## 2014-02-20 DIAGNOSIS — I2789 Other specified pulmonary heart diseases: Secondary | ICD-10-CM | POA: Diagnosis not present

## 2014-02-20 DIAGNOSIS — R911 Solitary pulmonary nodule: Secondary | ICD-10-CM

## 2014-02-20 DIAGNOSIS — J9610 Chronic respiratory failure, unspecified whether with hypoxia or hypercapnia: Secondary | ICD-10-CM

## 2014-02-20 NOTE — Patient Instructions (Addendum)
No change in treatment plan  Please schedule a follow up visit in 6 months but call sooner if needed

## 2014-02-20 NOTE — Progress Notes (Signed)
Subjective:    Patient ID: [name], female    DOB: 1933/09/07  MRN: [id number]    Brief patient profile:  71 yowf never smoker with new onset sob April 2013 referred 09/18/2012 by Dr Tressie Stalker for sob assoc with anemia with evidence of mod PAH but neg w/u except for noct desat including v/q    History of Present Illness  09/18/2012 1st pulmonary ov/ [name] cc indolent onset sob April 2013 gradually worse through summer and into fall to point just across the room but no problems at rest or lying down, much better since dx with fe def anemia and rx with IV iron. rec Schedule echocardiogram > mod PAH We will place your record in a recall file for one year notification ideally you need a  repeat CT scan to complete the evaluation  Late add : ono RA ordered > pos desat    10/17/2012 f/u ov/[name] cc back to baseline in terms of activity tolerance with no limiting sob- attributes this to iron rx. Denies am ha or hypersomnolence rec  titrate up 02  Sleep study > neg for osa   01/02/2013 f/u ov/[name] cc breathing  much better since starting 02 rec No change rx   04/02/2013 f/u ov/[name] re PAH adjusted to 1lpm and feels fine on this Chief Complaint  Patient presents with  . Follow-up    Breathing has improved and is doing great. Reports no new acute complaints. Denies chest pain, chest tightness, SOB, coughing or wheezing.  not limited by breathing but 4lpm def caused ha so wasn't taking it that way anyway and doing better on 1lpm. rec 2lpm at hs, no change otherwise   09/29/2013 f/u ov/[name] re: unexplained hypoxemia Chief Complaint  Patient presents with  . Followup with PFT    Pt states breathing is doing well and denies any co's today  Only 02 is at bedtime 2lpm  Not limited by breathing at all at ths point  rec Continue 02 2lpm at bedtime Please schedule a follow up visit in 6  months but call sooner if needed  Late add will need repeat echo next ov and cxr    02/21/2014 f/u  ov/[name] re:  PAH/ unexplained hypoxemia  Chief Complaint  Patient presents with  . Follow-up    Pt has no breathing complaints at this time.  Is just getting over the flu from Feb.    Not limited by breathing from desired activities   Got "run down" by flu but no respiratory complaints at all  Still using 2lpm at hs only     No obvious daytime variabilty or assoc chronic cough or cp or chest tightness, subjective wheeze overt sinus or hb symptoms. No unusual exp hx or h/o childhood pna/ asthma or premature birth to her knowledge.   Sleeping ok without nocturnal  or early am exacerbation  of respiratory  c/o's or need for noct saba. Also denies any obvious fluctuation of symptoms with weather or environmental changes or other aggravating or alleviating factors except as outlined above   Current Medications, Allergies, Past Medical History, Past Surgical History, Family History, and Social History were reviewed in [employer] record.  ROS  The following are not active complaints unless bolded sore throat, dysphagia, dental problems, itching, sneezing,  nasal congestion or excess/ purulent secretions, ear ache,   fever, chills, sweats, unintended wt loss, pleuritic or exertional cp, hemoptysis,  orthopnea pnd or leg swelling, presyncope, palpitations, heartburn, abdominal pain,  anorexia, nausea, vomiting, diarrhea  or change in bowel or urinary habits, change in stools or urine, dysuria,hematuria,  rash, arthralgias, visual complaints, headache, numbness weakness or ataxia or problems with walking or coordination,  change in mood/affect or memory.             Objective:   Physical Exam    Wt 159 10/17/2012  > 01/05/2013  166 > 04/02/2013  162> 161 09/29/2013 >  159  Wt Readings from Last 3 Encounters:  09/23/12 160 lb (72.576 kg)  09/18/12 160 lb 3.2 oz (72.666 kg)  09/12/12 159 lb (72.122 kg)    amb wf nad  HEENT: nl dentition, turbinates, and orophanx. Nl external  ear canals without cough reflex   NECK :  without JVD/Nodes/TM/ nl carotid upstrokes bilaterally   LUNGS: no acc muscle use, clear to A and P bilaterally without cough on insp or exp maneuvers   CV:  RRR  no s3 or murmur or increase in P2, trace edema   ABD:  soft and nontender with nl excursion in the supine position. No bruits or organomegaly, bowel sounds nl  MS:  warm without deformities, calf tenderness, cyanosis or clubbing         08/20/12 CT  1. Scattered pulmonary nodules with a spiculated 6 x 8 mm nodule in  the left lower lobe. If the patient is at high risk for  bronchogenic carcinoma, follow-up chest CT at 3-6 months is  recommended. If the patient is at low risk for bronchogenic  carcinoma, follow-up chest CT at 6-12 months is recommended.  2. Scattered pleural parenchymal scarring with mosaic attenuation.  The latter finding can be seen with small airways disease.  3. Pulmonary arterial hypertension  CT chest 01/21/14 no contrast  COPD changes with peripheral scarring in the upper lobes and mosaic  attenuation/air trapping likely reflecting small airway disease.  Stable spiculated nodular density left lower lobe 8 x 6 mm unchanged  since 08/26/2012.     Assessment & Plan:

## 2014-02-21 ENCOUNTER — Encounter: Payer: Self-pay | Admitting: Internal Medicine

## 2014-02-21 NOTE — Assessment & Plan Note (Signed)
-   Echo 09/24/12 >Left ventricle: The cavity size was normal. Wall thickness was normal. Systolic function was normal. The estimated ejection fraction was in the range of 55% to 60%. Wall motion was normal; there were no regional wall motion abnormalities. - Aortic valve: Mild regurgitation. - Right atrium: The atrium was mildly dilated. - Pulmonary arteries: Systolic pressure was moderately increased. PA peak pressure: 73mm Hg  Vs 47 01/28/14 - ONO  RA rec 10/01/2012  >  sats <= 88%  For 8h 40 min > rec 02 2lpm and repeat study 10/24/12 still destat < 88 for 4: 18m so sleep study ordered. - Sleep study 11/19/12 neg osa but desat > see chronic resp failure - V/Q 04/11/13> low prob - PFT's 09/29/13 FEV1  1.06 (61%) ratio 77 and no change p B2,  DLC0 72 corrects to 102   Improving on present rx which is just noct 02 at the best flow she can tolerate

## 2014-02-21 NOTE — Assessment & Plan Note (Signed)
CT chest 08/20/12 - Scattered pulmonary nodules with a spiculated 6 x 8 mm nodule in  the left lower lobe  Scattered pleural parenchymal scarring with mosaic attenuation.  The latter finding can be seen with small airways disease.  Repeat CT chest 01/21/14 > Stable spiculated nodular density left lower lobe 8 x 6 mm unchanged  since 08/26/2012> do not rec additional CT's

## 2014-02-21 NOTE — Assessment & Plan Note (Signed)
-   Rx = 2lpm started 10/17/2012  Based on ONO  RA rec 10/01/2012  >  sats <= 88%  For 8h 40 min  > 3 h 38 min < 88 @ 3lpm 12/03/12 > rec sleep study > neg osa - 10/17/2012  Walked RA x 3 laps @ 185 ft each stopped due to end of study, sat 90%, no sob - 12/13/12 ONO 4lpm >  1 h 22m 44 sec @ sat < 88 - 04/02/2013  Walked RA x 2laps @ 185 ft each stopped due to  desat 86 - 04/21/13 ONO 2lpm 146.79m at < 88% but cannot tolerate higher settings - 09/29/13 Walked RA  2 laps @ 185 ft each stopped due to desat to 88%  - 02/20/2014  Walked RA x 3 laps @ 185 ft each stopped due to end of study, sats still 90%  Since doing better on even sub therapeutic but tolerable 02 flow rec no change rx

## 2014-02-22 ENCOUNTER — Other Ambulatory Visit: Payer: Self-pay | Admitting: Cardiology

## 2014-03-06 ENCOUNTER — Other Ambulatory Visit: Payer: Self-pay | Admitting: Cardiology

## 2014-03-10 DIAGNOSIS — M47817 Spondylosis without myelopathy or radiculopathy, lumbosacral region: Secondary | ICD-10-CM | POA: Diagnosis not present

## 2014-03-10 DIAGNOSIS — G545 Neuralgic amyotrophy: Secondary | ICD-10-CM | POA: Diagnosis not present

## 2014-03-10 DIAGNOSIS — M47812 Spondylosis without myelopathy or radiculopathy, cervical region: Secondary | ICD-10-CM | POA: Diagnosis not present

## 2014-03-19 ENCOUNTER — Encounter (HOSPITAL_COMMUNITY): Payer: Medicare Other | Attending: Oncology

## 2014-03-19 DIAGNOSIS — D800 Hereditary hypogammaglobulinemia: Secondary | ICD-10-CM | POA: Diagnosis not present

## 2014-03-19 DIAGNOSIS — K31819 Angiodysplasia of stomach and duodenum without bleeding: Secondary | ICD-10-CM | POA: Diagnosis not present

## 2014-03-19 DIAGNOSIS — D509 Iron deficiency anemia, unspecified: Secondary | ICD-10-CM | POA: Diagnosis not present

## 2014-03-19 MED ORDER — SODIUM CHLORIDE 0.9 % IJ SOLN: 10.0000 mL | INTRAMUSCULAR | Status: DC | PRN

## 2014-03-19 MED ORDER — SODIUM CHLORIDE 0.9 % IV SOLN: Freq: Once | INTRAVENOUS | Status: DC

## 2014-03-19 MED ORDER — SODIUM CHLORIDE 0.9 % IV SOLN
1020.0000 mg | Freq: Once | INTRAVENOUS | Status: DC
  Filled 2014-03-19: qty 34

## 2014-03-20 ENCOUNTER — Encounter (HOSPITAL_BASED_OUTPATIENT_CLINIC_OR_DEPARTMENT_OTHER): Payer: Medicare Other

## 2014-03-20 DIAGNOSIS — D509 Iron deficiency anemia, unspecified: Secondary | ICD-10-CM

## 2014-03-20 DIAGNOSIS — K31819 Angiodysplasia of stomach and duodenum without bleeding: Secondary | ICD-10-CM

## 2014-03-20 LAB — FERRITIN: FERRITIN: 16 ng/mL (ref 10–291)

## 2014-03-20 MED ORDER — SODIUM CHLORIDE 0.9 % IV SOLN
Freq: Once | INTRAVENOUS | Status: AC
  Administered 2014-03-20: 12:00:00 via INTRAVENOUS

## 2014-03-20 MED ORDER — SODIUM CHLORIDE 0.9 % IJ SOLN
10.0000 mL | INTRAMUSCULAR | Status: DC | PRN
  Administered 2014-03-20: 10 mL

## 2014-03-20 MED ORDER — SODIUM CHLORIDE 0.9 % IV SOLN
1020.0000 mg | Freq: Once | INTRAVENOUS | Status: AC
  Administered 2014-03-20: 1020 mg via INTRAVENOUS

## 2014-03-20 NOTE — Progress Notes (Signed)
Tolerated well

## 2014-03-20 NOTE — Progress Notes (Signed)
03/19/14 1330:  [name] presented for labwork. Labs per MD order drawn via Peripheral Line 23 gauge needle inserted in right antecubital.  Good blood return present. Procedure without incident.  Needle removed intact. Patient tolerated procedure well.

## 2014-03-27 ENCOUNTER — Ambulatory Visit
Admission: RE | Admit: 2014-03-27 | Discharge: 2014-03-27 | Disposition: A | Discharge status: Home / Self Care | Payer: Medicare Other | Source: Ambulatory Visit

## 2014-03-27 DIAGNOSIS — Z1231 Encounter for screening mammogram for malignant neoplasm of breast: Secondary | ICD-10-CM | POA: Diagnosis not present

## 2014-03-31 DIAGNOSIS — M47817 Spondylosis without myelopathy or radiculopathy, lumbosacral region: Secondary | ICD-10-CM | POA: Diagnosis not present

## 2014-03-31 DIAGNOSIS — M5380 Other specified dorsopathies, site unspecified: Secondary | ICD-10-CM | POA: Diagnosis not present

## 2014-04-03 ENCOUNTER — Other Ambulatory Visit: Payer: Self-pay | Admitting: Cardiology

## 2014-04-07 DIAGNOSIS — Z79899 Other long term (current) drug therapy: Secondary | ICD-10-CM | POA: Diagnosis not present

## 2014-04-07 DIAGNOSIS — I10 Essential (primary) hypertension: Secondary | ICD-10-CM | POA: Diagnosis not present

## 2014-04-07 DIAGNOSIS — E039 Hypothyroidism, unspecified: Secondary | ICD-10-CM | POA: Diagnosis not present

## 2014-04-07 DIAGNOSIS — Z882 Allergy status to sulfonamides status: Secondary | ICD-10-CM | POA: Diagnosis not present

## 2014-04-07 DIAGNOSIS — Z981 Arthrodesis status: Secondary | ICD-10-CM | POA: Diagnosis not present

## 2014-04-07 DIAGNOSIS — Z880 Allergy status to penicillin: Secondary | ICD-10-CM | POA: Diagnosis not present

## 2014-04-07 DIAGNOSIS — F17200 Nicotine dependence, unspecified, uncomplicated: Secondary | ICD-10-CM | POA: Diagnosis not present

## 2014-04-07 DIAGNOSIS — Z96659 Presence of unspecified artificial knee joint: Secondary | ICD-10-CM | POA: Diagnosis not present

## 2014-04-07 DIAGNOSIS — M47817 Spondylosis without myelopathy or radiculopathy, lumbosacral region: Secondary | ICD-10-CM | POA: Diagnosis not present

## 2014-04-20 ENCOUNTER — Encounter: Payer: Self-pay | Admitting: Cardiology

## 2014-04-20 ENCOUNTER — Other Ambulatory Visit: Payer: Medicare Other

## 2014-04-20 ENCOUNTER — Ambulatory Visit (INDEPENDENT_AMBULATORY_CARE_PROVIDER_SITE_OTHER): Payer: Medicare Other | Admitting: Cardiology

## 2014-04-20 VITALS — BP 156/79 | HR 64 | Ht 61.0 in | Wt 159.0 lb

## 2014-04-20 DIAGNOSIS — I119 Hypertensive heart disease without heart failure: Secondary | ICD-10-CM | POA: Diagnosis not present

## 2014-04-20 DIAGNOSIS — R002 Palpitations: Secondary | ICD-10-CM | POA: Diagnosis not present

## 2014-04-20 DIAGNOSIS — I272 Pulmonary hypertension, unspecified: Secondary | ICD-10-CM

## 2014-04-20 DIAGNOSIS — R5383 Other fatigue: Secondary | ICD-10-CM | POA: Diagnosis not present

## 2014-04-20 DIAGNOSIS — D500 Iron deficiency anemia secondary to blood loss (chronic): Secondary | ICD-10-CM

## 2014-04-20 DIAGNOSIS — R5381 Other malaise: Secondary | ICD-10-CM

## 2014-04-20 DIAGNOSIS — D509 Iron deficiency anemia, unspecified: Secondary | ICD-10-CM

## 2014-04-20 DIAGNOSIS — I2789 Other specified pulmonary heart diseases: Secondary | ICD-10-CM

## 2014-04-20 HISTORY — DX: Iron deficiency anemia secondary to blood loss (chronic): D50.0

## 2014-04-20 LAB — CBC WITH DIFFERENTIAL/PLATELET
BASOS PCT: 1 % (ref 0.0–3.0)
Basophils Absolute: 0.1 10*3/uL (ref 0.0–0.1)
Eosinophils Absolute: 0.3 10*3/uL (ref 0.0–0.7)
Eosinophils Relative: 3.2 % (ref 0.0–5.0)
HEMATOCRIT: 40 % (ref 36.0–46.0)
HEMOGLOBIN: 13 g/dL (ref 12.0–15.0)
LYMPHS ABS: 0.9 10*3/uL (ref 0.7–4.0)
Lymphocytes Relative: 10.8 % — ABNORMAL LOW (ref 12.0–46.0)
MCHC: 32.5 g/dL (ref 30.0–36.0)
MCV: 95.2 fl (ref 78.0–100.0)
MONOS PCT: 11.7 % (ref 3.0–12.0)
Monocytes Absolute: 0.9 10*3/uL (ref 0.1–1.0)
NEUTROS ABS: 5.8 10*3/uL (ref 1.4–7.7)
Neutrophils Relative %: 73.3 % (ref 43.0–77.0)
PLATELETS: 236 10*3/uL (ref 150.0–400.0)
RBC: 4.2 Mil/uL (ref 3.87–5.11)
RDW: 21.3 % — ABNORMAL HIGH (ref 11.5–15.5)
WBC: 7.9 10*3/uL (ref 4.0–10.5)

## 2014-04-20 LAB — BASIC METABOLIC PANEL
BUN: 19 mg/dL (ref 6–23)
CHLORIDE: 107 meq/L (ref 96–112)
CO2: 24 mEq/L (ref 19–32)
Calcium: 9 mg/dL (ref 8.4–10.5)
Creatinine, Ser: 0.9 mg/dL (ref 0.4–1.2)
GFR: 61.82 mL/min (ref >60.00)
Glucose, Bld: 89 mg/dL (ref 70–99)
Potassium: 4.2 mEq/L (ref 3.5–5.1)
SODIUM: 138 meq/L (ref 135–145)

## 2014-04-20 LAB — LIPID PANEL
CHOL/HDL RATIO: 4
Cholesterol: 153 mg/dL (ref 0–200)
HDL: 43.6 mg/dL (ref >39.00)
LDL Cholesterol: 89 mg/dL (ref 0–99)
NonHDL: 109.4
Triglycerides: 103 mg/dL (ref 0.0–149.0)
VLDL: 20.6 mg/dL (ref 0.0–40.0)

## 2014-04-20 LAB — HEPATIC FUNCTION PANEL
ALT: 15 U/L (ref 0–35)
AST: 20 U/L (ref 0–37)
Albumin: 3.2 g/dL — ABNORMAL LOW (ref 3.5–5.2)
Alkaline Phosphatase: 82 U/L (ref 39–117)
BILIRUBIN DIRECT: 0.1 mg/dL (ref 0.0–0.3)
BILIRUBIN TOTAL: 0.4 mg/dL (ref 0.2–1.2)
Total Protein: 5.9 g/dL — ABNORMAL LOW (ref 6.0–8.3)

## 2014-04-20 LAB — IBC PANEL
Iron: 105 ug/dL (ref 42–145)
SATURATION RATIOS: 34 % (ref 20.0–50.0)
Transferrin: 220.3 mg/dL (ref 212.0–360.0)

## 2014-04-20 LAB — FERRITIN: Ferritin: 70.1 ng/mL (ref 10.0–291.0)

## 2014-04-20 NOTE — Assessment & Plan Note (Signed)
Blood pressure has been remaining stable.  She has not been experiencing any chest pain or increased dyspnea.  No dizziness or syncope.

## 2014-04-20 NOTE — Progress Notes (Signed)
Quick Note:  Please report to patient. The recent labs are stable. Continue same medication and careful diet.Hgb is normal now at 13.0. Iron stores are normal. ______

## 2014-04-20 NOTE — Assessment & Plan Note (Signed)
She has a history of chronic GI blood loss secondary to GAVE.  She has not been aware of any visible blood in her stools.  CBC is pending

## 2014-04-20 NOTE — Assessment & Plan Note (Signed)
Because of her pulmonary hypertension she uses nasal oxygen 2 L a minute at night

## 2014-04-20 NOTE — Patient Instructions (Addendum)
Will obtain labs today and call you with the results (LP/BMET/HFP/CBC/FERRITIN/TIBC)  Your physician recommends that you continue on your current medications as directed. Please refer to the Current Medication list given to you today  Your physician wants you to follow-up in: 4 months with fasting labs (lp/bmet/hfp)  You will receive a reminder letter in the mail two months in advance. If you don't receive a letter, please call our office to schedule the follow-up appointment.

## 2014-04-20 NOTE — Progress Notes (Signed)
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 She has a past history of angina hypertension and a history of frequent premature atrial beats. She had a  nuclear stress test  on 12/21/11 because of exertional dyspnea. She was found to have no ischemia and her ejection fraction was 83% by Myoview. She subsequently was found to have severe iron deficiency. She was evaluated by Dr. Abran Duke who gave her IV iron infusion and she felt better almost immediately. She had been experiencing itching of her skin which resolved and she also had been craving ice which resolved after the iron infusion. The patient had an echocardiogram on 09/24/12 which showed normal left ventricular systolic function with an ejection fraction of 55-60%. She had moderate pulmonary hypertension with a pulmonary artery pressure of 54 and she has had a subsequent sleep study and is being followed by pulmonary. She is still using oxygen 2 L per minute at night but does not have to use it during the day when she is more active. Recently the patient had recurrent GI bleed. She has a diagnosis of GAVE.  She is followed by hematology.  At her request we are checking CBC ferritin serum iron and TIBC today.   Current Outpatient Prescriptions  Medication Sig Dispense Refill  . albuterol (PROVENTIL) (2.5 MG/3ML) 0.083% nebulizer solution Take 2.5 mg by nebulization every 6 (six) hours as needed for wheezing or shortness of breath.      Marland Kitchen amLODipine (NORVASC) 2.5 MG tablet Take 1 tablet (2.5 mg total) by mouth daily.  90 tablet  3  . DIGOX 125 MCG tablet TAKE 1 TABLET BY MOUTH EVERY DAY AS DIRECTED- TAKE NO TABLET ON WEDNESDAY OR SUNDAY  30 tablet  3  . hydrALAZINE (APRESOLINE) 10 MG tablet Take 1 tablet (10 mg total) by mouth 2  (two) times daily.  180 tablet  3  . levothyroxine (SYNTHROID, LEVOTHROID) 112 MCG tablet Take 112 mcg by mouth daily before breakfast.      . metoprolol tartrate (LOPRESSOR) 25 MG tablet TAKE 1 TABLET BY MOUTH EVERY DAY  90 tablet  1  . pantoprazole (PROTONIX) 40 MG tablet TAKE 1 TABLET BY MOUTH EVERY DAY  90 tablet  0  . polyethylene glycol powder (GLYCOLAX/MIRALAX) powder Take 17 g by mouth daily as needed for moderate constipation.        No current facility-administered medications for this visit.    Allergies  Allergen Reactions  . Avapro [Irbesartan] Other (See Comments)    Cough   . Clarithromycin Other (See Comments)    Unknown   . Losartan Potassium-Hctz Other (See Comments)    Unknown  . Maxzide [Hydrochlorothiazide W-Triamterene] Other (See Comments)    Unknown   . Ziac [Bisoprolol-Hydrochlorothiazide]     Thinks caused itching and cough 08/12/12  . Penicillins Rash  . Sulfa Drugs Cross Reactors Rash    Patient Active Problem List   Diagnosis Date Noted  . Iron deficiency anemia due to chronic blood loss 04/20/2014  . Constipation 07/23/2013  . Acute posthemorrhagic anemia 07/23/2013  . GAVE (gastric antral vascular ectasia) 07/23/2013  . Chronic respiratory failure 10/17/2012  . Iron deficiency anemia 10/15/2012  . Pulmonary nodule 09/26/2012  . Pulmonary hypertension 09/18/2012  .  Pruritus 06/20/2012  . Benign hypertensive heart disease without heart failure 06/23/2011  . Palpitations 06/23/2011  . Hypothyroidism 06/23/2011  . GERD (gastroesophageal reflux disease) 06/23/2011    History  Smoking status  . Never Smoker   Smokeless tobacco  . Never Used    History  Alcohol Use No    Family History  Problem Relation Age of Onset  . Hypertension Mother   . Hypertension Father   . Heart attack Father     Review of Systems: Constitutional: no fever chills diaphoresis or fatigue or change in weight.  Head and neck: no hearing loss, no epistaxis, no  photophobia or visual disturbance. Respiratory: No cough, shortness of breath or wheezing. Cardiovascular: No chest pain peripheral edema, palpitations. Gastrointestinal: No abdominal distention, no abdominal pain, no change in bowel habits hematochezia or melena. Genitourinary: No dysuria, no frequency, no urgency, no nocturia. Musculoskeletal:No arthralgias, no back pain, no gait disturbance or myalgias. Neurological: No dizziness, no headaches, no numbness, no seizures, no syncope, no weakness, no tremors. Hematologic: No lymphadenopathy, no easy bruising. Psychiatric: No confusion, no hallucinations, no sleep disturbance.    Physical Exam: Filed Vitals:   04/20/14 0929  BP: 156/79  Pulse: 64   the general appearance reveals a well-developed well-nourished woman in no distress.The head and neck exam reveals pupils equal and reactive.  Extraocular movements are full.  There is no scleral icterus.  The mouth and pharynx are normal.  The neck is supple.  The carotids reveal no bruits.  The jugular venous pressure is normal.  The  thyroid is not enlarged.  There is no lymphadenopathy.  The chest is clear to percussion and auscultation.  There are no rales or rhonchi.  Expansion of the chest is symmetrical.  The precordium is quiet.  Occasional premature beats are noted on auscultation. The first heart sound is normal.  The second heart sound is physiologically split.  There is no murmur gallop rub or click.  There is no abnormal lift or heave.  The abdomen is soft and nontender.  The bowel sounds are normal.  The liver and spleen are not enlarged.  There are no abdominal masses.  There are no abdominal bruits.  Extremities reveal good pedal pulses.  There is no phlebitis or edema.  There is no cyanosis or clubbing.  Strength is normal and symmetrical in all extremities.  There is no lateralizing weakness.  There are no sensory deficits.  The skin is warm and dry.  There is no rash.  EKG shows  normal sinus rhythm and no ischemic changes.   Assessment / Plan: 1. benign hypertensive heart disease without heart failure. 2. GAVE (gastric antral vascular ectasia) 3. chronic iron deficiency anemia secondary to gastric antral vascular ectasia. 4. pulmonary hypertension 5. stable dyspnea on exertion, multifactorial  Plan: Continue on same medication.  Lab work pending.  Recheck in 4 months for office visit lipid panel hepatic function panel and basal metabolic panel

## 2014-04-21 ENCOUNTER — Other Ambulatory Visit (HOSPITAL_COMMUNITY): Payer: Medicare Other

## 2014-04-22 ENCOUNTER — Encounter: Payer: Self-pay | Admitting: Cardiology

## 2014-04-23 ENCOUNTER — Other Ambulatory Visit: Payer: Self-pay | Admitting: *Deleted

## 2014-04-23 DIAGNOSIS — I119 Hypertensive heart disease without heart failure: Secondary | ICD-10-CM

## 2014-05-07 DIAGNOSIS — M4310 Spondylolisthesis, site unspecified: Secondary | ICD-10-CM | POA: Diagnosis not present

## 2014-05-07 DIAGNOSIS — M47817 Spondylosis without myelopathy or radiculopathy, lumbosacral region: Secondary | ICD-10-CM | POA: Diagnosis not present

## 2014-05-11 DIAGNOSIS — E039 Hypothyroidism, unspecified: Secondary | ICD-10-CM | POA: Diagnosis not present

## 2014-05-11 DIAGNOSIS — D509 Iron deficiency anemia, unspecified: Secondary | ICD-10-CM | POA: Diagnosis not present

## 2014-05-13 DIAGNOSIS — M2559 Pain in other specified joint: Secondary | ICD-10-CM | POA: Diagnosis not present

## 2014-05-13 DIAGNOSIS — M47817 Spondylosis without myelopathy or radiculopathy, lumbosacral region: Secondary | ICD-10-CM | POA: Diagnosis not present

## 2014-05-18 ENCOUNTER — Other Ambulatory Visit: Payer: Self-pay | Admitting: Cardiology

## 2014-05-19 ENCOUNTER — Ambulatory Visit (HOSPITAL_COMMUNITY): Payer: Medicare Other | Admitting: Oncology

## 2014-05-19 NOTE — Telephone Encounter (Signed)
pantoprazole (PROTONIX) 40 MG tablet  TAKE 1 TABLET BY MOUTH EVERY DAY   90 tablet   0   Patient Instructions      Will obtain labs today and call you with the results (LP/BMET/HFP/CBC/FERRITIN/TIBC)   Your physician recommends that you continue on your current medications as directed. Please refer to the Current Medication list given to you today   Your physician wants you to follow-up in: 4 months with fasting labs (lp/bmet/hfp)  You will receive a reminder letter in the mail two months in advance. If you don't receive a letter, please call our office to schedule the follow-up appointment.     04/03/2014 5:16 PM Darlin Coco, MD [place] [phone]

## 2014-06-11 ENCOUNTER — Ambulatory Visit (HOSPITAL_COMMUNITY): Payer: Medicare Other

## 2014-06-15 ENCOUNTER — Other Ambulatory Visit (HOSPITAL_COMMUNITY): Payer: Self-pay | Admitting: Oncology

## 2014-06-15 ENCOUNTER — Telehealth (HOSPITAL_COMMUNITY): Payer: Self-pay | Admitting: Oncology

## 2014-06-15 DIAGNOSIS — D509 Iron deficiency anemia, unspecified: Secondary | ICD-10-CM

## 2014-06-15 NOTE — Telephone Encounter (Signed)
Hgb was 13 g/dL on 6/8 when it was checked last and reported in [facility].  She is welcome to come in for labs and we can check her iron level.  If iron levels indicate Feraheme infusion, would be happy to order for her. Orders placed for labs.

## 2014-06-15 NOTE — Telephone Encounter (Signed)
[name] contacted and advised as per Caroleen Hamman, PA-C's note.  Coming in 06/16/14 for labs and will receive iron infusion if indicated by labs.  [name] verbalized understanding of follow-up.

## 2014-06-15 NOTE — Telephone Encounter (Signed)
[name] called reporting mild fatigue; states her hgb was checked this weekend and was 11.5.  She wants to know if she can come in for an iron infusion stating she felt much better when her hgb was up.  She is also concerned that she is having surgery on 06/29/14 for lumbar fusion and would like to have an iron infusion before then, if feasible.  Please send your response back to the oncology nurses's pool.

## 2014-06-16 ENCOUNTER — Encounter (HOSPITAL_COMMUNITY): Payer: Medicare Other | Attending: Oncology

## 2014-06-16 ENCOUNTER — Other Ambulatory Visit (HOSPITAL_COMMUNITY): Payer: Self-pay | Admitting: Oncology

## 2014-06-16 DIAGNOSIS — K31819 Angiodysplasia of stomach and duodenum without bleeding: Secondary | ICD-10-CM | POA: Diagnosis not present

## 2014-06-16 DIAGNOSIS — D508 Other iron deficiency anemias: Secondary | ICD-10-CM

## 2014-06-16 DIAGNOSIS — D509 Iron deficiency anemia, unspecified: Secondary | ICD-10-CM | POA: Insufficient documentation

## 2014-06-16 LAB — CBC WITH DIFFERENTIAL/PLATELET
BASOS PCT: 1 % (ref 0–1)
Basophils Absolute: 0.1 10*3/uL (ref 0.0–0.1)
EOS PCT: 7 % — AB (ref 0–5)
Eosinophils Absolute: 0.3 10*3/uL (ref 0.0–0.7)
HEMATOCRIT: 37.4 % (ref 36.0–46.0)
HEMOGLOBIN: 12.1 g/dL (ref 12.0–15.0)
Lymphocytes Relative: 17 % (ref 12–46)
Lymphs Abs: 0.9 10*3/uL (ref 0.7–4.0)
MCH: 30.3 pg (ref 26.0–34.0)
MCHC: 32.4 g/dL (ref 30.0–36.0)
MCV: 93.5 fL (ref 78.0–100.0)
MONO ABS: 0.4 10*3/uL (ref 0.1–1.0)
Monocytes Relative: 8 % (ref 3–12)
Neutro Abs: 3.5 10*3/uL (ref 1.7–7.7)
Neutrophils Relative %: 67 % (ref 43–77)
Platelets: 307 10*3/uL (ref 150–400)
RBC: 4 MIL/uL (ref 3.87–5.11)
RDW: 15.6 % — AB (ref 11.5–15.5)
WBC: 5.3 10*3/uL (ref 4.0–10.5)

## 2014-06-16 LAB — IRON AND TIBC
Iron: 33 ug/dL — ABNORMAL LOW (ref 42–135)
Saturation Ratios: 9 % — ABNORMAL LOW (ref 20–55)
TIBC: 382 ug/dL (ref 250–470)
UIBC: 349 ug/dL (ref 125–400)

## 2014-06-16 LAB — FERRITIN: Ferritin: 18 ng/mL (ref 10–291)

## 2014-06-16 NOTE — Progress Notes (Signed)
LABS DRAWN FOR FERR,IRON/TIBC,CBCD

## 2014-06-17 ENCOUNTER — Encounter (HOSPITAL_BASED_OUTPATIENT_CLINIC_OR_DEPARTMENT_OTHER): Payer: Medicare Other

## 2014-06-17 VITALS — BP 141/54 | HR 58 | Temp 98.1°F | Resp 18 | Wt 157.8 lb

## 2014-06-17 DIAGNOSIS — D508 Other iron deficiency anemias: Secondary | ICD-10-CM | POA: Diagnosis not present

## 2014-06-17 DIAGNOSIS — D509 Iron deficiency anemia, unspecified: Secondary | ICD-10-CM

## 2014-06-17 MED ORDER — SODIUM CHLORIDE 0.9 % IV SOLN: 510.0000 mg | Freq: Once | INTRAVENOUS | Status: DC

## 2014-06-17 MED ORDER — SODIUM CHLORIDE 0.9 % IV SOLN
INTRAVENOUS | Status: DC
  Administered 2014-06-17: 10:00:00 via INTRAVENOUS

## 2014-06-17 MED ORDER — SODIUM CHLORIDE 0.9 % IV SOLN
510.0000 mg | Freq: Once | INTRAVENOUS | Status: AC
  Administered 2014-06-17: 510 mg via INTRAVENOUS
  Filled 2014-06-17: qty 17

## 2014-06-17 NOTE — Progress Notes (Signed)
Patient tolerated infusion without difficulty

## 2014-06-18 ENCOUNTER — Ambulatory Visit (HOSPITAL_COMMUNITY): Payer: Medicare Other | Admitting: Oncology

## 2014-06-23 DIAGNOSIS — Z79899 Other long term (current) drug therapy: Secondary | ICD-10-CM | POA: Diagnosis not present

## 2014-06-23 DIAGNOSIS — M47817 Spondylosis without myelopathy or radiculopathy, lumbosacral region: Secondary | ICD-10-CM | POA: Diagnosis not present

## 2014-06-23 DIAGNOSIS — E039 Hypothyroidism, unspecified: Secondary | ICD-10-CM | POA: Diagnosis not present

## 2014-06-23 DIAGNOSIS — I10 Essential (primary) hypertension: Secondary | ICD-10-CM | POA: Diagnosis not present

## 2014-06-23 DIAGNOSIS — Q762 Congenital spondylolisthesis: Secondary | ICD-10-CM | POA: Diagnosis not present

## 2014-06-23 DIAGNOSIS — Z981 Arthrodesis status: Secondary | ICD-10-CM | POA: Diagnosis not present

## 2014-06-23 DIAGNOSIS — D509 Iron deficiency anemia, unspecified: Secondary | ICD-10-CM | POA: Diagnosis not present

## 2014-06-23 DIAGNOSIS — Z888 Allergy status to other drugs, medicaments and biological substances status: Secondary | ICD-10-CM | POA: Diagnosis not present

## 2014-06-23 DIAGNOSIS — M539 Dorsopathy, unspecified: Secondary | ICD-10-CM | POA: Diagnosis not present

## 2014-06-23 DIAGNOSIS — Z880 Allergy status to penicillin: Secondary | ICD-10-CM | POA: Diagnosis not present

## 2014-06-23 DIAGNOSIS — Z882 Allergy status to sulfonamides status: Secondary | ICD-10-CM | POA: Diagnosis not present

## 2014-06-24 ENCOUNTER — Encounter (HOSPITAL_BASED_OUTPATIENT_CLINIC_OR_DEPARTMENT_OTHER): Payer: Medicare Other

## 2014-06-24 VITALS — BP 139/57 | HR 65 | Temp 97.8°F | Resp 18

## 2014-06-24 DIAGNOSIS — K31819 Angiodysplasia of stomach and duodenum without bleeding: Secondary | ICD-10-CM

## 2014-06-24 DIAGNOSIS — D509 Iron deficiency anemia, unspecified: Secondary | ICD-10-CM

## 2014-06-24 MED ORDER — SODIUM CHLORIDE 0.9 % IV SOLN
510.0000 mg | Freq: Once | INTRAVENOUS | Status: AC
  Administered 2014-06-24: 510 mg via INTRAVENOUS
  Filled 2014-06-24: qty 17

## 2014-06-24 MED ORDER — SODIUM CHLORIDE 0.9 % IV SOLN
Freq: Once | INTRAVENOUS | Status: AC
  Administered 2014-06-24: 13:00:00 via INTRAVENOUS

## 2014-06-24 NOTE — Progress Notes (Signed)
Patient tolerated infusion well.

## 2014-06-25 ENCOUNTER — Ambulatory Visit (HOSPITAL_COMMUNITY): Payer: Medicare Other

## 2014-06-25 HISTORY — PX: LUMBAR FUSION: SHX111

## 2014-07-01 DIAGNOSIS — Z4789 Encounter for other orthopedic aftercare: Secondary | ICD-10-CM | POA: Diagnosis not present

## 2014-07-01 DIAGNOSIS — IMO0001 Reserved for inherently not codable concepts without codable children: Secondary | ICD-10-CM | POA: Diagnosis not present

## 2014-07-02 DIAGNOSIS — Z981 Arthrodesis status: Secondary | ICD-10-CM | POA: Diagnosis not present

## 2014-07-02 DIAGNOSIS — M47817 Spondylosis without myelopathy or radiculopathy, lumbosacral region: Secondary | ICD-10-CM | POA: Diagnosis not present

## 2014-07-02 DIAGNOSIS — M545 Low back pain, unspecified: Secondary | ICD-10-CM | POA: Diagnosis not present

## 2014-07-03 DIAGNOSIS — Z4789 Encounter for other orthopedic aftercare: Secondary | ICD-10-CM | POA: Diagnosis not present

## 2014-07-03 DIAGNOSIS — IMO0001 Reserved for inherently not codable concepts without codable children: Secondary | ICD-10-CM | POA: Diagnosis not present

## 2014-07-06 DIAGNOSIS — IMO0001 Reserved for inherently not codable concepts without codable children: Secondary | ICD-10-CM | POA: Diagnosis not present

## 2014-07-06 DIAGNOSIS — Z4789 Encounter for other orthopedic aftercare: Secondary | ICD-10-CM | POA: Diagnosis not present

## 2014-07-08 DIAGNOSIS — IMO0001 Reserved for inherently not codable concepts without codable children: Secondary | ICD-10-CM | POA: Diagnosis not present

## 2014-07-08 DIAGNOSIS — Z4789 Encounter for other orthopedic aftercare: Secondary | ICD-10-CM | POA: Diagnosis not present

## 2014-07-10 DIAGNOSIS — IMO0001 Reserved for inherently not codable concepts without codable children: Secondary | ICD-10-CM | POA: Diagnosis not present

## 2014-07-10 DIAGNOSIS — Z4789 Encounter for other orthopedic aftercare: Secondary | ICD-10-CM | POA: Diagnosis not present

## 2014-07-14 DIAGNOSIS — IMO0001 Reserved for inherently not codable concepts without codable children: Secondary | ICD-10-CM | POA: Diagnosis not present

## 2014-07-14 DIAGNOSIS — Z4789 Encounter for other orthopedic aftercare: Secondary | ICD-10-CM | POA: Diagnosis not present

## 2014-07-17 DIAGNOSIS — Z4789 Encounter for other orthopedic aftercare: Secondary | ICD-10-CM | POA: Diagnosis not present

## 2014-07-17 DIAGNOSIS — IMO0001 Reserved for inherently not codable concepts without codable children: Secondary | ICD-10-CM | POA: Diagnosis not present

## 2014-07-21 ENCOUNTER — Encounter (HOSPITAL_COMMUNITY): Payer: Medicare Other | Attending: Oncology

## 2014-07-21 ENCOUNTER — Other Ambulatory Visit: Payer: Self-pay | Admitting: Cardiology

## 2014-07-21 DIAGNOSIS — D509 Iron deficiency anemia, unspecified: Secondary | ICD-10-CM

## 2014-07-21 DIAGNOSIS — K31819 Angiodysplasia of stomach and duodenum without bleeding: Secondary | ICD-10-CM | POA: Diagnosis not present

## 2014-07-21 DIAGNOSIS — D800 Hereditary hypogammaglobulinemia: Secondary | ICD-10-CM

## 2014-07-21 DIAGNOSIS — Z4789 Encounter for other orthopedic aftercare: Secondary | ICD-10-CM | POA: Diagnosis not present

## 2014-07-21 DIAGNOSIS — IMO0001 Reserved for inherently not codable concepts without codable children: Secondary | ICD-10-CM | POA: Diagnosis not present

## 2014-07-21 LAB — IRON AND TIBC
Iron: 47 ug/dL (ref 42–135)
Saturation Ratios: 17 % — ABNORMAL LOW (ref 20–55)
TIBC: 284 ug/dL (ref 250–470)
UIBC: 237 ug/dL (ref 125–400)

## 2014-07-21 LAB — CBC WITH DIFFERENTIAL/PLATELET
Basophils Absolute: 0.1 10*3/uL (ref 0.0–0.1)
Basophils Relative: 1 % (ref 0–1)
EOS ABS: 0.2 10*3/uL (ref 0.0–0.7)
EOS PCT: 3 % (ref 0–5)
HCT: 33.6 % — ABNORMAL LOW (ref 36.0–46.0)
HEMOGLOBIN: 10.7 g/dL — AB (ref 12.0–15.0)
LYMPHS ABS: 0.7 10*3/uL (ref 0.7–4.0)
Lymphocytes Relative: 10 % — ABNORMAL LOW (ref 12–46)
MCH: 31.7 pg (ref 26.0–34.0)
MCHC: 31.8 g/dL (ref 30.0–36.0)
MCV: 99.4 fL (ref 78.0–100.0)
MONO ABS: 0.8 10*3/uL (ref 0.1–1.0)
MONOS PCT: 11 % (ref 3–12)
NEUTROS PCT: 75 % (ref 43–77)
Neutro Abs: 5.5 10*3/uL (ref 1.7–7.7)
Platelets: 357 10*3/uL (ref 150–400)
RBC: 3.38 MIL/uL — ABNORMAL LOW (ref 3.87–5.11)
RDW: 16.9 % — ABNORMAL HIGH (ref 11.5–15.5)
WBC: 7.3 10*3/uL (ref 4.0–10.5)

## 2014-07-21 LAB — FERRITIN: FERRITIN: 128 ng/mL (ref 10–291)

## 2014-07-21 NOTE — Progress Notes (Signed)
LABS FOR KLLC,SPE,FERR,IRON/TIBC,CBCD

## 2014-07-22 ENCOUNTER — Ambulatory Visit (HOSPITAL_COMMUNITY): Payer: Medicare Other | Admitting: Oncology

## 2014-07-22 LAB — KAPPA/LAMBDA LIGHT CHAINS
KAPPA FREE LGHT CHN: 4.59 mg/dL — AB (ref 0.33–1.94)
Kappa, lambda light chain ratio: 0.98 (ref 0.26–1.65)
LAMDA FREE LIGHT CHAINS: 4.69 mg/dL — AB (ref 0.57–2.63)

## 2014-07-22 NOTE — Progress Notes (Signed)
Mary Cahill, MD  [place] [address]  Iron deficiency anemia - Plan: CANCELED: CBC with Differential, CANCELED: Iron and TIBC, CANCELED: Ferritin  Iron deficiency anemia due to chronic blood loss - Plan: CANCELED: CBC with Differential, CANCELED: Iron and TIBC, CANCELED: Ferritin  GAVE (gastric antral vascular ectasia) - Plan: CANCELED: CBC with Differential, CANCELED: Iron and TIBC, CANCELED: Ferritin  Hypogammaglobulinaemia, unspecified - Plan: Comprehensive metabolic panel, Multiple myeloma panel, serum, Kappa/lambda light chains  CURRENT THERAPY:IV Feraheme 510 mg on days 1 and 8 every 12 weeks.  INTERVAL HISTORY: [name] 80 y.o. female returns for  regular  visit for followup of severe iron deficiency anemia secondary to GAVE Syndrome AND Immunoglobulin lab abnormalities without monoclonal spike.  I personally reviewed and went over laboratory results with the patient.  The results are noted within this dictation.  She is S/P L4-L5 fusion with continued sciatica-like pain.  She is on Gabapentin and Hydrocodone.  I will defer to neurosurgeon for that issue, as we will focus on her hematologic issues. She reports that she's having pain down her left leg with discomfort of her skin that is an off and on pain it isn't constant but nearly constant. She reports that she is going to contact her neurosurgeon today before the weekend just to update him and see if there is anything else that needs to be done with regards to her pain management.  From a hematologic standpoint, she is doing well. Her iron studies are being maintained with the current supportive therapy regimen a Feraheme every 3 months. Of note, she does have an increase in her M spike but this is insignificant at this point time. We will monitor on an annual basis.  Hematologically, the patient denies any complaints and ROS questioning is negative. She does note an frequent episodes of black tarry stool.  The most recently, her stools are normal.   Past Medical History  Diagnosis Date  . Hypertension   . Hypothyroidism   . Palpitations   . Anemia   . GAVE (gastric antral vascular ectasia) 09/12/12    has Benign hypertensive heart disease without heart failure; Palpitations; Hypothyroidism; GERD (gastroesophageal reflux disease); Pruritus; Pulmonary hypertension; Pulmonary nodule; Iron deficiency anemia; Chronic respiratory failure; Constipation; Acute posthemorrhagic anemia; GAVE (gastric antral vascular ectasia); and Iron deficiency anemia due to chronic blood loss on her problem list.     is allergic to avapro; clarithromycin; losartan potassium-hctz; maxzide; ziac; penicillins; and sulfa drugs cross reactors.  [name] does not currently have medications on file.  Past Surgical History  Procedure Laterality Date  . Tonsillectomy and adenoidectomy    . US echocardiography  03/12/2006    EF 55-60%  . Knee replacement rt 3 yrs ago    . Back surger x 2    . Abdominal hysterectomy    . Shoulder surgery    . Colonoscopy  07/24/2012    Procedure: COLONOSCOPY;  Surgeon: Rogene Houston, MD;  [facility];  Service: Endoscopy;  Laterality: N/A;  200  . Heel spur surgery    . Esophagogastroduodenoscopy  09/12/2012    Procedure: ESOPHAGOGASTRODUODENOSCOPY (EGD);  Surgeon: Rogene Houston, MD;  [facility];  Service: Endoscopy;  Laterality: N/A;  325-changed to 200 per Ann-pt moved up to [place] to notify pt  . Total abdominal hysterectomy    . Esophagogastroduodenoscopy  11/01/2012    Procedure: ESOPHAGOGASTRODUODENOSCOPY (EGD);  Surgeon: Rogene Houston, MD;  [facility];  Service: Endoscopy;  Laterality: N/A;  1:20  . Hot hemostasis  11/01/2012    Procedure: HOT HEMOSTASIS (ARGON PLASMA COAGULATION/BICAP);  Surgeon: Rogene Houston, MD;  [facility];  Service: Endoscopy;  Laterality: N/A;  . Esophagogastroduodenoscopy N/A 07/04/2013     Procedure: ESOPHAGOGASTRODUODENOSCOPY (EGD);  Surgeon: Rogene Houston, MD;  [facility];  Service: Endoscopy;  Laterality: N/A;  850  . Hot hemostasis N/A 07/04/2013    Procedure: HOT HEMOSTASIS (ARGON PLASMA COAGULATION/BICAP);  Surgeon: Rogene Houston, MD;  [facility];  Service: Endoscopy;  Laterality: N/A;  . Esophagogastroduodenoscopy N/A 08/06/2013    Procedure: ESOPHAGOGASTRODUODENOSCOPY (EGD);  Surgeon: Rogene Houston, MD;  [facility];  Service: Endoscopy;  Laterality: N/A;  1200  . Hot hemostasis N/A 08/06/2013    Procedure: HOT HEMOSTASIS (ARGON PLASMA COAGULATION/BICAP);  Surgeon: Rogene Houston, MD;  [facility];  Service: Endoscopy;  Laterality: N/A;  . Esophagogastroduodenoscopy N/A 10/31/2013    Procedure: ESOPHAGOGASTRODUODENOSCOPY (EGD);  Surgeon: Rogene Houston, MD;  [facility];  Service: Endoscopy;  Laterality: N/A;  125-moved to [place] notified pt  . Hot hemostasis N/A 10/31/2013    Procedure: HOT HEMOSTASIS (ARGON PLASMA COAGULATION/BICAP);  Surgeon: Rogene Houston, MD;  [facility];  Service: Endoscopy;  Laterality: N/A;  . Lumbar fusion  06/25/14    L4 & L5    Denies any headaches, dizziness, double vision, fevers, chills, night sweats, nausea, vomiting, diarrhea, constipation, chest pain, heart palpitations, shortness of breath, blood in stool, black tarry stool, urinary pain, urinary burning, urinary frequency, hematuria.   PHYSICAL EXAMINATION  ECOG PERFORMANCE STATUS: 2 - Symptomatic, <50% confined to bed  Filed Vitals:   07/24/14 0940  BP: 114/57  Pulse: 70  Temp: 97.7 F (36.5 C)  Resp: 22    GENERAL:alert, no distress, well nourished, well developed, comfortable, cooperative and smiling SKIN: skin color, texture, turgor are normal, no rashes or significant lesions HEAD: Normocephalic, No masses, lesions, tenderness or abnormalities EYES: normal, PERRLA, EOMI, Conjunctiva are  pink and non-injected EARS: External ears normal OROPHARYNX:mucous membranes are moist  NECK: supple, no adenopathy, no stridor, non-tender, trachea midline LYMPH:  no palpable lymphadenopathy BREAST:not examined LUNGS: clear to auscultation  HEART: regular rate & rhythm, no murmurs and no gallops ABDOMEN:abdomen soft, non-tender and normal bowel sounds BACK: Back symmetric, no curvature. EXTREMITIES:less then 2 second capillary refill, no joint deformities, effusion, or inflammation, no skin discoloration, no clubbing, no cyanosis, positive findings:  edema bilateral 1+ edema in lower extremities  NEURO: alert & oriented x 3 with fluent speech.  Back brace in place and utilizing rolling walker for stability purposes.   LABORATORY DATA:  CBC    Component Value Date/Time   WBC 7.3 07/21/2014 1032   RBC 3.38* 07/21/2014 1032   RBC 4.12 08/20/2012 1725   HGB 10.7* 07/21/2014 1032   HCT 33.6* 07/21/2014 1032   PLT 357 07/21/2014 1032   MCV 99.4 07/21/2014 1032   MCH 31.7 07/21/2014 1032   MCHC 31.8 07/21/2014 1032   RDW 16.9* 07/21/2014 1032   LYMPHSABS 0.7 07/21/2014 1032   MONOABS 0.8 07/21/2014 1032   EOSABS 0.2 07/21/2014 1032   BASOSABS 0.1 07/21/2014 1032      Chemistry      Component Value Date/Time   NA 138 04/20/2014 1011   K 4.2 04/20/2014 1011   CL 107 04/20/2014 1011   CO2  24 04/20/2014 1011   BUN 19 04/20/2014 1011   CREATININE 0.9 04/20/2014 1011      Component Value Date/Time   CALCIUM 9.0 04/20/2014 1011   ALKPHOS 82 04/20/2014 1011   AST 20 04/20/2014 1011   ALT 15 04/20/2014 1011   BILITOT 0.4 04/20/2014 1011     Lab Results  Component Value Date   IRON 47 07/21/2014   TIBC 284 07/21/2014   FERRITIN 128 07/21/2014     ASSESSMENT:  1. Severe iron deficiency anemia, S/P Feraheme IV 1020 mg on 09/30/2013. Ferrous Sulfate 325 mg failure with intolerance to this medication due to constipation and abdominal discomfort and without good absorption of this medication and will therefore discontinue. Now  on every 3 months IV Feraheme supportive therapy plan developed on 01/23/2014.  2. GAVE Syndrome, cause of #1. Last EGD performed by Dr. Laural Golden on 08/06/2013. Followed by GI.  3. Mild hypogammaglobulinemia in the IgM class. No monoclonal spike is noted.  4. Mild IgA elevation, again without indication of monoclonal spike.  5. Pulmonary nodule, followed by pulmonology 6. L4-L5 fusion by Dr. Carloyn Manner with residual L LE discomfort and neuropathic pain.  Patient Active Problem List   Diagnosis Date Noted  . Iron deficiency anemia due to chronic blood loss 04/20/2014  . Constipation 07/23/2013  . Acute posthemorrhagic anemia 07/23/2013  . GAVE (gastric antral vascular ectasia) 07/23/2013  . Chronic respiratory failure 10/17/2012  . Iron deficiency anemia 10/15/2012  . Pulmonary nodule 09/26/2012  . Pulmonary hypertension 09/18/2012  . Pruritus 06/20/2012  . Benign hypertensive heart disease without heart failure 06/23/2011  . Palpitations 06/23/2011  . Hypothyroidism 06/23/2011  . GERD (gastroesophageal reflux disease) 06/23/2011     PLAN:  1. I personally reviewed and went over laboratory results with the patient.  The results are noted within this dictation. 2. Continue supportive therapy plan as ordered  A. IV Feraheme 510 mg on days 1 and 8 every 12 weeks.  B. CBC diff, iron studies day 1 every 12 weeks 3. MM panel and CMET in 12 months 4. Return in 6 months for follow-up   THERAPY PLAN:  We will continue aforementioned supportive therapy plan and adjust according to her iron needs.  All questions were answered. The patient knows to call the clinic with any problems, questions or concerns. We can certainly see the patient much sooner if necessary.  Patient and plan discussed with Dr. Farrel Gobble and he is in agreement with the aforementioned.   Toren Tucholski 07/24/2014

## 2014-07-23 DIAGNOSIS — IMO0001 Reserved for inherently not codable concepts without codable children: Secondary | ICD-10-CM | POA: Diagnosis not present

## 2014-07-23 DIAGNOSIS — Z4789 Encounter for other orthopedic aftercare: Secondary | ICD-10-CM | POA: Diagnosis not present

## 2014-07-23 LAB — PROTEIN ELECTROPHORESIS, SERUM
ALBUMIN ELP: 45.3 % — AB (ref 55.8–66.1)
ALPHA-1-GLOBULIN: 7.7 % — AB (ref 2.9–4.9)
ALPHA-2-GLOBULIN: 15.4 % — AB (ref 7.1–11.8)
Beta 2: 8 % — ABNORMAL HIGH (ref 3.2–6.5)
Beta Globulin: 6.8 % (ref 4.7–7.2)
Gamma Globulin: 16.8 % (ref 11.1–18.8)
M-Spike, %: 0.25 g/dL
TOTAL PROTEIN ELP: 5.9 g/dL — AB (ref 6.0–8.3)

## 2014-07-24 ENCOUNTER — Encounter (HOSPITAL_BASED_OUTPATIENT_CLINIC_OR_DEPARTMENT_OTHER): Payer: Medicare Other | Admitting: Oncology

## 2014-07-24 ENCOUNTER — Encounter (HOSPITAL_COMMUNITY): Payer: Self-pay | Admitting: Oncology

## 2014-07-24 VITALS — BP 114/57 | HR 70 | Temp 97.7°F | Resp 22 | Wt 152.8 lb

## 2014-07-24 DIAGNOSIS — D509 Iron deficiency anemia, unspecified: Secondary | ICD-10-CM

## 2014-07-24 DIAGNOSIS — K31819 Angiodysplasia of stomach and duodenum without bleeding: Secondary | ICD-10-CM

## 2014-07-24 DIAGNOSIS — R894 Abnormal immunological findings in specimens from other organs, systems and tissues: Secondary | ICD-10-CM

## 2014-07-24 DIAGNOSIS — R911 Solitary pulmonary nodule: Secondary | ICD-10-CM | POA: Diagnosis not present

## 2014-07-24 DIAGNOSIS — D500 Iron deficiency anemia secondary to blood loss (chronic): Secondary | ICD-10-CM | POA: Diagnosis not present

## 2014-07-24 DIAGNOSIS — D801 Nonfamilial hypogammaglobulinemia: Secondary | ICD-10-CM | POA: Diagnosis not present

## 2014-07-24 NOTE — Patient Instructions (Signed)
[facility] Discharge Instructions  RECOMMENDATIONS MADE BY THE CONSULTANT AND ANY TEST RESULTS WILL BE SENT TO YOUR REFERRING PHYSICIAN.  EXAM FINDINGS BY THE PHYSICIAN TODAY AND SIGNS OR SYMPTOMS TO REPORT TO CLINIC OR PRIMARY PHYSICIAN: Exam and findings as discussed by Robynn Pane, PA-C.  Will do labs and feraheme infusions every 3 months.  Report increased fatigue or shortness of breath.    INSTRUCTIONS/FOLLOW-UP: Labs and feraheme every 3 months and office visit in 6 months.  Thank you for choosing [facility] to provide your oncology and hematology care.  To afford each patient quality time with our providers, please arrive at least 15 minutes before your scheduled appointment time.  With your help, our goal is to use those 15 minutes to complete the necessary work-up to ensure our physicians have the information they need to help with your evaluation and healthcare recommendations.    Effective January 1st, 2014, we ask that you re-schedule your appointment with our physicians should you arrive 10 or more minutes late for your appointment.  We strive to give you quality time with our providers, and arriving late affects you and other patients whose appointments are after yours.    Again, thank you for choosing [facility].  Our hope is that these requests will decrease the amount of time that you wait before being seen by our physicians.       _____________________________________________________________  Should you have questions after your visit to [facility], please contact our office at [phone] between the hours of 8:30 a.m. and 4:30 p.m.  Voicemails left after 4:30 p.m. will not be returned until the following business day.  For prescription refill requests, have your pharmacy contact our office with your prescription refill request.    _______________________________________________________________  We hope  that we have given you very good care.  You may receive a patient satisfaction survey in the mail, please complete it and return it as soon as possible.  We value your feedback!  _______________________________________________________________  Have you asked about our STAR program?  STAR stands for Survivorship Training and Rehabilitation, and this is a nationally recognized cancer care program that focuses on survivorship and rehabilitation.  Cancer and cancer treatments may cause problems, such as, pain, making you feel tired and keeping you from doing the things that you need or want to do. Cancer rehabilitation can help. Our goal is to reduce these troubling effects and help you have the best quality of life possible.  You may receive a survey from a nurse that asks questions about your current state of health.  Based on the survey results, all eligible patients will be referred to the [facility] program for an evaluation so we can better serve you!  A frequently asked questions sheet is available upon request.

## 2014-08-04 DIAGNOSIS — M67919 Unspecified disorder of synovium and tendon, unspecified shoulder: Secondary | ICD-10-CM | POA: Diagnosis not present

## 2014-08-18 ENCOUNTER — Other Ambulatory Visit: Payer: Self-pay | Admitting: Cardiology

## 2014-08-20 DIAGNOSIS — Z23 Encounter for immunization: Secondary | ICD-10-CM | POA: Diagnosis not present

## 2014-08-21 ENCOUNTER — Encounter: Payer: Self-pay | Admitting: Internal Medicine

## 2014-08-21 ENCOUNTER — Ambulatory Visit (INDEPENDENT_AMBULATORY_CARE_PROVIDER_SITE_OTHER): Payer: Medicare Other | Admitting: Internal Medicine

## 2014-08-21 VITALS — BP 122/74 | HR 60 | Temp 97.1°F | Ht 61.0 in | Wt 154.0 lb

## 2014-08-21 DIAGNOSIS — J9611 Chronic respiratory failure with hypoxia: Secondary | ICD-10-CM

## 2014-08-21 DIAGNOSIS — I272 Pulmonary hypertension, unspecified: Secondary | ICD-10-CM

## 2014-08-21 DIAGNOSIS — I270 Primary pulmonary hypertension: Secondary | ICD-10-CM | POA: Diagnosis not present

## 2014-08-21 NOTE — Progress Notes (Signed)
Subjective:    Patient ID: [name], female    DOB: 10-09-1933  MRN: [id number]    Brief patient profile:  50 yowf never smoker with new onset sob April 2013 referred 09/18/2012 by Dr Tressie Stalker for sob assoc with anemia with evidence of mod PAH but neg w/u except for noct desat including v/q    History of Present Illness  09/18/2012 1st pulmonary ov/ [name] cc indolent onset sob April 2013 gradually worse through summer and into fall to point just across the room but no problems at rest or lying down, much better since dx with fe def anemia and rx with IV iron. rec Schedule echocardiogram > mod PAH We will place your record in a recall file for one year notification ideally you need a  repeat CT scan to complete the evaluation  Late add : ono RA ordered > pos desat    10/17/2012 f/u ov/[name] cc back to baseline in terms of activity tolerance with no limiting sob- attributes this to iron rx. Denies am ha or hypersomnolence rec  titrate up 02  Sleep study > neg for osa   01/02/2013 f/u ov/[name] cc breathing  much better since starting 02 rec No change rx   04/02/2013 f/u ov/[name] re PAH adjusted to 1lpm and feels fine on this Chief Complaint  Patient presents with  . Follow-up    Breathing has improved and is doing great. Reports no new acute complaints. Denies chest pain, chest tightness, SOB, coughing or wheezing.  not limited by breathing but 4lpm def caused ha so wasn't taking it that way anyway and doing better on 1lpm. rec 2lpm at hs, no change otherwise   09/29/2013 f/u ov/[name] re: unexplained hypoxemia Chief Complaint  Patient presents with  . Followup with PFT    Pt states breathing is doing well and denies any co's today  Only 02 is at bedtime 2lpm  Not limited by breathing at all at ths point  rec Continue 02 2lpm at bedtime Please schedule a follow up visit in 6  months but call sooner if needed  Late add will need repeat echo next ov and cxr    02/21/2014 f/u  ov/[name] re:  PAH/ unexplained hypoxemia  Chief Complaint  Patient presents with  . Follow-up    Pt has no breathing complaints at this time.  Is just getting over the flu from Feb.    Not limited by breathing from desired activities   Got "run down" by flu but no respiratory complaints at all  Still using 2lpm at hs only   rec No change rx = noct 02 only  08/21/2014 f/u ov/[name] re:  Chief Complaint  Patient presents with  . Follow-up    Pt states that her breathing is doing well and she denies any new co's today. She states that she has not had to use albuterol at all.   can walk anywhere she wants to go now, Not limited by breathing from desired activities    No obvious daytime variabilty or assoc chronic cough or cp or chest tightness, subjective wheeze overt sinus or hb symptoms. No unusual exp hx or h/o childhood pna/ asthma or premature birth to her knowledge.   Sleeping ok without nocturnal  or early am exacerbation  of respiratory  c/o's or need for noct saba. Also denies any obvious fluctuation of symptoms with weather or environmental changes or other aggravating or alleviating factors except as outlined above   Current Medications,  Allergies, Past Medical History, Past Surgical History, Family History, and Social History were reviewed in [employer] record.  ROS  The following are not active complaints unless bolded sore throat, dysphagia, dental problems, itching, sneezing,  nasal congestion or excess/ purulent secretions, ear ache,   fever, chills, sweats, unintended wt loss, pleuritic or exertional cp, hemoptysis,  orthopnea pnd or leg swelling, presyncope, palpitations, heartburn, abdominal pain, anorexia, nausea, vomiting, diarrhea  or change in bowel or urinary habits, change in stools or urine, dysuria,hematuria,  rash, arthralgias, visual complaints, headache, numbness weakness or ataxia or problems with walking or coordination,  change in mood/affect  or memory.             Objective:   Physical Exam    Wt 159 10/17/2012  > 01/05/2013  166 > 04/02/2013  162> 161 09/29/2013 >  08/21/2014 154  Wt Readings from Last 3 Encounters:  09/23/12 160 lb (72.576 kg)  09/18/12 160 lb 3.2 oz (72.666 kg)  09/12/12 159 lb (72.122 kg)    amb wf nad  HEENT: nl dentition, turbinates, and orophanx. Nl external ear canals without cough reflex   NECK :  without JVD/Nodes/TM/ nl carotid upstrokes bilaterally   LUNGS: no acc muscle use, clear to A and P bilaterally without cough on insp or exp maneuvers   CV:  RRR  no s3 or murmur  slt increase P2 /  Trace sym bilateral lower ext  edema   ABD:  soft and nontender with nl excursion in the supine position. No bruits or organomegaly, bowel sounds nl  MS:  warm without deformities, calf tenderness, cyanosis or clubbing         08/20/12 CT  1. Scattered pulmonary nodules with a spiculated 6 x 8 mm nodule in  the left lower lobe. If the patient is at high risk for  bronchogenic carcinoma, follow-up chest CT at 3-6 months is  recommended. If the patient is at low risk for bronchogenic  carcinoma, follow-up chest CT at 6-12 months is recommended.  2. Scattered pleural parenchymal scarring with mosaic attenuation.  The latter finding can be seen with small airways disease.  3. Pulmonary arterial hypertension  CT chest 01/21/14 no contrast  COPD changes with peripheral scarring in the upper lobes and mosaic  attenuation/air trapping likely reflecting small airway disease.  Stable spiculated nodular density left lower lobe 8 x 6 mm unchanged  since 08/26/2012.     Assessment & Plan:

## 2014-08-21 NOTE — Assessment & Plan Note (Signed)
-   Echo 09/24/12 >Left ventricle: The cavity size was normal. Wall thickness was normal. Systolic function was normal. The estimated ejection fraction was in the range of 55% to 60%. Wall motion was normal; there were no regional wall motion abnormalities. - Aortic valve: Mild regurgitation. - Right atrium: The atrium was mildly dilated. - Pulmonary arteries: Systolic pressure was moderately increased. PA peak pressure: 55mm Hg  Vs 47 01/28/14 - ONO  RA rec 10/01/2012  >  sats <= 88%  For 8h 40 min > rec 02 2lpm and repeat study 10/24/12 still destat < 88 for 4: 34m so sleep study ordered. - Sleep study 11/19/12 neg osa but desat > see chronic resp failure - V/Q 04/11/13> low prob - PFT's 09/29/13 FEV1  1.06 (61%) ratio 77 and no change p B2,  DLC0 72 corrects to 102    I had an extended summary discussion with the patient today lasting 15 to 20 minutes of a 25 minute visit on the following issues:   Her activity tolerance is normal and her PA systolic has trended down just using noct 02 with no significant risk for PAH worsening other than related to Left heart findings as above.  Therefore rec keep bp and wt down, continue 02 at hs plenty of exercise and f/u with [place] here prn.

## 2014-08-21 NOTE — Patient Instructions (Signed)
Return to clinic for any loss of exercise tolerance and continue the night time 02 indefinitely    If you are satisfied with your treatment plan,  let your doctor know and he/she can either refill your medications or you can return here when your prescription runs out.     If in any way you are not 100% satisfied,  please tell us.  If 100% better, tell your friends!  Pulmonary follow up is as needed

## 2014-08-21 NOTE — Assessment & Plan Note (Signed)
-   Rx = 2lpm started 10/17/2012  Based on ONO  RA rec 10/01/2012  >  sats <= 88%  For 8h 40 min  > 3 h 38 min < 88 @ 3lpm 12/03/12 > rec sleep study > neg osa - 10/17/2012  Walked RA x 3 laps @ 185 ft each stopped due to end of study, sat 90%, no sob - 12/13/12 ONO 4lpm >  1 h 32m 44 sec @ sat < 88 - 04/02/2013  Walked RA x 2laps @ 185 ft each stopped due to  desat 86 - 04/21/13 ONO 2lpm 146.44m at < 88% but cannot tolerate higher settings - 09/29/13 Walked RA  2 laps @ 185 ft each stopped due to desat to 88%  - 02/20/2014  Walked RA x 3 laps @ 185 ft each stopped due to end of study, sats still 90%    rx 2lpm at hs only

## 2014-08-22 ENCOUNTER — Other Ambulatory Visit: Payer: Self-pay | Admitting: Cardiology

## 2014-09-07 ENCOUNTER — Ambulatory Visit (INDEPENDENT_AMBULATORY_CARE_PROVIDER_SITE_OTHER): Payer: Medicare Other | Admitting: Cardiology

## 2014-09-07 ENCOUNTER — Encounter: Payer: Self-pay | Admitting: Cardiology

## 2014-09-07 VITALS — BP 148/84 | HR 75 | Ht 61.0 in | Wt 153.0 lb

## 2014-09-07 DIAGNOSIS — Z8249 Family history of ischemic heart disease and other diseases of the circulatory system: Secondary | ICD-10-CM | POA: Diagnosis not present

## 2014-09-07 DIAGNOSIS — I119 Hypertensive heart disease without heart failure: Secondary | ICD-10-CM

## 2014-09-07 DIAGNOSIS — R002 Palpitations: Secondary | ICD-10-CM

## 2014-09-07 DIAGNOSIS — R10816 Epigastric abdominal tenderness: Secondary | ICD-10-CM | POA: Diagnosis not present

## 2014-09-07 DIAGNOSIS — D509 Iron deficiency anemia, unspecified: Secondary | ICD-10-CM

## 2014-09-07 DIAGNOSIS — R0602 Shortness of breath: Secondary | ICD-10-CM

## 2014-09-07 DIAGNOSIS — R634 Abnormal weight loss: Secondary | ICD-10-CM

## 2014-09-07 LAB — LIPID PANEL
Cholesterol: 148 mg/dL (ref 0–200)
HDL: 44.9 mg/dL (ref >39.00)
LDL CALC: 84 mg/dL (ref 0–99)
NonHDL: 103.1
TRIGLYCERIDES: 95 mg/dL (ref 0.0–149.0)
Total CHOL/HDL Ratio: 3
VLDL: 19 mg/dL (ref 0.0–40.0)

## 2014-09-07 LAB — BASIC METABOLIC PANEL
BUN: 16 mg/dL (ref 6–23)
CALCIUM: 8.8 mg/dL (ref 8.4–10.5)
CO2: 29 mEq/L (ref 19–32)
Chloride: 104 mEq/L (ref 96–112)
Creatinine, Ser: 1.1 mg/dL (ref 0.4–1.2)
GFR: 51.42 mL/min — AB (ref >60.00)
Glucose, Bld: 84 mg/dL (ref 70–99)
POTASSIUM: 3.8 meq/L (ref 3.5–5.1)
Sodium: 138 mEq/L (ref 135–145)

## 2014-09-07 LAB — HEPATIC FUNCTION PANEL
ALT: 15 U/L (ref 0–35)
AST: 18 U/L (ref 0–37)
Albumin: 2.8 g/dL — ABNORMAL LOW (ref 3.5–5.2)
Alkaline Phosphatase: 95 U/L (ref 39–117)
Bilirubin, Direct: 0.1 mg/dL (ref 0.0–0.3)
TOTAL PROTEIN: 6.9 g/dL (ref 6.0–8.3)
Total Bilirubin: 0.4 mg/dL (ref 0.2–1.2)

## 2014-09-07 NOTE — Progress Notes (Signed)
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 She has a past history of angina hypertension and a history of frequent premature atrial beats. She had a  nuclear stress test  on 12/21/11 because of exertional dyspnea. She was found to have no ischemia and her ejection fraction was 83% by Myoview. She subsequently was found to have severe iron deficiency. She was evaluated by Dr. Abran Duke who gave her IV iron infusion and she felt better almost immediately. She had been experiencing itching of her skin which resolved and she also had been craving ice which resolved after the iron infusion. The patient had an echocardiogram on 09/24/12 which showed normal left ventricular systolic function with an ejection fraction of 55-60%. She had moderate pulmonary hypertension with a pulmonary artery pressure of 54 and she has had a subsequent sleep study and is being followed by pulmonary.  She had an updated echocardiogram on 01/29/14 which showed an ejection fraction of 55-60% with grade 2 diastolic dysfunction and a pulmonary artery pressure of 47 which was down from a previous level of 54.  There was mild aortic insufficiency. She is still using oxygen 2 L per minute at night but does not have to use it during the day when she is more active. Recently the patient had recurrent GI bleed. She has a diagnosis of GAVE.  She is followed by hematology.  She mentioned an interesting family history to me today.  Brother had an aneurysm of the descending aorta.  Her sister had what sounds like a aortic dissection of her descending thoracic aorta.  The patient is concerned that she be checked for aneurysms herself. Since we last saw the patient she had back surgery by Dr. Carloyn Manner on 06/25/14.  She spent 4 nights in  the hospital.   Current Outpatient Prescriptions  Medication Sig Dispense Refill  . albuterol (PROVENTIL) (2.5 MG/3ML) 0.083% nebulizer solution Take 2.5 mg by nebulization every 6 (six) hours as needed for wheezing or shortness of breath.      Marland Kitchen amLODipine (NORVASC) 2.5 MG tablet TAKE 1 TABLET BY MOUTH EVERY DAY  90 tablet  3  . DIGOX 125 MCG tablet TAKE 1 TABLET BY MOUTH EVERY DAY AS DIRECTED- TAKE NO TABLET ON WEDNESDAY OR SUNDAY  30 tablet  3  . gabapentin (NEURONTIN) 300 MG capsule Take 300 mg by mouth 2 (two) times daily.      . hydrALAZINE (APRESOLINE) 10 MG tablet TAKE 1 TABLET BY MOUTH TWICE DAILY  180 tablet  0  . levothyroxine (SYNTHROID, LEVOTHROID) 112 MCG tablet Take 112 mcg by mouth daily before breakfast.      . metoprolol tartrate (LOPRESSOR) 25 MG tablet TAKE 1 TABLET BY MOUTH EVERY DAY  90 tablet  0  . pantoprazole (PROTONIX) 40 MG tablet TAKE 1 TABLET BY MOUTH EVERY DAY  90 tablet  0  . HYDROcodone-acetaminophen (NORCO/VICODIN) 5-325 MG per tablet Take 1 tablet by mouth every 6 (six) hours as needed for moderate pain.      . polyethylene glycol powder (GLYCOLAX/MIRALAX) powder Take 17 g by mouth daily as needed for moderate constipation.        No current facility-administered medications for this visit.    Allergies  Allergen Reactions  .  Avapro [Irbesartan] Other (See Comments)    Cough   . Clarithromycin Other (See Comments)    Unknown   . Losartan Potassium-Hctz Other (See Comments)    Unknown  . Maxzide [Hydrochlorothiazide W-Triamterene] Other (See Comments)    Unknown   . Ziac [Bisoprolol-Hydrochlorothiazide]     Thinks caused itching and cough 08/12/12  . Penicillins Rash  . Sulfa Drugs Cross Reactors Rash    Patient Active Problem List   Diagnosis Date Noted  . Family history of aortic aneurysm 09/07/2014  . Iron deficiency anemia due to chronic blood loss 04/20/2014  . Constipation 07/23/2013  . Acute posthemorrhagic anemia 07/23/2013  . GAVE  (gastric antral vascular ectasia) 07/23/2013  . Chronic respiratory failure 10/17/2012  . Iron deficiency anemia 10/15/2012  . Pulmonary nodule 09/26/2012  . Pulmonary hypertension 09/18/2012  . Pruritus 06/20/2012  . Benign hypertensive heart disease without heart failure 06/23/2011  . Palpitations 06/23/2011  . Hypothyroidism 06/23/2011  . GERD (gastroesophageal reflux disease) 06/23/2011    History  Smoking status  . Never Smoker   Smokeless tobacco  . Never Used    History  Alcohol Use No    Family History  Problem Relation Age of Onset  . Hypertension Mother   . Hypertension Father   . Heart attack Father     Review of Systems: Constitutional: no fever chills diaphoresis or fatigue or change in weight.  Head and neck: no hearing loss, no epistaxis, no photophobia or visual disturbance. Respiratory: No cough, shortness of breath or wheezing. Cardiovascular: No chest pain peripheral edema, palpitations. Gastrointestinal: No abdominal distention, no abdominal pain, no change in bowel habits hematochezia or melena. Genitourinary: No dysuria, no frequency, no urgency, no nocturia. Musculoskeletal:No arthralgias, no back pain, no gait disturbance or myalgias. Neurological: No dizziness, no headaches, no numbness, no seizures, no syncope, no weakness, no tremors. Hematologic: No lymphadenopathy, no easy bruising. Psychiatric: No confusion, no hallucinations, no sleep disturbance.    Physical Exam: Filed Vitals:   09/07/14 0926  BP: 148/84  Pulse: 75   the general appearance reveals a well-developed well-nourished woman in no distress.The head and neck exam reveals pupils equal and reactive.  Extraocular movements are full.  There is no scleral icterus.  The mouth and pharynx are normal.  The neck is supple.  The carotids reveal no bruits.  The jugular venous pressure is normal.  The  thyroid is not enlarged.  There is no lymphadenopathy.  The chest is clear to  percussion and auscultation.  There are no rales or rhonchi.  Expansion of the chest is symmetrical.  The precordium is quiet.  Occasional premature beats are noted on auscultation. The first heart sound is normal.  The second heart sound is physiologically split.  There is no murmur gallop rub or click.  There is no abnormal lift or heave.  The abdomen is soft and slightly tender in midepigastrium.  Her aortic pulsation is prominent.  The bowel sounds are normal.  The liver and spleen are not enlarged.  There are no abdominal masses.  There are no abdominal bruits.  Extremities reveal good pedal pulses.  There is no phlebitis or edema.  There is no cyanosis or clubbing.  Strength is normal and symmetrical in all extremities.  There is no lateralizing weakness.  There are no sensory deficits.  The skin is warm and dry.  There is no rash.     Assessment / Plan: 1. benign hypertensive heart disease without heart failure. 2.  GAVE (gastric antral vascular ectasia) 3. chronic iron deficiency anemia secondary to gastric antral vascular ectasia. 4. pulmonary hypertension 5. stable dyspnea on exertion, multifactorial. 6. strong family history of thoracic aneurysm in brother and sister.  Prominent abdominal aortic pulsation.  Plan: Continue on same medication.  Lab work pending.  Recheck in 4 months for office visit and EKG. Arrange for CT of chest and abdomen with contrast.  Her renal function has been normal.

## 2014-09-07 NOTE — Patient Instructions (Addendum)
Will obtain labs today and call you with the results (lp/bmet/hfp)  Your physician recommends that you continue on your current medications as directed. Please refer to the Current Medication list given to you today.  Will arrange for you to get a ct of the chest and abdomen at [place] physician recommends that you schedule a follow-up appointment in: 4 month ov/ekg

## 2014-09-07 NOTE — Assessment & Plan Note (Signed)
As noted the patient has a brother with an ascending aortic aneurysm and a sister who had a dissection of her descending thoracic aorta.  We will arrange for her to have CT of the chest and abdomen with contrast.

## 2014-09-07 NOTE — Assessment & Plan Note (Addendum)
Blood pressure today is up slightly.  Typically her blood pressure is not as high.  We will continue current medication.  She will monitor her blood pressure closely at home and let us know if it persists elevated.  She has had some dyspnea.

## 2014-09-07 NOTE — Assessment & Plan Note (Signed)
Patient has a history of palpitations secondary to frequent PACs.  These have improved on beta blocker and digoxin.  She has not been having any sustained tachycardia

## 2014-09-08 NOTE — Progress Notes (Signed)
Quick Note:  Please report to patient. The recent labs are stable. Continue same medication and careful diet. ______ 

## 2014-09-11 ENCOUNTER — Ambulatory Visit (HOSPITAL_COMMUNITY): Admission: RE | Admit: 2014-09-11 | Payer: Medicare Other | Source: Ambulatory Visit

## 2014-09-11 ENCOUNTER — Ambulatory Visit (HOSPITAL_COMMUNITY)
Admission: RE | Admit: 2014-09-11 | Discharge: 2014-09-11 | Disposition: A | Discharge status: Home / Self Care | Payer: Medicare Other | Source: Ambulatory Visit | Attending: Cardiology | Admitting: Cardiology

## 2014-09-11 DIAGNOSIS — R079 Chest pain, unspecified: Secondary | ICD-10-CM | POA: Insufficient documentation

## 2014-09-11 DIAGNOSIS — Z8249 Family history of ischemic heart disease and other diseases of the circulatory system: Secondary | ICD-10-CM

## 2014-09-11 DIAGNOSIS — I700 Atherosclerosis of aorta: Secondary | ICD-10-CM | POA: Insufficient documentation

## 2014-09-11 DIAGNOSIS — I10 Essential (primary) hypertension: Secondary | ICD-10-CM | POA: Insufficient documentation

## 2014-09-11 DIAGNOSIS — Z8489 Family history of other specified conditions: Secondary | ICD-10-CM | POA: Insufficient documentation

## 2014-09-11 DIAGNOSIS — R918 Other nonspecific abnormal finding of lung field: Secondary | ICD-10-CM | POA: Diagnosis not present

## 2014-09-11 DIAGNOSIS — R634 Abnormal weight loss: Secondary | ICD-10-CM

## 2014-09-11 DIAGNOSIS — Z9889 Other specified postprocedural states: Secondary | ICD-10-CM | POA: Insufficient documentation

## 2014-09-11 DIAGNOSIS — R10816 Epigastric abdominal tenderness: Secondary | ICD-10-CM

## 2014-09-11 DIAGNOSIS — R590 Localized enlarged lymph nodes: Secondary | ICD-10-CM | POA: Diagnosis not present

## 2014-09-11 DIAGNOSIS — J984 Other disorders of lung: Secondary | ICD-10-CM | POA: Diagnosis not present

## 2014-09-11 DIAGNOSIS — K769 Liver disease, unspecified: Secondary | ICD-10-CM | POA: Diagnosis not present

## 2014-09-11 DIAGNOSIS — R0602 Shortness of breath: Secondary | ICD-10-CM

## 2014-09-11 MED ORDER — IOHEXOL 300 MG/ML  SOLN
100.0000 mL | Freq: Once | INTRAMUSCULAR | Status: AC | PRN
  Administered 2014-09-11: 100 mL via INTRAVENOUS

## 2014-09-14 ENCOUNTER — Other Ambulatory Visit (HOSPITAL_COMMUNITY): Payer: Self-pay

## 2014-09-14 DIAGNOSIS — D509 Iron deficiency anemia, unspecified: Secondary | ICD-10-CM

## 2014-09-16 ENCOUNTER — Encounter: Payer: Self-pay | Admitting: Cardiology

## 2014-09-17 ENCOUNTER — Other Ambulatory Visit: Payer: Self-pay | Admitting: Cardiovascular Disease

## 2014-09-18 ENCOUNTER — Other Ambulatory Visit: Payer: Self-pay

## 2014-09-19 ENCOUNTER — Other Ambulatory Visit: Payer: Self-pay | Admitting: Cardiology

## 2014-09-23 ENCOUNTER — Encounter (HOSPITAL_COMMUNITY): Payer: Medicare Other | Attending: Oncology

## 2014-09-23 ENCOUNTER — Encounter (HOSPITAL_COMMUNITY): Payer: Self-pay

## 2014-09-23 ENCOUNTER — Encounter (HOSPITAL_BASED_OUTPATIENT_CLINIC_OR_DEPARTMENT_OTHER): Payer: Medicare Other

## 2014-09-23 DIAGNOSIS — D509 Iron deficiency anemia, unspecified: Secondary | ICD-10-CM | POA: Insufficient documentation

## 2014-09-23 DIAGNOSIS — K31819 Angiodysplasia of stomach and duodenum without bleeding: Secondary | ICD-10-CM | POA: Insufficient documentation

## 2014-09-23 LAB — CBC WITH DIFFERENTIAL/PLATELET
Basophils Absolute: 0.1 10*3/uL (ref 0.0–0.1)
Basophils Relative: 2 % — ABNORMAL HIGH (ref 0–1)
Eosinophils Absolute: 0.2 10*3/uL (ref 0.0–0.7)
Eosinophils Relative: 2 % (ref 0–5)
HEMATOCRIT: 34.6 % — AB (ref 36.0–46.0)
HEMOGLOBIN: 10.8 g/dL — AB (ref 12.0–15.0)
Lymphocytes Relative: 11 % — ABNORMAL LOW (ref 12–46)
Lymphs Abs: 0.9 10*3/uL (ref 0.7–4.0)
MCH: 26.5 pg (ref 26.0–34.0)
MCHC: 31.2 g/dL (ref 30.0–36.0)
MCV: 84.8 fL (ref 78.0–100.0)
MONO ABS: 0.8 10*3/uL (ref 0.1–1.0)
MONOS PCT: 10 % (ref 3–12)
NEUTROS ABS: 6.2 10*3/uL (ref 1.7–7.7)
NEUTROS PCT: 75 % (ref 43–77)
Platelets: 361 10*3/uL (ref 150–400)
RBC: 4.08 MIL/uL (ref 3.87–5.11)
RDW: 16.5 % — ABNORMAL HIGH (ref 11.5–15.5)
WBC: 8.3 10*3/uL (ref 4.0–10.5)

## 2014-09-23 LAB — IRON AND TIBC
Iron: 23 ug/dL — ABNORMAL LOW (ref 42–135)
Saturation Ratios: 6 % — ABNORMAL LOW (ref 20–55)
TIBC: 408 ug/dL (ref 250–470)
UIBC: 385 ug/dL (ref 125–400)

## 2014-09-23 LAB — FERRITIN: Ferritin: 13 ng/mL (ref 10–291)

## 2014-09-23 MED ORDER — SODIUM CHLORIDE 0.9 % IV SOLN
Freq: Once | INTRAVENOUS | Status: AC
  Administered 2014-09-23: 10:00:00 via INTRAVENOUS

## 2014-09-23 MED ORDER — SODIUM CHLORIDE 0.9 % IV SOLN
510.0000 mg | Freq: Once | INTRAVENOUS | Status: AC
  Administered 2014-09-23: 510 mg via INTRAVENOUS
  Filled 2014-09-23: qty 17

## 2014-09-23 MED ORDER — SODIUM CHLORIDE 0.9 % IJ SOLN: 10.0000 mL | INTRAMUSCULAR | Status: DC | PRN

## 2014-09-23 NOTE — Patient Instructions (Signed)
[facility] Discharge Instructions  RECOMMENDATIONS MADE BY THE CONSULTANT AND ANY TEST RESULTS WILL BE SENT TO YOUR REFERRING PHYSICIAN.  EXAM FINDINGS BY THE PHYSICIAN TODAY AND SIGNS OR SYMPTOMS TO REPORT TO CLINIC OR PRIMARY PHYSICIAN:    Today you received Feraheme 510mg   Return in 1 week for 2nd infusion of Feraheme on Wed Nov 18 @ 2:15   Thank you for choosing [facility] to provide your oncology and hematology care.  To afford each patient quality time with our providers, please arrive at least 15 minutes before your scheduled appointment time.  With your help, our goal is to use those 15 minutes to complete the necessary work-up to ensure our physicians have the information they need to help with your evaluation and healthcare recommendations.    Effective January 1st, 2014, we ask that you re-schedule your appointment with our physicians should you arrive 10 or more minutes late for your appointment.  We strive to give you quality time with our providers, and arriving late affects you and other patients whose appointments are after yours.    Again, thank you for choosing [facility].  Our hope is that these requests will decrease the amount of time that you wait before being seen by our physicians.       _____________________________________________________________  Should you have questions after your visit to [facility], please contact our office at [phone] between the hours of 8:30 a.m. and 4:30 p.m.  Voicemails left after 4:30 p.m. will not be returned until the following business day.  For prescription refill requests, have your pharmacy contact our office with your prescription refill request.    _______________________________________________________________  We hope that we have given you very good care.  You may receive a patient satisfaction survey in the mail, please complete it and return it as soon as  possible.  We value your feedback!  _______________________________________________________________  Have you asked about our STAR program?  STAR stands for Survivorship Training and Rehabilitation, and this is a nationally recognized cancer care program that focuses on survivorship and rehabilitation.  Cancer and cancer treatments may cause problems, such as, pain, making you feel tired and keeping you from doing the things that you need or want to do. Cancer rehabilitation can help. Our goal is to reduce these troubling effects and help you have the best quality of life possible.  You may receive a survey from a nurse that asks questions about your current state of health.  Based on the survey results, all eligible patients will be referred to the [facility] program for an evaluation so we can better serve you!  A frequently asked questions sheet is available upon request.

## 2014-09-23 NOTE — Progress Notes (Signed)
LABS FOR CBCD,IRON/TIBC, FERR 

## 2014-09-23 NOTE — Progress Notes (Signed)
Tolerated infusion well. 

## 2014-09-24 ENCOUNTER — Other Ambulatory Visit (HOSPITAL_COMMUNITY): Payer: Self-pay | Admitting: Oncology

## 2014-09-24 DIAGNOSIS — D509 Iron deficiency anemia, unspecified: Secondary | ICD-10-CM

## 2014-09-30 ENCOUNTER — Encounter (HOSPITAL_BASED_OUTPATIENT_CLINIC_OR_DEPARTMENT_OTHER): Payer: Medicare Other

## 2014-09-30 ENCOUNTER — Encounter (HOSPITAL_COMMUNITY): Payer: Self-pay

## 2014-09-30 ENCOUNTER — Ambulatory Visit (HOSPITAL_COMMUNITY): Payer: Medicare Other

## 2014-09-30 DIAGNOSIS — K31819 Angiodysplasia of stomach and duodenum without bleeding: Secondary | ICD-10-CM

## 2014-09-30 DIAGNOSIS — D509 Iron deficiency anemia, unspecified: Secondary | ICD-10-CM | POA: Diagnosis not present

## 2014-09-30 MED ORDER — SODIUM CHLORIDE 0.9 % IV SOLN
510.0000 mg | Freq: Once | INTRAVENOUS | Status: AC
  Administered 2014-09-30: 510 mg via INTRAVENOUS
  Filled 2014-09-30: qty 17

## 2014-09-30 MED ORDER — SODIUM CHLORIDE 0.9 % IV SOLN
Freq: Once | INTRAVENOUS | Status: AC
  Administered 2014-09-30: 15:00:00 via INTRAVENOUS

## 2014-09-30 NOTE — Progress Notes (Signed)
Patient tolerated infusion well.

## 2014-09-30 NOTE — Patient Instructions (Signed)
[facility] Discharge Instructions  RECOMMENDATIONS MADE BY THE CONSULTANT AND ANY TEST RESULTS WILL BE SENT TO YOUR REFERRING PHYSICIAN.  Your were given Feraheme today. Please call for any questions or concerns. Follow up as scheduled.   Thank you for choosing [facility] to provide your oncology and hematology care.  To afford each patient quality time with our providers, please arrive at least 15 minutes before your scheduled appointment time.  With your help, our goal is to use those 15 minutes to complete the necessary work-up to ensure our physicians have the information they need to help with your evaluation and healthcare recommendations.    Effective January 1st, 2014, we ask that you re-schedule your appointment with our physicians should you arrive 10 or more minutes late for your appointment.  We strive to give you quality time with our providers, and arriving late affects you and other patients whose appointments are after yours.    Again, thank you for choosing [facility].  Our hope is that these requests will decrease the amount of time that you wait before being seen by our physicians.       _____________________________________________________________  Should you have questions after your visit to [facility], please contact our office at [phone] between the hours of 8:30 a.m. and 4:30 p.m.  Voicemails left after 4:30 p.m. will not be returned until the following business day.  For prescription refill requests, have your pharmacy contact our office with your prescription refill request.    _______________________________________________________________  We hope that we have given you very good care.  You may receive a patient satisfaction survey in the mail, please complete it and return it as soon as possible.  We value your feedback!  _______________________________________________________________  Have  you asked about our STAR program?  STAR stands for Survivorship Training and Rehabilitation, and this is a nationally recognized cancer care program that focuses on survivorship and rehabilitation.  Cancer and cancer treatments may cause problems, such as, pain, making you feel tired and keeping you from doing the things that you need or want to do. Cancer rehabilitation can help. Our goal is to reduce these troubling effects and help you have the best quality of life possible.  You may receive a survey from a nurse that asks questions about your current state of health.  Based on the survey results, all eligible patients will be referred to the [facility] program for an evaluation so we can better serve you!  A frequently asked questions sheet is available upon request.

## 2014-10-01 ENCOUNTER — Telehealth (INDEPENDENT_AMBULATORY_CARE_PROVIDER_SITE_OTHER): Payer: Self-pay | Admitting: *Deleted

## 2014-10-01 NOTE — Telephone Encounter (Signed)
Patient was called and per her husband she was at the barn,he was going to have her call our office back . Forwarded to Dr.Rehman, as he stated he was going to call her over the weekend.

## 2014-10-01 NOTE — Telephone Encounter (Signed)
[name] said her iron is low again. Down to 38 on 09/23/14. Dr. Laural Golden was going in to look at her stomach when this happened before. It was called watermelon, she thinks. Would like to know if Dr. Laural Golden thinks the procedure needs to be done? Her return phone number is [phone]. She has not had to come in for a visit, Dr. Laural Golden usually goes straight to a procedure.

## 2014-10-01 NOTE — Telephone Encounter (Signed)
Dr.Rehman was made aware. He states that he will talk with the patient over the weekend. I have called the patient to ask if she is having tarry stools.

## 2014-10-04 NOTE — Telephone Encounter (Signed)
Lab studies reviewed and I talked with patient. Last EGD with APC therapy for gait was in December 2014 and may have helped. Will proceed with EGD with APC therapy in near future.

## 2014-10-05 ENCOUNTER — Telehealth (INDEPENDENT_AMBULATORY_CARE_PROVIDER_SITE_OTHER): Payer: Self-pay | Admitting: *Deleted

## 2014-10-05 ENCOUNTER — Encounter (INDEPENDENT_AMBULATORY_CARE_PROVIDER_SITE_OTHER): Payer: Self-pay | Admitting: *Deleted

## 2014-10-05 ENCOUNTER — Other Ambulatory Visit (INDEPENDENT_AMBULATORY_CARE_PROVIDER_SITE_OTHER): Payer: Self-pay | Admitting: *Deleted

## 2014-10-05 DIAGNOSIS — K31819 Angiodysplasia of stomach and duodenum without bleeding: Secondary | ICD-10-CM

## 2014-10-05 NOTE — Telephone Encounter (Signed)
EGD w/ APC sch's 10/23/14, patient aware

## 2014-10-05 NOTE — Telephone Encounter (Signed)
Referring MD/PCP: hall   Procedure: egd with apc  Reason/Indication:  gave  Has patient had this procedure before?  Yes, 10/2013  If so, when, by whom and where?    Is there a family history of colon cancer?    Who?  What age when diagnosed?    Is patient diabetic?   no      Does patient have prosthetic heart valve?  no  Do you have a pacemaker?  no  Has patient ever had endocarditis? no  Has patient had joint replacement within last 12 months?  no  Does patient tend to be constipated or take laxatives? no  Is patient on Coumadin, Plavix and/or Aspirin? no  Medications: see epic  Allergies: see epic  Medication Adjustment:   Procedure date & time: 10/23/14 at 1020

## 2014-10-06 NOTE — Telephone Encounter (Signed)
agree

## 2014-10-16 ENCOUNTER — Other Ambulatory Visit: Payer: Self-pay | Admitting: Cardiology

## 2014-10-23 ENCOUNTER — Ambulatory Visit (HOSPITAL_COMMUNITY)
Admission: RE | Admit: 2014-10-23 | Discharge: 2014-10-23 | Disposition: A | Discharge status: Home / Self Care | Payer: Medicare Other | Source: Ambulatory Visit | Attending: Internal Medicine | Admitting: Internal Medicine

## 2014-10-23 ENCOUNTER — Encounter (HOSPITAL_COMMUNITY)
Admission: RE | Disposition: A | Discharge status: Home / Self Care | Payer: Self-pay | Source: Ambulatory Visit | Attending: Internal Medicine

## 2014-10-23 ENCOUNTER — Encounter (HOSPITAL_COMMUNITY): Payer: Self-pay | Admitting: *Deleted

## 2014-10-23 DIAGNOSIS — Z888 Allergy status to other drugs, medicaments and biological substances status: Secondary | ICD-10-CM | POA: Diagnosis not present

## 2014-10-23 DIAGNOSIS — K319 Disease of stomach and duodenum, unspecified: Secondary | ICD-10-CM | POA: Insufficient documentation

## 2014-10-23 DIAGNOSIS — Z8249 Family history of ischemic heart disease and other diseases of the circulatory system: Secondary | ICD-10-CM | POA: Diagnosis not present

## 2014-10-23 DIAGNOSIS — Z882 Allergy status to sulfonamides status: Secondary | ICD-10-CM | POA: Insufficient documentation

## 2014-10-23 DIAGNOSIS — D500 Iron deficiency anemia secondary to blood loss (chronic): Secondary | ICD-10-CM | POA: Insufficient documentation

## 2014-10-23 DIAGNOSIS — Z880 Allergy status to penicillin: Secondary | ICD-10-CM | POA: Diagnosis not present

## 2014-10-23 DIAGNOSIS — E039 Hypothyroidism, unspecified: Secondary | ICD-10-CM | POA: Insufficient documentation

## 2014-10-23 DIAGNOSIS — K31819 Angiodysplasia of stomach and duodenum without bleeding: Secondary | ICD-10-CM

## 2014-10-23 DIAGNOSIS — R002 Palpitations: Secondary | ICD-10-CM | POA: Diagnosis not present

## 2014-10-23 DIAGNOSIS — Z96651 Presence of right artificial knee joint: Secondary | ICD-10-CM | POA: Diagnosis not present

## 2014-10-23 DIAGNOSIS — I10 Essential (primary) hypertension: Secondary | ICD-10-CM | POA: Insufficient documentation

## 2014-10-23 DIAGNOSIS — D649 Anemia, unspecified: Secondary | ICD-10-CM | POA: Insufficient documentation

## 2014-10-23 DIAGNOSIS — Z8719 Personal history of other diseases of the digestive system: Secondary | ICD-10-CM | POA: Diagnosis not present

## 2014-10-23 DIAGNOSIS — K31811 Angiodysplasia of stomach and duodenum with bleeding: Secondary | ICD-10-CM | POA: Diagnosis not present

## 2014-10-23 HISTORY — PX: HOT HEMOSTASIS: SHX5433

## 2014-10-23 HISTORY — PX: ESOPHAGOGASTRODUODENOSCOPY: SHX5428

## 2014-10-23 SURGERY — EGD (ESOPHAGOGASTRODUODENOSCOPY)
Anesthesia: Moderate Sedation

## 2014-10-23 MED ORDER — MEPERIDINE HCL 50 MG/ML IJ SOLN
INTRAMUSCULAR | Status: DC | PRN
  Administered 2014-10-23 (×2): 25 mg via INTRAVENOUS

## 2014-10-23 MED ORDER — MIDAZOLAM HCL 5 MG/5ML IJ SOLN
INTRAMUSCULAR | Status: DC | PRN
  Administered 2014-10-23 (×2): 2 mg via INTRAVENOUS

## 2014-10-23 MED ORDER — SUCRALFATE 1 GM/10ML PO SUSP: 1.0000 g | Freq: Three times a day (TID) | ORAL | Status: DC

## 2014-10-23 MED ORDER — MIDAZOLAM HCL 5 MG/5ML IJ SOLN
INTRAMUSCULAR | Status: AC
  Filled 2014-10-23: qty 10

## 2014-10-23 MED ORDER — SODIUM CHLORIDE 0.9 % IV SOLN
INTRAVENOUS | Status: DC
  Administered 2014-10-23: 1000 mL via INTRAVENOUS

## 2014-10-23 MED ORDER — SIMETHICONE 40 MG/0.6ML PO SUSP
ORAL | Status: DC | PRN
  Administered 2014-10-23: 09:00:00

## 2014-10-23 MED ORDER — BUTAMBEN-TETRACAINE-BENZOCAINE 2-2-14 % EX AERO
INHALATION_SPRAY | CUTANEOUS | Status: DC | PRN
  Administered 2014-10-23: 2 via TOPICAL

## 2014-10-23 MED ORDER — MEPERIDINE HCL 50 MG/ML IJ SOLN
INTRAMUSCULAR | Status: AC
  Filled 2014-10-23: qty 1

## 2014-10-23 NOTE — Discharge Instructions (Signed)
Resume usual medications. Sucralfate 1 g by mouth 30-60 minutes before each meal and bedtime for 2 weeks. Full liquids today. Usual diet starting tomorrow morning. No driving for 24 hours.  Esophagogastroduodenoscopy Care After Refer to this sheet in the next few weeks. These instructions provide you with information on caring for yourself after your procedure. Your caregiver may also give you more specific instructions. Your treatment has been planned according to current medical practices, but problems sometimes occur. Call your caregiver if you have any problems or questions after your procedure.  HOME CARE INSTRUCTIONS  Do not eat or drink anything until the numbing medicine (local anesthetic) has worn off and your gag reflex has returned. You will know that the local anesthetic has worn off when you can swallow comfortably.  Do not drive for 12 hours after the procedure or as directed by your caregiver.  Only take medicines as directed by your caregiver. SEEK MEDICAL CARE IF:   You cannot stop coughing.  You are not urinating at all or less than usual. SEEK IMMEDIATE MEDICAL CARE IF:  You have difficulty swallowing.  You cannot eat or drink.  You have worsening throat or chest pain.  You have dizziness, lightheadedness, or you faint.  You have nausea or vomiting.  You have chills.  You have a fever.  You have severe abdominal pain.  You have black, tarry, or bloody stools. Document Released: 10/16/2012 Document Reviewed: 10/16/2012 [facility] Patient Information 2015 [place]. This information is not intended to replace advice given to you by your health care provider. Make sure you discuss any questions you have with your health care provider.

## 2014-10-23 NOTE — Op Note (Signed)
EGD PROCEDURE REPORT  PATIENT:  [name]  MR#:  [id number] Birthdate:  [birth year], 80 y.o., female Endoscopist:  Dr. Rogene Houston, MD Referred By:  Dr. [name], MD Procedure Date: 10/23/2014  Procedure:   EGD  Indications:  Patient is 80 year old Caucasian female who has recurrent iron deficiency anemia secondary to GI blood loss from gastric antral gastric ectasia. Patient's hemoglobin has dropped again longer dropping iron and iron saturation she received 2 doses of Feraheme. Patient is undergoing therapeutic EGD. Last EGD with APC therapy was one year ago.            Informed Consent:  The risks, benefits, alternatives & imponderables which include, but are not limited to, bleeding, infection, perforation, drug reaction and potential missed lesion have been reviewed.  The potential for biopsy, lesion removal, esophageal dilation, etc. have also been discussed.  Questions have been answered.  All parties agreeable.  Please see history & physical in medical record for more information.  Medications:  Demerol 50 mg IV Versed 4 mg IV Cetacaine spray topically for oropharyngeal anesthesia  Description of procedure:  The endoscope was introduced through the mouth and advanced to the second portion of the duodenum without difficulty or limitations. The mucosal surfaces were surveyed very carefully during advancement of the scope and upon withdrawal.  Findings:  Esophagus:  Mucosa of the esophagus was normal. GE junction was unremarkable. GEJ:  40 cm Stomach:  Stomach was empty and distended very well with insufflation. Folds in the proximal stomach were normal. Few small hyperplastic appearing polyps noted at gastric body. Extensive telangiectasia noted at gastric antrum. Once again the prominent fold identified. This fold has been previously biopsied and revealed reactive gastropathy and GAVE. These lesion extended to pyloric channel. Pyloric channel was patent. Mucosa was friable.  Fundus was unremarkable. Few telangiectasia noted below GEJ. Duodenum:  Bulbar mucosa was normal. Small free-floating fresh clot noted in second part of the duodenum source of which is felt to be stomach.  Therapeutic/Diagnostic Maneuvers Performed:   Multiple telangiectasia with ablated with argon plasma coagulator. Some of these lesions bled during treatment which was easily controlled with further application of APC.  Complications:  None  Impression: Extensive gastric antral Vassar ectasia along with few at cardia. Majority of these lesions were ablated with argon plasma coagulation.  Recommendations:  Sucralfate 1 g by mouth before meals and daily at bedtime for 2 weeks. Full liquids for 24 hours and then usual diet. Will monitor H&H and an studies and determine timing of next procedure.  REHMAN,NAJEEB U  10/23/2014  9:24 AM  CC: Dr. [name], MD & Dr. Rayne Du ref. provider found

## 2014-10-23 NOTE — H&P (Signed)
[name] is an 80 y.o. female.   Chief Complaint: Patient is here for EGD and APC of GAVE. 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 She has undergone 3 EGDs with therapeutic intervention with marginal benefit. Patient had routine blood work 1 month ago and serum iron drop from 47-23 and saturation was down to 6%. Patient has received 2 doses of Feraheme. Her stool has been positive but she has not experienced melena or rectal bleeding hematemesis. She denies abdominal pain. She has lost 30 pounds in the last 3 years. She lost over 20 pounds after knee replacement 3 years ago when she lost 6 pounds following back surgery 4 months ago. She says she loses her appetite after any surgery and it will last for several weeks.     Past Medical History  Diagnosis Date  . Hypertension   . Hypothyroidism   . Palpitations   . Anemia   . GAVE (gastric antral vascular ectasia) 09/12/12    Past Surgical History  Procedure Laterality Date  . Tonsillectomy and adenoidectomy    . US echocardiography  03/12/2006    EF 55-60%  . Knee replacement rt 3 yrs ago    . Back surger x 2    . Abdominal hysterectomy    . Shoulder surgery    . Colonoscopy  07/24/2012    Procedure: COLONOSCOPY;  Surgeon: Rogene Houston, MD;  [facility];  Service: Endoscopy;  Laterality: N/A;  200  . Heel spur surgery    . Esophagogastroduodenoscopy  09/12/2012    Procedure: ESOPHAGOGASTRODUODENOSCOPY (EGD);  Surgeon: Rogene Houston, MD;  [facility];  Service: Endoscopy;  Laterality: N/A;  325-changed to 200 per Ann-pt moved up to [place] to notify pt  . Total abdominal hysterectomy    . Esophagogastroduodenoscopy  11/01/2012    Procedure: ESOPHAGOGASTRODUODENOSCOPY (EGD);  Surgeon: Rogene Houston, MD;  [facility];  Service: Endoscopy;  Laterality: N/A;  1:20  . Hot  hemostasis  11/01/2012    Procedure: HOT HEMOSTASIS (ARGON PLASMA COAGULATION/BICAP);  Surgeon: Rogene Houston, MD;  [facility];  Service: Endoscopy;  Laterality: N/A;  . Esophagogastroduodenoscopy N/A 07/04/2013    Procedure: ESOPHAGOGASTRODUODENOSCOPY (EGD);  Surgeon: Rogene Houston, MD;  [facility];  Service: Endoscopy;  Laterality: N/A;  850  . Hot hemostasis N/A 07/04/2013    Procedure: HOT HEMOSTASIS (ARGON PLASMA COAGULATION/BICAP);  Surgeon: Rogene Houston, MD;  [facility];  Service: Endoscopy;  Laterality: N/A;  . Esophagogastroduodenoscopy N/A 08/06/2013    Procedure: ESOPHAGOGASTRODUODENOSCOPY (EGD);  Surgeon: Rogene Houston, MD;  [facility];  Service: Endoscopy;  Laterality: N/A;  1200  . Hot hemostasis N/A 08/06/2013    Procedure: HOT HEMOSTASIS (ARGON PLASMA COAGULATION/BICAP);  Surgeon: Rogene Houston, MD;  [facility];  Service: Endoscopy;  Laterality: N/A;  . Esophagogastroduodenoscopy N/A 10/31/2013    Procedure: ESOPHAGOGASTRODUODENOSCOPY (EGD);  Surgeon: Rogene Houston, MD;  [facility];  Service: Endoscopy;  Laterality: N/A;  125-moved to [place] notified pt  . Hot hemostasis N/A 10/31/2013    Procedure: HOT HEMOSTASIS (ARGON PLASMA COAGULATION/BICAP);  Surgeon: Rogene Houston, MD;  [facility];  Service: Endoscopy;  Laterality: N/A;  . Lumbar fusion  06/25/14    L4 & L5    Family History  Problem Relation Age of Onset  . Hypertension Mother   . Hypertension Father   . Heart attack Father    Social History:  reports that she has never smoked. She has never used smokeless tobacco. She reports that she does not drink alcohol or use illicit drugs.  Allergies:  Allergies  Allergen Reactions  . Avapro [Irbesartan] Other (See Comments)    Cough   . Clarithromycin Other (See Comments)    Unknown   . Losartan Potassium-Hctz Other (See Comments)    Unknown  . Maxzide  [Hydrochlorothiazide W-Triamterene] Other (See Comments)    Unknown   . Ziac [Bisoprolol-Hydrochlorothiazide]     Thinks caused itching and cough 08/12/12  . Penicillins Rash  . Sulfa Drugs Cross Reactors Rash    Medications Prior to Admission  Medication Sig Dispense Refill  . albuterol (PROVENTIL) (2.5 MG/3ML) 0.083% nebulizer solution Take 2.5 mg by nebulization every 6 (six) hours as needed for wheezing or shortness of breath.    Marland Kitchen amLODipine (NORVASC) 2.5 MG tablet TAKE 1 TABLET BY MOUTH EVERY DAY 90 tablet 3  . DIGOX 125 MCG tablet TAKE 1 TABLET BY MOUTH EVERY DAY AS DIRECTED, TAKE NO TABLET ON WEDNESDAY OR SUNDAY 30 tablet 6  . gabapentin (NEURONTIN) 300 MG capsule Take 300 mg by mouth 2 (two) times daily.    . hydrALAZINE (APRESOLINE) 10 MG tablet TAKE 1 TABLET BY MOUTH TWICE DAILY 180 tablet 1  . HYDROcodone-acetaminophen (NORCO/VICODIN) 5-325 MG per tablet Take 1 tablet by mouth every 6 (six) hours as needed for moderate pain.    Marland Kitchen levothyroxine (SYNTHROID, LEVOTHROID) 112 MCG tablet Take 112 mcg by mouth daily before breakfast.    . metoprolol tartrate (LOPRESSOR) 25 MG tablet TAKE 1 TABLET BY MOUTH EVERY DAY 90 tablet 1  . pantoprazole (PROTONIX) 40 MG tablet TAKE 1 TABLET BY MOUTH EVERY DAY 90 tablet 0  . polyethylene glycol powder (GLYCOLAX/MIRALAX) powder Take 17 g by mouth daily as needed for moderate constipation.       No results found for this or any previous visit (from the past 48 hour(s)). No results found.  ROS  Blood pressure 176/65, pulse 61, temperature 97.8 F (36.6 C), temperature source Oral, resp. rate 24, height 5\' 1"  (1.549 m), weight 154 lb (69.854 kg), SpO2 96 %. Physical Exam  Constitutional: She appears well-developed and well-nourished.  HENT:  Mouth/Throat: Oropharynx is clear and moist.  Eyes: Conjunctivae are normal. No scleral icterus.  Neck: No thyromegaly present.  Cardiovascular: Normal rate, regular rhythm and normal heart sounds.   No  murmur heard. Respiratory: Effort normal and breath sounds normal.  GI: Soft. She exhibits no distension and no mass. Tenderness:  mild midepigastric tenderness. There is no guarding.  Musculoskeletal: She exhibits no edema.  Lymphadenopathy:    She has no cervical adenopathy.  Neurological: She is alert.  Skin: Skin is warm and dry.     Assessment/Plan Recurrent iron deficiency anemia secondary to GI blood loss from gastric antral vascular ectasia. EGD with therapeutic intervention.  REHMAN,NAJEEB U 10/23/2014, 8:45 AM

## 2014-10-26 ENCOUNTER — Encounter (HOSPITAL_COMMUNITY): Payer: Self-pay | Admitting: Internal Medicine

## 2014-11-10 DIAGNOSIS — E039 Hypothyroidism, unspecified: Secondary | ICD-10-CM | POA: Diagnosis not present

## 2014-11-10 DIAGNOSIS — D509 Iron deficiency anemia, unspecified: Secondary | ICD-10-CM | POA: Diagnosis not present

## 2014-11-15 ENCOUNTER — Other Ambulatory Visit: Payer: Self-pay | Admitting: Cardiology

## 2014-12-02 DIAGNOSIS — M4316 Spondylolisthesis, lumbar region: Secondary | ICD-10-CM | POA: Diagnosis not present

## 2014-12-02 DIAGNOSIS — M4302 Spondylolysis, cervical region: Secondary | ICD-10-CM | POA: Diagnosis not present

## 2014-12-02 DIAGNOSIS — M47816 Spondylosis without myelopathy or radiculopathy, lumbar region: Secondary | ICD-10-CM | POA: Diagnosis not present

## 2014-12-02 DIAGNOSIS — G545 Neuralgic amyotrophy: Secondary | ICD-10-CM | POA: Diagnosis not present

## 2014-12-16 DIAGNOSIS — L570 Actinic keratosis: Secondary | ICD-10-CM | POA: Diagnosis not present

## 2014-12-16 DIAGNOSIS — Z85828 Personal history of other malignant neoplasm of skin: Secondary | ICD-10-CM | POA: Diagnosis not present

## 2014-12-16 DIAGNOSIS — L821 Other seborrheic keratosis: Secondary | ICD-10-CM | POA: Diagnosis not present

## 2014-12-24 ENCOUNTER — Encounter (HOSPITAL_COMMUNITY): Payer: Medicare Other | Attending: Oncology

## 2014-12-24 ENCOUNTER — Encounter (HOSPITAL_COMMUNITY): Payer: Self-pay

## 2014-12-24 ENCOUNTER — Encounter (HOSPITAL_BASED_OUTPATIENT_CLINIC_OR_DEPARTMENT_OTHER): Payer: Medicare Other

## 2014-12-24 DIAGNOSIS — D500 Iron deficiency anemia secondary to blood loss (chronic): Secondary | ICD-10-CM

## 2014-12-24 DIAGNOSIS — K31819 Angiodysplasia of stomach and duodenum without bleeding: Secondary | ICD-10-CM | POA: Diagnosis not present

## 2014-12-24 DIAGNOSIS — D509 Iron deficiency anemia, unspecified: Secondary | ICD-10-CM | POA: Insufficient documentation

## 2014-12-24 LAB — IRON AND TIBC
IRON: 25 ug/dL — AB (ref 42–145)
SATURATION RATIOS: 7 % — AB (ref 20–55)
TIBC: 357 ug/dL (ref 250–470)
UIBC: 332 ug/dL (ref 125–400)

## 2014-12-24 LAB — CBC WITH DIFFERENTIAL/PLATELET
BASOS ABS: 0.1 10*3/uL (ref 0.0–0.1)
Basophils Relative: 1 % (ref 0–1)
Eosinophils Absolute: 0.1 10*3/uL (ref 0.0–0.7)
Eosinophils Relative: 2 % (ref 0–5)
HEMATOCRIT: 38.7 % (ref 36.0–46.0)
HEMOGLOBIN: 12.3 g/dL (ref 12.0–15.0)
LYMPHS PCT: 11 % — AB (ref 12–46)
Lymphs Abs: 0.7 10*3/uL (ref 0.7–4.0)
MCH: 30.6 pg (ref 26.0–34.0)
MCHC: 31.8 g/dL (ref 30.0–36.0)
MCV: 96.3 fL (ref 78.0–100.0)
MONOS PCT: 13 % — AB (ref 3–12)
Monocytes Absolute: 0.9 10*3/uL (ref 0.1–1.0)
Neutro Abs: 5 10*3/uL (ref 1.7–7.7)
Neutrophils Relative %: 73 % (ref 43–77)
PLATELETS: 372 10*3/uL (ref 150–400)
RBC: 4.02 MIL/uL (ref 3.87–5.11)
RDW: 16 % — ABNORMAL HIGH (ref 11.5–15.5)
WBC: 6.8 10*3/uL (ref 4.0–10.5)

## 2014-12-24 LAB — FERRITIN: Ferritin: 17 ng/mL (ref 10–291)

## 2014-12-24 MED ORDER — SODIUM CHLORIDE 0.9 % IJ SOLN: 10.0000 mL | INTRAMUSCULAR | Status: DC | PRN

## 2014-12-24 MED ORDER — SODIUM CHLORIDE 0.9 % IV SOLN: Freq: Once | INTRAVENOUS | Status: DC

## 2014-12-24 MED ORDER — SODIUM CHLORIDE 0.9 % IV SOLN
510.0000 mg | Freq: Once | INTRAVENOUS | Status: AC
  Filled 2014-12-24: qty 17

## 2014-12-24 MED ORDER — SODIUM CHLORIDE 0.9 % IV SOLN
510.0000 mg | Freq: Once | INTRAVENOUS | Status: AC
  Administered 2014-12-24: 510 mg via INTRAVENOUS
  Filled 2014-12-24: qty 17

## 2014-12-24 MED ORDER — SODIUM CHLORIDE 0.9 % IV SOLN
INTRAVENOUS | Status: DC
  Administered 2014-12-24: 12:00:00 via INTRAVENOUS

## 2014-12-24 NOTE — Progress Notes (Signed)
[name] TOLERATED iron infusion well.  Discharged ambulatory.

## 2014-12-24 NOTE — Patient Instructions (Signed)
[place] at [facility]  Discharge Instructions:  You had an iron transfusion today.  Follow up as scheduled.  Call the clinic if you have any questions or concerns _______________________________________________________________  Thank you for choosing [place] at [facility] to provide your oncology and hematology care.  To afford each patient quality time with our providers, please arrive at least 15 minutes before your scheduled appointment.  You need to re-schedule your appointment if you arrive 10 or more minutes late.  We strive to give you quality time with our providers, and arriving late affects you and other patients whose appointments are after yours.  Also, if you no show three or more times for appointments you may be dismissed from the clinic.  Again, thank you for choosing [place] at [place] hope is that these requests will allow you access to exceptional care and in a timely manner. _______________________________________________________________  If you have questions after your visit, please contact our office at [phone] between the hours of 8:30 a.m. and 5:00 p.m. Voicemails left after 4:30 p.m. will not be returned until the following business day. _______________________________________________________________  For prescription refill requests, have your pharmacy contact our office. _______________________________________________________________  Recommendations made by the consultant and any test results will be sent to your referring physician. _______________________________________________________________

## 2014-12-24 NOTE — Progress Notes (Signed)
LABS FOR FERR,IRON/TIBC,CBCD 

## 2014-12-31 ENCOUNTER — Encounter (HOSPITAL_BASED_OUTPATIENT_CLINIC_OR_DEPARTMENT_OTHER): Payer: Medicare Other

## 2014-12-31 DIAGNOSIS — D500 Iron deficiency anemia secondary to blood loss (chronic): Secondary | ICD-10-CM

## 2014-12-31 DIAGNOSIS — K31819 Angiodysplasia of stomach and duodenum without bleeding: Secondary | ICD-10-CM

## 2014-12-31 DIAGNOSIS — D509 Iron deficiency anemia, unspecified: Secondary | ICD-10-CM | POA: Diagnosis not present

## 2014-12-31 MED ORDER — SODIUM CHLORIDE 0.9 % IJ SOLN
10.0000 mL | INTRAMUSCULAR | Status: DC | PRN
  Administered 2014-12-31: 10 mL
  Filled 2014-12-31: qty 10

## 2014-12-31 MED ORDER — SODIUM CHLORIDE 0.9 % IV SOLN
510.0000 mg | Freq: Once | INTRAVENOUS | Status: AC
  Administered 2014-12-31: 510 mg via INTRAVENOUS
  Filled 2014-12-31: qty 17

## 2014-12-31 MED ORDER — HEPARIN SOD (PORK) LOCK FLUSH 100 UNIT/ML IV SOLN: 500.0000 [IU] | Freq: Once | INTRAVENOUS | Status: DC | PRN

## 2014-12-31 MED ORDER — SODIUM CHLORIDE 0.9 % IV SOLN
Freq: Once | INTRAVENOUS | Status: AC
  Administered 2014-12-31: 11:00:00 via INTRAVENOUS

## 2014-12-31 NOTE — Progress Notes (Signed)
Tolerated iron infusion well. 

## 2014-12-31 NOTE — Patient Instructions (Signed)
[place] at [facility] Discharge Instructions  RECOMMENDATIONS MADE BY THE CONSULTANT AND ANY TEST RESULTS WILL BE SENT TO YOUR REFERRING PHYSICIAN.  Iron infusion #2 of 2 today. Return as scheduled.  Thank you for choosing [place] at [facility] to provide your oncology and hematology care.  To afford each patient quality time with our provider, please arrive at least 15 minutes before your scheduled appointment time.    You need to re-schedule your appointment should you arrive 10 or more minutes late.  We strive to give you quality time with our providers, and arriving late affects you and other patients whose appointments are after yours.  Also, if you no show three or more times for appointments you may be dismissed from the clinic at the providers discretion.     Again, thank you for choosing [facility].  Our hope is that these requests will decrease the amount of time that you wait before being seen by our physicians.       _____________________________________________________________  Should you have questions after your visit to [facility], please contact our office at [phone] between the hours of 8:30 a.m. and 4:30 p.m.  Voicemails left after 4:30 p.m. will not be returned until the following business day.  For prescription refill requests, have your pharmacy contact our office.

## 2015-01-05 ENCOUNTER — Ambulatory Visit (INDEPENDENT_AMBULATORY_CARE_PROVIDER_SITE_OTHER): Payer: Medicare Other | Admitting: Cardiology

## 2015-01-05 ENCOUNTER — Encounter: Payer: Self-pay | Admitting: Cardiology

## 2015-01-05 VITALS — BP 138/70 | HR 66 | Ht 61.0 in | Wt 152.0 lb

## 2015-01-05 DIAGNOSIS — D509 Iron deficiency anemia, unspecified: Secondary | ICD-10-CM | POA: Diagnosis not present

## 2015-01-05 DIAGNOSIS — R0602 Shortness of breath: Secondary | ICD-10-CM

## 2015-01-05 DIAGNOSIS — I119 Hypertensive heart disease without heart failure: Secondary | ICD-10-CM | POA: Diagnosis not present

## 2015-01-05 DIAGNOSIS — G4700 Insomnia, unspecified: Secondary | ICD-10-CM | POA: Diagnosis not present

## 2015-01-05 DIAGNOSIS — R002 Palpitations: Secondary | ICD-10-CM | POA: Diagnosis not present

## 2015-01-05 DIAGNOSIS — Z8249 Family history of ischemic heart disease and other diseases of the circulatory system: Secondary | ICD-10-CM | POA: Diagnosis not present

## 2015-01-05 MED ORDER — TEMAZEPAM 15 MG PO CAPS: 15.0000 mg | ORAL_CAPSULE | Freq: Every evening | ORAL | Status: DC | PRN

## 2015-01-05 NOTE — Progress Notes (Signed)
Cardiology Office Note   Date:  01/05/2015   ID:  Gem, Conkle [birth year], MRN [id number]  PCP:  [name], MD  Cardiologist:   Darlin Coco, MD   No chief complaint on file.     History of Present Illness: [name] is a 81 y.o. 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 She has a past history of angina hypertension and a history of frequent premature atrial beats. She had a nuclear stress test on 12/21/11 because of exertional dyspnea. She was found to have no ischemia and her ejection fraction was 83% by Myoview. She subsequently was found to have severe iron deficiency. She was evaluated by Dr. Abran Duke who gave her IV iron infusion and she felt better almost immediately. She had been experiencing itching of her skin which resolved and she also had been craving ice which resolved after the iron infusion. The patient had an echocardiogram on 09/24/12 which showed normal left ventricular systolic function with an ejection fraction of 55-60%. She had moderate pulmonary hypertension with a pulmonary artery pressure of 54 and she has had a subsequent sleep study and is being followed by pulmonary. She had an updated echocardiogram on 01/29/14 which showed an ejection fraction of 55-60% with grade 2 diastolic dysfunction and a pulmonary artery pressure of 47 which was down from a previous level of 54. There was mild aortic insufficiency. She is still using oxygen 2 L per minute at night but does not have to use it during the day when she is more active. Recently the patient had recurrent GI bleed. She has a diagnosis of GAVE.  She had an endoscopy in December 2015.  Her last hemoglobin was up to 12.4.  She always feels better after she gets her infusion of iron every 3 months. She is followed by hematology.  . Brother had an aneurysm of the descending aorta. Her sister had what sounds  like a aortic dissection of her descending thoracic aorta. The patient is concerned that she be checked for aneurysms herself.  For that reason we obtained a CT of the abdomen at her last visit which did not show any evidence of a abdominal aortic aneurysm.  She was relieved. In August 2015 she underwent successful back surgery by Dr. Glenna Fellows.  Her back feels very well now.   Past Medical History  Diagnosis Date  . Hypertension   . Hypothyroidism   . Palpitations   . Anemia   . GAVE (gastric antral vascular ectasia) 09/12/12  . On home O2     wears 2L/[place] at night    Past Surgical History  Procedure Laterality Date  . Tonsillectomy and adenoidectomy    . US echocardiography  03/12/2006    EF 55-60%  . Knee replacement rt 3 yrs ago    . Back surger x 2    . Abdominal hysterectomy    . Shoulder surgery    . Colonoscopy  07/24/2012    Procedure: COLONOSCOPY;  Surgeon: Rogene Houston, MD;  [facility];  Service: Endoscopy;  Laterality: N/A;  200  . Heel spur surgery    . Esophagogastroduodenoscopy  09/12/2012    Procedure: ESOPHAGOGASTRODUODENOSCOPY (EGD);  Surgeon: Rogene Houston, MD;  [facility];  Service: Endoscopy;  Laterality: N/A;  325-changed to 200 per Ann-pt moved up to [place] to notify pt  . Total  abdominal hysterectomy    . Esophagogastroduodenoscopy  11/01/2012    Procedure: ESOPHAGOGASTRODUODENOSCOPY (EGD);  Surgeon: Rogene Houston, MD;  [facility];  Service: Endoscopy;  Laterality: N/A;  1:20  . Hot hemostasis  11/01/2012    Procedure: HOT HEMOSTASIS (ARGON PLASMA COAGULATION/BICAP);  Surgeon: Rogene Houston, MD;  [facility];  Service: Endoscopy;  Laterality: N/A;  . Esophagogastroduodenoscopy N/A 07/04/2013    Procedure: ESOPHAGOGASTRODUODENOSCOPY (EGD);  Surgeon: Rogene Houston, MD;  [facility];  Service: Endoscopy;  Laterality: N/A;  850  . Hot hemostasis N/A 07/04/2013    Procedure: HOT HEMOSTASIS  (ARGON PLASMA COAGULATION/BICAP);  Surgeon: Rogene Houston, MD;  [facility];  Service: Endoscopy;  Laterality: N/A;  . Esophagogastroduodenoscopy N/A 08/06/2013    Procedure: ESOPHAGOGASTRODUODENOSCOPY (EGD);  Surgeon: Rogene Houston, MD;  [facility];  Service: Endoscopy;  Laterality: N/A;  1200  . Hot hemostasis N/A 08/06/2013    Procedure: HOT HEMOSTASIS (ARGON PLASMA COAGULATION/BICAP);  Surgeon: Rogene Houston, MD;  [facility];  Service: Endoscopy;  Laterality: N/A;  . Esophagogastroduodenoscopy N/A 10/31/2013    Procedure: ESOPHAGOGASTRODUODENOSCOPY (EGD);  Surgeon: Rogene Houston, MD;  [facility];  Service: Endoscopy;  Laterality: N/A;  125-moved to [place] notified pt  . Hot hemostasis N/A 10/31/2013    Procedure: HOT HEMOSTASIS (ARGON PLASMA COAGULATION/BICAP);  Surgeon: Rogene Houston, MD;  [facility];  Service: Endoscopy;  Laterality: N/A;  . Lumbar fusion  06/25/14    L4 & L5  . Esophagogastroduodenoscopy N/A 10/23/2014    Procedure: ESOPHAGOGASTRODUODENOSCOPY (EGD);  Surgeon: Rogene Houston, MD;  [facility];  Service: Endoscopy;  Laterality: N/A;  1020  . Hot hemostasis N/A 10/23/2014    Procedure: HOT HEMOSTASIS (ARGON PLASMA COAGULATION/BICAP);  Surgeon: Rogene Houston, MD;  [facility];  Service: Endoscopy;  Laterality: N/A;     Current Outpatient Prescriptions  Medication Sig Dispense Refill  . albuterol (PROVENTIL) (2.5 MG/3ML) 0.083% nebulizer solution Take 2.5 mg by nebulization every 6 (six) hours as needed for wheezing or shortness of breath.    Marland Kitchen amLODipine (NORVASC) 2.5 MG tablet TAKE 1 TABLET BY MOUTH EVERY DAY 90 tablet 3  . DIGOX 125 MCG tablet TAKE 1 TABLET BY MOUTH EVERY DAY AS DIRECTED, TAKE NO TABLET ON WEDNESDAY OR SUNDAY 30 tablet 6  . gabapentin (NEURONTIN) 300 MG capsule Take 300 mg by mouth 2 (two) times daily.    . hydrALAZINE (APRESOLINE) 10 MG tablet TAKE 1  TABLET BY MOUTH TWICE DAILY 180 tablet 1  . HYDROcodone-acetaminophen (NORCO/VICODIN) 5-325 MG per tablet Take 1 tablet by mouth every 6 (six) hours as needed for moderate pain.    Marland Kitchen levothyroxine (SYNTHROID, LEVOTHROID) 112 MCG tablet Take 112 mcg by mouth daily before breakfast.    . metoprolol tartrate (LOPRESSOR) 25 MG tablet TAKE 1 TABLET BY MOUTH EVERY DAY 90 tablet 1  . pantoprazole (PROTONIX) 40 MG tablet TAKE 1 TABLET BY MOUTH EVERY DAY 90 tablet 0  . polyethylene glycol powder (GLYCOLAX/MIRALAX) powder Take 17 g by mouth daily as needed for moderate constipation.     . sucralfate (CARAFATE) 1 GM/10ML suspension Take 10 mLs (1 g total) by mouth 4 (four) times daily -  with meals and at bedtime. 420 mL 0   No current facility-administered medications for this visit.    Allergies:   Avapro; Clarithromycin; Losartan potassium-hctz; Maxzide; Ziac; Penicillins; and  Sulfa drugs cross reactors    Social History:  The patient  reports that she has never smoked. She has never used smokeless tobacco. She reports that she does not drink alcohol or use illicit drugs.   Family History:  The patient's family history includes Heart attack in her father; Hypertension in her father and mother.    ROS:  Please see the history of present illness.   Otherwise, review of systems are positive for none.   All other systems are reviewed and negative.    PHYSICAL EXAM: VS:  BP 138/70 mmHg  Pulse 66  Ht 5\' 1"  (1.549 m)  Wt 152 lb (68.947 kg)  BMI 28.74 kg/m2 , BMI Body mass index is 28.74 kg/(m^2). GEN: Well nourished, well developed, in no acute distress HEENT: normal Neck: no JVD, carotid bruits, or masses Cardiac: RRR; no murmurs, rubs, or gallops,no edema  Respiratory:  clear to auscultation bilaterally, normal work of breathing GI: soft, nontender, nondistended, + BS MS: no deformity or atrophy Skin: warm and dry, no rash Neuro:  Strength and sensation are intact Psych: euthymic mood, full  affect   EKG:  EKG is ordered today. The ekg ordered today demonstrates normal sinus rhythm.  Within normal limits.  Recent Labs: 09/07/2014: ALT 15; BUN 16; Creatinine 1.1; Potassium 3.8; Sodium 138 12/24/2014: Hemoglobin 12.3; Platelets 372    Lipid Panel    Component Value Date/Time   CHOL 148 09/07/2014 1019   TRIG 95.0 09/07/2014 1019   HDL 44.90 09/07/2014 1019   CHOLHDL 3 09/07/2014 1019   VLDL 19.0 09/07/2014 1019   LDLCALC 84 09/07/2014 1019      Wt Readings from Last 3 Encounters:  01/05/15 152 lb (68.947 kg)  10/23/14 154 lb (69.854 kg)  09/23/14 154 lb (69.854 kg)         ASSESSMENT AND PLAN:  1. benign hypertensive heart disease without heart failure. 2. GAVE (gastric antral vascular ectasia) 3. chronic iron deficiency anemia secondary to gastric antral vascular ectasia. 4. pulmonary hypertension 5. stable dyspnea on exertion, multifactorial. 6. strong family history of thoracic aneurysm in brother and sister. Prominent abdominal aortic pulsation.  Plan: Continue on same medication. Recheck in 4 months for office visit    Current medicines are reviewed at length with the patient today.  The patient does not have concerns regarding medicines.  The following changes have been made:  no change  Labs/ tests ordered today include:  No orders of the defined types were placed in this encounter.     Disposition:   FU with Dr. Mare Ferrari  in 4 months for office visit. We refilled her generic Restoril 15 mg at bedtime when necessary today   Signed, Darlin Coco, MD  01/05/2015 5:48 PM    [facility] [address], [place], [place]  [address] Phone: [phone]; Fax: [phone]

## 2015-01-05 NOTE — Patient Instructions (Signed)
Restoril (Temezapam) 15 mg 1 at bedtime as needed  Your physician recommends that you schedule a follow-up appointment in: 4 month ov

## 2015-01-22 ENCOUNTER — Encounter (HOSPITAL_COMMUNITY): Payer: Self-pay | Admitting: Oncology

## 2015-01-22 ENCOUNTER — Encounter (HOSPITAL_COMMUNITY): Payer: Medicare Other | Attending: Oncology | Admitting: Oncology

## 2015-01-22 VITALS — BP 132/69 | HR 65 | Temp 97.7°F | Resp 18 | Wt 154.1 lb

## 2015-01-22 DIAGNOSIS — D500 Iron deficiency anemia secondary to blood loss (chronic): Secondary | ICD-10-CM | POA: Diagnosis not present

## 2015-01-22 DIAGNOSIS — D509 Iron deficiency anemia, unspecified: Secondary | ICD-10-CM | POA: Insufficient documentation

## 2015-01-22 DIAGNOSIS — K31819 Angiodysplasia of stomach and duodenum without bleeding: Secondary | ICD-10-CM

## 2015-01-22 NOTE — Assessment & Plan Note (Signed)
S/P EGD by Dr. Daisey Must in Dec 2015.  He notes:  Extensive gastric antral Vassar ectasia along with few at cardia. Majority of these lesions were ablated with argon plasma coagulation.

## 2015-01-22 NOTE — Assessment & Plan Note (Signed)
Secondary to GAVE Syndrome.  She is stable with IV Feraheme 510 mg on days 1 and 8 every 12 weeks.  Will continue to monitor labs and continue with supportive therapy plan as developed.  Supportive therapy plan reviewed.  Labs every 12 weeks with IV Feraheme every 12 weeks.  No need to wait on ferritin level prior to IV Feraheme given historical results of ferritin in the teens every 12 weeks.  Will hold IV Feraheme is ferritin is noted to be 350 or higher.  Return in 6 months for follow-up.

## 2015-01-22 NOTE — Progress Notes (Signed)
Mary Cahill, MD  [place] [address]  Iron deficiency anemia due to chronic blood loss  GAVE (gastric antral vascular ectasia)  CURRENT THERAPY: IV Feraheme 510 mg on days 1 and 8 every 12 weeks.  INTERVAL HISTORY: [name] 81 y.o. 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  I personally reviewed and went over laboratory results with the patient.  The results are noted within this dictation.   Chart reviewed.  She underwent EGD by Dr. Laural Golden on 10/23/2014 and he reports: Extensive gastric antral Vassar ectasia along with few at cardia. Majority of these lesions were ablated with argon plasma coagulation.  I provided her education regarding iron deficiency anemia secondary to GAVE Syndrome.  Current supportive therapy plan is working and maintaining her iron stores and Hgb.    Hematologically., she denies any complaints and ROS questioning is negative.   Past Medical History  Diagnosis Date  . Hypertension   . Hypothyroidism   . Palpitations   . Anemia   . GAVE (gastric antral vascular ectasia) 09/12/12  . On home O2     wears 2L/[place] at night  . Iron deficiency anemia 10/15/2012  . Iron deficiency anemia due to chronic blood loss 04/20/2014    has Benign hypertensive heart disease without heart failure; Palpitations; Hypothyroidism; GERD (gastroesophageal reflux disease); Pruritus; Pulmonary hypertension; Pulmonary nodule; Chronic respiratory failure; Constipation; Acute posthemorrhagic anemia; GAVE (gastric antral vascular ectasia); Iron deficiency anemia due to chronic blood loss; and Family history of aortic aneurysm on her problem list.     is allergic to avapro; clarithromycin; losartan potassium-hctz; maxzide; ziac; penicillins; and sulfa drugs cross reactors.  [name] does not currently have medications on file.  Past Surgical  History  Procedure Laterality Date  . Tonsillectomy and adenoidectomy    . US echocardiography  03/12/2006    EF 55-60%  . Knee replacement rt 3 yrs ago    . Back surger x 2    . Abdominal hysterectomy    . Shoulder surgery    . Colonoscopy  07/24/2012    Procedure: COLONOSCOPY;  Surgeon: Rogene Houston, MD;  [facility];  Service: Endoscopy;  Laterality: N/A;  200  . Heel spur surgery    . Esophagogastroduodenoscopy  09/12/2012    Procedure: ESOPHAGOGASTRODUODENOSCOPY (EGD);  Surgeon: Rogene Houston, MD;  [facility];  Service: Endoscopy;  Laterality: N/A;  325-changed to 200 per Ann-pt moved up to [place] to notify pt  . Total abdominal hysterectomy    . Esophagogastroduodenoscopy  11/01/2012    Procedure: ESOPHAGOGASTRODUODENOSCOPY (EGD);  Surgeon: Rogene Houston, MD;  [facility];  Service: Endoscopy;  Laterality: N/A;  1:20  . Hot hemostasis  11/01/2012    Procedure: HOT HEMOSTASIS (ARGON PLASMA COAGULATION/BICAP);  Surgeon: Rogene Houston, MD;  [facility];  Service: Endoscopy;  Laterality: N/A;  . Esophagogastroduodenoscopy N/A 07/04/2013    Procedure: ESOPHAGOGASTRODUODENOSCOPY (EGD);  Surgeon: Rogene Houston, MD;  [facility];  Service: Endoscopy;  Laterality: N/A;  850  . Hot hemostasis N/A 07/04/2013    Procedure: HOT HEMOSTASIS (ARGON PLASMA COAGULATION/BICAP);  Surgeon: Rogene Houston, MD;  [facility];  Service: Endoscopy;  Laterality: N/A;  . Esophagogastroduodenoscopy N/A 08/06/2013    Procedure: ESOPHAGOGASTRODUODENOSCOPY (EGD);  Surgeon: Rogene Houston, MD;  [facility];  Service: Endoscopy;  Laterality: N/A;  1200  . Hot hemostasis N/A 08/06/2013    Procedure: HOT HEMOSTASIS (ARGON PLASMA COAGULATION/BICAP);  Surgeon: Rogene Houston, MD;  [facility];  Service: Endoscopy;  Laterality: N/A;  . Esophagogastroduodenoscopy N/A 10/31/2013    Procedure:  ESOPHAGOGASTRODUODENOSCOPY (EGD);  Surgeon: Rogene Houston, MD;  [facility];  Service: Endoscopy;  Laterality: N/A;  125-moved to [place] notified pt  . Hot hemostasis N/A 10/31/2013    Procedure: HOT HEMOSTASIS (ARGON PLASMA COAGULATION/BICAP);  Surgeon: Rogene Houston, MD;  [facility];  Service: Endoscopy;  Laterality: N/A;  . Lumbar fusion  06/25/14    L4 & L5  . Esophagogastroduodenoscopy N/A 10/23/2014    Procedure: ESOPHAGOGASTRODUODENOSCOPY (EGD);  Surgeon: Rogene Houston, MD;  [facility];  Service: Endoscopy;  Laterality: N/A;  1020  . Hot hemostasis N/A 10/23/2014    Procedure: HOT HEMOSTASIS (ARGON PLASMA COAGULATION/BICAP);  Surgeon: Rogene Houston, MD;  [facility];  Service: Endoscopy;  Laterality: N/A;    Denies any headaches, dizziness, double vision, fevers, chills, night sweats, nausea, vomiting, diarrhea, constipation, chest pain, heart palpitations, shortness of breath, blood in stool, black tarry stool, urinary pain, urinary burning, urinary frequency, hematuria.   PHYSICAL EXAMINATION  ECOG PERFORMANCE STATUS: 0 - Asymptomatic  There were no vitals filed for this visit.  GENERAL:alert, no distress, well nourished, well developed, comfortable, cooperative and smiling SKIN: skin color, texture, turgor are normal, no rashes or significant lesions HEAD: Normocephalic, No masses, lesions, tenderness or abnormalities EYES: normal, PERRLA, EOMI, Conjunctiva are pink and non-injected EARS: External ears normal OROPHARYNX:lips, buccal mucosa, and tongue normal and mucous membranes are moist  NECK: supple, no adenopathy, thyroid normal size, non-tender, without nodularity, no stridor, non-tender, trachea midline LYMPH:  no palpable lymphadenopathy BREAST:not examined LUNGS: clear to auscultation and percussion HEART: regular rate & rhythm, no murmurs, no gallops, S1 normal and S2 normal ABDOMEN:abdomen soft, non-tender  and normal bowel sounds BACK: Back symmetric, no curvature., No CVA tenderness EXTREMITIES:less then 2 second capillary refill, no joint deformities, effusion, or inflammation, no edema, no skin discoloration, no clubbing, no cyanosis  NEURO: alert & oriented x 3 with fluent speech, no focal motor/sensory deficits, gait normal   LABORATORY DATA: CBC    Component Value Date/Time   WBC 6.8 12/24/2014 0955   RBC 4.02 12/24/2014 0955   RBC 4.12 08/20/2012 1725   HGB 12.3 12/24/2014 0955   HCT 38.7 12/24/2014 0955   PLT 372 12/24/2014 0955   MCV 96.3 12/24/2014 0955   MCH 30.6 12/24/2014 0955   MCHC 31.8 12/24/2014 0955   RDW 16.0* 12/24/2014 0955   LYMPHSABS 0.7 12/24/2014 0955   MONOABS 0.9 12/24/2014 0955   EOSABS 0.1 12/24/2014 0955   BASOSABS 0.1 12/24/2014 0955      Chemistry      Component Value Date/Time   NA 138 09/07/2014 1019   K 3.8 09/07/2014 1019   CL 104 09/07/2014 1019   CO2 29 09/07/2014 1019   BUN 16 09/07/2014 1019   CREATININE 1.1 09/07/2014 1019      Component Value Date/Time   CALCIUM 8.8 09/07/2014 1019   ALKPHOS 95 09/07/2014 1019   AST 18 09/07/2014 1019   ALT 15 09/07/2014 1019   BILITOT 0.4 09/07/2014 1019     Lab Results  Component Value Date   IRON 25* 12/24/2014   TIBC 357 12/24/2014   FERRITIN  17 12/24/2014     ASSESSMENT AND PLAN:  Iron deficiency anemia due to chronic blood loss Secondary to GAVE Syndrome.  She is stable with IV Feraheme 510 mg on days 1 and 8 every 12 weeks.  Will continue to monitor labs and continue with supportive therapy plan as developed.  Supportive therapy plan reviewed.  Labs every 12 weeks with IV Feraheme every 12 weeks.  No need to wait on ferritin level prior to IV Feraheme given historical results of ferritin in the teens every 12 weeks.  Will hold IV Feraheme is ferritin is noted to be 350 or higher.  Return in 6 months for follow-up.   GAVE (gastric antral vascular ectasia) S/P EGD by Dr. Daisey Must in  Dec 2015.  He notes:  Extensive gastric antral Vassar ectasia along with few at cardia. Majority of these lesions were ablated with argon plasma coagulation.     THERAPY PLAN:  Continue with supportive therapy plan as ordered with labs every 12 weeks.   All questions were answered. The patient knows to call the clinic with any problems, questions or concerns. We can certainly see the patient much sooner if necessary.  Patient and plan discussed with Dr. Ancil Linsey and she is in agreement with the aforementioned.   This note is electronically signed by: Robynn Pane 01/22/2015 8:10 AM

## 2015-01-22 NOTE — Patient Instructions (Signed)
[place] at [facility]  Discharge Instructions:  You saw Kirby Crigler PA-C today.  Follow up as scheduled for labs and iron infusions.  Follow up with the doctor in 6 months.  If you have any questions or concerns please call the clinic. _______________________________________________________________  Thank you for choosing [place] at [facility] to provide your oncology and hematology care.  To afford each patient quality time with our providers, please arrive at least 15 minutes before your scheduled appointment.  You need to re-schedule your appointment if you arrive 10 or more minutes late.  We strive to give you quality time with our providers, and arriving late affects you and other patients whose appointments are after yours.  Also, if you no show three or more times for appointments you may be dismissed from the clinic.  Again, thank you for choosing [place] at [facility] hope is that these requests will allow you access to exceptional care and in a timely manner. _______________________________________________________________  If you have questions after your visit, please contact our office at [phone] between the hours of 8:30 a.m. and 5:00 p.m. Voicemails left after 4:30 p.m. will not be returned until the following business day. _______________________________________________________________  For prescription refill requests, have your pharmacy contact our office. _______________________________________________________________  Recommendations made by the consultant and any test results will be sent to your referring physician. _______________________________________________________________

## 2015-02-01 DIAGNOSIS — H52223 Regular astigmatism, bilateral: Secondary | ICD-10-CM | POA: Diagnosis not present

## 2015-02-01 DIAGNOSIS — Z961 Presence of intraocular lens: Secondary | ICD-10-CM | POA: Diagnosis not present

## 2015-02-01 DIAGNOSIS — H5202 Hypermetropia, left eye: Secondary | ICD-10-CM | POA: Diagnosis not present

## 2015-02-01 DIAGNOSIS — H524 Presbyopia: Secondary | ICD-10-CM | POA: Diagnosis not present

## 2015-02-03 DIAGNOSIS — M79644 Pain in right finger(s): Secondary | ICD-10-CM | POA: Diagnosis not present

## 2015-03-04 ENCOUNTER — Encounter (HOSPITAL_COMMUNITY): Payer: Medicare Other | Attending: Oncology

## 2015-03-04 ENCOUNTER — Other Ambulatory Visit (HOSPITAL_COMMUNITY): Payer: Self-pay | Admitting: Oncology

## 2015-03-04 DIAGNOSIS — D509 Iron deficiency anemia, unspecified: Secondary | ICD-10-CM | POA: Diagnosis not present

## 2015-03-04 DIAGNOSIS — D500 Iron deficiency anemia secondary to blood loss (chronic): Secondary | ICD-10-CM

## 2015-03-04 DIAGNOSIS — K31819 Angiodysplasia of stomach and duodenum without bleeding: Secondary | ICD-10-CM | POA: Insufficient documentation

## 2015-03-04 LAB — CBC WITH DIFFERENTIAL/PLATELET
Basophils Absolute: 0.1 10*3/uL (ref 0.0–0.1)
Basophils Relative: 1 % (ref 0–1)
EOS PCT: 1 % (ref 0–5)
Eosinophils Absolute: 0.1 10*3/uL (ref 0.0–0.7)
HCT: 43.6 % (ref 36.0–46.0)
Hemoglobin: 14.3 g/dL (ref 12.0–15.0)
Lymphocytes Relative: 12 % (ref 12–46)
Lymphs Abs: 0.9 10*3/uL (ref 0.7–4.0)
MCH: 32.3 pg (ref 26.0–34.0)
MCHC: 32.8 g/dL (ref 30.0–36.0)
MCV: 98.4 fL (ref 78.0–100.0)
MONO ABS: 1.1 10*3/uL — AB (ref 0.1–1.0)
Monocytes Relative: 15 % — ABNORMAL HIGH (ref 3–12)
NEUTROS PCT: 71 % (ref 43–77)
Neutro Abs: 5.3 10*3/uL (ref 1.7–7.7)
PLATELETS: 276 10*3/uL (ref 150–400)
RBC: 4.43 MIL/uL (ref 3.87–5.11)
RDW: 14.4 % (ref 11.5–15.5)
WBC: 7.3 10*3/uL (ref 4.0–10.5)

## 2015-03-04 LAB — IRON AND TIBC
IRON: 31 ug/dL — AB (ref 42–145)
SATURATION RATIOS: 10 % — AB (ref 20–55)
TIBC: 320 ug/dL (ref 250–470)
UIBC: 289 ug/dL (ref 125–400)

## 2015-03-04 LAB — FERRITIN: Ferritin: 39 ng/mL (ref 10–291)

## 2015-03-08 ENCOUNTER — Other Ambulatory Visit (HOSPITAL_COMMUNITY): Payer: Self-pay | Admitting: Oncology

## 2015-03-08 DIAGNOSIS — D500 Iron deficiency anemia secondary to blood loss (chronic): Secondary | ICD-10-CM

## 2015-03-08 DIAGNOSIS — K31819 Angiodysplasia of stomach and duodenum without bleeding: Secondary | ICD-10-CM

## 2015-03-10 ENCOUNTER — Encounter (HOSPITAL_BASED_OUTPATIENT_CLINIC_OR_DEPARTMENT_OTHER): Payer: Medicare Other

## 2015-03-10 ENCOUNTER — Encounter (HOSPITAL_COMMUNITY): Payer: Self-pay

## 2015-03-10 VITALS — BP 148/58 | HR 66 | Temp 97.8°F | Resp 18

## 2015-03-10 DIAGNOSIS — D500 Iron deficiency anemia secondary to blood loss (chronic): Secondary | ICD-10-CM | POA: Diagnosis present

## 2015-03-10 DIAGNOSIS — K31819 Angiodysplasia of stomach and duodenum without bleeding: Secondary | ICD-10-CM

## 2015-03-10 DIAGNOSIS — D509 Iron deficiency anemia, unspecified: Secondary | ICD-10-CM

## 2015-03-10 MED ORDER — SODIUM CHLORIDE 0.9 % IJ SOLN: 10.0000 mL | INTRAMUSCULAR | Status: DC | PRN

## 2015-03-10 MED ORDER — HEPARIN SOD (PORK) LOCK FLUSH 100 UNIT/ML IV SOLN: 500.0000 [IU] | Freq: Once | INTRAVENOUS | Status: DC | PRN

## 2015-03-10 MED ORDER — SODIUM CHLORIDE 0.9 % IV SOLN
510.0000 mg | Freq: Once | INTRAVENOUS | Status: AC
  Administered 2015-03-10: 510 mg via INTRAVENOUS
  Filled 2015-03-10: qty 17

## 2015-03-10 MED ORDER — HEPARIN SOD (PORK) LOCK FLUSH 100 UNIT/ML IV SOLN: 250.0000 [IU] | Freq: Once | INTRAVENOUS | Status: DC | PRN

## 2015-03-10 MED ORDER — SODIUM CHLORIDE 0.9 % IV SOLN
Freq: Once | INTRAVENOUS | Status: AC
Start: 2015-03-10 — End: 2015-03-10
  Administered 2015-03-10: 15:00:00 via INTRAVENOUS

## 2015-03-10 MED ORDER — SODIUM CHLORIDE 0.9 % IJ SOLN: 3.0000 mL | Freq: Once | INTRAMUSCULAR | Status: DC | PRN

## 2015-03-10 MED ORDER — ALTEPLASE 2 MG IJ SOLR: 2.0000 mg | Freq: Once | INTRAMUSCULAR | Status: DC | PRN

## 2015-03-10 NOTE — Progress Notes (Signed)
Tolerated iron infusion well. 

## 2015-03-10 NOTE — Patient Instructions (Signed)
[place] at [facility] Discharge Instructions  RECOMMENDATIONS MADE BY THE CONSULTANT AND ANY TEST RESULTS WILL BE SENT TO YOUR REFERRING PHYSICIAN.  Iron infusion today. Return as scheduled for lab work, office visit, and infusions.   Thank you for choosing [place] at [facility] to provide your oncology and hematology care.  To afford each patient quality time with our provider, please arrive at least 15 minutes before your scheduled appointment time.    You need to re-schedule your appointment should you arrive 10 or more minutes late.  We strive to give you quality time with our providers, and arriving late affects you and other patients whose appointments are after yours.  Also, if you no show three or more times for appointments you may be dismissed from the clinic at the providers discretion.     Again, thank you for choosing [facility].  Our hope is that these requests will decrease the amount of time that you wait before being seen by our physicians.       _____________________________________________________________  Should you have questions after your visit to [facility], please contact our office at [phone] between the hours of 8:30 a.m. and 4:30 p.m.  Voicemails left after 4:30 p.m. will not be returned until the following business day.  For prescription refill requests, have your pharmacy contact our office.

## 2015-03-12 ENCOUNTER — Other Ambulatory Visit: Payer: Self-pay

## 2015-03-12 DIAGNOSIS — Z1231 Encounter for screening mammogram for malignant neoplasm of breast: Secondary | ICD-10-CM

## 2015-03-16 ENCOUNTER — Other Ambulatory Visit (HOSPITAL_COMMUNITY): Payer: PRIVATE HEALTH INSURANCE

## 2015-03-17 ENCOUNTER — Other Ambulatory Visit (HOSPITAL_COMMUNITY): Payer: PRIVATE HEALTH INSURANCE

## 2015-03-19 ENCOUNTER — Ambulatory Visit (HOSPITAL_COMMUNITY): Payer: PRIVATE HEALTH INSURANCE

## 2015-03-26 ENCOUNTER — Ambulatory Visit (HOSPITAL_COMMUNITY): Payer: PRIVATE HEALTH INSURANCE

## 2015-03-29 ENCOUNTER — Ambulatory Visit
Admission: RE | Admit: 2015-03-29 | Discharge: 2015-03-29 | Disposition: A | Discharge status: Home / Self Care | Payer: Medicare Other | Source: Ambulatory Visit

## 2015-03-29 DIAGNOSIS — Z1231 Encounter for screening mammogram for malignant neoplasm of breast: Secondary | ICD-10-CM | POA: Diagnosis not present

## 2015-03-30 DIAGNOSIS — G545 Neuralgic amyotrophy: Secondary | ICD-10-CM | POA: Diagnosis not present

## 2015-03-30 DIAGNOSIS — M47816 Spondylosis without myelopathy or radiculopathy, lumbar region: Secondary | ICD-10-CM | POA: Diagnosis not present

## 2015-03-30 DIAGNOSIS — M4316 Spondylolisthesis, lumbar region: Secondary | ICD-10-CM | POA: Diagnosis not present

## 2015-04-07 ENCOUNTER — Other Ambulatory Visit: Payer: Self-pay | Admitting: Cardiology

## 2015-04-21 ENCOUNTER — Encounter: Payer: Self-pay | Admitting: Cardiology

## 2015-04-21 ENCOUNTER — Ambulatory Visit (INDEPENDENT_AMBULATORY_CARE_PROVIDER_SITE_OTHER): Payer: Medicare Other | Admitting: Cardiology

## 2015-04-21 VITALS — BP 150/70 | HR 69 | Ht 61.0 in | Wt 146.8 lb

## 2015-04-21 DIAGNOSIS — D509 Iron deficiency anemia, unspecified: Secondary | ICD-10-CM | POA: Diagnosis not present

## 2015-04-21 DIAGNOSIS — D500 Iron deficiency anemia secondary to blood loss (chronic): Secondary | ICD-10-CM

## 2015-04-21 DIAGNOSIS — I119 Hypertensive heart disease without heart failure: Secondary | ICD-10-CM | POA: Diagnosis not present

## 2015-04-21 DIAGNOSIS — R634 Abnormal weight loss: Secondary | ICD-10-CM | POA: Diagnosis not present

## 2015-04-21 LAB — LIPID PANEL
CHOL/HDL RATIO: 3
Cholesterol: 144 mg/dL (ref 0–200)
HDL: 45.4 mg/dL (ref >39.00)
LDL Cholesterol: 82 mg/dL (ref 0–99)
NONHDL: 98.6
Triglycerides: 83 mg/dL (ref 0.0–149.0)
VLDL: 16.6 mg/dL (ref 0.0–40.0)

## 2015-04-21 LAB — CBC WITH DIFFERENTIAL/PLATELET
Basophils Absolute: 0.1 10*3/uL (ref 0.0–0.1)
Basophils Relative: 1 % (ref 0.0–3.0)
EOS ABS: 0.2 10*3/uL (ref 0.0–0.7)
EOS PCT: 3.3 % (ref 0.0–5.0)
HCT: 40.5 % (ref 36.0–46.0)
HEMOGLOBIN: 13.3 g/dL (ref 12.0–15.0)
Lymphocytes Relative: 16.5 % (ref 12.0–46.0)
Lymphs Abs: 1.1 10*3/uL (ref 0.7–4.0)
MCHC: 32.9 g/dL (ref 30.0–36.0)
MCV: 98.9 fl (ref 78.0–100.0)
MONO ABS: 0.9 10*3/uL (ref 0.1–1.0)
MONOS PCT: 14.1 % — AB (ref 3.0–12.0)
NEUTROS PCT: 65.1 % (ref 43.0–77.0)
Neutro Abs: 4.2 10*3/uL (ref 1.4–7.7)
Platelets: 316 10*3/uL (ref 150.0–400.0)
RBC: 4.1 Mil/uL (ref 3.87–5.11)
RDW: 15.3 % (ref 11.5–15.5)
WBC: 6.4 10*3/uL (ref 4.0–10.5)

## 2015-04-21 LAB — HEPATIC FUNCTION PANEL
ALT: 15 U/L (ref 0–35)
AST: 21 U/L (ref 0–37)
Albumin: 3.6 g/dL (ref 3.5–5.2)
Alkaline Phosphatase: 84 U/L (ref 39–117)
BILIRUBIN DIRECT: 0.1 mg/dL (ref 0.0–0.3)
BILIRUBIN TOTAL: 0.5 mg/dL (ref 0.2–1.2)
Total Protein: 6.5 g/dL (ref 6.0–8.3)

## 2015-04-21 LAB — BASIC METABOLIC PANEL
BUN: 20 mg/dL (ref 6–23)
CALCIUM: 9.3 mg/dL (ref 8.4–10.5)
CO2: 30 meq/L (ref 19–32)
Chloride: 104 mEq/L (ref 96–112)
Creatinine, Ser: 1.04 mg/dL (ref 0.40–1.20)
GFR: 54.2 mL/min — AB (ref >60.00)
Glucose, Bld: 87 mg/dL (ref 70–99)
Potassium: 4 mEq/L (ref 3.5–5.1)
Sodium: 137 mEq/L (ref 135–145)

## 2015-04-21 MED ORDER — HYDRALAZINE HCL 10 MG PO TABS: 10.0000 mg | ORAL_TABLET | Freq: Three times a day (TID) | ORAL | Status: DC

## 2015-04-21 NOTE — Progress Notes (Signed)
Cardiology Office Note   Date:  04/21/2015   ID:  [name], DOB [birth year], MRN [id number]  PCP:  [name], MD  Cardiologist: Darlin Coco MD  No chief complaint on file.     History of Present Illness: [name] is a 81 y.o. female who presents for scheduled follow-up office visit.  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 She has a past history of angina hypertension and a history of frequent premature atrial beats. She had a nuclear stress test on 12/21/11 because of exertional dyspnea. She was found to have no ischemia and her ejection fraction was 83% by Myoview. She subsequently was found to have severe iron deficiency. She was evaluated by Dr. Abran Duke who gave her IV iron infusion and she felt better almost immediately. She had been experiencing itching of her skin which resolved and she also had been craving ice which resolved after the iron infusion. The patient had an echocardiogram on 09/24/12 which showed normal left ventricular systolic function with an ejection fraction of 55-60%. She had moderate pulmonary hypertension with a pulmonary artery pressure of 54 and she has had a subsequent sleep study and is being followed by pulmonary. She had an updated echocardiogram on 01/29/14 which showed an ejection fraction of 55-60% with grade 2 diastolic dysfunction and a pulmonary artery pressure of 47 which was down from a previous level of 54. There was mild aortic insufficiency. She is still using oxygen 2 L per minute at night but does not have to use it during the day when she is more active. Recently the patient had recurrent GI bleed. She has a diagnosis of GAVE. She had an endoscopy in December 2015. Her last hemoglobin was up to 14.3. She always feels better after she gets her infusion of iron every 3 months. She is followed by hematology.  . Brother had an aneurysm of the descending aorta. Her sister had what sounds like a  aortic dissection of her descending thoracic aorta. The patient is concerned that she be checked for aneurysms herself. For that reason we obtained a CT of the abdomen at her last visit which did not show any evidence of a abdominal aortic aneurysm. She was relieved. In August 2015 she underwent successful back surgery by Dr. Glenna Fellows. Her back feels very well now.  Since last visit she has not been having any new cardiac symptoms.  She has not felt any skipping of her heart recently.  She states she feels the best she has felt in 3 years.  She is still getting good relief from the previous back surgery.  She is sleeping better at night.  She still uses oxygen at night as per instructions from pulmonary for her pulmonary hypertension.. Her appetite is fair.  She has lost 8 pounds since last visit.  She is not trying to lose weight.  Past Medical History  Diagnosis Date  . Hypertension   . Hypothyroidism   . Palpitations   . Anemia   . GAVE (gastric antral vascular ectasia) 09/12/12  . On home O2     wears 2L/[place] at night  . Iron deficiency anemia 10/15/2012  . Iron deficiency anemia due to chronic blood loss 04/20/2014    Past Surgical History  Procedure Laterality Date  . Tonsillectomy and adenoidectomy    . US echocardiography  03/12/2006    EF 55-60%  . Knee replacement rt 3 yrs ago    .  Back surger x 2    . Abdominal hysterectomy    . Shoulder surgery    . Colonoscopy  07/24/2012    Procedure: COLONOSCOPY;  Surgeon: Rogene Houston, MD;  [facility];  Service: Endoscopy;  Laterality: N/A;  200  . Heel spur surgery    . Esophagogastroduodenoscopy  09/12/2012    Procedure: ESOPHAGOGASTRODUODENOSCOPY (EGD);  Surgeon: Rogene Houston, MD;  [facility];  Service: Endoscopy;  Laterality: N/A;  325-changed to 200 per Ann-pt moved up to [place] to notify pt  . Total abdominal hysterectomy    . Esophagogastroduodenoscopy  11/01/2012    Procedure:  ESOPHAGOGASTRODUODENOSCOPY (EGD);  Surgeon: Rogene Houston, MD;  [facility];  Service: Endoscopy;  Laterality: N/A;  1:20  . Hot hemostasis  11/01/2012    Procedure: HOT HEMOSTASIS (ARGON PLASMA COAGULATION/BICAP);  Surgeon: Rogene Houston, MD;  [facility];  Service: Endoscopy;  Laterality: N/A;  . Esophagogastroduodenoscopy N/A 07/04/2013    Procedure: ESOPHAGOGASTRODUODENOSCOPY (EGD);  Surgeon: Rogene Houston, MD;  [facility];  Service: Endoscopy;  Laterality: N/A;  850  . Hot hemostasis N/A 07/04/2013    Procedure: HOT HEMOSTASIS (ARGON PLASMA COAGULATION/BICAP);  Surgeon: Rogene Houston, MD;  [facility];  Service: Endoscopy;  Laterality: N/A;  . Esophagogastroduodenoscopy N/A 08/06/2013    Procedure: ESOPHAGOGASTRODUODENOSCOPY (EGD);  Surgeon: Rogene Houston, MD;  [facility];  Service: Endoscopy;  Laterality: N/A;  1200  . Hot hemostasis N/A 08/06/2013    Procedure: HOT HEMOSTASIS (ARGON PLASMA COAGULATION/BICAP);  Surgeon: Rogene Houston, MD;  [facility];  Service: Endoscopy;  Laterality: N/A;  . Esophagogastroduodenoscopy N/A 10/31/2013    Procedure: ESOPHAGOGASTRODUODENOSCOPY (EGD);  Surgeon: Rogene Houston, MD;  [facility];  Service: Endoscopy;  Laterality: N/A;  125-moved to [place] notified pt  . Hot hemostasis N/A 10/31/2013    Procedure: HOT HEMOSTASIS (ARGON PLASMA COAGULATION/BICAP);  Surgeon: Rogene Houston, MD;  [facility];  Service: Endoscopy;  Laterality: N/A;  . Lumbar fusion  06/25/14    L4 & L5  . Esophagogastroduodenoscopy N/A 10/23/2014    Procedure: ESOPHAGOGASTRODUODENOSCOPY (EGD);  Surgeon: Rogene Houston, MD;  [facility];  Service: Endoscopy;  Laterality: N/A;  1020  . Hot hemostasis N/A 10/23/2014    Procedure: HOT HEMOSTASIS (ARGON PLASMA COAGULATION/BICAP);  Surgeon: Rogene Houston, MD;  [facility];  Service: Endoscopy;  Laterality:  N/A;     Current Outpatient Prescriptions  Medication Sig Dispense Refill  . albuterol (PROVENTIL) (2.5 MG/3ML) 0.083% nebulizer solution Take 2.5 mg by nebulization every 6 (six) hours as needed for wheezing or shortness of breath.    Marland Kitchen amLODipine (NORVASC) 2.5 MG tablet TAKE 1 TABLET BY MOUTH EVERY DAY 90 tablet 3  . DIGOX 125 MCG tablet TAKE 1 TABLET BY MOUTH EVERY DAY AS DIRECTED, TAKE NO TABLET ON WEDNESDAY OR SUNDAY 30 tablet 6  . hydrALAZINE (APRESOLINE) 10 MG tablet Take 1 tablet (10 mg total) by mouth 3 (three) times daily. 270 tablet 3  . levothyroxine (SYNTHROID, LEVOTHROID) 112 MCG tablet Take 112 mcg by mouth daily before breakfast.    . metoprolol tartrate (LOPRESSOR) 25 MG tablet TAKE 1 TABLET BY MOUTH DAILY 90 tablet 0  . pantoprazole (PROTONIX) 40 MG tablet TAKE 1 TABLET BY MOUTH EVERY DAY 90 tablet 0  . polyethylene glycol powder (GLYCOLAX/MIRALAX) powder Take 17 g by mouth  daily as needed for moderate constipation.     . temazepam (RESTORIL) 15 MG capsule Take 1 capsule (15 mg total) by mouth at bedtime as needed for sleep. 30 capsule 5   No current facility-administered medications for this visit.    Allergies:   Avapro; Clarithromycin; Losartan potassium-hctz; Maxzide; Ziac; Penicillins; and Sulfa drugs cross reactors    Social History:  The patient  reports that she has never smoked. She has never used smokeless tobacco. She reports that she does not drink alcohol or use illicit drugs.   Family History:  The patient's family history includes Heart attack in her father; Hypertension in her father and mother.    ROS:  Please see the history of present illness.   Otherwise, review of systems are positive for none.   All other systems are reviewed and negative.    PHYSICAL EXAM: VS:  BP 150/70 mmHg  Pulse 69  Ht 5\' 1"  (1.549 m)  Wt 146 lb 12.8 oz (66.588 kg)  BMI 27.75 kg/m2 , BMI Body mass index is 27.75 kg/(m^2). GEN: Well nourished, well developed, in no acute  distress HEENT: normal Neck: no JVD, carotid bruits, or masses Cardiac: RRR; no murmurs, rubs, or gallops,no edema  Respiratory:  clear to auscultation bilaterally, normal work of breathing GI: soft, nontender, nondistended, + BS MS: no deformity or atrophy Skin: warm and dry, no rash Neuro:  Strength and sensation are intact Psych: euthymic mood, full affect   EKG:  EKG is not ordered today.    Recent Labs: 09/07/2014: ALT 15; BUN 16; Creatinine, Ser 1.1; Potassium 3.8; Sodium 138 03/04/2015: Hemoglobin 14.3; Platelets 276    Lipid Panel    Component Value Date/Time   CHOL 148 09/07/2014 1019   TRIG 95.0 09/07/2014 1019   HDL 44.90 09/07/2014 1019   CHOLHDL 3 09/07/2014 1019   VLDL 19.0 09/07/2014 1019   LDLCALC 84 09/07/2014 1019      Wt Readings from Last 3 Encounters:  04/21/15 146 lb 12.8 oz (66.588 kg)  01/22/15 154 lb 1.6 oz (69.899 kg)  01/05/15 152 lb (68.947 kg)     ASSESSMENT AND PLAN:  1. benign hypertensive heart disease without heart failure. 2. GAVE (gastric antral vascular ectasia) 3. chronic iron deficiency anemia secondary to gastric antral vascular ectasia. 4. pulmonary hypertension 5. stable dyspnea on exertion, multifactorial. 6.  Past history of frequent PACs, currently quiescent  Plan: Continue on same medication. Recheck in 4 months for office visit.  We are increasing her hydralazine to 10 mg 3 times a day for better control of her systolic blood pressure.  We are checking CBC lipid panel hepatic function panel and basal metabolic panel today.   Current medicines are reviewed at length with the patient today.  The patient does not have concerns regarding medicines.  The following changes have been made:  no change  Labs/ tests ordered today include:   Orders Placed This Encounter  Procedures  . Lipid panel  . Hepatic function panel  . Basic metabolic panel  . CBC with Differential/Platelet      Signed, Darlin Coco  MD 04/21/2015 11:01 AM    [address], [place], [place]  [address] Phone: [phone]; Fax: [phone]

## 2015-04-21 NOTE — Patient Instructions (Signed)
Medication Instructions:  INCREASE YOUR HYDRALAZINE TO 10 MG THREE TIMES A DAY  Labwork: LP/BMET/HFP/CBC  Testing/Procedures: NONE  Follow-Up: Your physician wants you to follow-up in: 4 MONTH OV You will receive a reminder letter in the mail two months in advance. If you don't receive a letter, please call our office to schedule the follow-up appointment.

## 2015-04-21 NOTE — Progress Notes (Signed)
Quick Note:  Please report to patient. The recent labs are stable. Continue same medication and careful diet. Hemoglobin stable at 13.3 ______

## 2015-05-10 ENCOUNTER — Other Ambulatory Visit: Payer: Self-pay

## 2015-05-14 DIAGNOSIS — R002 Palpitations: Secondary | ICD-10-CM | POA: Diagnosis not present

## 2015-05-14 DIAGNOSIS — E039 Hypothyroidism, unspecified: Secondary | ICD-10-CM | POA: Diagnosis not present

## 2015-05-14 DIAGNOSIS — I270 Primary pulmonary hypertension: Secondary | ICD-10-CM | POA: Diagnosis not present

## 2015-05-14 DIAGNOSIS — I10 Essential (primary) hypertension: Secondary | ICD-10-CM | POA: Diagnosis not present

## 2015-06-07 ENCOUNTER — Other Ambulatory Visit (HOSPITAL_COMMUNITY): Payer: Self-pay

## 2015-06-09 ENCOUNTER — Other Ambulatory Visit (HOSPITAL_COMMUNITY): Payer: Self-pay | Admitting: Oncology

## 2015-06-09 ENCOUNTER — Encounter (HOSPITAL_COMMUNITY): Payer: Medicare Other | Attending: Hematology & Oncology

## 2015-06-09 DIAGNOSIS — D500 Iron deficiency anemia secondary to blood loss (chronic): Secondary | ICD-10-CM | POA: Insufficient documentation

## 2015-06-09 DIAGNOSIS — D509 Iron deficiency anemia, unspecified: Secondary | ICD-10-CM

## 2015-06-09 LAB — CBC WITH DIFFERENTIAL/PLATELET
Basophils Absolute: 0.1 10*3/uL (ref 0.0–0.1)
Basophils Relative: 2 % — ABNORMAL HIGH (ref 0–1)
Eosinophils Absolute: 0.2 10*3/uL (ref 0.0–0.7)
Eosinophils Relative: 3 % (ref 0–5)
HEMATOCRIT: 36.3 % (ref 36.0–46.0)
HEMOGLOBIN: 11.3 g/dL — AB (ref 12.0–15.0)
Lymphocytes Relative: 12 % (ref 12–46)
Lymphs Abs: 0.8 10*3/uL (ref 0.7–4.0)
MCH: 28.7 pg (ref 26.0–34.0)
MCHC: 31.1 g/dL (ref 30.0–36.0)
MCV: 92.1 fL (ref 78.0–100.0)
MONOS PCT: 9 % (ref 3–12)
Monocytes Absolute: 0.6 10*3/uL (ref 0.1–1.0)
NEUTROS PCT: 74 % (ref 43–77)
Neutro Abs: 5.1 10*3/uL (ref 1.7–7.7)
PLATELETS: 322 10*3/uL (ref 150–400)
RBC: 3.94 MIL/uL (ref 3.87–5.11)
RDW: 14.1 % (ref 11.5–15.5)
WBC: 6.8 10*3/uL (ref 4.0–10.5)

## 2015-06-09 LAB — IRON AND TIBC
Iron: 24 ug/dL — ABNORMAL LOW (ref 28–170)
SATURATION RATIOS: 6 % — AB (ref 10.4–31.8)
TIBC: 420 ug/dL (ref 250–450)
UIBC: 396 ug/dL

## 2015-06-09 LAB — FERRITIN: Ferritin: 11 ng/mL (ref 11–307)

## 2015-06-09 NOTE — Progress Notes (Signed)
Labs drawn

## 2015-06-11 ENCOUNTER — Encounter (HOSPITAL_BASED_OUTPATIENT_CLINIC_OR_DEPARTMENT_OTHER): Payer: Medicare Other

## 2015-06-11 ENCOUNTER — Encounter (HOSPITAL_COMMUNITY): Payer: Self-pay | Admitting: Oncology

## 2015-06-11 ENCOUNTER — Encounter (HOSPITAL_BASED_OUTPATIENT_CLINIC_OR_DEPARTMENT_OTHER): Payer: Medicare Other | Admitting: Oncology

## 2015-06-11 ENCOUNTER — Ambulatory Visit (HOSPITAL_COMMUNITY): Payer: PRIVATE HEALTH INSURANCE

## 2015-06-11 ENCOUNTER — Ambulatory Visit (HOSPITAL_COMMUNITY): Payer: PRIVATE HEALTH INSURANCE | Admitting: Oncology

## 2015-06-11 VITALS — BP 115/43 | HR 65 | Temp 97.7°F | Resp 16

## 2015-06-11 VITALS — BP 106/42 | HR 68 | Temp 97.7°F | Resp 16 | Wt 149.2 lb

## 2015-06-11 DIAGNOSIS — D500 Iron deficiency anemia secondary to blood loss (chronic): Secondary | ICD-10-CM

## 2015-06-11 DIAGNOSIS — K31819 Angiodysplasia of stomach and duodenum without bleeding: Secondary | ICD-10-CM

## 2015-06-11 DIAGNOSIS — K31811 Angiodysplasia of stomach and duodenum with bleeding: Secondary | ICD-10-CM | POA: Diagnosis not present

## 2015-06-11 DIAGNOSIS — D509 Iron deficiency anemia, unspecified: Secondary | ICD-10-CM

## 2015-06-11 MED ORDER — SODIUM CHLORIDE 0.9 % IV SOLN
Freq: Once | INTRAVENOUS | Status: AC
  Administered 2015-06-11: 14:00:00 via INTRAVENOUS

## 2015-06-11 MED ORDER — FERUMOXYTOL INJECTION 510 MG/17 ML
510.0000 mg | Freq: Once | INTRAVENOUS | Status: AC
Start: 2015-06-11 — End: 2015-06-11
  Administered 2015-06-11: 510 mg via INTRAVENOUS
  Filled 2015-06-11: qty 17

## 2015-06-11 MED ORDER — SODIUM CHLORIDE 0.9 % IJ SOLN
10.0000 mL | INTRAMUSCULAR | Status: DC | PRN
  Administered 2015-06-11: 10 mL
  Filled 2015-06-11: qty 10

## 2015-06-11 NOTE — Assessment & Plan Note (Signed)
S/P EGD by Dr. Daisey Must in Dec 2015.  He notes:  Extensive gastric antral Vassar [sic] ectasia along with few at cardia. Majority of these lesions were ablated with argon plasma coagulation.

## 2015-06-11 NOTE — Progress Notes (Signed)
Mary Cahill, MD  [place] [id number]  Iron deficiency anemia due to chronic blood loss  GAVE (gastric antral vascular ectasia)  CURRENT THERAPY: IV Feraheme 510 mg on days 1 and 8 every 12 weeks.  INTERVAL HISTORY: [name] 81 y.o. 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  I personally reviewed and went over laboratory results with the patient.  The results are noted within this dictation.   Chart reviewed.  She underwent EGD by Dr. Laural Golden on 10/23/2014 and he reports: Extensive gastric antral Vassar [sic] ectasia along with few at cardia. Majority of these lesions were ablated with argon plasma coagulation.  She reports that she feels great!  Her Hgb has been declining as of late.  We will monitor labs as planned for now.  Past Medical History  Diagnosis Date  . Hypertension   . Hypothyroidism   . Palpitations   . Anemia   . GAVE (gastric antral vascular ectasia) 09/12/12  . On home O2     wears 2L/[place] at night  . Iron deficiency anemia 10/15/2012  . Iron deficiency anemia due to chronic blood loss 04/20/2014    has Benign hypertensive heart disease without heart failure; Palpitations; Hypothyroidism; GERD (gastroesophageal reflux disease); Pruritus; Pulmonary hypertension; Pulmonary nodule; Chronic respiratory failure; Constipation; Acute posthemorrhagic anemia; GAVE (gastric antral vascular ectasia); Iron deficiency anemia due to chronic blood loss; and Family history of aortic aneurysm on her problem list.     is allergic to avapro; clarithromycin; losartan potassium-hctz; maxzide; ziac; penicillins; and sulfa drugs cross reactors.  Ms. Lamson does not currently have medications on file.  Past Surgical History  Procedure Laterality Date  . Tonsillectomy and adenoidectomy    . US echocardiography  03/12/2006    EF 55-60%  . Knee  replacement rt 3 yrs ago    . Back surger x 2    . Abdominal hysterectomy    . Shoulder surgery    . Colonoscopy  07/24/2012    Procedure: COLONOSCOPY;  Surgeon: Rogene Houston, MD;  [facility];  Service: Endoscopy;  Laterality: N/A;  200  . Heel spur surgery    . Esophagogastroduodenoscopy  09/12/2012    Procedure: ESOPHAGOGASTRODUODENOSCOPY (EGD);  Surgeon: Rogene Houston, MD;  [facility];  Service: Endoscopy;  Laterality: N/A;  325-changed to 200 per Ann-pt moved up to [place] to notify pt  . Total abdominal hysterectomy    . Esophagogastroduodenoscopy  11/01/2012    Procedure: ESOPHAGOGASTRODUODENOSCOPY (EGD);  Surgeon: Rogene Houston, MD;  [facility];  Service: Endoscopy;  Laterality: N/A;  1:20  . Hot hemostasis  11/01/2012    Procedure: HOT HEMOSTASIS (ARGON PLASMA COAGULATION/BICAP);  Surgeon: Rogene Houston, MD;  [facility];  Service: Endoscopy;  Laterality: N/A;  . Esophagogastroduodenoscopy N/A 07/04/2013    Procedure: ESOPHAGOGASTRODUODENOSCOPY (EGD);  Surgeon: Rogene Houston, MD;  [facility];  Service: Endoscopy;  Laterality: N/A;  850  . Hot hemostasis N/A 07/04/2013    Procedure: HOT HEMOSTASIS (ARGON PLASMA COAGULATION/BICAP);  Surgeon: Rogene Houston, MD;  [facility];  Service: Endoscopy;  Laterality: N/A;  . Esophagogastroduodenoscopy N/A 08/06/2013    Procedure: ESOPHAGOGASTRODUODENOSCOPY (EGD);  Surgeon: Rogene Houston, MD;  [facility];  Service: Endoscopy;  Laterality: N/A;  1200  . Hot hemostasis  N/A 08/06/2013    Procedure: HOT HEMOSTASIS (ARGON PLASMA COAGULATION/BICAP);  Surgeon: Rogene Houston, MD;  [facility];  Service: Endoscopy;  Laterality: N/A;  . Esophagogastroduodenoscopy N/A 10/31/2013    Procedure: ESOPHAGOGASTRODUODENOSCOPY (EGD);  Surgeon: Rogene Houston, MD;  [facility];  Service: Endoscopy;  Laterality: N/A;  125-moved to [place]  notified pt  . Hot hemostasis N/A 10/31/2013    Procedure: HOT HEMOSTASIS (ARGON PLASMA COAGULATION/BICAP);  Surgeon: Rogene Houston, MD;  [facility];  Service: Endoscopy;  Laterality: N/A;  . Lumbar fusion  06/25/14    L4 & L5  . Esophagogastroduodenoscopy N/A 10/23/2014    Procedure: ESOPHAGOGASTRODUODENOSCOPY (EGD);  Surgeon: Rogene Houston, MD;  [facility];  Service: Endoscopy;  Laterality: N/A;  1020  . Hot hemostasis N/A 10/23/2014    Procedure: HOT HEMOSTASIS (ARGON PLASMA COAGULATION/BICAP);  Surgeon: Rogene Houston, MD;  [facility];  Service: Endoscopy;  Laterality: N/A;    Denies any headaches, dizziness, double vision, fevers, chills, night sweats, nausea, vomiting, diarrhea, constipation, chest pain, heart palpitations, shortness of breath, blood in stool, black tarry stool, urinary pain, urinary burning, urinary frequency, hematuria.   PHYSICAL EXAMINATION  ECOG PERFORMANCE STATUS: 0 - Asymptomatic  Filed Vitals:   06/11/15 1345  BP: 106/42  Pulse: 68  Temp: 97.7 F (36.5 C)  Resp: 16    GENERAL:alert, no distress, well nourished, well developed, comfortable, cooperative and smiling SKIN: skin color, texture, turgor are normal, no rashes or significant lesions HEAD: Normocephalic, No masses, lesions, tenderness or abnormalities EYES: normal, PERRLA, EOMI, Conjunctiva are pink and non-injected EARS: External ears normal OROPHARYNX:lips, buccal mucosa, and tongue normal and mucous membranes are moist  NECK: supple, no adenopathy, thyroid normal size, non-tender, without nodularity, no stridor, non-tender, trachea midline LYMPH:  no palpable lymphadenopathy BREAST:not examined LUNGS: clear to auscultation and percussion HEART: regular rate & rhythm, no murmurs, no gallops, S1 normal and S2 normal ABDOMEN:abdomen soft, non-tender and normal bowel sounds BACK: Back symmetric, no curvature., No CVA tenderness EXTREMITIES:less then  2 second capillary refill, no joint deformities, effusion, or inflammation, no edema, no skin discoloration, no clubbing, no cyanosis  NEURO: alert & oriented x 3 with fluent speech, no focal motor/sensory deficits, gait normal   LABORATORY DATA: CBC    Component Value Date/Time   WBC 6.8 06/09/2015 1002   RBC 3.94 06/09/2015 1002   RBC 4.12 08/20/2012 1725   HGB 11.3* 06/09/2015 1002   HCT 36.3 06/09/2015 1002   PLT 322 06/09/2015 1002   MCV 92.1 06/09/2015 1002   MCH 28.7 06/09/2015 1002   MCHC 31.1 06/09/2015 1002   RDW 14.1 06/09/2015 1002   LYMPHSABS 0.8 06/09/2015 1002   MONOABS 0.6 06/09/2015 1002   EOSABS 0.2 06/09/2015 1002   BASOSABS 0.1 06/09/2015 1002      Chemistry      Component Value Date/Time   NA 137 04/21/2015 1050   K 4.0 04/21/2015 1050   CL 104 04/21/2015 1050   CO2 30 04/21/2015 1050   BUN 20 04/21/2015 1050   CREATININE 1.04 04/21/2015 1050      Component Value Date/Time   CALCIUM 9.3 04/21/2015 1050   ALKPHOS 84 04/21/2015 1050   AST 21 04/21/2015 1050   ALT 15 04/21/2015 1050   BILITOT 0.5 04/21/2015 1050     Lab Results  Component Value Date   IRON 24* 06/09/2015   TIBC 420 06/09/2015   FERRITIN  11 06/09/2015     ASSESSMENT AND PLAN:  Iron deficiency anemia due to chronic blood loss Secondary to GAVE Syndrome.    She is stable with IV Feraheme 510 mg on days 1 and 8 every 12 weeks.    Will continue to monitor labs and continue with supportive therapy plan as developed.  Supportive therapy plan reviewed.    Labs every 12 weeks with IV Feraheme every 12 weeks.  Will need to perform Iron studies 2 days prior to IV Feraheme appointment.    Will hold IV Feraheme is ferritin is noted to be 350 or higher.    Return in 6 months for follow-up.  GAVE (gastric antral vascular ectasia) S/P EGD by Dr. Daisey Must in Dec 2015.  He notes:  Extensive gastric antral Vassar [sic] ectasia along with few at cardia. Majority of these lesions were  ablated with argon plasma coagulation.        THERAPY PLAN:  Continue with supportive therapy plan as ordered with labs every 12 weeks.   All questions were answered. The patient knows to call the clinic with any problems, questions or concerns. We can certainly see the patient much sooner if necessary.  Patient and plan discussed with Dr. Ancil Linsey and she is in agreement with the aforementioned.   This note is electronically signed by: Robynn Pane 06/11/2015 2:20 PM

## 2015-06-11 NOTE — Assessment & Plan Note (Addendum)
Secondary to GAVE Syndrome.    Will continue to monitor labs and continue with supportive therapy plan as developed.  Supportive therapy plan reviewed.    Labs every 12 weeks with IV Feraheme every 12 weeks.  Will need to perform Iron studies 2 days prior to IV Feraheme appointment to confirm iron deficiency.  Of note, I have noticed her Hgb to decline over the past number of months.  If this continues in 12 weeks, and she is iron deficient, I will need to consider an additional iron infusion or change supportive therapy plan to every 8 weeks.  We'll see what her labs demonstrate in 12 weeks.  Return next week for 2nd dose of IV Feraheme.   Will hold IV Feraheme is ferritin is noted to be 350 or higher.    Return in 6 months for follow-up.

## 2015-06-11 NOTE — Patient Instructions (Signed)
[place] at [facility] Discharge Instructions  RECOMMENDATIONS MADE BY THE CONSULTANT AND ANY TEST RESULTS WILL BE SENT TO YOUR REFERRING PHYSICIAN.  Feraheme infusion today. Return as scheduled for lab work, infusions, and office visit.   Thank you for choosing [place] at [facility] to provide your oncology and hematology care.  To afford each patient quality time with our provider, please arrive at least 15 minutes before your scheduled appointment time.    You need to re-schedule your appointment should you arrive 10 or more minutes late.  We strive to give you quality time with our providers, and arriving late affects you and other patients whose appointments are after yours.  Also, if you no show three or more times for appointments you may be dismissed from the clinic at the providers discretion.     Again, thank you for choosing [facility].  Our hope is that these requests will decrease the amount of time that you wait before being seen by our physicians.       _____________________________________________________________  Should you have questions after your visit to [facility], please contact our office at [phone] between the hours of 8:30 a.m. and 4:30 p.m.  Voicemails left after 4:30 p.m. will not be returned until the following business day.  For prescription refill requests, have your pharmacy contact our office.

## 2015-06-11 NOTE — Patient Instructions (Addendum)
[place] at [facility] Discharge Instructions  RECOMMENDATIONS MADE BY THE CONSULTANT AND ANY TEST RESULTS WILL BE SENT TO YOUR REFERRING PHYSICIAN.  Exam and discussion by Robynn Pane, PA-C You are doing better. Call with increased dyspnea or fatigue Feraheme next week as scheduled. Labs in 12 weeks before feraheme in 12 weeks. Feraheme day 1 and day 8 in 12 weeks Follow-up in 6 months.  Thank you for choosing [place] at [facility] to provide your oncology and hematology care.  To afford each patient quality time with our provider, please arrive at least 15 minutes before your scheduled appointment time.    You need to re-schedule your appointment should you arrive 10 or more minutes late.  We strive to give you quality time with our providers, and arriving late affects you and other patients whose appointments are after yours.  Also, if you no show three or more times for appointments you may be dismissed from the clinic at the providers discretion.     Again, thank you for choosing [facility].  Our hope is that these requests will decrease the amount of time that you wait before being seen by our physicians.       _____________________________________________________________  Should you have questions after your visit to [facility], please contact our office at [phone] between the hours of 8:30 a.m. and 4:30 p.m.  Voicemails left after 4:30 p.m. will not be returned until the following business day.  For prescription refill requests, have your pharmacy contact our office.

## 2015-06-11 NOTE — Progress Notes (Signed)
See office visit encounter for assessment and further information.

## 2015-06-18 ENCOUNTER — Encounter (HOSPITAL_COMMUNITY): Payer: Medicare Other | Attending: Hematology & Oncology

## 2015-06-18 ENCOUNTER — Encounter (HOSPITAL_COMMUNITY): Payer: Self-pay

## 2015-06-18 VITALS — BP 127/42 | HR 57 | Temp 97.6°F | Resp 18

## 2015-06-18 DIAGNOSIS — K31819 Angiodysplasia of stomach and duodenum without bleeding: Secondary | ICD-10-CM

## 2015-06-18 DIAGNOSIS — D500 Iron deficiency anemia secondary to blood loss (chronic): Secondary | ICD-10-CM | POA: Diagnosis present

## 2015-06-18 DIAGNOSIS — D509 Iron deficiency anemia, unspecified: Secondary | ICD-10-CM

## 2015-06-18 MED ORDER — SODIUM CHLORIDE 0.9 % IJ SOLN: 10.0000 mL | INTRAMUSCULAR | Status: DC | PRN

## 2015-06-18 MED ORDER — SODIUM CHLORIDE 0.9 % IJ SOLN: 3.0000 mL | Freq: Once | INTRAMUSCULAR | Status: DC | PRN

## 2015-06-18 MED ORDER — SODIUM CHLORIDE 0.9 % IV SOLN
Freq: Once | INTRAVENOUS | Status: AC
  Administered 2015-06-18: 14:00:00 via INTRAVENOUS

## 2015-06-18 MED ORDER — SODIUM CHLORIDE 0.9 % IV SOLN
510.0000 mg | Freq: Once | INTRAVENOUS | Status: AC
  Administered 2015-06-18: 510 mg via INTRAVENOUS
  Filled 2015-06-18: qty 17

## 2015-06-18 NOTE — Progress Notes (Signed)
[place] Tolerated chemotherapy well today Discharged ambulatory

## 2015-06-18 NOTE — Patient Instructions (Signed)
[facility] at [facility] Discharge Instructions  RECOMMENDATIONS MADE BY THE CONSULTANT AND ANY TEST RESULTS WILL BE SENT TO YOUR REFERRING PHYSICIAN.  IV iron infusion today Follow up as scheduled Please call the clinic if you have any questions or concerns  Thank you for choosing [facility] at [facility] to provide your oncology and hematology care.  To afford each patient quality time with our provider, please arrive at least 15 minutes before your scheduled appointment time.    You need to re-schedule your appointment should you arrive 10 or more minutes late.  We strive to give you quality time with our providers, and arriving late affects you and other patients whose appointments are after yours.  Also, if you no show three or more times for appointments you may be dismissed from the clinic at the providers discretion.     Again, thank you for choosing [facility].  Our hope is that these requests will decrease the amount of time that you wait before being seen by our physicians.       _____________________________________________________________  Should you have questions after your visit to  Cancer Center, please contact our office at [phone] between the hours of 8:30 a.m. and 4:30 p.m.  Voicemails left after 4:30 p.m. will not be returned until the following business day.  For prescription refill requests, have your pharmacy contact our office.     

## 2015-06-28 DIAGNOSIS — M7071 Other bursitis of hip, right hip: Secondary | ICD-10-CM | POA: Diagnosis not present

## 2015-07-05 ENCOUNTER — Other Ambulatory Visit: Payer: Self-pay | Admitting: Cardiology

## 2015-07-26 ENCOUNTER — Other Ambulatory Visit: Payer: Self-pay | Admitting: Cardiology

## 2015-07-26 ENCOUNTER — Ambulatory Visit (HOSPITAL_COMMUNITY): Payer: PRIVATE HEALTH INSURANCE | Admitting: Oncology

## 2015-07-30 DIAGNOSIS — R070 Pain in throat: Secondary | ICD-10-CM | POA: Diagnosis not present

## 2015-08-10 DIAGNOSIS — M7071 Other bursitis of hip, right hip: Secondary | ICD-10-CM | POA: Diagnosis not present

## 2015-08-17 ENCOUNTER — Other Ambulatory Visit: Payer: Self-pay | Admitting: Cardiology

## 2015-08-19 IMAGING — CR DG CHEST 2V
2 series · 2 of 2 positions shown · non-contrast
Comparison: None.

CLINICAL DATA: Cough and wheezing.

EXAM:
CHEST  2 VIEW

[view not recorded (1 of 2)]
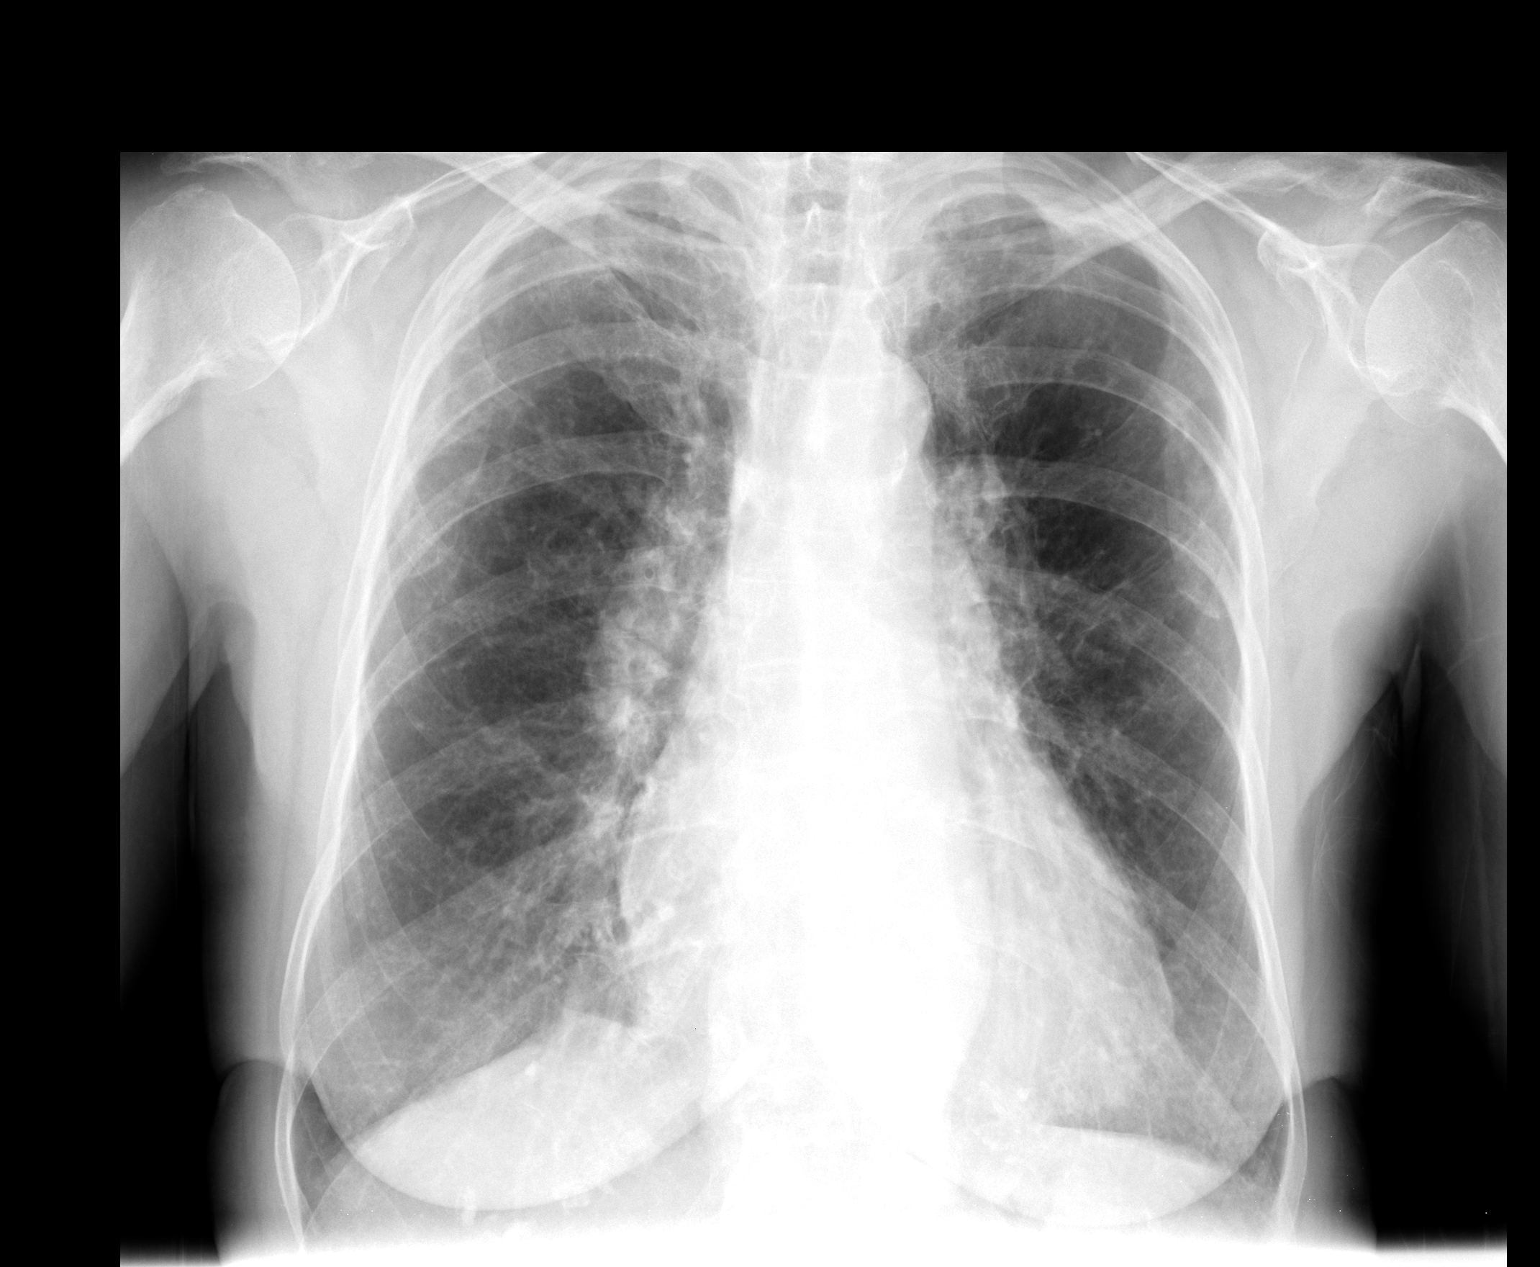

[view not recorded (2 of 2)]
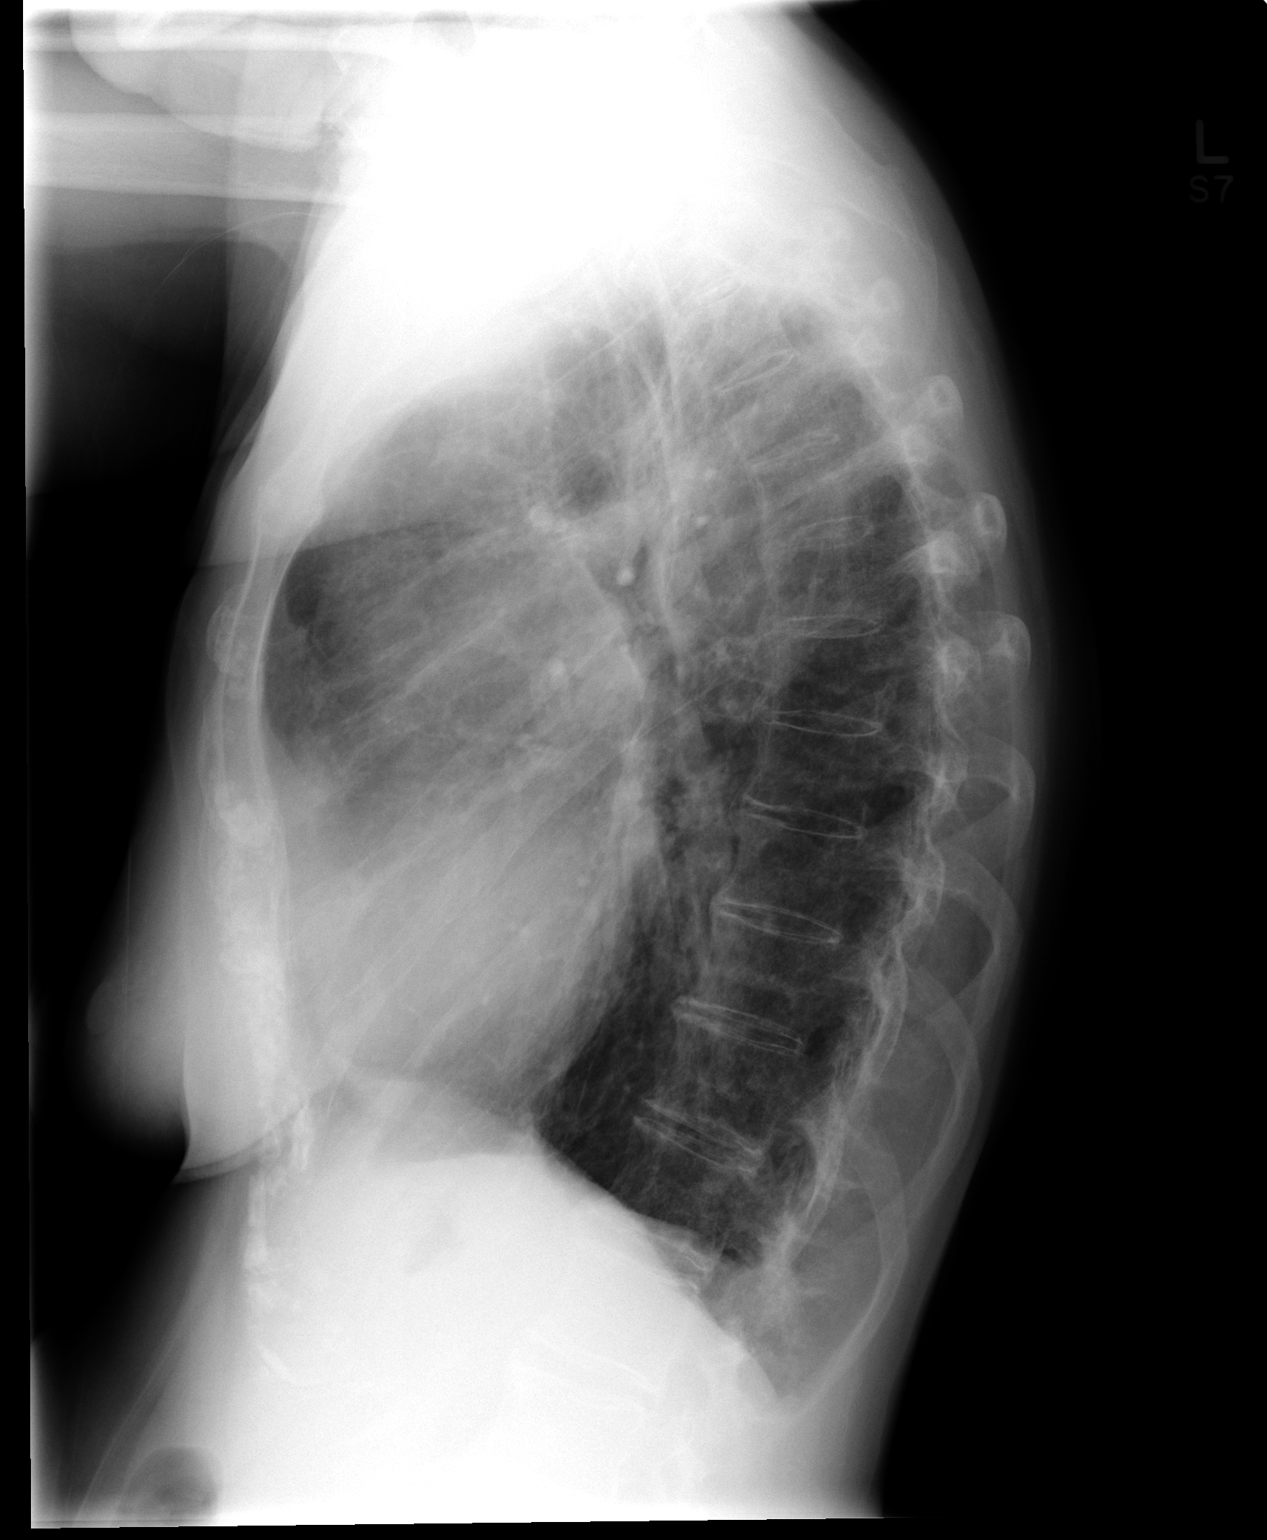

[2 of 2 positions shown; findings below may reference images not displayed]

FINDINGS: The lungs are hyperexpanded with flattened hemidiaphragms consistent
with COPD. There are areas of coarse reticular opacity most evident
in the upper lobes consistent with scarring. There is mild apical
pleural thickening. There is no lung consolidation. No pulmonary
edema. No discrete lung mass or nodule is seen. No pleural effusion
or pneumothorax.

Cardiac silhouette is mildly enlarged. Normal mediastinal and hilar
contours.

Bony thorax is demineralized but grossly intact.
IMPRESSION: No acute cardiopulmonary disease.

COPD and lung scarring.

## 2015-08-31 ENCOUNTER — Other Ambulatory Visit (HOSPITAL_COMMUNITY): Payer: Self-pay

## 2015-08-31 ENCOUNTER — Other Ambulatory Visit (HOSPITAL_COMMUNITY): Payer: Self-pay | Admitting: Oncology

## 2015-08-31 DIAGNOSIS — D500 Iron deficiency anemia secondary to blood loss (chronic): Secondary | ICD-10-CM

## 2015-08-31 DIAGNOSIS — D485 Neoplasm of uncertain behavior of skin: Secondary | ICD-10-CM | POA: Diagnosis not present

## 2015-08-31 DIAGNOSIS — L570 Actinic keratosis: Secondary | ICD-10-CM | POA: Diagnosis not present

## 2015-09-02 DIAGNOSIS — D0461 Carcinoma in situ of skin of right upper limb, including shoulder: Secondary | ICD-10-CM | POA: Diagnosis not present

## 2015-09-03 ENCOUNTER — Encounter (HOSPITAL_COMMUNITY): Payer: Medicare Other | Attending: Hematology & Oncology

## 2015-09-03 DIAGNOSIS — D500 Iron deficiency anemia secondary to blood loss (chronic): Secondary | ICD-10-CM

## 2015-09-03 DIAGNOSIS — D801 Nonfamilial hypogammaglobulinemia: Secondary | ICD-10-CM

## 2015-09-03 LAB — CBC WITH DIFFERENTIAL/PLATELET
BASOS ABS: 0.1 10*3/uL (ref 0.0–0.1)
BASOS PCT: 1 %
EOS ABS: 0.3 10*3/uL (ref 0.0–0.7)
Eosinophils Relative: 3 %
HEMATOCRIT: 38 % (ref 36.0–46.0)
Hemoglobin: 11.8 g/dL — ABNORMAL LOW (ref 12.0–15.0)
Lymphocytes Relative: 11 %
Lymphs Abs: 0.9 10*3/uL (ref 0.7–4.0)
MCH: 28.9 pg (ref 26.0–34.0)
MCHC: 31.1 g/dL (ref 30.0–36.0)
MCV: 93.1 fL (ref 78.0–100.0)
MONO ABS: 1.1 10*3/uL — AB (ref 0.1–1.0)
Monocytes Relative: 12 %
NEUTROS ABS: 6.3 10*3/uL (ref 1.7–7.7)
Neutrophils Relative %: 73 %
PLATELETS: 331 10*3/uL (ref 150–400)
RBC: 4.08 MIL/uL (ref 3.87–5.11)
RDW: 16.1 % — AB (ref 11.5–15.5)
WBC: 8.6 10*3/uL (ref 4.0–10.5)

## 2015-09-03 LAB — COMPREHENSIVE METABOLIC PANEL
ALBUMIN: 3.5 g/dL (ref 3.5–5.0)
ALK PHOS: 80 U/L (ref 38–126)
ALT: 16 U/L (ref 14–54)
ANION GAP: 7 (ref 5–15)
AST: 21 U/L (ref 15–41)
BILIRUBIN TOTAL: 0.6 mg/dL (ref 0.3–1.2)
BUN: 23 mg/dL — ABNORMAL HIGH (ref 6–20)
CALCIUM: 9.2 mg/dL (ref 8.9–10.3)
CO2: 26 mmol/L (ref 22–32)
CREATININE: 1.14 mg/dL — AB (ref 0.44–1.00)
Chloride: 106 mmol/L (ref 101–111)
GFR calc non Af Amer: 44 mL/min — ABNORMAL LOW (ref >60)
GFR, EST AFRICAN AMERICAN: 51 mL/min — AB (ref >60)
GLUCOSE: 109 mg/dL — AB (ref 65–99)
Potassium: 4.2 mmol/L (ref 3.5–5.1)
Sodium: 139 mmol/L (ref 135–145)
TOTAL PROTEIN: 6.5 g/dL (ref 6.5–8.1)

## 2015-09-03 LAB — IRON AND TIBC
IRON: 28 ug/dL (ref 28–170)
Saturation Ratios: 7 % — ABNORMAL LOW (ref 10.4–31.8)
TIBC: 428 ug/dL (ref 250–450)
UIBC: 400 ug/dL

## 2015-09-03 LAB — FERRITIN: FERRITIN: 15 ng/mL (ref 11–307)

## 2015-09-03 NOTE — Progress Notes (Signed)
Labs drawn

## 2015-09-05 LAB — KAPPA/LAMBDA LIGHT CHAINS
KAPPA FREE LGHT CHN: 37.68 mg/L — AB (ref 3.30–19.40)
Kappa, lambda light chain ratio: 1 (ref 0.26–1.65)
Lambda free light chains: 37.86 mg/L — ABNORMAL HIGH (ref 5.71–26.30)

## 2015-09-06 ENCOUNTER — Other Ambulatory Visit (HOSPITAL_COMMUNITY): Payer: Self-pay | Admitting: Oncology

## 2015-09-06 DIAGNOSIS — D500 Iron deficiency anemia secondary to blood loss (chronic): Secondary | ICD-10-CM

## 2015-09-06 DIAGNOSIS — K31819 Angiodysplasia of stomach and duodenum without bleeding: Secondary | ICD-10-CM

## 2015-09-06 LAB — MULTIPLE MYELOMA PANEL, SERUM
ALBUMIN/GLOB SERPL: 1.1 (ref 0.7–1.7)
Albumin SerPl Elph-Mcnc: 3.2 g/dL (ref 2.9–4.4)
Alpha 1: 0.3 g/dL (ref 0.0–0.4)
Alpha2 Glob SerPl Elph-Mcnc: 0.7 g/dL (ref 0.4–1.0)
B-Globulin SerPl Elph-Mcnc: 1.2 g/dL (ref 0.7–1.3)
Gamma Glob SerPl Elph-Mcnc: 0.9 g/dL (ref 0.4–1.8)
Globulin, Total: 3 g/dL (ref 2.2–3.9)
IGA: 415 mg/dL (ref 64–422)
IGM, SERUM: 32 mg/dL (ref 26–217)
IgG (Immunoglobin G), Serum: 909 mg/dL (ref 700–1600)
Total Protein ELP: 6.2 g/dL (ref 6.0–8.5)

## 2015-09-07 ENCOUNTER — Encounter (HOSPITAL_COMMUNITY): Payer: Self-pay

## 2015-09-07 ENCOUNTER — Encounter (HOSPITAL_BASED_OUTPATIENT_CLINIC_OR_DEPARTMENT_OTHER): Payer: Medicare Other

## 2015-09-07 VITALS — BP 114/51 | HR 60 | Temp 97.7°F | Resp 18

## 2015-09-07 DIAGNOSIS — D500 Iron deficiency anemia secondary to blood loss (chronic): Secondary | ICD-10-CM | POA: Diagnosis present

## 2015-09-07 DIAGNOSIS — K31819 Angiodysplasia of stomach and duodenum without bleeding: Secondary | ICD-10-CM

## 2015-09-07 MED ORDER — SODIUM CHLORIDE 0.9 % IV SOLN
510.0000 mg | Freq: Once | INTRAVENOUS | Status: AC
  Administered 2015-09-07: 510 mg via INTRAVENOUS
  Filled 2015-09-07: qty 17

## 2015-09-07 MED ORDER — SODIUM CHLORIDE 0.9 % IV SOLN
INTRAVENOUS | Status: DC
  Administered 2015-09-07: 14:00:00 via INTRAVENOUS

## 2015-09-07 NOTE — Patient Instructions (Signed)
[place] at [facility] Discharge Instructions  RECOMMENDATIONS MADE BY THE CONSULTANT AND ANY TEST RESULTS WILL BE SENT TO YOUR REFERRING PHYSICIAN.  Iron infusion today and in 7 days. Return as scheduled for lab work and office visit.   Thank you for choosing [place] at [facility] to provide your oncology and hematology care.  To afford each patient quality time with our provider, please arrive at least 15 minutes before your scheduled appointment time.    You need to re-schedule your appointment should you arrive 10 or more minutes late.  We strive to give you quality time with our providers, and arriving late affects you and other patients whose appointments are after yours.  Also, if you no show three or more times for appointments you may be dismissed from the clinic at the providers discretion.     Again, thank you for choosing [facility].  Our hope is that these requests will decrease the amount of time that you wait before being seen by our physicians.       _____________________________________________________________  Should you have questions after your visit to [facility], please contact our office at [phone] between the hours of 8:30 a.m. and 4:30 p.m.  Voicemails left after 4:30 p.m. will not be returned until the following business day.  For prescription refill requests, have your pharmacy contact our office.

## 2015-09-07 NOTE — Progress Notes (Signed)
Tolerated infusion w/o adverse reaction. VSS.  A&Ox4; in no distress.

## 2015-09-14 ENCOUNTER — Ambulatory Visit (HOSPITAL_COMMUNITY): Payer: Medicare Other

## 2015-09-14 ENCOUNTER — Other Ambulatory Visit: Payer: Self-pay | Admitting: Cardiology

## 2015-09-14 DIAGNOSIS — Z23 Encounter for immunization: Secondary | ICD-10-CM | POA: Diagnosis not present

## 2015-09-16 ENCOUNTER — Ambulatory Visit (INDEPENDENT_AMBULATORY_CARE_PROVIDER_SITE_OTHER): Payer: Medicare Other | Admitting: Cardiology

## 2015-09-16 ENCOUNTER — Encounter: Payer: Self-pay | Admitting: Cardiology

## 2015-09-16 VITALS — BP 142/80 | HR 76 | Ht 61.0 in | Wt 144.4 lb

## 2015-09-16 DIAGNOSIS — R634 Abnormal weight loss: Secondary | ICD-10-CM | POA: Diagnosis not present

## 2015-09-16 DIAGNOSIS — I119 Hypertensive heart disease without heart failure: Secondary | ICD-10-CM | POA: Diagnosis not present

## 2015-09-16 DIAGNOSIS — Z79899 Other long term (current) drug therapy: Secondary | ICD-10-CM

## 2015-09-16 DIAGNOSIS — R002 Palpitations: Secondary | ICD-10-CM | POA: Diagnosis not present

## 2015-09-16 DIAGNOSIS — R0602 Shortness of breath: Secondary | ICD-10-CM

## 2015-09-16 DIAGNOSIS — D509 Iron deficiency anemia, unspecified: Secondary | ICD-10-CM | POA: Diagnosis not present

## 2015-09-16 LAB — LIPID PANEL
CHOL/HDL RATIO: 3 ratio (ref <=5.0)
Cholesterol: 146 mg/dL (ref 125–200)
HDL: 48 mg/dL (ref >=46)
LDL Cholesterol: 77 mg/dL (ref <130)
TRIGLYCERIDES: 106 mg/dL (ref <150)
VLDL: 21 mg/dL (ref <30)

## 2015-09-16 LAB — HEPATIC FUNCTION PANEL
ALBUMIN: 3.4 g/dL — AB (ref 3.6–5.1)
ALK PHOS: 83 U/L (ref 33–130)
ALT: 16 U/L (ref 6–29)
AST: 21 U/L (ref 10–35)
Bilirubin, Direct: 0.1 mg/dL (ref <=0.2)
Indirect Bilirubin: 0.2 mg/dL (ref 0.2–1.2)
TOTAL PROTEIN: 6.4 g/dL (ref 6.1–8.1)
Total Bilirubin: 0.3 mg/dL (ref 0.2–1.2)

## 2015-09-16 LAB — BASIC METABOLIC PANEL
BUN: 18 mg/dL (ref 7–25)
CALCIUM: 9.1 mg/dL (ref 8.6–10.4)
CO2: 27 mmol/L (ref 20–31)
CREATININE: 1.07 mg/dL — AB (ref 0.60–0.88)
Chloride: 103 mmol/L (ref 98–110)
Glucose, Bld: 89 mg/dL (ref 65–99)
Potassium: 3.9 mmol/L (ref 3.5–5.3)
SODIUM: 138 mmol/L (ref 135–146)

## 2015-09-16 LAB — CBC WITH DIFFERENTIAL/PLATELET
BASOS PCT: 2 % — AB (ref 0–1)
Basophils Absolute: 0.1 10*3/uL (ref 0.0–0.1)
EOS PCT: 4 % (ref 0–5)
Eosinophils Absolute: 0.3 10*3/uL (ref 0.0–0.7)
HCT: 38.6 % (ref 36.0–46.0)
Hemoglobin: 12.3 g/dL (ref 12.0–15.0)
Lymphocytes Relative: 12 % (ref 12–46)
Lymphs Abs: 0.9 10*3/uL (ref 0.7–4.0)
MCH: 28.9 pg (ref 26.0–34.0)
MCHC: 31.9 g/dL (ref 30.0–36.0)
MCV: 90.8 fL (ref 78.0–100.0)
MONO ABS: 1 10*3/uL (ref 0.1–1.0)
MPV: 9.2 fL (ref 8.6–12.4)
Monocytes Relative: 13 % — ABNORMAL HIGH (ref 3–12)
Neutro Abs: 5.1 10*3/uL (ref 1.7–7.7)
Neutrophils Relative %: 69 % (ref 43–77)
PLATELETS: 358 10*3/uL (ref 150–400)
RBC: 4.25 MIL/uL (ref 3.87–5.11)
RDW: 17.7 % — AB (ref 11.5–15.5)
WBC: 7.4 10*3/uL (ref 4.0–10.5)

## 2015-09-16 LAB — DIGOXIN LEVEL

## 2015-09-16 NOTE — Progress Notes (Signed)
Cardiology Office Note   Date:  09/16/2015   ID:  [name], DOB 12/16/1932, MRN [id number]  PCP:  Wende Neighbors, MD  Cardiologist: Darlin Coco MD  Chief Complaint  Patient presents with  . Coronary Artery Disease      History of Present Illness: [name] is a 81 y.o. 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 She has a past history of angina hypertension and a history of frequent premature atrial beats. She had a nuclear stress test on 12/21/11 because of exertional dyspnea. She was found to have no ischemia and her ejection fraction was 83% by Myoview. She subsequently was found to have severe iron deficiency. She was evaluated by Dr. Abran Duke who gave her IV iron infusion and she felt better almost immediately. She had been experiencing itching of her skin which resolved and she also had been craving ice which resolved after the iron infusion. The patient had an echocardiogram on 09/24/12 which showed normal left ventricular systolic function with an ejection fraction of 55-60%. She had moderate pulmonary hypertension with a pulmonary artery pressure of 54 and she has had a subsequent sleep study and is being followed by pulmonary. She had an updated echocardiogram on 01/29/14 which showed an ejection fraction of 55-60% with grade 2 diastolic dysfunction and a pulmonary artery pressure of 47 which was down from a previous level of 54. There was mild aortic insufficiency. She is still using oxygen 2 L per minute at night but does not have to use it during the day when she is more active. Recently the patient had recurrent GI bleed. She has a diagnosis of GAVE. She had an endoscopy in December 2015. Her last hemoglobin was up to 14.3. She always feels better after she gets her infusion of iron every 3 months. She is followed by hematology.  . Brother had an aneurysm of the descending  aorta. Her sister had what sounds like a aortic dissection of her descending thoracic aorta. The patient is concerned that she be checked for aneurysms herself. For that reason we obtained a CT of the abdomen at her last visit which did not show any evidence of a abdominal aortic aneurysm. She was relieved. In August 2015 she underwent successful back surgery by Dr. Glenna Fellows. Her back feels very well now.   last visit she has been doing well.  She uses her home oxygen only at night.  She is sleeping better and has not had to use her sleeping pills lately. Several weeks ago she forgot to take her metoprolol for 2 consecutive days and noted increased palpitations until she realized that she had forgotten to take her medication.  Her blood pressure at home has been stable and she rarely has to take her third hydralazine.  Normally she takes just 2 daily.  her appetite is fair but she is having difficulty showing because of new dentures.  She cannot bite on hard foods. Her weight is down 5 pounds. Past Medical History  Diagnosis Date  . Hypertension   . Hypothyroidism   . Palpitations   . Anemia   . GAVE (gastric antral vascular ectasia) 09/12/12  . On home O2     wears 2L/[place] at night  . Iron deficiency anemia 10/15/2012  . Iron deficiency anemia due to chronic blood loss 04/20/2014    Past Surgical History  Procedure Laterality Date  .  Tonsillectomy and adenoidectomy    . US echocardiography  03/12/2006    EF 55-60%  . Knee replacement rt 3 yrs ago    . Back surger x 2    . Abdominal hysterectomy    . Shoulder surgery    . Colonoscopy  07/24/2012    Procedure: COLONOSCOPY;  Surgeon: Rogene Houston, MD;  [facility];  Service: Endoscopy;  Laterality: N/A;  200  . Heel spur surgery    . Esophagogastroduodenoscopy  09/12/2012    Procedure: ESOPHAGOGASTRODUODENOSCOPY (EGD);  Surgeon: Rogene Houston, MD;  [facility];  Service: Endoscopy;  Laterality: N/A;   325-changed to 200 per Ann-pt moved up to [place] to notify pt  . Total abdominal hysterectomy    . Esophagogastroduodenoscopy  11/01/2012    Procedure: ESOPHAGOGASTRODUODENOSCOPY (EGD);  Surgeon: Rogene Houston, MD;  [facility];  Service: Endoscopy;  Laterality: N/A;  1:20  . Hot hemostasis  11/01/2012    Procedure: HOT HEMOSTASIS (ARGON PLASMA COAGULATION/BICAP);  Surgeon: Rogene Houston, MD;  [facility];  Service: Endoscopy;  Laterality: N/A;  . Esophagogastroduodenoscopy N/A 07/04/2013    Procedure: ESOPHAGOGASTRODUODENOSCOPY (EGD);  Surgeon: Rogene Houston, MD;  [facility];  Service: Endoscopy;  Laterality: N/A;  850  . Hot hemostasis N/A 07/04/2013    Procedure: HOT HEMOSTASIS (ARGON PLASMA COAGULATION/BICAP);  Surgeon: Rogene Houston, MD;  [facility];  Service: Endoscopy;  Laterality: N/A;  . Esophagogastroduodenoscopy N/A 08/06/2013    Procedure: ESOPHAGOGASTRODUODENOSCOPY (EGD);  Surgeon: Rogene Houston, MD;  [facility];  Service: Endoscopy;  Laterality: N/A;  1200  . Hot hemostasis N/A 08/06/2013    Procedure: HOT HEMOSTASIS (ARGON PLASMA COAGULATION/BICAP);  Surgeon: Rogene Houston, MD;  [facility];  Service: Endoscopy;  Laterality: N/A;  . Esophagogastroduodenoscopy N/A 10/31/2013    Procedure: ESOPHAGOGASTRODUODENOSCOPY (EGD);  Surgeon: Rogene Houston, MD;  [facility];  Service: Endoscopy;  Laterality: N/A;  125-moved to [place] notified pt  . Hot hemostasis N/A 10/31/2013    Procedure: HOT HEMOSTASIS (ARGON PLASMA COAGULATION/BICAP);  Surgeon: Rogene Houston, MD;  [facility];  Service: Endoscopy;  Laterality: N/A;  . Lumbar fusion  06/25/14    L4 & L5  . Esophagogastroduodenoscopy N/A 10/23/2014    Procedure: ESOPHAGOGASTRODUODENOSCOPY (EGD);  Surgeon: Rogene Houston, MD;  [facility];  Service: Endoscopy;  Laterality: N/A;  1020  . Hot hemostasis N/A  10/23/2014    Procedure: HOT HEMOSTASIS (ARGON PLASMA COAGULATION/BICAP);  Surgeon: Rogene Houston, MD;  [facility];  Service: Endoscopy;  Laterality: N/A;     Current Outpatient Prescriptions  Medication Sig Dispense Refill  . albuterol (PROVENTIL) (2.5 MG/3ML) 0.083% nebulizer solution Take 2.5 mg by nebulization every 6 (six) hours as needed for wheezing or shortness of breath.    Marland Kitchen amLODipine (NORVASC) 2.5 MG tablet TAKE 1 TABLET BY MOUTH EVERY DAY 90 tablet 5  . Cholecalciferol (VITAMIN D-3) 1000 UNITS CAPS Take 1,000 Units by mouth daily.    Marland Kitchen DIGOX 125 MCG tablet TAKE 1 TABLET BY MOUTH EVERY DAY AS DIRECTED DO NOT TAKE ON WEDNESDAY OR SUNDAY 30 tablet 1  . hydrALAZINE (APRESOLINE) 10 MG tablet Take 1 tablet (10 mg total) by mouth 3 (three) times daily. 270 tablet 3  . levothyroxine (SYNTHROID, LEVOTHROID) 112 MCG tablet Take 112 mcg by mouth daily before breakfast.    .  metoprolol tartrate (LOPRESSOR) 25 MG tablet TAKE 1 TABLET BY MOUTH DAILY 90 tablet 1  . pantoprazole (PROTONIX) 40 MG tablet TAKE 1 TABLET BY MOUTH EVERY DAY 90 tablet 0  . polyethylene glycol powder (GLYCOLAX/MIRALAX) powder Take 17 g by mouth daily as needed for moderate constipation.     . temazepam (RESTORIL) 15 MG capsule Take 15 mg by mouth at bedtime.     No current facility-administered medications for this visit.    Allergies:   Avapro; Clarithromycin; Losartan potassium-hctz; Maxzide; Ziac; Penicillins; and Sulfa drugs cross reactors    Social History:  The patient  reports that she has never smoked. She has never used smokeless tobacco. She reports that she does not drink alcohol or use illicit drugs.   Family History:  The patient's family history includes Heart attack in her father; Hypertension in her father and mother.    ROS:  Please see the history of present illness.   Otherwise, review of systems are positive for none.   All other systems are reviewed and negative.    PHYSICAL  EXAM: VS:  BP 142/80 mmHg  Pulse 76  Ht 5\' 1"  (1.549 m)  Wt 144 lb 6.4 oz (65.499 kg)  BMI 27.30 kg/m2 , BMI Body mass index is 27.3 kg/(m^2). GEN: Well nourished, well developed, in no acute distress HEENT: normal Neck: no JVD, carotid bruits, or masses Cardiac: RRR; no murmurs, rubs, or gallops,no edema  Respiratory:  clear to auscultation bilaterally, normal work of breathing GI: soft, nontender, nondistended, + BS MS: no deformity or atrophy Skin: warm and dry, no rash Neuro:  Strength and sensation are intact Psych: euthymic mood, full affect   EKG:  EKG is not ordered today.    Recent Labs: 09/03/2015: ALT 16; BUN 23*; Creatinine, Ser 1.14*; Hemoglobin 11.8*; Platelets 331; Potassium 4.2; Sodium 139    Lipid Panel    Component Value Date/Time   CHOL 144 04/21/2015 1050   TRIG 83.0 04/21/2015 1050   HDL 45.40 04/21/2015 1050   CHOLHDL 3 04/21/2015 1050   VLDL 16.6 04/21/2015 1050   LDLCALC 82 04/21/2015 1050      Wt Readings from Last 3 Encounters:  09/16/15 144 lb 6.4 oz (65.499 kg)  06/11/15 149 lb 3.2 oz (67.677 kg)  04/21/15 146 lb 12.8 oz (66.588 kg)        ASSESSMENT AND PLAN:   1. benign hypertensive heart disease without heart failure. 2. GAVE (gastric antral vascular ectasia) 3. chronic iron deficiency anemia secondary to gastric antral vascular ectasia. 4. pulmonary hypertension 5. stable dyspnea on exertion, multifactorial. 6. Past history of frequent PACs, currently quiescent 7. Weight loss and difficulty chewing because of new dentures  Plan: Continue on same medication. Recheck in 4 months for office visit. after my retirement she would prefer to see Dr. Martinique.  Current medicines are reviewed at length with the patient today.  The patient does not have concerns regarding medicines.  The following changes have been made:  no change  Labs/ tests ordered today include:   Orders Placed This Encounter  Procedures  . Lipid panel  .  Hepatic function panel  . Basic metabolic panel  . CBC with Differential/Platelet  . Digoxin level    his physician: Lab work today is pending. Recheck in 4 months with Dr. Martinique for office visit lipid panel hepatic function panel basal metabolic panel and CBC   Signed, Darlin Coco MD 09/16/2015 9:25 AM    [address]  [address], [place], [place]  [address] Phone: [phone]; Fax: [phone]

## 2015-09-16 NOTE — Patient Instructions (Signed)
Medication Instructions:  Your physician recommends that you continue on your current medications as directed. Please refer to the Current Medication list given to you today.  Labwork: Lp/bmet/hfp/cbc/dig level   Testing/Procedures: none  Follow-Up: Your physician recommends that you schedule a follow-up appointment in: 4 months with fasting labs (lp/bmet/hfp/cbc) with Dr Martinique   If you need a refill on your cardiac medications before your next appointment, please call your pharmacy.

## 2015-09-17 NOTE — Progress Notes (Signed)
Quick Note:  Please report to patient. The recent labs are stable. Continue same medication and careful diet. The hemoglobin is better. It is 12.3. The digoxin level is low. I want her to start taking her digoxin on Wednesdays now. Continue to skip Sunday. ______

## 2015-09-29 DIAGNOSIS — Z23 Encounter for immunization: Secondary | ICD-10-CM | POA: Diagnosis not present

## 2015-10-01 ENCOUNTER — Other Ambulatory Visit: Payer: Self-pay | Admitting: Cardiology

## 2015-10-05 ENCOUNTER — Telehealth: Payer: Self-pay | Admitting: *Deleted

## 2015-10-05 DIAGNOSIS — M4302 Spondylolysis, cervical region: Secondary | ICD-10-CM | POA: Diagnosis not present

## 2015-10-05 DIAGNOSIS — M7071 Other bursitis of hip, right hip: Secondary | ICD-10-CM | POA: Diagnosis not present

## 2015-10-05 DIAGNOSIS — G545 Neuralgic amyotrophy: Secondary | ICD-10-CM | POA: Diagnosis not present

## 2015-10-05 DIAGNOSIS — M47816 Spondylosis without myelopathy or radiculopathy, lumbar region: Secondary | ICD-10-CM | POA: Diagnosis not present

## 2015-10-05 NOTE — Telephone Encounter (Signed)
Notes Recorded by Earvin Hansen on 10/05/2015 at 1:50 PM Spoke with patient and she did make medication change Notes Recorded by Earvin Hansen on 09/17/2015 at 5:00 PM Left message that was sent in [place] [facility] or message that message received

## 2015-10-05 NOTE — Telephone Encounter (Signed)
-----   Message from Darlin Coco, MD sent at 09/17/2015  7:56 AM EDT ----- Please report to patient.  The recent labs are stable. Continue same medication and careful diet.  The hemoglobin is better.  It is 12.3.  The digoxin level is low.  I want her to start taking her digoxin on Wednesdays now.  Continue to skip Sunday.

## 2015-10-17 ENCOUNTER — Other Ambulatory Visit: Payer: Self-pay | Admitting: Cardiology

## 2015-10-19 NOTE — Telephone Encounter (Signed)
Should the patient still be taking this medication everyday except Sunday? Just wanted to clarify as pharmacy is requesting with a sig of take daily except Wednesday and Sunday. Please advise. Thanks, MI

## 2015-10-21 ENCOUNTER — Encounter: Payer: Self-pay | Admitting: Cardiology

## 2015-10-21 ENCOUNTER — Other Ambulatory Visit: Payer: Self-pay | Admitting: *Deleted

## 2015-10-21 MED ORDER — DIGOXIN 125 MCG PO TABS: ORAL_TABLET | ORAL | Status: DC

## 2015-10-25 ENCOUNTER — Encounter: Payer: Self-pay | Admitting: Internal Medicine

## 2015-10-25 ENCOUNTER — Ambulatory Visit (INDEPENDENT_AMBULATORY_CARE_PROVIDER_SITE_OTHER): Payer: Medicare Other | Admitting: Internal Medicine

## 2015-10-25 VITALS — BP 146/80 | HR 71 | Ht 61.0 in | Wt 145.4 lb

## 2015-10-25 DIAGNOSIS — I272 Other secondary pulmonary hypertension: Secondary | ICD-10-CM

## 2015-10-25 DIAGNOSIS — J9611 Chronic respiratory failure with hypoxia: Secondary | ICD-10-CM

## 2015-10-25 NOTE — Patient Instructions (Signed)
Continue 02 2lpm night time only   If you are satisfied with your treatment plan,  let your doctor know and he/she can either refill your medications or you can return here when your prescription runs out.     If in any way you are not 100% satisfied,  please tell us.  If 100% better, tell your friends!  Pulmonary follow up is as needed

## 2015-10-25 NOTE — Progress Notes (Signed)
Subjective:    Patient ID: [name], female    DOB: July 03, 1933  MRN: [id number]    Brief patient profile:  30  yowf never smoker with new onset sob April 2013 referred 09/18/2012 by Dr Tressie Stalker for sob assoc with anemia with evidence of mod PAH but neg w/u except for noct desat including v/q    History of Present Illness  09/18/2012 1st pulmonary ov/ [name] cc indolent onset sob April 2013 gradually worse through summer and into fall to point just across the room but no problems at rest or lying down, much better since dx with fe def anemia and rx with IV iron. rec Schedule echocardiogram > mod PAH We will place your record in a recall file for one year notification ideally you need a  repeat CT scan to complete the evaluation  Late add : ono RA ordered > pos desat    10/17/2012 f/u ov/[name] cc back to baseline in terms of activity tolerance with no limiting sob- attributes this to iron rx. Denies am ha or hypersomnolence rec  titrate up 02  Sleep study > neg for osa   04/02/2013 f/u ov/[name] re PAH adjusted to 1lpm and feels fine on this Chief Complaint  Patient presents with  . Follow-up    Breathing has improved and is doing great. Reports no new acute complaints. Denies chest pain, chest tightness, SOB, coughing or wheezing.  not limited by breathing but 4lpm def caused ha so wasn't taking it that way anyway and doing better on 1lpm. rec 2lpm at hs, no change otherwise     10/25/2015  f/u ov/[name] re: noct hypoxemia with [facility] Chief Complaint  Patient presents with  . Follow-up    pt states she is doing very well, just wanted to follow up. pt states she does still get a little winded on exertion and still using O2 2LPM qhs. no c/o cough, wheezing, or chest tightness. no concerns at this time.    .Not limited by breathing from desired activities    No obvious daytime variabilty or assoc chronic cough or cp or chest tightness, subjective wheeze overt sinus or hb symptoms. No  unusual exp hx or h/o childhood pna/ asthma or premature birth to her knowledge.   Sleeping ok without nocturnal  or early am exacerbation  of respiratory  c/o's or need for noct saba. Also denies any obvious fluctuation of symptoms with weather or environmental changes or other aggravating or alleviating factors except as outlined above   Current Medications, Allergies, Past Medical History, Past Surgical History, Family History, and Social History were reviewed in [employer] record.  ROS  The following are not active complaints unless bolded sore throat, dysphagia, dental problems, itching, sneezing,  nasal congestion or excess/ purulent secretions, ear ache,   fever, chills, sweats, unintended wt loss, pleuritic or exertional cp, hemoptysis,  orthopnea pnd or leg swelling, presyncope, palpitations, heartburn, abdominal pain, anorexia, nausea, vomiting, diarrhea  or change in bowel or urinary habits, change in stools or urine, dysuria,hematuria,  rash, arthralgias, visual complaints, headache, numbness weakness or ataxia or problems with walking or coordination,  change in mood/affect or memory.             Objective:   Physical Exam    Wt 159 10/17/2012  > 01/05/2013  166 > 04/02/2013  162> 161 09/29/2013 >  08/21/2014 154 > 10/25/2015 145     09/23/12 160 lb (72.576 kg)  09/18/12 160 lb 3.2  oz (72.666 kg)  09/12/12 159 lb (72.122 kg)    amb wf nad / all smiles today   HEENT: nl dentition, turbinates, and orophanx. Nl external ear canals without cough reflex   NECK :  without JVD/Nodes/TM/ nl carotid upstrokes bilaterally   LUNGS: no acc muscle use, clear to A and P bilaterally without cough on insp or exp maneuvers   CV:  RRR  no s3 or murmur  slt increase P2 /  Trace sym bilateral lower ext  edema   ABD:  soft and nontender with nl excursion in the supine position. No bruits or organomegaly, bowel sounds nl  MS:  warm without deformities, calf tenderness,  cyanosis or clubbing              Assessment & Plan:   Outpatient Encounter Prescriptions as of 10/25/2015  Medication Sig  . albuterol (PROVENTIL) (2.5 MG/3ML) 0.083% nebulizer solution Take 2.5 mg by nebulization every 6 (six) hours as needed for wheezing or shortness of breath.  Marland Kitchen amLODipine (NORVASC) 2.5 MG tablet TAKE 1 TABLET BY MOUTH EVERY DAY  . Cholecalciferol (VITAMIN D-3) 1000 UNITS CAPS Take 1,000 Units by mouth daily.  . digoxin (DIGOX) 0.125 MG tablet Take 1 tablet mouth daily except none on Sundays or as directed  . hydrALAZINE (APRESOLINE) 10 MG tablet Take 1 tablet (10 mg total) by mouth 3 (three) times daily.  Marland Kitchen levothyroxine (SYNTHROID, LEVOTHROID) 112 MCG tablet Take 112 mcg by mouth daily before breakfast.  . metoprolol tartrate (LOPRESSOR) 25 MG tablet TAKE 1 TABLET BY MOUTH DAILY  . pantoprazole (PROTONIX) 40 MG tablet TAKE 1 TABLET BY MOUTH EVERY DAY  . polyethylene glycol powder (GLYCOLAX/MIRALAX) powder Take 17 g by mouth daily as needed for moderate constipation.   . temazepam (RESTORIL) 15 MG capsule Take 15 mg by mouth at bedtime.   No facility-administered encounter medications on file as of 10/25/2015.

## 2015-10-25 NOTE — Assessment & Plan Note (Signed)
-   Echo 09/24/12 >Left ventricle: The cavity size was normal. Wall thickness was normal. Systolic function was normal. The estimated ejection fraction was in the range of 55% to 60%. Wall motion was normal; there were no regional wall motion abnormalities. - Aortic valve: Mild regurgitation. - Right atrium: The atrium was mildly dilated. - Pulmonary arteries: Systolic pressure was moderately increased. PA peak pressure: 50mm Hg  Vs 47 01/28/14 - ONO  RA rec 10/01/2012  >  sats <= 88%  For 8h 40 min > rec 02 2lpm and repeat study 10/24/12 still destat < 88 for 4: 35m so sleep study ordered. - Sleep study 11/19/12 neg osa but desat > see chronic resp failure - V/Q 04/11/13> low prob - PFT's 09/29/13 FEV1  1.06 (61%) ratio 77 and no change p B2,  DLC0 72 corrects to 102    No clinical evidence of dz progression

## 2015-10-27 NOTE — Assessment & Plan Note (Addendum)
-   Rx = 2lpm started 10/17/2012  Based on ONO  RA rec 10/01/2012  >  sats <= 88%  For 8h 40 min  > 3 h 38 min < 88 @ 3lpm 12/03/12 > rec sleep study > neg osa - 10/17/2012  Walked RA x 3 laps @ 185 ft each stopped due to end of study, sat 90%, no sob - 12/13/12 ONO 4lpm >  1 h 70m 44 sec @ sat < 88 - 04/02/2013  Walked RA x 2laps @ 185 ft each stopped due to  desat 86 - 04/21/13 ONO 2lpm 146.13m at < 88% but cannot tolerate higher settings - 09/29/13 Walked RA  2 laps @ 185 ft each stopped due to desat to 88%  - 02/20/2014  Walked RA x 3 laps @ 185 ft each stopped due to end of study, sats still 90%  - 10/25/2015  Walked RA x 2 laps @ 185 ft each stopped due to sob with sats still 89-90% nl pace     I had an extended final summary discussion with the patient reviewing all relevant studies completed to date and  lasting 15 to 20 minutes of a 25 minute visit on the following issues:    Her exercise tolerance and oxygen saturations have remained consistent for several years now and she is really not limited from doing activities that she enjoys by shortness of breath.  She does need to continue her nighttime oxygen because she has pulmonary hypertension but no need for it during the day at this point.  Pulmonary follow-up can be on an as-needed basis if she does have a decline in exercise tolerance or worsening hypoxemia.

## 2015-11-16 ENCOUNTER — Other Ambulatory Visit (HOSPITAL_COMMUNITY): Payer: Self-pay

## 2015-11-16 DIAGNOSIS — D500 Iron deficiency anemia secondary to blood loss (chronic): Secondary | ICD-10-CM

## 2015-11-17 DIAGNOSIS — E039 Hypothyroidism, unspecified: Secondary | ICD-10-CM | POA: Diagnosis not present

## 2015-11-17 DIAGNOSIS — E782 Mixed hyperlipidemia: Secondary | ICD-10-CM | POA: Diagnosis not present

## 2015-11-17 DIAGNOSIS — I10 Essential (primary) hypertension: Secondary | ICD-10-CM | POA: Diagnosis not present

## 2015-11-17 DIAGNOSIS — E559 Vitamin D deficiency, unspecified: Secondary | ICD-10-CM | POA: Diagnosis not present

## 2015-11-17 DIAGNOSIS — D509 Iron deficiency anemia, unspecified: Secondary | ICD-10-CM | POA: Diagnosis not present

## 2015-11-18 DIAGNOSIS — I499 Cardiac arrhythmia, unspecified: Secondary | ICD-10-CM | POA: Diagnosis not present

## 2015-11-18 DIAGNOSIS — I10 Essential (primary) hypertension: Secondary | ICD-10-CM | POA: Diagnosis not present

## 2015-11-18 DIAGNOSIS — D509 Iron deficiency anemia, unspecified: Secondary | ICD-10-CM | POA: Diagnosis not present

## 2015-11-18 DIAGNOSIS — E039 Hypothyroidism, unspecified: Secondary | ICD-10-CM | POA: Diagnosis not present

## 2015-11-18 DIAGNOSIS — R002 Palpitations: Secondary | ICD-10-CM | POA: Diagnosis not present

## 2015-11-18 DIAGNOSIS — I270 Primary pulmonary hypertension: Secondary | ICD-10-CM | POA: Diagnosis not present

## 2015-11-26 ENCOUNTER — Encounter (HOSPITAL_COMMUNITY): Payer: Medicare Other | Attending: Hematology & Oncology

## 2015-11-26 DIAGNOSIS — D500 Iron deficiency anemia secondary to blood loss (chronic): Secondary | ICD-10-CM

## 2015-11-26 LAB — CBC WITH DIFFERENTIAL/PLATELET
BASOS PCT: 1 %
Basophils Absolute: 0.1 10*3/uL (ref 0.0–0.1)
EOS ABS: 0.1 10*3/uL (ref 0.0–0.7)
EOS PCT: 2 %
HCT: 35.3 % — ABNORMAL LOW (ref 36.0–46.0)
Hemoglobin: 10.8 g/dL — ABNORMAL LOW (ref 12.0–15.0)
Lymphocytes Relative: 16 %
Lymphs Abs: 1.1 10*3/uL (ref 0.7–4.0)
MCH: 26.3 pg (ref 26.0–34.0)
MCHC: 30.6 g/dL (ref 30.0–36.0)
MCV: 85.9 fL (ref 78.0–100.0)
MONO ABS: 0.9 10*3/uL (ref 0.1–1.0)
MONOS PCT: 12 %
NEUTROS PCT: 69 %
Neutro Abs: 5 10*3/uL (ref 1.7–7.7)
PLATELETS: 433 10*3/uL — AB (ref 150–400)
RBC: 4.11 MIL/uL (ref 3.87–5.11)
RDW: 16.6 % — AB (ref 11.5–15.5)
WBC: 7.1 10*3/uL (ref 4.0–10.5)

## 2015-11-26 LAB — IRON AND TIBC
IRON: 18 ug/dL — AB (ref 28–170)
SATURATION RATIOS: 4 % — AB (ref 10.4–31.8)
TIBC: 405 ug/dL (ref 250–450)
UIBC: 387 ug/dL

## 2015-11-26 LAB — FERRITIN: FERRITIN: 9 ng/mL — AB (ref 11–307)

## 2015-11-30 ENCOUNTER — Encounter (HOSPITAL_BASED_OUTPATIENT_CLINIC_OR_DEPARTMENT_OTHER): Payer: Medicare Other

## 2015-11-30 VITALS — BP 131/50 | HR 62 | Temp 97.9°F | Resp 16

## 2015-11-30 DIAGNOSIS — D500 Iron deficiency anemia secondary to blood loss (chronic): Secondary | ICD-10-CM

## 2015-11-30 MED ORDER — SODIUM CHLORIDE 0.9 % IV SOLN
INTRAVENOUS | Status: DC
  Administered 2015-11-30: 14:00:00 via INTRAVENOUS

## 2015-11-30 MED ORDER — SODIUM CHLORIDE 0.9 % IV SOLN
750.0000 mg | Freq: Once | INTRAVENOUS | Status: AC
  Administered 2015-11-30: 750 mg via INTRAVENOUS
  Filled 2015-11-30: qty 100

## 2015-11-30 NOTE — Patient Instructions (Signed)
[place] at [facility] Discharge Instructions  RECOMMENDATIONS MADE BY THE CONSULTANT AND ANY TEST RESULTS WILL BE SENT TO YOUR REFERRING PHYSICIAN.  IV Injectofer today.   Ferric carboxymaltose injection What is this medicine? FERRIC CARBOXYMALTOSE (ferr-ik car-box-ee-mol-toes) is an iron complex. Iron is used to make healthy red blood cells, which carry oxygen and nutrients throughout the body. This medicine is used to treat anemia in people with chronic kidney disease or people who cannot take iron by mouth. This medicine may be used for other purposes; ask your health care provider or pharmacist if you have questions.  What should I tell my health care provider before I take this medicine? They need to know if you have any of these conditions: -anemia not caused by low iron levels -high levels of iron in the blood -liver disease -an unusual or allergic reaction to iron, other medicines, foods, dyes, or preservatives -pregnant or trying to get pregnant -breast-feeding  How should I use this medicine? This medicine is for infusion into a vein. It is given by a health care professional in a hospital or clinic setting. Talk to your pediatrician regarding the use of this medicine in children. Special care may be needed. Overdosage: If you think you have taken too much of this medicine contact a poison control center or emergency room at once. NOTE: This medicine is only for you. Do not share this medicine with others.  What if I miss a dose? It is important not to miss your dose. Call your doctor or health care professional if you are unable to keep an appointment.  What may interact with this medicine? Do not take this medicine with any of the following medications: -deferoxamine -dimercaprol -other iron products This medicine may also interact with the following medications: -chloramphenicol -deferasirox This list may not describe all possible  interactions. Give your health care provider a list of all the medicines, herbs, non-prescription drugs, or dietary supplements you use. Also tell them if you smoke, drink alcohol, or use illegal drugs. Some items may interact with your medicine.  What should I watch for while using this medicine? Visit your doctor or health care professional regularly. Tell your doctor if your symptoms do not start to get better or if they get worse. You may need blood work done while you are taking this medicine. You may need to follow a special diet. Talk to your doctor. Foods that contain iron include: whole grains/cereals, dried fruits, beans, or peas, leafy green vegetables, and organ meats (liver, kidney).  What side effects may I notice from receiving this medicine? Side effects that you should report to your doctor or health care professional as soon as possible: -allergic reactions like skin rash, itching or hives, swelling of the face, lips, or tongue -breathing problems -changes in blood pressure -feeling faint or lightheaded, falls -flushing, sweating, or hot feelings Side effects that usually do not require medical attention (Report these to your doctor or health care professional if they continue or are bothersome.): -changes in taste -constipation -dizziness -headache -nausea -pain, redness, or irritation at site where injected -vomiting This list may not describe all possible side effects. Call your doctor for medical advice about side effects. You may report side effects to FDA at 1-800-FDA-1088.    2016, Elsevier/Gold Standard. (2012-06-14 15:36:34)   Thank you for choosing [place] at [facility] to provide your oncology and hematology care.  To afford each patient quality time  with our provider, please arrive at least 15 minutes before your scheduled appointment time.    You need to re-schedule your appointment should you arrive 10 or more minutes late.  We  strive to give you quality time with our providers, and arriving late affects you and other patients whose appointments are after yours.  Also, if you no show three or more times for appointments you may be dismissed from the clinic at the providers discretion.     Again, thank you for choosing [facility].  Our hope is that these requests will decrease the amount of time that you wait before being seen by our physicians.       _____________________________________________________________  Should you have questions after your visit to [facility], please contact our office at [phone] between the hours of 8:30 a.m. and 4:30 p.m.  Voicemails left after 4:30 p.m. will not be returned until the following business day.  For prescription refill requests, have your pharmacy contact our office.

## 2015-11-30 NOTE — Progress Notes (Signed)
Patient tolerated infusion well.  VSS.   

## 2015-12-07 ENCOUNTER — Ambulatory Visit (HOSPITAL_COMMUNITY): Payer: Medicare Other

## 2015-12-13 ENCOUNTER — Encounter (HOSPITAL_COMMUNITY): Payer: Self-pay | Admitting: Hematology & Oncology

## 2015-12-13 ENCOUNTER — Encounter (HOSPITAL_BASED_OUTPATIENT_CLINIC_OR_DEPARTMENT_OTHER): Payer: Medicare Other | Admitting: Hematology & Oncology

## 2015-12-13 VITALS — BP 148/63 | HR 62 | Resp 18 | Wt 141.3 lb

## 2015-12-13 DIAGNOSIS — K31819 Angiodysplasia of stomach and duodenum without bleeding: Secondary | ICD-10-CM | POA: Diagnosis not present

## 2015-12-13 DIAGNOSIS — D500 Iron deficiency anemia secondary to blood loss (chronic): Secondary | ICD-10-CM

## 2015-12-13 DIAGNOSIS — R634 Abnormal weight loss: Secondary | ICD-10-CM

## 2015-12-13 LAB — CBC WITH DIFFERENTIAL/PLATELET
BASOS ABS: 0.1 10*3/uL (ref 0.0–0.1)
Basophils Relative: 1 %
EOS PCT: 3 %
Eosinophils Absolute: 0.2 10*3/uL (ref 0.0–0.7)
HEMATOCRIT: 41.4 % (ref 36.0–46.0)
HEMOGLOBIN: 12.8 g/dL (ref 12.0–15.0)
LYMPHS PCT: 16 %
Lymphs Abs: 1.4 10*3/uL (ref 0.7–4.0)
MCH: 27.1 pg (ref 26.0–34.0)
MCHC: 30.9 g/dL (ref 30.0–36.0)
MCV: 87.7 fL (ref 78.0–100.0)
MONO ABS: 1.1 10*3/uL — AB (ref 0.1–1.0)
Monocytes Relative: 13 %
NEUTROS ABS: 5.8 10*3/uL (ref 1.7–7.7)
Neutrophils Relative %: 68 %
Platelets: 271 10*3/uL (ref 150–400)
RBC: 4.72 MIL/uL (ref 3.87–5.11)
RDW: 22.8 % — AB (ref 11.5–15.5)
WBC: 8.5 10*3/uL (ref 4.0–10.5)

## 2015-12-13 LAB — FERRITIN: Ferritin: 206 ng/mL (ref 11–307)

## 2015-12-13 NOTE — Progress Notes (Signed)
[name]'s reason for visit today are for labs as scheduled per MD orders.  Venipuncture performed with a 23 gauge butterfly needle to R Antecubital.  Alford Highland Blanchette tolerated venipuncture well and without incident; questions were answered and patient was discharged.

## 2015-12-13 NOTE — Progress Notes (Signed)
Mary Neighbors, MD [place] [address]  Iron deficiency anemia due to chronic blood loss - Plan: CBC with Differential, Ferritin, CBC with Differential, Ferritin, CANCELED: Iron and TIBC  CURRENT THERAPY: IV Feraheme 510 mg on days 1 and 8 every 12 weeks.  INTERVAL HISTORY: [name] 82 y.o. 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  Chart reviewed.  She underwent EGD by Dr. Laural Golden on 10/23/2014 and he reports: Extensive gastric antral Vassar [sic] ectasia along with few at cardia. Majority of these lesions were ablated with argon plasma coagulation.  Ms. Santizo is here alone today.  She is feeling better and sleeping better. 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 She got new teeth one year ago with continued difficulty chewing. She was curious if her weight loss was due to her blood problems or her new teeth. She was previously 186 lbs. She currently weighs 141 lbs.She is scheduled to have her teeth realigned tomorrow. She is hoping she will be able to eat previously enjoyed foods.  She has no issues with receiving IV iron. She was treated with injectafer.    Past Medical History  Diagnosis Date  . Hypertension   . Hypothyroidism   . Palpitations   . Anemia   . GAVE (gastric antral vascular ectasia) 09/12/12  . On home O2     wears 2L/[place] at night  . Iron deficiency anemia 10/15/2012  . Iron deficiency anemia due to chronic blood loss 04/20/2014    has Benign hypertensive heart disease without heart failure; Palpitations; Hypothyroidism; GERD (gastroesophageal reflux disease); Pruritus; Pulmonary hypertension ([place]); Pulmonary nodule; Chronic respiratory failure with hypoxia ([place]); Constipation; Acute posthemorrhagic anemia; GAVE (gastric antral vascular ectasia); Iron deficiency anemia due to chronic blood loss; and Family history of aortic  aneurysm on her problem list.     is allergic to avapro; clarithromycin; losartan potassium-hctz; maxzide; ziac; penicillins; and sulfa drugs cross reactors.  Ms. Eisenhauer does not currently have medications on file.  Past Surgical History  Procedure Laterality Date  . Tonsillectomy and adenoidectomy    . US echocardiography  03/12/2006    EF 55-60%  . Knee replacement rt 3 yrs ago    . Back surger x 2    . Abdominal hysterectomy    . Shoulder surgery    . Colonoscopy  07/24/2012    Procedure: COLONOSCOPY;  Surgeon: Rogene Houston, MD;  [facility];  Service: Endoscopy;  Laterality: N/A;  200  . Heel spur surgery    . Esophagogastroduodenoscopy  09/12/2012    Procedure: ESOPHAGOGASTRODUODENOSCOPY (EGD);  Surgeon: Rogene Houston, MD;  [facility];  Service: Endoscopy;  Laterality: N/A;  325-changed to 200 per Ann-pt moved up to [place] to notify pt  . Total abdominal hysterectomy    . Esophagogastroduodenoscopy  11/01/2012    Procedure: ESOPHAGOGASTRODUODENOSCOPY (EGD);  Surgeon: Rogene Houston, MD;  [facility];  Service: Endoscopy;  Laterality: N/A;  1:20  . Hot hemostasis  11/01/2012    Procedure: HOT HEMOSTASIS (ARGON PLASMA COAGULATION/BICAP);  Surgeon: Rogene Houston, MD;  [facility];  Service: Endoscopy;  Laterality: N/A;  . Esophagogastroduodenoscopy N/A 07/04/2013    Procedure: ESOPHAGOGASTRODUODENOSCOPY (EGD);  Surgeon: Rogene Houston, MD;  [facility];  Service: Endoscopy;  Laterality: N/A;  850  . Hot hemostasis N/A 07/04/2013  Procedure: HOT HEMOSTASIS (ARGON PLASMA COAGULATION/BICAP);  Surgeon: Rogene Houston, MD;  [facility];  Service: Endoscopy;  Laterality: N/A;  . Esophagogastroduodenoscopy N/A 08/06/2013    Procedure: ESOPHAGOGASTRODUODENOSCOPY (EGD);  Surgeon: Rogene Houston, MD;  [facility];  Service: Endoscopy;  Laterality: N/A;  1200  . Hot hemostasis N/A 08/06/2013     Procedure: HOT HEMOSTASIS (ARGON PLASMA COAGULATION/BICAP);  Surgeon: Rogene Houston, MD;  [facility];  Service: Endoscopy;  Laterality: N/A;  . Esophagogastroduodenoscopy N/A 10/31/2013    Procedure: ESOPHAGOGASTRODUODENOSCOPY (EGD);  Surgeon: Rogene Houston, MD;  [facility];  Service: Endoscopy;  Laterality: N/A;  125-moved to [place] notified pt  . Hot hemostasis N/A 10/31/2013    Procedure: HOT HEMOSTASIS (ARGON PLASMA COAGULATION/BICAP);  Surgeon: Rogene Houston, MD;  [facility];  Service: Endoscopy;  Laterality: N/A;  . Lumbar fusion  06/25/14    L4 & L5  . Esophagogastroduodenoscopy N/A 10/23/2014    Procedure: ESOPHAGOGASTRODUODENOSCOPY (EGD);  Surgeon: Rogene Houston, MD;  [facility];  Service: Endoscopy;  Laterality: N/A;  1020  . Hot hemostasis N/A 10/23/2014    Procedure: HOT HEMOSTASIS (ARGON PLASMA COAGULATION/BICAP);  Surgeon: Rogene Houston, MD;  [facility];  Service: Endoscopy;  Laterality: N/A;    Denies any headaches, dizziness, double vision, fevers, chills, night sweats, nausea, vomiting, diarrhea, constipation, chest pain, heart palpitations, shortness of breath, blood in stool, black tarry stool, urinary pain, urinary burning, urinary frequency, hematuria. 14 point review of systems was performed and is negative except as detailed under history of present illness and above   PHYSICAL EXAMINATION  ECOG PERFORMANCE STATUS: 0 - Asymptomatic  Filed Vitals:   12/13/15 1300  BP: 148/63  Pulse: 62  Resp: 18    GENERAL:alert, no distress, well nourished, well developed, comfortable, cooperative and smiling SKIN: skin color, texture, turgor are normal, no rashes or significant lesions HEAD: Normocephalic, No masses, lesions, tenderness or abnormalities EYES: normal, PERRLA, EOMI, Conjunctiva are pink and non-injected EARS: External ears normal OROPHARYNX:lips, buccal mucosa, and tongue normal and  mucous membranes are moist  NECK: supple, no adenopathy, thyroid normal size, non-tender, without nodularity, no stridor, non-tender, trachea midline LYMPH:  no palpable lymphadenopathy BREAST:not examined LUNGS: clear to auscultation and percussion HEART: regular rate & rhythm, no gallops, S1 normal and S2 normal    Murmur heard. ABDOMEN:abdomen soft, non-tender and normal bowel sounds BACK: Back symmetric, no curvature., No CVA tenderness EXTREMITIES:less then 2 second capillary refill, no joint deformities, effusion, or inflammation, no edema, no skin discoloration, no clubbing, no cyanosis  NEURO: alert & oriented x 3 with fluent speech, no focal motor/sensory deficits, gait normal   LABORATORY DATA: I have reviewed the data as listed. CBC    Component Value Date/Time   WBC 8.5 12/13/2015 1412   RBC 4.72 12/13/2015 1412   RBC 4.12 08/20/2012 1725   HGB 12.8 12/13/2015 1412   HCT 41.4 12/13/2015 1412   PLT 271 12/13/2015 1412   MCV 87.7 12/13/2015 1412   MCH 27.1 12/13/2015 1412   MCHC 30.9 12/13/2015 1412   RDW 22.8* 12/13/2015 1412   LYMPHSABS 1.4 12/13/2015 1412   MONOABS 1.1* 12/13/2015 1412   EOSABS 0.2 12/13/2015 1412   BASOSABS 0.1 12/13/2015 1412      Chemistry      Component Value Date/Time   NA 138 09/16/2015 0855   K 3.9 09/16/2015 0855   CL 103  09/16/2015 0855   CO2 27 09/16/2015 0855   BUN 18 09/16/2015 0855   CREATININE 1.07* 09/16/2015 0855   CREATININE 1.14* 09/03/2015 1248      Component Value Date/Time   CALCIUM 9.1 09/16/2015 0855   ALKPHOS 83 09/16/2015 0855   AST 21 09/16/2015 0855   ALT 16 09/16/2015 0855   BILITOT 0.3 09/16/2015 0855     Lab Results  Component Value Date   IRON 18* 11/26/2015   TIBC 405 11/26/2015   FERRITIN 9* 11/26/2015     ASSESSMENT AND PLAN:  Iron deficiency anemia, secondary to chronic GI related blood loss GAVE syndrome Weight loss, unintentional  CBC is improved. We are planning on monitoring a CBC  and ferritin monthly moving forward in order to establish iron needs moving forward. Her ferritin and hgb has been sub-optimal for several months. She notices a significant change in her PS with low iron and H/H.  We will check her CBC and ferritin levels today. She last received iron on 11/30/15.  I will see her again in 6 months for follow up. In the interim, she will return monthly for CBC and ferritin level check.  We will replace her iron IV accordingly.  If needed we can refer her back to Dr. Laural Golden for another EGD.  In regards to her weight loss, if is persists, we can discuss pursuing further evaluation. I suspect however that it is secondary to her new dentures.   Orders Placed This Encounter  Procedures  . CBC with Differential    Standing Status: Standing     Number of Occurrences: 12     Standing Expiration Date: 12/12/2017  . Ferritin    Standing Status: Standing     Number of Occurrences: 12     Standing Expiration Date: 12/12/2017    All questions were answered. The patient knows to call the clinic with any problems, questions or concerns. We can certainly see the patient much sooner if necessary.  This document serves as a record of services personally performed by Ancil Linsey, MD. It was created on her behalf by Arlyce Harman, a trained medical scribe. The creation of this record is based on the scribe's personal observations and the provider's statements to them. This document has been checked and approved by the attending provider.  I have reviewed the above documentation for accuracy and completeness, and I agree with the above.    Molli Hazard, MD  12/13/2015 5:36 PM

## 2015-12-13 NOTE — Patient Instructions (Addendum)
[place] at [facility] Discharge Instructions  RECOMMENDATIONS MADE BY THE CONSULTANT AND ANY TEST RESULTS WILL BE SENT TO YOUR REFERRING PHYSICIAN.   Exam and discussion by Dr Whitney Muse today Labs today If you feel bad in between visits, please call us, we have standing orders to use and we can check your labs at any time. Injectifer only  Labs monthly  Return to see the doctor in 6 months Please call the clinic if you have any questions or concerns    Thank you for choosing [place] at [facility] to provide your oncology and hematology care.  To afford each patient quality time with our provider, please arrive at least 15 minutes before your scheduled appointment time.   Beginning January 23rd 2017 lab work for the [facility] will be done in the  Main lab at [facility] on 1st floor. If you have a lab appointment with the [place] please come in thru the  Main Entrance and check in at the main information desk  You need to re-schedule your appointment should you arrive 10 or more minutes late.  We strive to give you quality time with our providers, and arriving late affects you and other patients whose appointments are after yours.  Also, if you no show three or more times for appointments you may be dismissed from the clinic at the providers discretion.     Again, thank you for choosing [facility].  Our hope is that these requests will decrease the amount of time that you wait before being seen by our physicians.       _____________________________________________________________  Should you have questions after your visit to [facility], please contact our office at [phone] between the hours of 8:30 a.m. and 4:30 p.m.  Voicemails left after 4:30 p.m. will not be returned until the following business day.  For prescription refill requests, have your pharmacy contact our office.

## 2016-01-11 ENCOUNTER — Encounter (HOSPITAL_COMMUNITY): Payer: Medicare Other | Attending: Hematology & Oncology

## 2016-01-11 DIAGNOSIS — D500 Iron deficiency anemia secondary to blood loss (chronic): Secondary | ICD-10-CM | POA: Diagnosis not present

## 2016-01-11 LAB — CBC WITH DIFFERENTIAL/PLATELET
BASOS ABS: 0.1 10*3/uL (ref 0.0–0.1)
Basophils Relative: 1 %
EOS ABS: 0.1 10*3/uL (ref 0.0–0.7)
EOS PCT: 2 %
HCT: 42.6 % (ref 36.0–46.0)
Hemoglobin: 13.4 g/dL (ref 12.0–15.0)
LYMPHS ABS: 0.8 10*3/uL (ref 0.7–4.0)
LYMPHS PCT: 13 %
MCH: 28.6 pg (ref 26.0–34.0)
MCHC: 31.5 g/dL (ref 30.0–36.0)
MCV: 90.8 fL (ref 78.0–100.0)
MONO ABS: 0.6 10*3/uL (ref 0.1–1.0)
Monocytes Relative: 10 %
Neutro Abs: 4.5 10*3/uL (ref 1.7–7.7)
Neutrophils Relative %: 74 %
PLATELETS: 260 10*3/uL (ref 150–400)
RBC: 4.69 MIL/uL (ref 3.87–5.11)
RDW: 22 % — AB (ref 11.5–15.5)
WBC: 6.1 10*3/uL (ref 4.0–10.5)

## 2016-01-11 LAB — FERRITIN: FERRITIN: 23 ng/mL (ref 11–307)

## 2016-01-14 ENCOUNTER — Ambulatory Visit: Payer: Medicare Other | Admitting: Cardiology

## 2016-02-08 ENCOUNTER — Encounter (HOSPITAL_COMMUNITY): Payer: Medicare Other | Attending: Hematology & Oncology

## 2016-02-08 DIAGNOSIS — D500 Iron deficiency anemia secondary to blood loss (chronic): Secondary | ICD-10-CM | POA: Insufficient documentation

## 2016-02-08 LAB — CBC WITH DIFFERENTIAL/PLATELET
BASOS PCT: 1 %
Basophils Absolute: 0 10*3/uL (ref 0.0–0.1)
EOS PCT: 2 %
Eosinophils Absolute: 0.1 10*3/uL (ref 0.0–0.7)
HEMATOCRIT: 42.3 % (ref 36.0–46.0)
Hemoglobin: 13.6 g/dL (ref 12.0–15.0)
LYMPHS PCT: 13 %
Lymphs Abs: 0.8 10*3/uL (ref 0.7–4.0)
MCH: 29.2 pg (ref 26.0–34.0)
MCHC: 32.2 g/dL (ref 30.0–36.0)
MCV: 90.8 fL (ref 78.0–100.0)
MONO ABS: 0.6 10*3/uL (ref 0.1–1.0)
MONOS PCT: 11 %
NEUTROS ABS: 4.5 10*3/uL (ref 1.7–7.7)
Neutrophils Relative %: 75 %
PLATELETS: 280 10*3/uL (ref 150–400)
RBC: 4.66 MIL/uL (ref 3.87–5.11)
RDW: 20 % — AB (ref 11.5–15.5)
WBC: 6 10*3/uL (ref 4.0–10.5)

## 2016-02-08 LAB — FERRITIN: Ferritin: 23 ng/mL (ref 11–307)

## 2016-02-29 DIAGNOSIS — I10 Essential (primary) hypertension: Secondary | ICD-10-CM | POA: Diagnosis not present

## 2016-02-29 DIAGNOSIS — E039 Hypothyroidism, unspecified: Secondary | ICD-10-CM | POA: Diagnosis not present

## 2016-02-29 DIAGNOSIS — K219 Gastro-esophageal reflux disease without esophagitis: Secondary | ICD-10-CM | POA: Diagnosis not present

## 2016-02-29 DIAGNOSIS — I270 Primary pulmonary hypertension: Secondary | ICD-10-CM | POA: Diagnosis not present

## 2016-03-09 ENCOUNTER — Other Ambulatory Visit: Payer: Self-pay | Admitting: *Deleted

## 2016-03-09 ENCOUNTER — Other Ambulatory Visit: Payer: Self-pay

## 2016-03-09 DIAGNOSIS — Z1231 Encounter for screening mammogram for malignant neoplasm of breast: Secondary | ICD-10-CM

## 2016-03-09 MED ORDER — METOPROLOL TARTRATE 25 MG PO TABS: 25.0000 mg | ORAL_TABLET | Freq: Every day | ORAL | Status: DC

## 2016-03-10 ENCOUNTER — Encounter (HOSPITAL_COMMUNITY): Payer: Medicare Other | Attending: Hematology & Oncology

## 2016-03-10 DIAGNOSIS — D500 Iron deficiency anemia secondary to blood loss (chronic): Secondary | ICD-10-CM | POA: Insufficient documentation

## 2016-03-10 LAB — CBC WITH DIFFERENTIAL/PLATELET
BASOS ABS: 0 10*3/uL (ref 0.0–0.1)
Basophils Relative: 1 %
EOS PCT: 2 %
Eosinophils Absolute: 0.1 10*3/uL (ref 0.0–0.7)
HCT: 38.4 % (ref 36.0–46.0)
Hemoglobin: 12.4 g/dL (ref 12.0–15.0)
LYMPHS PCT: 11 %
Lymphs Abs: 0.7 10*3/uL (ref 0.7–4.0)
MCH: 29.7 pg (ref 26.0–34.0)
MCHC: 32.3 g/dL (ref 30.0–36.0)
MCV: 92.1 fL (ref 78.0–100.0)
MONO ABS: 0.9 10*3/uL (ref 0.1–1.0)
MONOS PCT: 13 %
Neutro Abs: 5.1 10*3/uL (ref 1.7–7.7)
Neutrophils Relative %: 73 %
PLATELETS: 310 10*3/uL (ref 150–400)
RBC: 4.17 MIL/uL (ref 3.87–5.11)
RDW: 15.2 % (ref 11.5–15.5)
WBC: 6.9 10*3/uL (ref 4.0–10.5)

## 2016-03-10 LAB — FERRITIN: Ferritin: 12 ng/mL (ref 11–307)

## 2016-03-14 ENCOUNTER — Other Ambulatory Visit (HOSPITAL_COMMUNITY): Payer: Self-pay | Admitting: Oncology

## 2016-03-14 ENCOUNTER — Encounter: Payer: Self-pay | Admitting: Cardiology

## 2016-03-14 ENCOUNTER — Ambulatory Visit (INDEPENDENT_AMBULATORY_CARE_PROVIDER_SITE_OTHER): Payer: Medicare Other | Admitting: Cardiology

## 2016-03-14 VITALS — BP 132/86 | HR 72 | Ht 61.0 in | Wt 135.6 lb

## 2016-03-14 DIAGNOSIS — K31819 Angiodysplasia of stomach and duodenum without bleeding: Secondary | ICD-10-CM

## 2016-03-14 DIAGNOSIS — Z8249 Family history of ischemic heart disease and other diseases of the circulatory system: Secondary | ICD-10-CM

## 2016-03-14 DIAGNOSIS — I119 Hypertensive heart disease without heart failure: Secondary | ICD-10-CM | POA: Diagnosis not present

## 2016-03-14 DIAGNOSIS — D500 Iron deficiency anemia secondary to blood loss (chronic): Secondary | ICD-10-CM

## 2016-03-14 NOTE — Patient Instructions (Signed)
Continue your current therapy  I will see you in 6 months.   

## 2016-03-15 NOTE — Progress Notes (Signed)
Cardiology Office Note   Date:  03/15/2016   ID:  [name], [name] [birth year], MRN [id number]  PCP:  Wende Neighbors, MD  Cardiologist: Peter Martinique MD  Chief Complaint  Patient presents with  . Follow-up    4 months  pt c/o swelling in legs/ankles during the day, better in the mornings.      History of Present Illness: [name] is a 82 y.o. female who presents for follow up HTN and PACs. She is a former patient of Dr. Mare Ferrari.  She is a retired Marine scientist.  She has a past history of essential hypertension and a history of frequent premature atrial beats. She had a nuclear stress test on 12/21/11. She was found to have no ischemia and her ejection fraction was 83% by Myoview.  The patient had an echocardiogram on 09/24/12 which showed normal left ventricular systolic function with an ejection fraction of 55-60%. She had moderate pulmonary hypertension with a pulmonary artery pressure of 54 and she has had a subsequent sleep study and is being followed by pulmonary. She had an updated echocardiogram on 01/29/14 which showed an ejection fraction of 55-60% with grade 2 diastolic dysfunction and a pulmonary artery pressure of 47 which was down from a previous level of 54. There was mild aortic insufficiency. She is still using oxygen 2 L per minute at night but does not have to use it during the day when she is more active. She has a history of GAVE. She had an endoscopy in December 2015. Her Hgb has been maintained with periodic iron infusions.  She has a family history of aortic aneurysm. A CT of the chest/abdomen October 2015 showed no evidence of aneurysm.  On follow up today she is doing well. 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 She has some chronic ankle swelling that goes down at night. No significant chest pain or dyspnea.    Past Medical History  Diagnosis Date  . Hypertension   . Hypothyroidism   . Palpitations   . Anemia   .  GAVE (gastric antral vascular ectasia) 09/12/12  . On home O2     wears 2L/[place] at night  . Iron deficiency anemia 10/15/2012  . Iron deficiency anemia due to chronic blood loss 04/20/2014    Past Surgical History  Procedure Laterality Date  . Tonsillectomy and adenoidectomy    . US echocardiography  03/12/2006    EF 55-60%  . Knee replacement rt 3 yrs ago    . Back surger x 2    . Abdominal hysterectomy    . Shoulder surgery    . Colonoscopy  07/24/2012    Procedure: COLONOSCOPY;  Surgeon: Rogene Houston, MD;  [facility];  Service: Endoscopy;  Laterality: N/A;  200  . Heel spur surgery    . Esophagogastroduodenoscopy  09/12/2012    Procedure: ESOPHAGOGASTRODUODENOSCOPY (EGD);  Surgeon: Rogene Houston, MD;  [facility];  Service: Endoscopy;  Laterality: N/A;  325-changed to 200 per Ann-pt moved up to [place] to notify pt  . Total abdominal hysterectomy    . Esophagogastroduodenoscopy  11/01/2012    Procedure: ESOPHAGOGASTRODUODENOSCOPY (EGD);  Surgeon: Rogene Houston, MD;  [facility];  Service: Endoscopy;  Laterality: N/A;  1:20  . Hot hemostasis  11/01/2012    Procedure: HOT HEMOSTASIS (ARGON PLASMA COAGULATION/BICAP);  Surgeon: Rogene Houston, MD;  [facility];  Service: Endoscopy;  Laterality: N/A;  . Esophagogastroduodenoscopy N/A 07/04/2013    Procedure: ESOPHAGOGASTRODUODENOSCOPY (EGD);  Surgeon: Rogene Houston, MD;  [facility];  Service: Endoscopy;  Laterality: N/A;  850  . Hot hemostasis N/A 07/04/2013    Procedure: HOT HEMOSTASIS (ARGON PLASMA COAGULATION/BICAP);  Surgeon: Rogene Houston, MD;  [facility];  Service: Endoscopy;  Laterality: N/A;  . Esophagogastroduodenoscopy N/A 08/06/2013    Procedure: ESOPHAGOGASTRODUODENOSCOPY (EGD);  Surgeon: Rogene Houston, MD;  [facility];  Service: Endoscopy;  Laterality: N/A;  1200  . Hot hemostasis N/A 08/06/2013    Procedure: HOT HEMOSTASIS (ARGON  PLASMA COAGULATION/BICAP);  Surgeon: Rogene Houston, MD;  [facility];  Service: Endoscopy;  Laterality: N/A;  . Esophagogastroduodenoscopy N/A 10/31/2013    Procedure: ESOPHAGOGASTRODUODENOSCOPY (EGD);  Surgeon: Rogene Houston, MD;  [facility];  Service: Endoscopy;  Laterality: N/A;  125-moved to [place] notified pt  . Hot hemostasis N/A 10/31/2013    Procedure: HOT HEMOSTASIS (ARGON PLASMA COAGULATION/BICAP);  Surgeon: Rogene Houston, MD;  [facility];  Service: Endoscopy;  Laterality: N/A;  . Lumbar fusion  06/25/14    L4 & L5  . Esophagogastroduodenoscopy N/A 10/23/2014    Procedure: ESOPHAGOGASTRODUODENOSCOPY (EGD);  Surgeon: Rogene Houston, MD;  [facility];  Service: Endoscopy;  Laterality: N/A;  1020  . Hot hemostasis N/A 10/23/2014    Procedure: HOT HEMOSTASIS (ARGON PLASMA COAGULATION/BICAP);  Surgeon: Rogene Houston, MD;  [facility];  Service: Endoscopy;  Laterality: N/A;     Current Outpatient Prescriptions  Medication Sig Dispense Refill  . albuterol (PROVENTIL) (2.5 MG/3ML) 0.083% nebulizer solution Take 2.5 mg by nebulization every 6 (six) hours as needed for wheezing or shortness of breath.    Marland Kitchen amLODipine (NORVASC) 2.5 MG tablet TAKE 1 TABLET BY MOUTH EVERY DAY 90 tablet 5  . Cholecalciferol (VITAMIN D-3) 1000 UNITS CAPS Take 1,000 Units by mouth daily.    . digoxin (DIGOX) 0.125 MG tablet Take 1 tablet mouth daily except none on Sundays or as directed 30 tablet 5  . hydrALAZINE (APRESOLINE) 10 MG tablet Take 1 tablet (10 mg total) by mouth 3 (three) times daily. 270 tablet 3  . levothyroxine (SYNTHROID, LEVOTHROID) 112 MCG tablet Take 112 mcg by mouth daily before breakfast.    . metoprolol tartrate (LOPRESSOR) 25 MG tablet Take 1 tablet (25 mg total) by mouth daily. 90 tablet 1  . pantoprazole (PROTONIX) 40 MG tablet TAKE 1 TABLET BY MOUTH EVERY DAY 90 tablet 3  . polyethylene glycol powder  (GLYCOLAX/MIRALAX) powder Take 17 g by mouth daily as needed for moderate constipation.     . temazepam (RESTORIL) 15 MG capsule Take 15 mg by mouth at bedtime.     No current facility-administered medications for this visit.    Allergies:   Avapro; Clarithromycin; Losartan potassium-hctz; Maxzide; Ziac; Penicillins; and Sulfa drugs cross reactors    Social History:  The patient  reports that she has never smoked. She has never used smokeless tobacco. She reports that she does not drink alcohol or use illicit drugs.   Family History:  The patient's family history includes Heart attack in her father; Hypertension in her father and mother.    ROS:  Please see the history of present illness.   Otherwise, review of systems are positive for none.   All other systems are reviewed and negative.    PHYSICAL EXAM: VS:  BP 132/86 mmHg  Pulse 72  Ht 5\' 1"  (1.549 m)  Wt 61.508 kg (135 lb 9.6 oz)  BMI 25.63 kg/m2 , BMI Body mass index is 25.63 kg/(m^2). GEN: Well nourished, well developed, in no acute distress HEENT: normal Neck: no JVD, carotid bruits, or masses Cardiac: RRR; no murmurs, rubs, or gallops,no edema  Respiratory:  clear to auscultation bilaterally, normal work of breathing GI: soft, nontender, nondistended, + BS MS: no deformity or atrophy Skin: warm and dry, no rash Neuro:  Strength and sensation are intact Psych: euthymic mood, full affect   EKG:  EKG is not ordered today.    Recent Labs: 09/16/2015: ALT 16; BUN 18; Creat 1.07*; Potassium 3.9; Sodium 138 03/10/2016: Hemoglobin 12.4; Platelets 310    Lipid Panel    Component Value Date/Time   CHOL 146 09/16/2015 0855   TRIG 106 09/16/2015 0855   HDL 48 09/16/2015 0855   CHOLHDL 3.0 09/16/2015 0855   VLDL 21 09/16/2015 0855   LDLCALC 77 09/16/2015 0855      Wt Readings from Last 3 Encounters:  03/14/16 61.508 kg (135 lb 9.6 oz)  12/13/15 64.093 kg (141 lb 4.8 oz)  10/25/15 65.953 kg (145 lb 6.4 oz)      Labs reviewed from 11/27/15: Hgb 11.0. TSH normal. Cholesterol 130, trig-92, HDL 48, LDL 64. Dig level low in October. Most recent Hgb 12.4.    ASSESSMENT AND PLAN:   1. Benign hypertensive heart disease without heart failure. 2. GAVE (gastric antral vascular ectasia) 3. Chronic iron deficiency anemia secondary to gastric antral vascular ectasia. 4. Pulmonary hypertension 5. Past history of frequent PACs, well controlled with Dig and metoprolol.   Plan: Continue on same medication. Recheck in 6 months for office visit.  Current medicines are reviewed at length with the patient today.  The patient does not have concerns regarding medicines.  The following changes have been made:  no change  Labs/ tests ordered today include: none  No orders of the defined types were placed in this encounter.      Signed, Peter Martinique MD, [facility]    03/15/2016 9:20 AM    [place]

## 2016-03-17 ENCOUNTER — Encounter (HOSPITAL_COMMUNITY): Payer: Self-pay

## 2016-03-17 ENCOUNTER — Encounter (HOSPITAL_COMMUNITY): Payer: Medicare Other | Attending: Hematology & Oncology

## 2016-03-17 VITALS — BP 140/49 | HR 56 | Temp 97.8°F | Resp 18

## 2016-03-17 DIAGNOSIS — K31819 Angiodysplasia of stomach and duodenum without bleeding: Secondary | ICD-10-CM

## 2016-03-17 DIAGNOSIS — D500 Iron deficiency anemia secondary to blood loss (chronic): Secondary | ICD-10-CM | POA: Diagnosis present

## 2016-03-17 MED ORDER — SODIUM CHLORIDE 0.9 % IV SOLN
INTRAVENOUS | Status: DC
  Administered 2016-03-17: 14:00:00 via INTRAVENOUS

## 2016-03-17 MED ORDER — SODIUM CHLORIDE 0.9 % IV SOLN
510.0000 mg | Freq: Once | INTRAVENOUS | Status: AC
  Administered 2016-03-17: 510 mg via INTRAVENOUS
  Filled 2016-03-17: qty 17

## 2016-03-17 NOTE — Patient Instructions (Signed)
IV iron today 

## 2016-03-21 ENCOUNTER — Ambulatory Visit (HOSPITAL_COMMUNITY): Payer: Medicare Other

## 2016-03-28 DIAGNOSIS — M4316 Spondylolisthesis, lumbar region: Secondary | ICD-10-CM | POA: Diagnosis not present

## 2016-03-28 DIAGNOSIS — G545 Neuralgic amyotrophy: Secondary | ICD-10-CM | POA: Diagnosis not present

## 2016-03-28 DIAGNOSIS — M7071 Other bursitis of hip, right hip: Secondary | ICD-10-CM | POA: Diagnosis not present

## 2016-03-30 ENCOUNTER — Ambulatory Visit
Admission: RE | Admit: 2016-03-30 | Discharge: 2016-03-30 | Disposition: A | Discharge status: Home / Self Care | Payer: Medicare Other | Source: Ambulatory Visit

## 2016-03-30 DIAGNOSIS — Z1231 Encounter for screening mammogram for malignant neoplasm of breast: Secondary | ICD-10-CM | POA: Diagnosis not present

## 2016-04-11 ENCOUNTER — Encounter (HOSPITAL_COMMUNITY): Payer: Medicare Other

## 2016-04-11 DIAGNOSIS — D500 Iron deficiency anemia secondary to blood loss (chronic): Secondary | ICD-10-CM

## 2016-04-11 LAB — CBC WITH DIFFERENTIAL/PLATELET
Basophils Absolute: 0.1 10*3/uL (ref 0.0–0.1)
Basophils Relative: 1 %
EOS PCT: 3 %
Eosinophils Absolute: 0.2 10*3/uL (ref 0.0–0.7)
HCT: 38.9 % (ref 36.0–46.0)
HEMOGLOBIN: 12.4 g/dL (ref 12.0–15.0)
LYMPHS ABS: 0.8 10*3/uL (ref 0.7–4.0)
LYMPHS PCT: 12 %
MCH: 30.8 pg (ref 26.0–34.0)
MCHC: 31.9 g/dL (ref 30.0–36.0)
MCV: 96.5 fL (ref 78.0–100.0)
MONOS PCT: 12 %
Monocytes Absolute: 0.8 10*3/uL (ref 0.1–1.0)
NEUTROS PCT: 72 %
Neutro Abs: 4.8 10*3/uL (ref 1.7–7.7)
Platelets: 285 10*3/uL (ref 150–400)
RBC: 4.03 MIL/uL (ref 3.87–5.11)
RDW: 16.7 % — ABNORMAL HIGH (ref 11.5–15.5)
WBC: 6.7 10*3/uL (ref 4.0–10.5)

## 2016-04-11 LAB — FERRITIN: Ferritin: 38 ng/mL (ref 11–307)

## 2016-04-14 ENCOUNTER — Other Ambulatory Visit (HOSPITAL_COMMUNITY): Payer: Self-pay | Admitting: Oncology

## 2016-04-18 ENCOUNTER — Encounter (HOSPITAL_COMMUNITY): Payer: Medicare Other | Attending: Hematology & Oncology

## 2016-04-18 ENCOUNTER — Encounter (HOSPITAL_COMMUNITY): Payer: Self-pay

## 2016-04-18 VITALS — BP 129/51 | HR 57 | Temp 97.6°F | Resp 18

## 2016-04-18 DIAGNOSIS — D500 Iron deficiency anemia secondary to blood loss (chronic): Secondary | ICD-10-CM | POA: Insufficient documentation

## 2016-04-18 MED ORDER — SODIUM CHLORIDE 0.9 % IV SOLN
510.0000 mg | Freq: Once | INTRAVENOUS | Status: AC
  Administered 2016-04-18: 510 mg via INTRAVENOUS
  Filled 2016-04-18: qty 17

## 2016-04-18 MED ORDER — SODIUM CHLORIDE 0.9 % IV SOLN
Freq: Once | INTRAVENOUS | Status: AC
  Administered 2016-04-18: 11:00:00 via INTRAVENOUS

## 2016-04-18 NOTE — Patient Instructions (Signed)
[facility] at [facility] Discharge Instructions  RECOMMENDATIONS MADE BY THE CONSULTANT AND ANY TEST RESULTS WILL BE SENT TO YOUR REFERRING PHYSICIAN.  Iron infusion today. Return as scheduled for lab work and office visit.  Thank you for choosing  Cancer Center at [facility] to provide your oncology and hematology care.  To afford each patient quality time with our provider, please arrive at least 15 minutes before your scheduled appointment time.   Beginning January 23rd 2017 lab work for the Cancer Center will be done in the  Main lab at [facility] on 1st floor. If you have a lab appointment with the Cancer Center please come in thru the  Main Entrance and check in at the main information desk  You need to re-schedule your appointment should you arrive 10 or more minutes late.  We strive to give you quality time with our providers, and arriving late affects you and other patients whose appointments are after yours.  Also, if you no show three or more times for appointments you may be dismissed from the clinic at the providers discretion.     Again, thank you for choosing [facility].  Our hope is that these requests will decrease the amount of time that you wait before being seen by our physicians.       _____________________________________________________________  Should you have questions after your visit to [facility], please contact our office at [phone] between the hours of 8:30 a.m. and 4:30 p.m.  Voicemails left after 4:30 p.m. will not be returned until the following business day.  For prescription refill requests, have your pharmacy contact our office.         Resources For Cancer Patients and their Caregivers ? American Cancer Society: Can assist with transportation, wigs, general needs, runs Look Good Feel Better.        1-888-227-6333 ? Cancer Care: Provides financial assistance, online  support groups, medication/co-pay assistance.  1-800-813-HOPE (4673) ? [facility] Assists [place] cancer patients and their families through emotional , educational and financial support.  336-427-4357 ? [place] DSS Where to apply for food stamps, Medicaid and utility assistance. 336-342-1394 ? [facility]: Transportation to medical appointments. 336-347-2287 ? Social Security Administration: May apply for disability if have a Stage IV cancer. 336-342-7796 1-800-772-1213 ? [place] Aging, Disability and Transit Services: Assists with nutrition, care and transit needs. 336-349-2343  Cancer Center Support Programs: [email or web address]@ > Cancer Support Group  2nd Tuesday of the month 1pm-2pm, Journey Room  > Creative Journey  3rd Tuesday of the month 1130am-1pm, Journey Room  > Look Good Feel Better  1st Wednesday of the month 10am-12 noon, Journey Room (Call American Cancer Society to register 1-800-395-5775)   

## 2016-05-12 ENCOUNTER — Encounter (HOSPITAL_COMMUNITY): Payer: Medicare Other

## 2016-05-12 DIAGNOSIS — D500 Iron deficiency anemia secondary to blood loss (chronic): Secondary | ICD-10-CM | POA: Diagnosis not present

## 2016-05-12 LAB — CBC WITH DIFFERENTIAL/PLATELET
BASOS ABS: 0.1 10*3/uL (ref 0.0–0.1)
BASOS PCT: 1 %
Eosinophils Absolute: 0.2 10*3/uL (ref 0.0–0.7)
Eosinophils Relative: 2 %
HEMATOCRIT: 39.5 % (ref 36.0–46.0)
HEMOGLOBIN: 12.8 g/dL (ref 12.0–15.0)
Lymphocytes Relative: 9 %
Lymphs Abs: 0.8 10*3/uL (ref 0.7–4.0)
MCH: 32.1 pg (ref 26.0–34.0)
MCHC: 32.4 g/dL (ref 30.0–36.0)
MCV: 99 fL (ref 78.0–100.0)
MONOS PCT: 11 %
Monocytes Absolute: 0.9 10*3/uL (ref 0.1–1.0)
NEUTROS ABS: 6.3 10*3/uL (ref 1.7–7.7)
NEUTROS PCT: 77 %
Platelets: 288 10*3/uL (ref 150–400)
RBC: 3.99 MIL/uL (ref 3.87–5.11)
RDW: 17.2 % — ABNORMAL HIGH (ref 11.5–15.5)
WBC: 8.2 10*3/uL (ref 4.0–10.5)

## 2016-05-12 LAB — FERRITIN: Ferritin: 63 ng/mL (ref 11–307)

## 2016-05-18 DIAGNOSIS — E039 Hypothyroidism, unspecified: Secondary | ICD-10-CM | POA: Diagnosis not present

## 2016-05-18 DIAGNOSIS — I10 Essential (primary) hypertension: Secondary | ICD-10-CM | POA: Diagnosis not present

## 2016-05-18 DIAGNOSIS — E782 Mixed hyperlipidemia: Secondary | ICD-10-CM | POA: Diagnosis not present

## 2016-05-22 DIAGNOSIS — I270 Primary pulmonary hypertension: Secondary | ICD-10-CM | POA: Diagnosis not present

## 2016-05-22 DIAGNOSIS — I10 Essential (primary) hypertension: Secondary | ICD-10-CM | POA: Diagnosis not present

## 2016-05-22 DIAGNOSIS — E039 Hypothyroidism, unspecified: Secondary | ICD-10-CM | POA: Diagnosis not present

## 2016-05-22 DIAGNOSIS — D509 Iron deficiency anemia, unspecified: Secondary | ICD-10-CM | POA: Diagnosis not present

## 2016-05-22 DIAGNOSIS — R944 Abnormal results of kidney function studies: Secondary | ICD-10-CM | POA: Diagnosis not present

## 2016-05-22 DIAGNOSIS — I499 Cardiac arrhythmia, unspecified: Secondary | ICD-10-CM | POA: Diagnosis not present

## 2016-05-23 ENCOUNTER — Other Ambulatory Visit: Payer: Self-pay | Admitting: *Deleted

## 2016-05-23 MED ORDER — HYDRALAZINE HCL 10 MG PO TABS: 10.0000 mg | ORAL_TABLET | Freq: Three times a day (TID) | ORAL | Status: DC

## 2016-06-08 ENCOUNTER — Other Ambulatory Visit (HOSPITAL_COMMUNITY)
Admission: RE | Admit: 2016-06-08 | Discharge: 2016-06-08 | Disposition: A | Discharge status: Home / Self Care | Payer: Medicare Other | Source: Other Acute Inpatient Hospital | Attending: Internal Medicine | Admitting: Internal Medicine

## 2016-06-08 ENCOUNTER — Encounter (HOSPITAL_COMMUNITY): Payer: Medicare Other | Attending: Hematology & Oncology

## 2016-06-08 DIAGNOSIS — I10 Essential (primary) hypertension: Secondary | ICD-10-CM | POA: Diagnosis not present

## 2016-06-08 DIAGNOSIS — D500 Iron deficiency anemia secondary to blood loss (chronic): Secondary | ICD-10-CM | POA: Insufficient documentation

## 2016-06-08 DIAGNOSIS — E039 Hypothyroidism, unspecified: Secondary | ICD-10-CM | POA: Insufficient documentation

## 2016-06-08 LAB — COMPREHENSIVE METABOLIC PANEL
ALT: 22 U/L (ref 14–54)
AST: 26 U/L (ref 15–41)
Albumin: 3.3 g/dL — ABNORMAL LOW (ref 3.5–5.0)
Alkaline Phosphatase: 80 U/L (ref 38–126)
Anion gap: 7 (ref 5–15)
BUN: 18 mg/dL (ref 6–20)
CHLORIDE: 102 mmol/L (ref 101–111)
CO2: 28 mmol/L (ref 22–32)
Calcium: 9 mg/dL (ref 8.9–10.3)
Creatinine, Ser: 1 mg/dL (ref 0.44–1.00)
GFR, EST AFRICAN AMERICAN: 60 mL/min — AB (ref >60)
GFR, EST NON AFRICAN AMERICAN: 51 mL/min — AB (ref >60)
Glucose, Bld: 81 mg/dL (ref 65–99)
POTASSIUM: 4.2 mmol/L (ref 3.5–5.1)
SODIUM: 137 mmol/L (ref 135–145)
Total Bilirubin: 0.4 mg/dL (ref 0.3–1.2)
Total Protein: 6.4 g/dL — ABNORMAL LOW (ref 6.5–8.1)

## 2016-06-08 LAB — CBC WITH DIFFERENTIAL/PLATELET
BASOS PCT: 1 %
Basophils Absolute: 0.1 10*3/uL (ref 0.0–0.1)
EOS ABS: 0.2 10*3/uL (ref 0.0–0.7)
Eosinophils Relative: 3 %
HEMATOCRIT: 36.8 % (ref 36.0–46.0)
HEMOGLOBIN: 12.5 g/dL (ref 12.0–15.0)
Lymphocytes Relative: 13 %
Lymphs Abs: 1 10*3/uL (ref 0.7–4.0)
MCH: 33.1 pg (ref 26.0–34.0)
MCHC: 34 g/dL (ref 30.0–36.0)
MCV: 97.4 fL (ref 78.0–100.0)
MONO ABS: 1 10*3/uL (ref 0.1–1.0)
MONOS PCT: 13 %
NEUTROS ABS: 5.4 10*3/uL (ref 1.7–7.7)
Neutrophils Relative %: 70 %
Platelets: 314 10*3/uL (ref 150–400)
RBC: 3.78 MIL/uL — ABNORMAL LOW (ref 3.87–5.11)
RDW: 14.8 % (ref 11.5–15.5)
WBC: 7.6 10*3/uL (ref 4.0–10.5)

## 2016-06-08 LAB — T4, FREE: FREE T4: 1.59 ng/dL — AB (ref 0.61–1.12)

## 2016-06-08 LAB — FERRITIN: Ferritin: 19 ng/mL (ref 11–307)

## 2016-06-08 LAB — TSH: TSH: 0.863 u[IU]/mL (ref 0.350–4.500)

## 2016-06-12 ENCOUNTER — Encounter (HOSPITAL_COMMUNITY): Payer: Self-pay | Admitting: Oncology

## 2016-06-12 ENCOUNTER — Ambulatory Visit (HOSPITAL_COMMUNITY): Payer: PRIVATE HEALTH INSURANCE | Admitting: Oncology

## 2016-06-12 ENCOUNTER — Encounter (HOSPITAL_BASED_OUTPATIENT_CLINIC_OR_DEPARTMENT_OTHER): Payer: Medicare Other | Admitting: Oncology

## 2016-06-12 VITALS — BP 151/64 | HR 65 | Temp 97.6°F | Resp 18 | Wt 132.8 lb

## 2016-06-12 DIAGNOSIS — K31819 Angiodysplasia of stomach and duodenum without bleeding: Secondary | ICD-10-CM

## 2016-06-12 DIAGNOSIS — D500 Iron deficiency anemia secondary to blood loss (chronic): Secondary | ICD-10-CM

## 2016-06-12 DIAGNOSIS — R634 Abnormal weight loss: Secondary | ICD-10-CM | POA: Diagnosis not present

## 2016-06-12 NOTE — Progress Notes (Signed)
Mary Neighbors, MD [place] [address]  Iron deficiency anemia due to chronic blood loss - Plan: CANCELED: CBC, CANCELED: Ferritin  Weight loss, unintentional - Plan: C-reactive protein, Sedimentation rate, Lactate dehydrogenase  GAVE (gastric antral vascular ectasia)  CURRENT THERAPY: IV iron when indicated.  INTERVAL HISTORY: [name] 82 y.o. female returns for followup of severe iron deficiency anemia secondary to GAVE Syndrome  She notes increased SOB with exertion which is her symptom when she is iron deficient.  She notes that she has to take a break when ambulating to the clinic today from the parking lot.  She denies any SOB at rest.  She denies any chest pain for diaphoresis.   She notes unintentional weight loss. She does note an appetite, but it is not as strong as a few years back.  She is supplementing her diet with Boost and ensure.  She otherwise feels well from that standpoint and denies any nausea or vomiting.  Review of Systems  Constitutional: Positive for malaise/fatigue and weight loss (unintentional). Negative for chills and fever.  HENT: Negative.   Eyes: Negative.   Respiratory: Positive for shortness of breath (with exertion only). Negative for cough, hemoptysis and wheezing.   Cardiovascular: Negative.  Negative for chest pain and palpitations.  Gastrointestinal: Negative.  Negative for blood in stool.  Genitourinary: Negative.  Negative for hematuria.  Musculoskeletal: Negative.   Skin: Negative.   Neurological: Negative.  Negative for weakness.  Endo/Heme/Allergies: Negative.  Does not bruise/bleed easily.  Psychiatric/Behavioral: Negative.     Past Medical History:  Diagnosis Date  . Anemia   . GAVE (gastric antral vascular ectasia) 09/12/12  . Hypertension   . Hypothyroidism   . Iron deficiency anemia 10/15/2012  . Iron deficiency anemia due to chronic blood loss 04/20/2014  . On home O2    wears 2L/[place] at night  .  Palpitations     Past Surgical History:  Procedure Laterality Date  . ABDOMINAL HYSTERECTOMY    . back surger x 2    . COLONOSCOPY  07/24/2012   Procedure: COLONOSCOPY;  Surgeon: Rogene Houston, MD;  [facility];  Service: Endoscopy;  Laterality: N/A;  200  . ESOPHAGOGASTRODUODENOSCOPY  09/12/2012   Procedure: ESOPHAGOGASTRODUODENOSCOPY (EGD);  Surgeon: Rogene Houston, MD;  [facility];  Service: Endoscopy;  Laterality: N/A;  325-changed to 200 per Ann-pt moved up to [place] to notify pt  . ESOPHAGOGASTRODUODENOSCOPY  11/01/2012   Procedure: ESOPHAGOGASTRODUODENOSCOPY (EGD);  Surgeon: Rogene Houston, MD;  [facility];  Service: Endoscopy;  Laterality: N/A;  1:20  . ESOPHAGOGASTRODUODENOSCOPY N/A 07/04/2013   Procedure: ESOPHAGOGASTRODUODENOSCOPY (EGD);  Surgeon: Rogene Houston, MD;  [facility];  Service: Endoscopy;  Laterality: N/A;  850  . ESOPHAGOGASTRODUODENOSCOPY N/A 08/06/2013   Procedure: ESOPHAGOGASTRODUODENOSCOPY (EGD);  Surgeon: Rogene Houston, MD;  [facility];  Service: Endoscopy;  Laterality: N/A;  1200  . ESOPHAGOGASTRODUODENOSCOPY N/A 10/31/2013   Procedure: ESOPHAGOGASTRODUODENOSCOPY (EGD);  Surgeon: Rogene Houston, MD;  [facility];  Service: Endoscopy;  Laterality: N/A;  125-moved to [place] notified pt  . ESOPHAGOGASTRODUODENOSCOPY N/A 10/23/2014   Procedure: ESOPHAGOGASTRODUODENOSCOPY (EGD);  Surgeon: Rogene Houston, MD;  [facility];  Service: Endoscopy;  Laterality: N/A;  1020  . HEEL SPUR SURGERY    . HOT HEMOSTASIS  11/01/2012   Procedure: HOT HEMOSTASIS (ARGON PLASMA COAGULATION/BICAP);  Surgeon:  Rogene Houston, MD;  [facility];  Service: Endoscopy;  Laterality: N/A;  . HOT HEMOSTASIS N/A 07/04/2013   Procedure: HOT HEMOSTASIS (ARGON PLASMA COAGULATION/BICAP);  Surgeon: Rogene Houston, MD;  [facility];  Service: Endoscopy;  Laterality: N/A;  . HOT  HEMOSTASIS N/A 08/06/2013   Procedure: HOT HEMOSTASIS (ARGON PLASMA COAGULATION/BICAP);  Surgeon: Rogene Houston, MD;  [facility];  Service: Endoscopy;  Laterality: N/A;  . HOT HEMOSTASIS N/A 10/31/2013   Procedure: HOT HEMOSTASIS (ARGON PLASMA COAGULATION/BICAP);  Surgeon: Rogene Houston, MD;  [facility];  Service: Endoscopy;  Laterality: N/A;  . HOT HEMOSTASIS N/A 10/23/2014   Procedure: HOT HEMOSTASIS (ARGON PLASMA COAGULATION/BICAP);  Surgeon: Rogene Houston, MD;  [facility];  Service: Endoscopy;  Laterality: N/A;  . knee replacement rt 3 yrs ago    . LUMBAR FUSION  06/25/14   L4 & L5  . SHOULDER SURGERY    . TONSILLECTOMY AND ADENOIDECTOMY    . TOTAL ABDOMINAL HYSTERECTOMY    . US ECHOCARDIOGRAPHY  03/12/2006   EF 55-60%    Family History  Problem Relation Age of Onset  . Hypertension Mother   . Hypertension Father   . Heart attack Father     Social History   Social History  . Marital status: Married    Spouse name: N/A  . Number of children: N/A  . Years of education: N/A   Social History Main Topics  . Smoking status: Never Smoker  . Smokeless tobacco: Never Used  . Alcohol use No  . Drug use: No  . Sexual activity: Not Asked   Other Topics Concern  . None   Social History Narrative  . None     PHYSICAL EXAMINATION  ECOG PERFORMANCE STATUS: 1 - Symptomatic but completely ambulatory  Vitals:   06/12/16 1002  BP: (!) 151/64  Pulse: 65  Resp: 18  Temp: 97.6 F (36.4 C)    GENERAL:alert, no distress, well nourished, well developed, comfortable, cooperative, smiling and unaccompanied SKIN: skin color, texture, turgor are normal, no rashes or significant lesions HEAD: Normocephalic, No masses, lesions, tenderness or abnormalities EYES: normal, EOMI, Conjunctiva are pink and non-injected EARS: External ears normal OROPHARYNX:lips, buccal mucosa, and tongue normal and mucous membranes are moist  NECK: supple, no  adenopathy, thyroid normal size, non-tender, without nodularity, trachea midline LYMPH:  no palpable lymphadenopathy BREAST:not examined LUNGS: clear to auscultation and percussion HEART: regular rate & rhythm, no murmurs and no gallops ABDOMEN:abdomen soft and normal bowel sounds BACK: Back symmetric, no curvature. EXTREMITIES:less then 2 second capillary refill, no joint deformities, effusion, or inflammation, no skin discoloration, no cyanosis  NEURO: alert & oriented x 3 with fluent speech, no focal motor/sensory deficits, gait normal   LABORATORY DATA: CBC    Component Value Date/Time   WBC 7.6 06/08/2016 1054   RBC 3.78 (L) 06/08/2016 1054   HGB 12.5 06/08/2016 1054   HCT 36.8 06/08/2016 1054   PLT 314 06/08/2016 1054   MCV 97.4 06/08/2016 1054   MCH 33.1 06/08/2016 1054   MCHC 34.0 06/08/2016 1054   RDW 14.8 06/08/2016 1054   LYMPHSABS 1.0 06/08/2016 1054   MONOABS 1.0 06/08/2016 1054   EOSABS 0.2 06/08/2016 1054   BASOSABS 0.1 06/08/2016 1054      Chemistry      Component Value Date/Time   NA 137 06/08/2016 1050   K 4.2 06/08/2016 1050   CL 102 06/08/2016 1050  CO2 28 06/08/2016 1050   BUN 18 06/08/2016 1050   CREATININE 1.00 06/08/2016 1050   CREATININE 1.07 (H) 09/16/2015 0855      Component Value Date/Time   CALCIUM 9.0 06/08/2016 1050   ALKPHOS 80 06/08/2016 1050   AST 26 06/08/2016 1050   ALT 22 06/08/2016 1050   BILITOT 0.4 06/08/2016 1050        PENDING LABS:   RADIOGRAPHIC STUDIES:  No results found.   PATHOLOGY:    ASSESSMENT AND PLAN:  Iron deficiency anemia due to chronic blood loss Iron deficiency anemia secondary to GAVE Syndrome and chronic GI blood loss.    Labs on 06/08/2016: CBC, ferritin.  I personally reviewed and went over laboratory results with the patient.  The results are noted within this dictation.  Her ferritin is at 19.  Based on her iron deficit calculation, she needs about 200 mg.  HOWEVER, historically, ferric  gluconate has not been effective at raising her ferritin up to 100.  As a result, I will get her set-up for 1 dose of feraheme.    Given her IV iron needs over the past year, we will refer her to GI for evaluation knowing that she has GAVE.  She may need cautery.  Additionally, she is experiencing unintentional weight loss.  Her weight loss is significant.  She is down 12 lbs over the past 6 months and down ~ 14 lbs over the past 14 months. I will work this up further with labs in 4 weeks: LDH, ESR, CRP.    If weight loss persists and Dr. Olevia Perches work-up for increased IV iron needs is negative, then I will need to consider imaging studies, if approved by insurance company.  I personally reviewed and went over radiographic studies with the patient.  The results are noted within this dictation.  Mammogram in May 2017 was BIRADS 1.  She will be due in May 2018 for her next mammogram.  Labs monthly: CBC, ferritin.  Return in 3 months for follow-up.  GAVE (gastric antral vascular ectasia) S/P EGD by Dr. Daisey Must in Dec 2015.  He notes:  Extensive gastric antral Vassar [sic] ectasia along with few at cardia. Majority of these lesions were ablated with argon plasma coagulation.    ORDERS PLACED FOR THIS ENCOUNTER: Orders Placed This Encounter  Procedures  . C-reactive protein  . Sedimentation rate  . Lactate dehydrogenase    MEDICATIONS PRESCRIBED THIS ENCOUNTER: No orders of the defined types were placed in this encounter.   THERAPY PLAN:  Continue with close monitoring of iron studies and IV iron replacement when indicated.  She will be referred back to Dr. Laural Golden for consideration of repeat EGD and ablation if needed.  All questions were answered. The patient knows to call the clinic with any problems, questions or concerns. We can certainly see the patient much sooner if necessary.  Patient and plan discussed with Dr. Ancil Linsey and she is in agreement with the aforementioned.    This note is electronically signed by: Doy Mince 06/12/2016 6:27 PM

## 2016-06-12 NOTE — Assessment & Plan Note (Addendum)
Iron deficiency anemia secondary to GAVE Syndrome and chronic GI blood loss.    Labs on 06/08/2016: CBC, ferritin.  I personally reviewed and went over laboratory results with the patient.  The results are noted within this dictation.  Her ferritin is at 19.  Based on her iron deficit calculation, she needs about 200 mg.  HOWEVER, historically, ferric gluconate has not been effective at raising her ferritin up to 100.  As a result, I will get her set-up for 1 dose of feraheme.    Given her IV iron needs over the past year, we will refer her to GI for evaluation knowing that she has GAVE.  She may need cautery.  Additionally, she is experiencing unintentional weight loss.  Her weight loss is significant.  She is down 12 lbs over the past 6 months and down ~ 14 lbs over the past 14 months. I will work this up further with labs in 4 weeks: LDH, ESR, CRP.    If weight loss persists and Dr. Olevia Perches work-up for increased IV iron needs is negative, then I will need to consider imaging studies, if approved by insurance company.  I personally reviewed and went over radiographic studies with the patient.  The results are noted within this dictation.  Mammogram in May 2017 was BIRADS 1.  She will be due in May 2018 for her next mammogram.  Labs monthly: CBC, ferritin.  Return in 3 months for follow-up.

## 2016-06-12 NOTE — Assessment & Plan Note (Signed)
S/P EGD by Dr. Rehmen in Dec 2015.  He notes:  Extensive gastric antral Vassar [sic] ectasia along with few at cardia. Majority of these lesions were ablated with argon plasma coagulation.     

## 2016-06-12 NOTE — Patient Instructions (Signed)
[place] at [facility] Discharge Instructions  RECOMMENDATIONS MADE BY THE CONSULTANT AND ANY TEST RESULTS WILL BE SENT TO YOUR REFERRING PHYSICIAN.  You were seen by Gershon Mussel today. IV Feraheme this week  Labs every 4 weeks Refer to [facility] for increase IV iron needs and unintentional weight loss Return in 3 months for follow up.  Thank you for choosing [place] at [facility] to provide your oncology and hematology care.  To afford each patient quality time with our provider, please arrive at least 15 minutes before your scheduled appointment time.   Beginning January 23rd 2017 lab work for the [facility] will be done in the  Main lab at [facility] on 1st floor. If you have a lab appointment with the [facility] please come in thru the  Main Entrance and check in at the main information desk  You need to re-schedule your appointment should you arrive 10 or more minutes late.  We strive to give you quality time with our providers, and arriving late affects you and other patients whose appointments are after yours.  Also, if you no show three or more times for appointments you may be dismissed from the clinic at the providers discretion.     Again, thank you for choosing [facility].  Our hope is that these requests will decrease the amount of time that you wait before being seen by our physicians.       _____________________________________________________________  Should you have questions after your visit to [facility], please contact our office at [phone] between the hours of 8:30 a.m. and 4:30 p.m.  Voicemails left after 4:30 p.m. will not be returned until the following business day.  For prescription refill requests, have your pharmacy contact our office.         Resources For Cancer Patients and their Caregivers ? American Cancer Society: Can assist with transportation, wigs, general needs, runs  Look Good Feel Better.        906-799-8829 ? Cancer Care: Provides financial assistance, online support groups, medication/co-pay assistance.  1-800-813-HOPE 351-022-8434) ? [place] Assists [place] cancer patients and their families through emotional , educational and financial support.  (219) 152-1971 ? [place] DSS Where to apply for food stamps, Medicaid and utility assistance. (407) 518-6642 ? RCATS: Transportation to medical appointments. 631-216-2657 ? Social Security Administration: May apply for disability if have a Stage IV cancer. [phone] 250-341-7580 ? [employer], Disability and Transit Services: Assists with nutrition, care and transit needs. [place] Support Programs: @10RELATIVEDAYS @ > Cancer Support Group  2nd Tuesday of the month 1pm-2pm, Journey Room  > Creative Journey  3rd Tuesday of the month 1130am-1pm, Journey Room  > Look Good Feel Better  1st Wednesday of the month 10am-12 noon, Journey Room (Call [place] to register [phone])

## 2016-06-14 ENCOUNTER — Encounter (HOSPITAL_COMMUNITY): Payer: Medicare Other | Attending: Hematology & Oncology

## 2016-06-14 ENCOUNTER — Encounter (HOSPITAL_COMMUNITY): Payer: Self-pay

## 2016-06-14 VITALS — BP 137/54 | HR 59 | Temp 97.3°F | Resp 18

## 2016-06-14 DIAGNOSIS — D500 Iron deficiency anemia secondary to blood loss (chronic): Secondary | ICD-10-CM | POA: Diagnosis present

## 2016-06-14 MED ORDER — SODIUM CHLORIDE 0.9 % IV SOLN
510.0000 mg | Freq: Once | INTRAVENOUS | Status: AC
  Administered 2016-06-14: 510 mg via INTRAVENOUS
  Filled 2016-06-14: qty 17

## 2016-06-14 MED ORDER — SODIUM CHLORIDE 0.9 % IV SOLN
Freq: Once | INTRAVENOUS | Status: AC
  Administered 2016-06-14: 15:00:00 via INTRAVENOUS

## 2016-06-14 NOTE — Patient Instructions (Signed)
[facility] at [facility] Discharge Instructions  RECOMMENDATIONS MADE BY THE CONSULTANT AND ANY TEST RESULTS WILL BE SENT TO YOUR REFERRING PHYSICIAN.  IV iron.    Thank you for choosing [facility] at [facility] to provide your oncology and hematology care.  To afford each patient quality time with our provider, please arrive at least 15 minutes before your scheduled appointment time.   Beginning January 23rd 2017 lab work for the Cancer Center will be done in the  Main lab at [facility] on 1st floor. If you have a lab appointment with the Cancer Center please come in thru the  Main Entrance and check in at the main information desk  You need to re-schedule your appointment should you arrive 10 or more minutes late.  We strive to give you quality time with our providers, and arriving late affects you and other patients whose appointments are after yours.  Also, if you no show three or more times for appointments you may be dismissed from the clinic at the providers discretion.     Again, thank you for choosing [facility].  Our hope is that these requests will decrease the amount of time that you wait before being seen by our physicians.       _____________________________________________________________  Should you have questions after your visit to [facility], please contact our office at [phone] between the hours of 8:30 a.m. and 4:30 p.m.  Voicemails left after 4:30 p.m. will not be returned until the following business day.  For prescription refill requests, have your pharmacy contact our office.         Resources For Cancer Patients and their Caregivers ? American Cancer Society: Can assist with transportation, wigs, general needs, runs Look Good Feel Better.        1-888-227-6333 ? Cancer Care: Provides financial assistance, online support groups, medication/co-pay assistance.  1-800-813-HOPE  (4673) ? [facility] Assists [place] cancer patients and their families through emotional , educational and financial support.  336-427-4357 ? [place] DSS Where to apply for food stamps, Medicaid and utility assistance. 336-342-1394 ? RCATS: Transportation to medical appointments. 336-347-2287 ? Social Security Administration: May apply for disability if have a Stage IV cancer. 336-342-7796 1-800-772-1213 ? [place] Aging, Disability and Transit Services: Assists with nutrition, care and transit needs. 336-349-2343  Cancer Center Support Programs: @10RELATIVEDAYS@ > Cancer Support Group  2nd Tuesday of the month 1pm-2pm, Journey Room  > Creative Journey  3rd Tuesday of the month 1130am-1pm, Journey Room  > Look Good Feel Better  1st Wednesday of the month 10am-12 noon, Journey Room (Call [employer] to register [phone])    

## 2016-06-14 NOTE — Progress Notes (Signed)
Patient tolerated infusion well.  VSS.   

## 2016-06-15 ENCOUNTER — Encounter (INDEPENDENT_AMBULATORY_CARE_PROVIDER_SITE_OTHER): Payer: Self-pay | Admitting: Internal Medicine

## 2016-06-15 ENCOUNTER — Encounter (INDEPENDENT_AMBULATORY_CARE_PROVIDER_SITE_OTHER): Payer: Self-pay

## 2016-06-20 ENCOUNTER — Encounter (INDEPENDENT_AMBULATORY_CARE_PROVIDER_SITE_OTHER): Payer: Self-pay | Admitting: Internal Medicine

## 2016-06-20 ENCOUNTER — Other Ambulatory Visit (INDEPENDENT_AMBULATORY_CARE_PROVIDER_SITE_OTHER): Payer: Self-pay | Admitting: Internal Medicine

## 2016-06-20 ENCOUNTER — Ambulatory Visit (INDEPENDENT_AMBULATORY_CARE_PROVIDER_SITE_OTHER): Payer: Medicare Other | Admitting: Internal Medicine

## 2016-06-20 VITALS — BP 134/80 | HR 60 | Ht 61.0 in | Wt 132.2 lb

## 2016-06-20 DIAGNOSIS — D509 Iron deficiency anemia, unspecified: Secondary | ICD-10-CM

## 2016-06-20 DIAGNOSIS — K31819 Angiodysplasia of stomach and duodenum without bleeding: Secondary | ICD-10-CM

## 2016-06-20 NOTE — Patient Instructions (Signed)
EGD with argon plasma coagulator

## 2016-06-20 NOTE — Progress Notes (Signed)
Subjective:    Patient ID: [name], female    DOB: [birth year], 82 y.o.   MRN: [id number]  HPI Here today for f/u.   IHx of ron deficiency anemia secondary to GAVE Syndrome and chronic GI blood loss. Weight loss of 14 pounds over the past year. Her last EGD was in 2015. She received Feraheme was 06/14/2016. Her stools are brown to dark in color. She says she cannot detect when she is bleeding. Appetite is okay. She says she has early satiety.  She makes herself eat She has a BM daily with Miralax as needed. She denies any abdominal pain. H      10/23/2014  EGD  Indications:  Patient is 82 year old Caucasian female who has recurrent iron deficiency anemia secondary to GI blood loss from gastric antral gastric ectasia. Patient's hemoglobin has dropped again longer dropping iron and iron saturation she received 2 doses of Feraheme. Patient is undergoing therapeutic EGD. Last EGD with APC therapy was one year ago.                                                                                                                                                                                         Impression: Extensive gastric antral Vassar ectasia along with few at cardia. Majority of these lesions were ablated with argon plasma coagulation.      07/24/2012 Colonoscopy  Indications:  Patient is 82 year old Caucasian female who is undergoing colonoscopy primarily for screening purposes. She was recently found to be in manic however there is no history of melena rectal bleeding in her stool is guaiac-negative and iron studies not consistent with IDA. Patient's last colonoscopy was 15 years ago.  Normal terminal ileum. Few areas at cecum and ascending colon covered with tiny specks of blood but no underlying mucosal abnormality noted. These changes may be related to aspirin use. Small polyp ablated via cold biopsy from sigmoid colon.  06/08/2016 Ferritin 19 05/12/2016 Ferritin  63 04/11/2016 Ferritin 38  CBC    Component Value Date/Time   WBC 7.6 06/08/2016 1054   RBC 3.78 (L) 06/08/2016 1054   HGB 12.5 06/08/2016 1054   HCT 36.8 06/08/2016 1054   PLT 314 06/08/2016 1054   MCV 97.4 06/08/2016 1054   MCH 33.1 06/08/2016 1054   MCHC 34.0 06/08/2016 1054   RDW 14.8 06/08/2016 1054   LYMPHSABS 1.0 06/08/2016 1054   MONOABS 1.0 06/08/2016 1054   EOSABS 0.2 06/08/2016 1054   BASOSABS 0.1 06/08/2016 1054  Review of Systems Past Medical History:  Diagnosis Date  . Anemia   . GAVE (gastric antral vascular ectasia) 09/12/12  . Hypertension   . Hypothyroidism   . Iron deficiency anemia 10/15/2012  . Iron deficiency anemia due to chronic blood loss 04/20/2014  . On home O2    wears 2L/[place] at night  . Palpitations     Past Surgical History:  Procedure Laterality Date  . ABDOMINAL HYSTERECTOMY    . back surger x 2    . COLONOSCOPY  07/24/2012   Procedure: COLONOSCOPY;  Surgeon: Rogene Houston, MD;  [facility];  Service: Endoscopy;  Laterality: N/A;  200  . ESOPHAGOGASTRODUODENOSCOPY  09/12/2012   Procedure: ESOPHAGOGASTRODUODENOSCOPY (EGD);  Surgeon: Rogene Houston, MD;  [facility];  Service: Endoscopy;  Laterality: N/A;  325-changed to 200 per Ann-pt moved up to [place] to notify pt  . ESOPHAGOGASTRODUODENOSCOPY  11/01/2012   Procedure: ESOPHAGOGASTRODUODENOSCOPY (EGD);  Surgeon: Rogene Houston, MD;  [facility];  Service: Endoscopy;  Laterality: N/A;  1:20  . ESOPHAGOGASTRODUODENOSCOPY N/A 07/04/2013   Procedure: ESOPHAGOGASTRODUODENOSCOPY (EGD);  Surgeon: Rogene Houston, MD;  [facility];  Service: Endoscopy;  Laterality: N/A;  850  . ESOPHAGOGASTRODUODENOSCOPY N/A 08/06/2013   Procedure: ESOPHAGOGASTRODUODENOSCOPY (EGD);  Surgeon: Rogene Houston, MD;  [facility];  Service: Endoscopy;  Laterality: N/A;  1200  . ESOPHAGOGASTRODUODENOSCOPY N/A  10/31/2013   Procedure: ESOPHAGOGASTRODUODENOSCOPY (EGD);  Surgeon: Rogene Houston, MD;  [facility];  Service: Endoscopy;  Laterality: N/A;  125-moved to [place] notified pt  . ESOPHAGOGASTRODUODENOSCOPY N/A 10/23/2014   Procedure: ESOPHAGOGASTRODUODENOSCOPY (EGD);  Surgeon: Rogene Houston, MD;  [facility];  Service: Endoscopy;  Laterality: N/A;  1020  . HEEL SPUR SURGERY    . HOT HEMOSTASIS  11/01/2012   Procedure: HOT HEMOSTASIS (ARGON PLASMA COAGULATION/BICAP);  Surgeon: Rogene Houston, MD;  [facility];  Service: Endoscopy;  Laterality: N/A;  . HOT HEMOSTASIS N/A 07/04/2013   Procedure: HOT HEMOSTASIS (ARGON PLASMA COAGULATION/BICAP);  Surgeon: Rogene Houston, MD;  [facility];  Service: Endoscopy;  Laterality: N/A;  . HOT HEMOSTASIS N/A 08/06/2013   Procedure: HOT HEMOSTASIS (ARGON PLASMA COAGULATION/BICAP);  Surgeon: Rogene Houston, MD;  [facility];  Service: Endoscopy;  Laterality: N/A;  . HOT HEMOSTASIS N/A 10/31/2013   Procedure: HOT HEMOSTASIS (ARGON PLASMA COAGULATION/BICAP);  Surgeon: Rogene Houston, MD;  [facility];  Service: Endoscopy;  Laterality: N/A;  . HOT HEMOSTASIS N/A 10/23/2014   Procedure: HOT HEMOSTASIS (ARGON PLASMA COAGULATION/BICAP);  Surgeon: Rogene Houston, MD;  [facility];  Service: Endoscopy;  Laterality: N/A;  . knee replacement rt 3 yrs ago    . LUMBAR FUSION  06/25/14   L4 & L5  . SHOULDER SURGERY    . TONSILLECTOMY AND ADENOIDECTOMY    . TOTAL ABDOMINAL HYSTERECTOMY    . US ECHOCARDIOGRAPHY  03/12/2006   EF 55-60%    Allergies  Allergen Reactions  . Avapro [Irbesartan] Other (See Comments)    Cough   . Clarithromycin Other (See Comments)    Unknown   . Losartan Potassium-Hctz Other (See Comments)    Unknown  . Maxzide [Hydrochlorothiazide W-Triamterene] Other (See Comments)    Unknown   . Ziac [Bisoprolol-Hydrochlorothiazide]     Thinks caused itching and  cough 08/12/12  . Penicillins Rash  . Sulfa Drugs Cross Reactors Rash  Current Outpatient Prescriptions on File Prior to Visit  Medication Sig Dispense Refill  . albuterol (PROVENTIL) (2.5 MG/3ML) 0.083% nebulizer solution Take 2.5 mg by nebulization every 6 (six) hours as needed for wheezing or shortness of breath.    Marland Kitchen amLODipine (NORVASC) 2.5 MG tablet TAKE 1 TABLET BY MOUTH EVERY DAY 90 tablet 5  . Cholecalciferol (VITAMIN D-3) 1000 UNITS CAPS Take 1,000 Units by mouth daily.    . digoxin (DIGOX) 0.125 MG tablet Take 1 tablet mouth daily except none on Sundays or as directed 30 tablet 5  . hydrALAZINE (APRESOLINE) 10 MG tablet Take 1 tablet (10 mg total) by mouth 3 (three) times daily. 270 tablet 3  . levothyroxine (SYNTHROID, LEVOTHROID) 112 MCG tablet Take 112 mcg by mouth daily before breakfast.    . metoprolol tartrate (LOPRESSOR) 25 MG tablet Take 1 tablet (25 mg total) by mouth daily. 90 tablet 1  . pantoprazole (PROTONIX) 40 MG tablet TAKE 1 TABLET BY MOUTH EVERY DAY 90 tablet 3  . polyethylene glycol powder (GLYCOLAX/MIRALAX) powder Take 17 g by mouth daily as needed for moderate constipation.      No current facility-administered medications on file prior to visit.        Objective:   Physical Exam Blood pressure 134/80, pulse 60, height 5\' 1"  (1.549 m), weight 132 lb 3.2 oz (60 kg).  Alert and oriented. Skin warm and dry. Oral mucosa is moist.   . Sclera anicteric, conjunctivae is pink. Thyroid not enlarged. No cervical lymphadenopathy. Lungs clear. Heart regular rate and rhythm.  Abdomen is soft. Bowel sounds are positive. No hepatomegaly. No abdominal masses felt. No tenderness.  No edema to lower extremities. Stool brown and guaiac positive.        Assessment & Plan:  Hx of GAVE. Stool positive today in office. Need EGD with APC. I discussed with Dr. Laural Golden. Patient also would like to proceed with EGD with APC

## 2016-07-12 ENCOUNTER — Other Ambulatory Visit (HOSPITAL_COMMUNITY)
Admission: RE | Admit: 2016-07-12 | Discharge: 2016-07-12 | Disposition: A | Discharge status: Home / Self Care | Payer: Medicare Other | Attending: *Deleted | Admitting: *Deleted

## 2016-07-12 ENCOUNTER — Other Ambulatory Visit (HOSPITAL_COMMUNITY): Payer: Self-pay | Admitting: Adult Health Nurse Practitioner

## 2016-07-12 ENCOUNTER — Ambulatory Visit (HOSPITAL_COMMUNITY)
Admission: RE | Admit: 2016-07-12 | Discharge: 2016-07-12 | Disposition: A | Discharge status: Home / Self Care | Payer: Medicare Other | Source: Ambulatory Visit | Attending: Adult Health Nurse Practitioner | Admitting: Adult Health Nurse Practitioner

## 2016-07-12 ENCOUNTER — Encounter (HOSPITAL_COMMUNITY): Payer: Medicare Other

## 2016-07-12 DIAGNOSIS — J439 Emphysema, unspecified: Secondary | ICD-10-CM | POA: Diagnosis not present

## 2016-07-12 DIAGNOSIS — R918 Other nonspecific abnormal finding of lung field: Secondary | ICD-10-CM | POA: Insufficient documentation

## 2016-07-12 DIAGNOSIS — R634 Abnormal weight loss: Secondary | ICD-10-CM

## 2016-07-12 DIAGNOSIS — D500 Iron deficiency anemia secondary to blood loss (chronic): Secondary | ICD-10-CM

## 2016-07-12 DIAGNOSIS — I700 Atherosclerosis of aorta: Secondary | ICD-10-CM | POA: Diagnosis not present

## 2016-07-12 DIAGNOSIS — R0602 Shortness of breath: Secondary | ICD-10-CM

## 2016-07-12 DIAGNOSIS — R0781 Pleurodynia: Secondary | ICD-10-CM | POA: Diagnosis not present

## 2016-07-12 LAB — COMPREHENSIVE METABOLIC PANEL
ALBUMIN: 3 g/dL — AB (ref 3.5–5.0)
ALK PHOS: 95 U/L (ref 38–126)
ALT: 23 U/L (ref 14–54)
ANION GAP: 10 (ref 5–15)
AST: 29 U/L (ref 15–41)
BUN: 19 mg/dL (ref 6–20)
CALCIUM: 9.1 mg/dL (ref 8.9–10.3)
CHLORIDE: 99 mmol/L — AB (ref 101–111)
CO2: 26 mmol/L (ref 22–32)
Creatinine, Ser: 1.09 mg/dL — ABNORMAL HIGH (ref 0.44–1.00)
GFR calc Af Amer: 54 mL/min — ABNORMAL LOW (ref >60)
GFR calc non Af Amer: 46 mL/min — ABNORMAL LOW (ref >60)
GLUCOSE: 115 mg/dL — AB (ref 65–99)
Potassium: 4.2 mmol/L (ref 3.5–5.1)
SODIUM: 135 mmol/L (ref 135–145)
Total Bilirubin: 0.5 mg/dL (ref 0.3–1.2)
Total Protein: 6.2 g/dL — ABNORMAL LOW (ref 6.5–8.1)

## 2016-07-12 LAB — CBC WITH DIFFERENTIAL/PLATELET
BASOS ABS: 0.1 10*3/uL (ref 0.0–0.1)
BASOS PCT: 1 %
Eosinophils Absolute: 0.1 10*3/uL (ref 0.0–0.7)
Eosinophils Relative: 1 %
HEMATOCRIT: 38.8 % (ref 36.0–46.0)
HEMOGLOBIN: 12.7 g/dL (ref 12.0–15.0)
LYMPHS PCT: 6 %
Lymphs Abs: 0.7 10*3/uL (ref 0.7–4.0)
MCH: 32.2 pg (ref 26.0–34.0)
MCHC: 32.7 g/dL (ref 30.0–36.0)
MCV: 98.2 fL (ref 78.0–100.0)
MONO ABS: 1.6 10*3/uL — AB (ref 0.1–1.0)
Monocytes Relative: 13 %
NEUTROS ABS: 9.6 10*3/uL — AB (ref 1.7–7.7)
NEUTROS PCT: 79 %
Platelets: 372 10*3/uL (ref 150–400)
RBC: 3.95 MIL/uL (ref 3.87–5.11)
RDW: 13.9 % (ref 11.5–15.5)
WBC: 12.1 10*3/uL — ABNORMAL HIGH (ref 4.0–10.5)

## 2016-07-12 LAB — LACTATE DEHYDROGENASE: LDH: 171 U/L (ref 98–192)

## 2016-07-12 LAB — FERRITIN: Ferritin: 69 ng/mL (ref 11–307)

## 2016-07-12 LAB — SEDIMENTATION RATE: Sed Rate: 45 mm/hr — ABNORMAL HIGH (ref 0–22)

## 2016-07-12 LAB — C-REACTIVE PROTEIN: CRP: 6 mg/dL — AB (ref <1.0)

## 2016-07-13 ENCOUNTER — Encounter (HOSPITAL_COMMUNITY): Admission: RE | Payer: Self-pay | Source: Ambulatory Visit

## 2016-07-13 ENCOUNTER — Other Ambulatory Visit (HOSPITAL_COMMUNITY): Payer: Medicare Other

## 2016-07-13 ENCOUNTER — Ambulatory Visit (HOSPITAL_COMMUNITY): Admission: RE | Admit: 2016-07-13 | Payer: Medicare Other | Source: Ambulatory Visit | Admitting: Internal Medicine

## 2016-07-13 SURGERY — EGD (ESOPHAGOGASTRODUODENOSCOPY)
Anesthesia: Moderate Sedation

## 2016-07-24 DIAGNOSIS — Z8701 Personal history of pneumonia (recurrent): Secondary | ICD-10-CM | POA: Diagnosis not present

## 2016-07-24 DIAGNOSIS — K130 Diseases of lips: Secondary | ICD-10-CM | POA: Diagnosis not present

## 2016-08-02 ENCOUNTER — Encounter (INDEPENDENT_AMBULATORY_CARE_PROVIDER_SITE_OTHER): Payer: Self-pay | Admitting: *Deleted

## 2016-08-03 ENCOUNTER — Other Ambulatory Visit (INDEPENDENT_AMBULATORY_CARE_PROVIDER_SITE_OTHER): Payer: Self-pay | Admitting: *Deleted

## 2016-08-03 DIAGNOSIS — K31819 Angiodysplasia of stomach and duodenum without bleeding: Secondary | ICD-10-CM

## 2016-08-11 ENCOUNTER — Encounter (HOSPITAL_COMMUNITY): Payer: Medicare Other | Attending: Hematology & Oncology

## 2016-08-11 DIAGNOSIS — D500 Iron deficiency anemia secondary to blood loss (chronic): Secondary | ICD-10-CM

## 2016-08-11 LAB — CBC WITH DIFFERENTIAL/PLATELET
Basophils Absolute: 0 10*3/uL (ref 0.0–0.1)
Basophils Relative: 1 %
Eosinophils Absolute: 0.2 10*3/uL (ref 0.0–0.7)
Eosinophils Relative: 3 %
HEMATOCRIT: 37.4 % (ref 36.0–46.0)
HEMOGLOBIN: 12.1 g/dL (ref 12.0–15.0)
LYMPHS ABS: 0.6 10*3/uL — AB (ref 0.7–4.0)
LYMPHS PCT: 8 %
MCH: 30.5 pg (ref 26.0–34.0)
MCHC: 32.4 g/dL (ref 30.0–36.0)
MCV: 94.2 fL (ref 78.0–100.0)
MONOS PCT: 17 %
Monocytes Absolute: 1.3 10*3/uL — ABNORMAL HIGH (ref 0.1–1.0)
NEUTROS ABS: 5.2 10*3/uL (ref 1.7–7.7)
NEUTROS PCT: 71 %
Platelets: 344 10*3/uL (ref 150–400)
RBC: 3.97 MIL/uL (ref 3.87–5.11)
RDW: 13.9 % (ref 11.5–15.5)
WBC: 7.3 10*3/uL (ref 4.0–10.5)

## 2016-08-11 LAB — FERRITIN: Ferritin: 15 ng/mL (ref 11–307)

## 2016-08-14 ENCOUNTER — Ambulatory Visit (HOSPITAL_COMMUNITY)
Admission: RE | Admit: 2016-08-14 | Discharge: 2016-08-14 | Disposition: A | Discharge status: Home / Self Care | Payer: Medicare Other | Source: Ambulatory Visit | Attending: Internal Medicine | Admitting: Internal Medicine

## 2016-08-14 ENCOUNTER — Other Ambulatory Visit (HOSPITAL_COMMUNITY): Payer: Self-pay | Admitting: Internal Medicine

## 2016-08-14 ENCOUNTER — Other Ambulatory Visit (HOSPITAL_COMMUNITY): Payer: Self-pay | Admitting: Hematology & Oncology

## 2016-08-14 DIAGNOSIS — J440 Chronic obstructive pulmonary disease with acute lower respiratory infection: Secondary | ICD-10-CM | POA: Diagnosis not present

## 2016-08-14 DIAGNOSIS — J984 Other disorders of lung: Secondary | ICD-10-CM | POA: Insufficient documentation

## 2016-08-14 DIAGNOSIS — J189 Pneumonia, unspecified organism: Secondary | ICD-10-CM | POA: Diagnosis not present

## 2016-08-14 DIAGNOSIS — Z8701 Personal history of pneumonia (recurrent): Secondary | ICD-10-CM | POA: Diagnosis not present

## 2016-08-22 ENCOUNTER — Other Ambulatory Visit: Payer: Self-pay

## 2016-08-22 MED ORDER — AMLODIPINE BESYLATE 2.5 MG PO TABS: 2.5000 mg | ORAL_TABLET | Freq: Every day | ORAL | 3 refills | Status: DC

## 2016-08-23 ENCOUNTER — Other Ambulatory Visit (HOSPITAL_COMMUNITY): Payer: Self-pay | Admitting: Hematology & Oncology

## 2016-08-29 DIAGNOSIS — Z23 Encounter for immunization: Secondary | ICD-10-CM | POA: Diagnosis not present

## 2016-08-30 ENCOUNTER — Encounter (HOSPITAL_COMMUNITY): Payer: Medicare Other | Attending: Hematology & Oncology

## 2016-08-30 ENCOUNTER — Encounter (HOSPITAL_COMMUNITY): Payer: Self-pay

## 2016-08-30 VITALS — BP 134/57 | HR 68 | Temp 98.0°F | Resp 16

## 2016-08-30 DIAGNOSIS — D500 Iron deficiency anemia secondary to blood loss (chronic): Secondary | ICD-10-CM | POA: Diagnosis present

## 2016-08-30 MED ORDER — FERRIC CARBOXYMALTOSE 750 MG/15ML IV SOLN
750.0000 mg | Freq: Once | INTRAVENOUS | Status: AC
  Administered 2016-08-30: 750 mg via INTRAVENOUS
  Filled 2016-08-30: qty 15

## 2016-08-30 MED ORDER — SODIUM CHLORIDE 0.9 % IV SOLN
INTRAVENOUS | Status: DC
  Administered 2016-08-30: 14:00:00 via INTRAVENOUS

## 2016-08-30 NOTE — Patient Instructions (Signed)
[name] at [facility] Discharge Instructions  RECOMMENDATIONS MADE BY THE CONSULTANT AND ANY TEST RESULTS WILL BE SENT TO YOUR REFERRING PHYSICIAN  IV injectafer given today. Follow up as scheduled.  Thank you for choosing [place] at [facility] to provide your oncology and hematology care.  To afford each patient quality time with our provider, please arrive at least 15 minutes before your scheduled appointment time.   Beginning January 23rd 2017 lab work for the [facility] will be done in the  Main lab at [facility] on 1st floor. If you have a lab appointment with the [facility] please come in thru the  Main Entrance and check in at the main information desk  You need to re-schedule your appointment should you arrive 10 or more minutes late.  We strive to give you quality time with our providers, and arriving late affects you and other patients whose appointments are after yours.  Also, if you no show three or more times for appointments you may be dismissed from the clinic at the providers discretion.     Again, thank you for choosing [facility].  Our hope is that these requests will decrease the amount of time that you wait before being seen by our physicians.       _____________________________________________________________  Should you have questions after your visit to [facility], please contact our office at [phone] between the hours of 8:30 a.m. and 4:30 p.m.  Voicemails left after 4:30 p.m. will not be returned until the following business day.  For prescription refill requests, have your pharmacy contact our office.         Resources For Cancer Patients and their Caregivers ? American Cancer Society: Can assist with transportation, wigs, general needs, runs Look Good Feel Better.        [phone] ? Cancer Care: Provides financial assistance, online support groups,  medication/co-pay assistance.  1-800-813-HOPE 631-029-8207) ? [place] Assists [place] cancer patients and their families through emotional , educational and financial support.  609-709-8677 ? [place] DSS Where to apply for food stamps, Medicaid and utility assistance. [phone] 657-2677 ? RCATS: Transportation to medical appointments. 716-411-9404 ? Social Security Administration: May apply for disability if have a Stage IV cancer. 873 [phone] ? [employer], Disability and Transit Services: Assists with nutrition, care and transit needs. [place] Support Programs: @10RELATIVEDAYS @ > Cancer Support Group  2nd Tuesday of the month 1pm-2pm, Journey Room  > Creative Journey  3rd Tuesday of the month 1130am-1pm, Journey Room  > Look Good Feel Better  1st Wednesday of the month 10am-12 noon, Journey Room (Call [place] to register [phone])

## 2016-08-30 NOTE — Progress Notes (Signed)
IV injectafer given per orders. Patient tolerated well , no problems. Vitals stable and discharged from clinic ambulatory.

## 2016-09-12 ENCOUNTER — Encounter (HOSPITAL_COMMUNITY): Payer: Medicare Other

## 2016-09-12 ENCOUNTER — Encounter (HOSPITAL_BASED_OUTPATIENT_CLINIC_OR_DEPARTMENT_OTHER): Payer: Medicare Other | Admitting: Hematology & Oncology

## 2016-09-12 ENCOUNTER — Encounter (HOSPITAL_COMMUNITY): Payer: Self-pay | Admitting: Hematology & Oncology

## 2016-09-12 VITALS — BP 128/45 | HR 73 | Temp 98.0°F | Resp 16 | Wt 128.5 lb

## 2016-09-12 DIAGNOSIS — D500 Iron deficiency anemia secondary to blood loss (chronic): Secondary | ICD-10-CM

## 2016-09-12 DIAGNOSIS — R634 Abnormal weight loss: Secondary | ICD-10-CM

## 2016-09-12 DIAGNOSIS — K31819 Angiodysplasia of stomach and duodenum without bleeding: Secondary | ICD-10-CM

## 2016-09-12 LAB — CBC WITH DIFFERENTIAL/PLATELET
BASOS PCT: 1 %
Basophils Absolute: 0.1 10*3/uL (ref 0.0–0.1)
EOS ABS: 0.3 10*3/uL (ref 0.0–0.7)
EOS PCT: 3 %
HCT: 39.2 % (ref 36.0–46.0)
Hemoglobin: 12.3 g/dL (ref 12.0–15.0)
Lymphocytes Relative: 11 %
Lymphs Abs: 1.2 10*3/uL (ref 0.7–4.0)
MCH: 29.8 pg (ref 26.0–34.0)
MCHC: 31.4 g/dL (ref 30.0–36.0)
MCV: 94.9 fL (ref 78.0–100.0)
MONO ABS: 0.9 10*3/uL (ref 0.1–1.0)
MONOS PCT: 9 %
NEUTROS PCT: 76 %
Neutro Abs: 8.2 10*3/uL — ABNORMAL HIGH (ref 1.7–7.7)
PLATELETS: 303 10*3/uL (ref 150–400)
RBC: 4.13 MIL/uL (ref 3.87–5.11)
RDW: 18.7 % — AB (ref 11.5–15.5)
WBC: 10.7 10*3/uL — ABNORMAL HIGH (ref 4.0–10.5)

## 2016-09-12 LAB — FERRITIN: FERRITIN: 223 ng/mL (ref 11–307)

## 2016-09-12 NOTE — Patient Instructions (Addendum)
[place] at [facility] Discharge Instructions  RECOMMENDATIONS MADE BY THE CONSULTANT AND ANY TEST RESULTS WILL BE SENT TO YOUR REFERRING PHYSICIAN.  You saw Dr.Penland today. Continue monthly standing labs. Follow up in 3 months. See Amy at checkout for appointments.  Thank you for choosing [place] at [facility] to provide your oncology and hematology care.  To afford each patient quality time with our provider, please arrive at least 15 minutes before your scheduled appointment time.   Beginning January 23rd 2017 lab work for the [facility] will be done in the  Main lab at [facility] on 1st floor. If you have a lab appointment with the [facility] please come in thru the  Main Entrance and check in at the main information desk  You need to re-schedule your appointment should you arrive 10 or more minutes late.  We strive to give you quality time with our providers, and arriving late affects you and other patients whose appointments are after yours.  Also, if you no show three or more times for appointments you may be dismissed from the clinic at the providers discretion.     Again, thank you for choosing [facility].  Our hope is that these requests will decrease the amount of time that you wait before being seen by our physicians.       _____________________________________________________________  Should you have questions after your visit to [facility], please contact our office at [phone] between the hours of 8:30 a.m. and 4:30 p.m.  Voicemails left after 4:30 p.m. will not be returned until the following business day.  For prescription refill requests, have your pharmacy contact our office.         Resources For Cancer Patients and their Caregivers ? American Cancer Society: Can assist with transportation, wigs, general needs, runs Look Good Feel Better.        713-169-2511 ? Cancer  Care: Provides financial assistance, online support groups, medication/co-pay assistance.  1-800-813-HOPE 941-289-7351) ? [place] Assists [place] cancer patients and their families through emotional , educational and financial support.  2253767321 ? [place] DSS Where to apply for food stamps, Medicaid and utility assistance. 903 395 6157 ? RCATS: Transportation to medical appointments. (662)735-8129 ? Social Security Administration: May apply for disability if have a Stage IV cancer. (310)008-2603 (631) 731-2897 ? [employer], Disability and Transit Services: Assists with nutrition, care and transit needs. [place] Support Programs: @10RELATIVEDAYS @ > Cancer Support Group  2nd Tuesday of the month 1pm-2pm, Journey Room  > Creative Journey  3rd Tuesday of the month 1130am-1pm, Journey Room  > Look Good Feel Better  1st Wednesday of the month 10am-12 noon, Journey Room (Call [place] to register [phone])

## 2016-09-12 NOTE — Progress Notes (Signed)
Wende Neighbors, MD [address] [place] [address]  No diagnosis found.  CURRENT THERAPY: IV Feraheme 510 mg   INTERVAL HISTORY: [name] 82 y.o. 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  Patient is unaccompanied. She has required IV iron almost monthly. She denies any obvious BRBPR, She has an upcoming EGD scheduled with Dr. Laural Golden. She saw Deberah Castle last in August. She developed pneumonia and had to reschedule. Her appointment is now scheduled for Nov. 29.  [name] says she has a poor appetite. She always feels full. She notes however that this is chronic and unchanged. She has lost 17 pounds over the last 2 years.  She has no other major complaints today. Energy is ok. She is able to do her ADL's.   12/11/2015EGD  Indications:Patient is 82 year old Caucasian female who has recurrent iron deficiency anemia secondary to GI blood loss from gastric antral gastric ectasia. Patient's hemoglobin has dropped again longer dropping iron and iron saturation she received 2 doses of Feraheme. Patient is undergoing therapeutic EGD. Last EGD with APC therapy was one year ago. Impression: Extensive gastric antral [place] ectasia along with few at cardia. Majority of these lesions were ablated with argon plasma coagulation  Past Medical History:  Diagnosis Date  . Anemia   . GAVE (gastric antral vascular ectasia) 09/12/12  . Hypertension   . Hypothyroidism   . Iron deficiency anemia 10/15/2012  . Iron deficiency anemia due to chronic blood loss 04/20/2014  . On home O2    wears 2L/[place] at night  . Palpitations     has Benign hypertensive heart disease without heart failure;  Palpitations; Hypothyroidism; GERD (gastroesophageal reflux disease); Pruritus; Pulmonary hypertension ([place]); Pulmonary nodule; Chronic respiratory failure with hypoxia ([place]); Constipation; Acute posthemorrhagic anemia; GAVE (gastric antral vascular ectasia); Iron deficiency anemia due to chronic blood loss; and Family history of aortic aneurysm on her problem list.     is allergic to avapro [irbesartan]; clarithromycin; losartan potassium-hctz; maxzide [hydrochlorothiazide w-triamterene]; ziac [bisoprolol-hydrochlorothiazide]; penicillins; and sulfa drugs cross reactors.  Ms. Saw does not currently have medications on file.  Past Surgical History:  Procedure Laterality Date  . ABDOMINAL HYSTERECTOMY    . back surger x 2    . COLONOSCOPY  07/24/2012   Procedure: COLONOSCOPY;  Surgeon: Rogene Houston, MD;  [facility];  Service: Endoscopy;  Laterality: N/A;  200  . ESOPHAGOGASTRODUODENOSCOPY  09/12/2012   Procedure: ESOPHAGOGASTRODUODENOSCOPY (EGD);  Surgeon: Rogene Houston, MD;  [facility];  Service: Endoscopy;  Laterality: N/A;  325-changed to 200 per Ann-pt moved up to [place] to notify pt  . ESOPHAGOGASTRODUODENOSCOPY  11/01/2012   Procedure: ESOPHAGOGASTRODUODENOSCOPY (EGD);  Surgeon: Rogene Houston, MD;  [facility];  Service: Endoscopy;  Laterality: N/A;  1:20  . ESOPHAGOGASTRODUODENOSCOPY N/A 07/04/2013   Procedure: ESOPHAGOGASTRODUODENOSCOPY (EGD);  Surgeon: Rogene Houston, MD;  [facility];  Service: Endoscopy;  Laterality: N/A;  850  . ESOPHAGOGASTRODUODENOSCOPY N/A 08/06/2013   Procedure: ESOPHAGOGASTRODUODENOSCOPY (EGD);  Surgeon: Rogene Houston, MD;  [facility];  Service: Endoscopy;  Laterality: N/A;  1200  . ESOPHAGOGASTRODUODENOSCOPY N/A 10/31/2013   Procedure: ESOPHAGOGASTRODUODENOSCOPY (EGD);  Surgeon: Rogene Houston, MD;  [facility];  Service: Endoscopy;  Laterality: N/A;  125-moved to [place]  notified pt  . ESOPHAGOGASTRODUODENOSCOPY N/A 10/23/2014   Procedure:  ESOPHAGOGASTRODUODENOSCOPY (EGD);  Surgeon: Rogene Houston, MD;  [facility];  Service: Endoscopy;  Laterality: N/A;  1020  . HEEL SPUR SURGERY    . HOT HEMOSTASIS  11/01/2012   Procedure: HOT HEMOSTASIS (ARGON PLASMA COAGULATION/BICAP);  Surgeon: Rogene Houston, MD;  [facility];  Service: Endoscopy;  Laterality: N/A;  . HOT HEMOSTASIS N/A 07/04/2013   Procedure: HOT HEMOSTASIS (ARGON PLASMA COAGULATION/BICAP);  Surgeon: Rogene Houston, MD;  [facility];  Service: Endoscopy;  Laterality: N/A;  . HOT HEMOSTASIS N/A 08/06/2013   Procedure: HOT HEMOSTASIS (ARGON PLASMA COAGULATION/BICAP);  Surgeon: Rogene Houston, MD;  [facility];  Service: Endoscopy;  Laterality: N/A;  . HOT HEMOSTASIS N/A 10/31/2013   Procedure: HOT HEMOSTASIS (ARGON PLASMA COAGULATION/BICAP);  Surgeon: Rogene Houston, MD;  [facility];  Service: Endoscopy;  Laterality: N/A;  . HOT HEMOSTASIS N/A 10/23/2014   Procedure: HOT HEMOSTASIS (ARGON PLASMA COAGULATION/BICAP);  Surgeon: Rogene Houston, MD;  [facility];  Service: Endoscopy;  Laterality: N/A;  . knee replacement rt 3 yrs ago    . LUMBAR FUSION  06/25/14   L4 & L5  . SHOULDER SURGERY    . TONSILLECTOMY AND ADENOIDECTOMY    . TOTAL ABDOMINAL HYSTERECTOMY    . US ECHOCARDIOGRAPHY  03/12/2006   EF 55-60%    Positive for poor appetite. Denies any headaches, dizziness, double vision, fevers, chills, night sweats, nausea, vomiting, diarrhea, constipation, chest pain, heart palpitations, shortness of breath, blood in stool, black tarry stool, urinary pain, urinary burning, urinary frequency, hematuria. 14 point review of systems was performed and is negative except as detailed under history of present illness and above  PHYSICAL EXAMINATION  ECOG PERFORMANCE STATUS: 0 - Asymptomatic  Vitals:   09/12/16 1320  BP: (!) 128/45    Pulse: 73  Resp: 16  Temp: 98 F (36.7 C)    GENERAL:alert, no distress, well nourished, well developed, comfortable, cooperative and smiling SKIN: skin color, texture, turgor are normal, no rashes or significant lesions HEAD: Normocephalic, No masses, lesions, tenderness or abnormalities EYES: normal, PERRLA, EOMI, Conjunctiva are pink and non-injected EARS: External ears normal OROPHARYNX:lips, buccal mucosa, and tongue normal and mucous membranes are moist  NECK: supple, no adenopathy, thyroid normal size, non-tender, without nodularity, no stridor, non-tender, trachea midline LYMPH:  no palpable lymphadenopathy BREAST:not examined LUNGS: clear to auscultation and percussion HEART: regular rate & rhythm, no gallops, S1 normal and S2 normal    Murmur heard. ABDOMEN:abdomen soft, non-tender and normal bowel sounds BACK: Back symmetric, no curvature., No CVA tenderness EXTREMITIES:less then 2 second capillary refill, no joint deformities, effusion, or inflammation, no edema, no skin discoloration, no clubbing, no cyanosis  NEURO: alert & oriented x 3 with fluent speech, no focal motor/sensory deficits, gait normal   LABORATORY DATA: I have reviewed the data as listed. CBC    Component Value Date/Time   WBC 10.7 (H) 09/12/2016 1228   RBC 4.13 09/12/2016 1228   HGB 12.3 09/12/2016 1228   HCT 39.2 09/12/2016 1228   PLT 303 09/12/2016 1228   MCV 94.9 09/12/2016 1228   MCH 29.8 09/12/2016 1228   MCHC 31.4 09/12/2016 1228   RDW 18.7 (H) 09/12/2016 1228   LYMPHSABS 1.2 09/12/2016 1228   MONOABS 0.9 09/12/2016 1228   EOSABS 0.3 09/12/2016 1228   BASOSABS 0.1 09/12/2016 1228      Chemistry      Component Value Date/Time   NA  135 07/12/2016 1225   K 4.2 07/12/2016 1225   CL 99 (L) 07/12/2016 1225   CO2 26 07/12/2016 1225   BUN 19 07/12/2016 1225   CREATININE 1.09 (H) 07/12/2016 1225   CREATININE 1.07 (H) 09/16/2015 0855      Component Value Date/Time   CALCIUM 9.1  07/12/2016 1225   ALKPHOS 95 07/12/2016 1225   AST 29 07/12/2016 1225   ALT 23 07/12/2016 1225   BILITOT 0.5 07/12/2016 1225     Results for JIAYI, ELICK (MRN [id number])   Ref. Range 02/08/2016 10:40 03/10/2016 09:30 04/11/2016 11:10 05/12/2016 10:58 06/08/2016 10:54 07/12/2016 12:30 08/11/2016 12:03 09/12/2016 12:28  Ferritin Latest Ref Range: 11 - 307 ng/mL 23 12 38 63 19 69 15 223   ASSESSMENT AND PLAN:  Iron deficiency anemia, secondary to chronic GI related blood loss GAVE syndrome Weight loss, unintentional  CBC is improved. We are planning on monitoring a CBC and ferritin monthly moving forward in order to establish iron needs moving forward.  She notices a significant change in her PS with low iron and H/H.  She continues to follow with Dr. Laural Golden. Upcoming EGD in the next few weeks.   I will see her again in 6 months for follow up. In the interim, she will return monthly for CBC and ferritin level check.  We will replace her iron IV accordingly.   Patient is well. She will be having a blood work up today. Ferritin should result tomorrow and Shyree will be called with results. She may be brought in for injectafer.   Orders Placed This Encounter  Procedures  . CBC with Differential    Standing Status:   Standing    Number of Occurrences:   24    Standing Expiration Date:   09/12/2017  . Ferritin    Standing Status:   Standing    Number of Occurrences:   24    Standing Expiration Date:   09/12/2017    All questions were answered. The patient knows to call the clinic with any problems, questions or concerns. We can certainly see the patient much sooner if necessary.  This document serves as a record of services personally performed by Ancil Linsey, MD. It was created on her behalf by Elmyra Ricks, a trained medical scribe. The creation of this record is based on the scribe's personal observations and the provider's statements to them. This document has been checked and approved by  the attending provider.  I have reviewed the above documentation for accuracy and completeness, and I agree with the above. Molli Hazard, MD   09/12/2016 1:43 PM

## 2016-09-13 ENCOUNTER — Other Ambulatory Visit: Payer: Self-pay | Admitting: *Deleted

## 2016-09-13 MED ORDER — METOPROLOL TARTRATE 25 MG PO TABS: 25.0000 mg | ORAL_TABLET | Freq: Every day | ORAL | 1 refills | Status: DC

## 2016-09-14 ENCOUNTER — Other Ambulatory Visit (HOSPITAL_COMMUNITY)
Admission: RE | Admit: 2016-09-14 | Discharge: 2016-09-14 | Disposition: A | Discharge status: Home / Self Care | Payer: Medicare Other | Source: Ambulatory Visit | Attending: Neurosurgery | Admitting: Neurosurgery

## 2016-09-14 DIAGNOSIS — D649 Anemia, unspecified: Secondary | ICD-10-CM | POA: Diagnosis not present

## 2016-09-14 DIAGNOSIS — M47816 Spondylosis without myelopathy or radiculopathy, lumbar region: Secondary | ICD-10-CM | POA: Diagnosis not present

## 2016-09-14 DIAGNOSIS — M4302 Spondylolysis, cervical region: Secondary | ICD-10-CM | POA: Diagnosis not present

## 2016-09-14 LAB — VITAMIN B12: VITAMIN B 12: 596 pg/mL (ref 180–914)

## 2016-10-08 ENCOUNTER — Encounter (HOSPITAL_COMMUNITY): Payer: Self-pay | Admitting: Hematology & Oncology

## 2016-10-10 ENCOUNTER — Encounter (HOSPITAL_COMMUNITY): Payer: Medicare Other | Attending: Hematology & Oncology

## 2016-10-10 DIAGNOSIS — K31819 Angiodysplasia of stomach and duodenum without bleeding: Secondary | ICD-10-CM

## 2016-10-10 DIAGNOSIS — D500 Iron deficiency anemia secondary to blood loss (chronic): Secondary | ICD-10-CM | POA: Diagnosis not present

## 2016-10-10 LAB — FERRITIN: FERRITIN: 47 ng/mL (ref 11–307)

## 2016-10-10 LAB — CBC WITH DIFFERENTIAL/PLATELET
BASOS ABS: 0 10*3/uL (ref 0.0–0.1)
BASOS PCT: 0 %
EOS ABS: 0 10*3/uL (ref 0.0–0.7)
Eosinophils Relative: 0 %
HEMATOCRIT: 39.8 % (ref 36.0–46.0)
HEMOGLOBIN: 12.9 g/dL (ref 12.0–15.0)
Lymphocytes Relative: 4 %
Lymphs Abs: 0.6 10*3/uL — ABNORMAL LOW (ref 0.7–4.0)
MCH: 30.6 pg (ref 26.0–34.0)
MCHC: 32.4 g/dL (ref 30.0–36.0)
MCV: 94.3 fL (ref 78.0–100.0)
MONOS PCT: 13 %
Monocytes Absolute: 1.9 10*3/uL — ABNORMAL HIGH (ref 0.1–1.0)
NEUTROS ABS: 11.7 10*3/uL — AB (ref 1.7–7.7)
NEUTROS PCT: 83 %
Platelets: 325 10*3/uL (ref 150–400)
RBC: 4.22 MIL/uL (ref 3.87–5.11)
RDW: 18.3 % — ABNORMAL HIGH (ref 11.5–15.5)
WBC: 14.2 10*3/uL — AB (ref 4.0–10.5)

## 2016-10-11 ENCOUNTER — Ambulatory Visit (HOSPITAL_COMMUNITY)
Admission: RE | Admit: 2016-10-11 | Discharge: 2016-10-11 | Disposition: A | Discharge status: Home / Self Care | Payer: Medicare Other | Source: Ambulatory Visit | Attending: Internal Medicine | Admitting: Internal Medicine

## 2016-10-11 ENCOUNTER — Encounter (HOSPITAL_COMMUNITY): Payer: Self-pay

## 2016-10-11 ENCOUNTER — Encounter (HOSPITAL_COMMUNITY)
Admission: RE | Disposition: A | Discharge status: Home / Self Care | Payer: Self-pay | Source: Ambulatory Visit | Attending: Internal Medicine

## 2016-10-11 DIAGNOSIS — K31811 Angiodysplasia of stomach and duodenum with bleeding: Secondary | ICD-10-CM | POA: Diagnosis not present

## 2016-10-11 DIAGNOSIS — E039 Hypothyroidism, unspecified: Secondary | ICD-10-CM | POA: Insufficient documentation

## 2016-10-11 DIAGNOSIS — Z90710 Acquired absence of both cervix and uterus: Secondary | ICD-10-CM | POA: Diagnosis not present

## 2016-10-11 DIAGNOSIS — Z882 Allergy status to sulfonamides status: Secondary | ICD-10-CM | POA: Insufficient documentation

## 2016-10-11 DIAGNOSIS — K317 Polyp of stomach and duodenum: Secondary | ICD-10-CM | POA: Diagnosis not present

## 2016-10-11 DIAGNOSIS — K31819 Angiodysplasia of stomach and duodenum without bleeding: Secondary | ICD-10-CM | POA: Diagnosis not present

## 2016-10-11 DIAGNOSIS — I10 Essential (primary) hypertension: Secondary | ICD-10-CM | POA: Insufficient documentation

## 2016-10-11 DIAGNOSIS — Z881 Allergy status to other antibiotic agents status: Secondary | ICD-10-CM | POA: Insufficient documentation

## 2016-10-11 DIAGNOSIS — Z9981 Dependence on supplemental oxygen: Secondary | ICD-10-CM | POA: Diagnosis not present

## 2016-10-11 DIAGNOSIS — Z880 Allergy status to penicillin: Secondary | ICD-10-CM | POA: Diagnosis not present

## 2016-10-11 DIAGNOSIS — Z888 Allergy status to other drugs, medicaments and biological substances status: Secondary | ICD-10-CM | POA: Insufficient documentation

## 2016-10-11 HISTORY — PX: HOT HEMOSTASIS: SHX5433

## 2016-10-11 HISTORY — PX: ESOPHAGOGASTRODUODENOSCOPY: SHX5428

## 2016-10-11 SURGERY — EGD (ESOPHAGOGASTRODUODENOSCOPY)
Anesthesia: Moderate Sedation

## 2016-10-11 MED ORDER — SODIUM CHLORIDE 0.9 % IV SOLN
INTRAVENOUS | Status: DC
  Administered 2016-10-11: 1000 mL via INTRAVENOUS

## 2016-10-11 MED ORDER — MEPERIDINE HCL 50 MG/ML IJ SOLN
INTRAMUSCULAR | Status: DC
Start: 2016-10-11 — End: 2016-10-11
  Filled 2016-10-11: qty 1

## 2016-10-11 MED ORDER — BUTAMBEN-TETRACAINE-BENZOCAINE 2-2-14 % EX AERO
INHALATION_SPRAY | CUTANEOUS | Status: DC | PRN
  Administered 2016-10-11: 2 via TOPICAL

## 2016-10-11 MED ORDER — MEPERIDINE HCL 50 MG/ML IJ SOLN
INTRAMUSCULAR | Status: DC | PRN
  Administered 2016-10-11: 10 mg via INTRAVENOUS
  Administered 2016-10-11: 25 mg via INTRAVENOUS
  Administered 2016-10-11: 15 mg via INTRAVENOUS

## 2016-10-11 MED ORDER — STERILE WATER FOR IRRIGATION IR SOLN
Status: DC | PRN
  Administered 2016-10-11: 12:00:00

## 2016-10-11 MED ORDER — MIDAZOLAM HCL 5 MG/5ML IJ SOLN
INTRAMUSCULAR | Status: DC | PRN
  Administered 2016-10-11: 2 mg via INTRAVENOUS
  Administered 2016-10-11 (×2): 1 mg via INTRAVENOUS

## 2016-10-11 MED ORDER — MIDAZOLAM HCL 5 MG/5ML IJ SOLN
INTRAMUSCULAR | Status: AC
  Filled 2016-10-11: qty 10

## 2016-10-11 NOTE — H&P (Signed)
[name] is an 82 y.o. female.   Chief Complaint: Patient's here for EGD and APC of GAVE. HPI: Patient is 82 year old Caucasian female was history of GI bleed secondary to gastric antral vascular KCl as well as iron deficiency anemia. She was seen in the office but 3 months ago for melena and heme positive stool. Scheduled but was postponed she developed pneumonia. She is maintaining her H&H with intermittent iron infusion. He has lost 50 pounds last 4 years because of dental problems. Weight has now leveled off. She does not take OTC NSAIDs.  Past Medical History:  Diagnosis Date  . Anemia   . GAVE (gastric antral vascular ectasia) 09/12/12  . Hypertension   . Hypothyroidism   . Iron deficiency anemia 10/15/2012  . Iron deficiency anemia due to chronic blood loss 04/20/2014  . On home O2    wears 2L/[place] at night  . Palpitations     Past Surgical History:  Procedure Laterality Date  . ABDOMINAL HYSTERECTOMY    . back surger x 2    . COLONOSCOPY  07/24/2012   Procedure: COLONOSCOPY;  Surgeon: Rogene Houston, MD;  [facility];  Service: Endoscopy;  Laterality: N/A;  200  . ESOPHAGOGASTRODUODENOSCOPY  09/12/2012   Procedure: ESOPHAGOGASTRODUODENOSCOPY (EGD);  Surgeon: Rogene Houston, MD;  [facility];  Service: Endoscopy;  Laterality: N/A;  325-changed to 200 per Ann-pt moved up to [place] to notify pt  . ESOPHAGOGASTRODUODENOSCOPY  11/01/2012   Procedure: ESOPHAGOGASTRODUODENOSCOPY (EGD);  Surgeon: Rogene Houston, MD;  [facility];  Service: Endoscopy;  Laterality: N/A;  1:20  . ESOPHAGOGASTRODUODENOSCOPY N/A 07/04/2013   Procedure: ESOPHAGOGASTRODUODENOSCOPY (EGD);  Surgeon: Rogene Houston, MD;  [facility];  Service: Endoscopy;  Laterality: N/A;  850  . ESOPHAGOGASTRODUODENOSCOPY N/A 08/06/2013   Procedure: ESOPHAGOGASTRODUODENOSCOPY (EGD);  Surgeon: Rogene Houston, MD;  [facility];  Service: Endoscopy;  Laterality: N/A;   1200  . ESOPHAGOGASTRODUODENOSCOPY N/A 10/31/2013   Procedure: ESOPHAGOGASTRODUODENOSCOPY (EGD);  Surgeon: Rogene Houston, MD;  [facility];  Service: Endoscopy;  Laterality: N/A;  125-moved to [place] notified pt  . ESOPHAGOGASTRODUODENOSCOPY N/A 10/23/2014   Procedure: ESOPHAGOGASTRODUODENOSCOPY (EGD);  Surgeon: Rogene Houston, MD;  [facility];  Service: Endoscopy;  Laterality: N/A;  1020  . HEEL SPUR SURGERY    . HOT HEMOSTASIS  11/01/2012   Procedure: HOT HEMOSTASIS (ARGON PLASMA COAGULATION/BICAP);  Surgeon: Rogene Houston, MD;  [facility];  Service: Endoscopy;  Laterality: N/A;  . HOT HEMOSTASIS N/A 07/04/2013   Procedure: HOT HEMOSTASIS (ARGON PLASMA COAGULATION/BICAP);  Surgeon: Rogene Houston, MD;  [facility];  Service: Endoscopy;  Laterality: N/A;  . HOT HEMOSTASIS N/A 08/06/2013   Procedure: HOT HEMOSTASIS (ARGON PLASMA COAGULATION/BICAP);  Surgeon: Rogene Houston, MD;  [facility];  Service: Endoscopy;  Laterality: N/A;  . HOT HEMOSTASIS N/A 10/31/2013   Procedure: HOT HEMOSTASIS (ARGON PLASMA COAGULATION/BICAP);  Surgeon: Rogene Houston, MD;  [facility];  Service: Endoscopy;  Laterality: N/A;  . HOT HEMOSTASIS N/A 10/23/2014   Procedure: HOT HEMOSTASIS (ARGON PLASMA COAGULATION/BICAP);  Surgeon: Rogene Houston, MD;  [facility];  Service: Endoscopy;  Laterality: N/A;  . knee replacement rt 3 yrs ago    . LUMBAR FUSION  06/25/14   L4 & L5  . SHOULDER SURGERY    . TONSILLECTOMY AND ADENOIDECTOMY    .  TOTAL ABDOMINAL HYSTERECTOMY    . US ECHOCARDIOGRAPHY  03/12/2006   EF 55-60%    Family History  Problem Relation Age of Onset  . Hypertension Mother   . Hypertension Father   . Heart attack Father   . Other Sister     aneurysm on heart  . Other Brother     aneurysm on heart   Social History:  reports that she has never smoked. She has never used smokeless tobacco. She reports that  she does not drink alcohol or use drugs.  Allergies:  Allergies  Allergen Reactions  . Avapro [Irbesartan] Other (See Comments)    Cough   . Clarithromycin Other (See Comments)    Unknown   . Losartan Potassium-Hctz Other (See Comments)    Unknown  . Maxzide [Hydrochlorothiazide W-Triamterene] Other (See Comments)    Unknown   . Ziac [Bisoprolol-Hydrochlorothiazide]     Thinks caused itching and cough 08/12/12  . Penicillins Swelling and Rash    Has patient had a PCN reaction causing immediate rash, facial/tongue/throat swelling, SOB or lightheadedness with hypotension: no Has patient had a PCN reaction causing severe rash involving mucus membranes or skin necrosis: no Has patient had a PCN reaction that required hospitalization {no Has patient had a PCN reaction occurring within the last 10 years: {no If all of the above answers are "NO", then may proceed with Cephalosporin use.  . Sulfa Drugs Cross Reactors Rash    Medications Prior to Admission  Medication Sig Dispense Refill  . amLODipine (NORVASC) 2.5 MG tablet Take 1 tablet (2.5 mg total) by mouth daily. 90 tablet 3  . Cholecalciferol (VITAMIN D-3) 1000 UNITS CAPS Take 2,000 Units by mouth daily.     . digoxin (DIGOX) 0.125 MG tablet Take 1 tablet mouth daily except none on Sundays or as directed 30 tablet 5  . hydrALAZINE (APRESOLINE) 10 MG tablet Take 1 tablet (10 mg total) by mouth 3 (three) times daily. 270 tablet 3  . levothyroxine (SYNTHROID, LEVOTHROID) 112 MCG tablet Take 112 mcg by mouth daily before breakfast.    . metoprolol tartrate (LOPRESSOR) 25 MG tablet Take 1 tablet (25 mg total) by mouth daily. 90 tablet 1  . pantoprazole (PROTONIX) 40 MG tablet TAKE 1 TABLET BY MOUTH EVERY DAY 90 tablet 3  . polyethylene glycol powder (GLYCOLAX/MIRALAX) powder Take 17 g by mouth daily as needed for moderate constipation.     Marland Kitchen albuterol (PROVENTIL) (2.5 MG/3ML) 0.083% nebulizer solution Take 2.5 mg by nebulization every 6  (six) hours as needed for wheezing or shortness of breath.      Results for orders placed or performed in visit on 10/10/16 (from the past 48 hour(s))  CBC with Differential     Status: Abnormal   Collection Time: 10/10/16 10:58 AM  Result Value Ref Range   WBC 14.2 (H) 4.0 - 10.5 K/uL   RBC 4.22 3.87 - 5.11 MIL/uL   Hemoglobin 12.9 12.0 - 15.0 g/dL   HCT 39.8 36.0 - 46.0 %   MCV 94.3 78.0 - 100.0 fL   MCH 30.6 26.0 - 34.0 pg   MCHC 32.4 30.0 - 36.0 g/dL   RDW 18.3 (H) 11.5 - 15.5 %   Platelets 325 150 - 400 K/uL   Neutrophils Relative % 83 %   Neutro Abs 11.7 (H) 1.7 - 7.7 K/uL   Lymphocytes Relative 4 %   Lymphs Abs 0.6 (L) 0.7 - 4.0 K/uL   Monocytes Relative 13 %   Monocytes  Absolute 1.9 (H) 0.1 - 1.0 K/uL   Eosinophils Relative 0 %   Eosinophils Absolute 0.0 0.0 - 0.7 K/uL   Basophils Relative 0 %   Basophils Absolute 0.0 0.0 - 0.1 K/uL  Ferritin     Status: None   Collection Time: 10/10/16 10:59 AM  Result Value Ref Range   Ferritin 47 11 - 307 ng/mL    Comment: Performed at [facility]   No results found.  ROS  Blood pressure (!) 175/75, pulse 69, temperature 97.4 F (36.3 C), temperature source Oral, resp. rate 16, height 5\' 1"  (1.549 m), weight 130 lb (59 kg), SpO2 93 %. Physical Exam  Constitutional: She appears well-developed and well-nourished.  HENT:  Mouth/Throat: Oropharynx is clear and moist.  Eyes: Conjunctivae are normal. No scleral icterus.  Neck: No thyromegaly present.  Cardiovascular: Normal rate, regular rhythm and normal heart sounds.   No murmur heard. Respiratory: Effort normal and breath sounds normal.  GI: Soft. She exhibits no distension and no mass. There is no tenderness.  Musculoskeletal: She exhibits no edema.  Lymphadenopathy:    She has no cervical adenopathy.  Neurological: She is alert.  Skin: Skin is warm and dry.     Assessment/Plan Chronic GI bleed secondary to GAVE. Iron deficiency anemia secondary to above  corrected with iron infusion. EGD with APC of GAVE.  Hildred Laser, MD 10/11/2016, 12:02 PM

## 2016-10-11 NOTE — Op Note (Signed)
[facility] Patient Name: [name] Procedure Date: 10/11/2016 12:07 PM MRN: [id number] Date of Birth: Jan 27, 1933 Attending MD: Hildred Laser , MD CSN: [id number] Age: 82 Admit Type: Outpatient Procedure:                Upper GI endoscopy Indications:              Therapeutic procedure for GAVE Providers:                Hildred Laser, MD, [place]is [place] B. Sharon Seller, RN, Bonnetta Barry, Technician Referring MD:             [name], MD Medicines:                Cetacaine spray, Meperidine 50 mg IV, Midazolam 4                            mg IV Complications:            No immediate complications. Estimated Blood Loss:     Estimated blood loss: none. Procedure:                Pre-Anesthesia Assessment:                           - Prior to the procedure, a History and Physical                            was performed, and patient medications and                            allergies were reviewed. The patient's tolerance of                            previous anesthesia was also reviewed. The risks                            and benefits of the procedure and the sedation                            options and risks were discussed with the patient.                            All questions were answered, and informed consent                            was obtained. Prior Anticoagulants: The patient has                            taken no previous anticoagulant or antiplatelet                            agents. ASA Grade Assessment: III - A patient with  severe systemic disease. After reviewing the risks                            and benefits, the patient was deemed in                            satisfactory condition to undergo the procedure.                           After obtaining informed consent, the endoscope was                            passed under direct vision. Throughout the                            procedure, the patient's  blood pressure, pulse, and                            oxygen saturations were monitored continuously. The                            [id number]) was introduced through the                            mouth, and advanced to the second part of duodenum.                            The upper GI endoscopy was accomplished without                            difficulty. The patient tolerated the procedure                            well. Scope In: 12:15:40 PM Scope Out: 12:30:27 PM Total Procedure Duration: 0 hours 14 minutes 47 seconds  Findings:      The examined esophagus was normal.      The Z-line was regular and was found 39 cm from the incisors.      A few 3 to 4 mm sessile polyps with no stigmata of recent bleeding were       found in the gastric fundus and in the gastric body.      Severe gastric antral vascular ectasia with bleeding was present in the       gastric antrum and in the prepyloric region of the stomach. Coagulation       for bleeding prevention using argon plasma was successful.      Mild gastric antral vascular ectasia without bleeding was present in the       cardia.      The duodenal bulb and second portion of the duodenum were normal. Impression:               - Normal esophagus.                           - Z-line regular, 39 cm from the incisors.                           -  A few gastric polyps.                           - Gastric antral vascular ectasia with bleeding.                            Treated with argon plasma coagulation (APC).                            Majority of the lesions were treated including the                            ones that were bleeding.                           - Gastric antral vascular ectasia without bleeding.                           - Normal duodenal bulb and second portion of the                            duodenum.                           - No specimens collected. Moderate Sedation:      Moderate (conscious) sedation  was administered by the endoscopy nurse       and supervised by the endoscopist. The following parameters were       monitored: oxygen saturation, heart rate, blood pressure, CO2       capnography and response to care. Total physician intraservice time was       21 minutes. Recommendation:           - Patient has a contact number available for                            emergencies. The signs and symptoms of potential                            delayed complications were discussed with the                            patient. Return to normal activities tomorrow.                            Written discharge instructions were provided to the                            patient.                           - Resume previous diet today.                           - Continue present medications.                           -  Repeat upper endoscopy in 3 months. Procedure Code(s):        --- Professional ---                           [phone], Esophagogastroduodenoscopy, flexible,                            transoral; with control of bleeding, any method                           [id number], Moderate sedation services provided by the                            same physician or other qualified health care                            professional performing the diagnostic or                            therapeutic service that the sedation supports,                            requiring the presence of an independent trained                            observer to assist in the monitoring of the                            patient's level of consciousness and physiological                            status; initial 15 minutes of intraservice time,                            patient age 58 years or older Diagnosis Code(s):        --- Professional ---                           K31.7, Polyp of stomach and duodenum                           K31.811, Angiodysplasia of stomach and duodenum                            with  bleeding CPT copyright 2016 [employer]. All rights reserved. The codes documented in this report are preliminary and upon coder review may  be revised to meet current compliance requirements. Hildred Laser, MD Hildred Laser, MD 10/11/2016 12:39:20 PM This report has been signed electronically. Number of Addenda: 0

## 2016-10-11 NOTE — Discharge Instructions (Signed)
Resume usual medications and diet. No driving for 24 hours. Repeat EGD in 3 months.  Esophagogastroduodenoscopy, Care After Introduction Refer to this sheet in the next few weeks. These instructions provide you with information about caring for yourself after your procedure. Your health care provider may also give you more specific instructions. Your treatment has been planned according to current medical practices, but problems sometimes occur. Call your health care provider if you have any problems or questions after your procedure. What can I expect after the procedure? After the procedure, it is common to have:  A sore throat.  Nausea.  Bloating.  Dizziness.  Fatigue. Follow these instructions at home:  Do not eat or drink anything until the numbing medicine (local anesthetic) has worn off and your gag reflex has returned. You will know that the local anesthetic has worn off when you can swallow comfortably.  Do not drive for 24 hours if you received a medicine to help you relax (sedative).  If your health care provider took a tissue sample for testing during the procedure, make sure to get your test results. This is your responsibility. Ask your health care provider or the department performing the test when your results will be ready.  Keep all follow-up visits as told by your health care provider. This is important. Contact a health care provider if:  You cannot stop coughing.  You are not urinating.  You are urinating less than usual. Get help right away if:  You have trouble swallowing.  You cannot eat or drink.  You have throat or chest pain that gets worse.  You are dizzy or light-headed.  You faint.  You have nausea or vomiting.  You have chills.  You have a fever.  You have severe abdominal pain.  You have black, tarry, or bloody stools. This information is not intended to replace advice given to you by your health care provider. Make sure you  discuss any questions you have with your health care provider. Document Released: 10/16/2012 Document Revised: 04/06/2016 Document Reviewed: 09/23/2015  2017 Elsevier

## 2016-10-12 ENCOUNTER — Other Ambulatory Visit (HOSPITAL_COMMUNITY): Payer: Medicare Other

## 2016-10-13 ENCOUNTER — Encounter (HOSPITAL_COMMUNITY): Payer: Self-pay | Admitting: Internal Medicine

## 2016-10-16 ENCOUNTER — Telehealth (HOSPITAL_COMMUNITY): Payer: Self-pay

## 2016-10-16 NOTE — Telephone Encounter (Signed)
-----   Message from Louis Meckel sent at 10/16/2016 12:41 PM EST ----- Regarding: lab results Please call patient with lab results

## 2016-10-16 NOTE — Telephone Encounter (Signed)
Called patient and notified her that her Ferritin had dropped but was still WNL. She verbalized understanding.

## 2016-10-19 ENCOUNTER — Encounter: Payer: Self-pay | Admitting: Cardiology

## 2016-10-21 NOTE — Progress Notes (Signed)
Cardiology Office Note   Date:  10/24/2016   ID:  Rachale, Angelucci 11/01/1933, MRN [id number]  PCP:  Wende Neighbors, MD  Cardiologist: Peter Martinique MD  Chief Complaint  Patient presents with  . Follow-up  . Hypertension      History of Present Illness: [name] is a 82 y.o. female who presents for follow up HTN and PACs.  She is a retired Marine scientist.  She has a past history of essential hypertension and a history of frequent premature atrial beats. She had a nuclear stress test on 12/21/11. She was found to have no ischemia and her ejection fraction was 83% by Myoview.  The patient had an echocardiogram on 09/24/12 which showed normal left ventricular systolic function with an ejection fraction of 55-60%. She had moderate pulmonary hypertension with a pulmonary artery pressure of 54 and she has had a subsequent sleep study and is being followed by pulmonary. She had an updated echocardiogram on 01/29/14 which showed an ejection fraction of 55-60% with grade 2 diastolic dysfunction and a pulmonary artery pressure of 47 which was down from a previous level of 54. There was mild aortic insufficiency. She is still using oxygen 2 L per minute at night but does not have to use it during the day when she is more active. She has a history of GAVE. She had an endoscopy in December 2015. Her Hgb has been maintained with periodic iron infusions.  She has a family history of aortic aneurysm. A CT of the chest/abdomen October 2015 showed no evidence of aneurysm. On October 11, 2016 she had upper EGD showing severe GAVE and she had coagulation with argon plasma for bleeding prophylaxis.  On follow up today she reports she had PNA in August with pleurisy. Treated with Levaquin. 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 Still feels her palpitations without dizziness. No significant chest pain or dyspnea.    Past Medical History:  Diagnosis Date  . Anemia   . GAVE (gastric antral vascular  ectasia) 09/12/12  . Hypertension   . Hypothyroidism   . Iron deficiency anemia 10/15/2012  . Iron deficiency anemia due to chronic blood loss 04/20/2014  . On home O2    wears 2L/[place] at night  . Palpitations     Past Surgical History:  Procedure Laterality Date  . ABDOMINAL HYSTERECTOMY    . back surger x 2    . COLONOSCOPY  07/24/2012   Procedure: COLONOSCOPY;  Surgeon: Rogene Houston, MD;  [facility];  Service: Endoscopy;  Laterality: N/A;  200  . ESOPHAGOGASTRODUODENOSCOPY  09/12/2012   Procedure: ESOPHAGOGASTRODUODENOSCOPY (EGD);  Surgeon: Rogene Houston, MD;  [facility];  Service: Endoscopy;  Laterality: N/A;  325-changed to 200 per Ann-pt moved up to [place] to notify pt  . ESOPHAGOGASTRODUODENOSCOPY  11/01/2012   Procedure: ESOPHAGOGASTRODUODENOSCOPY (EGD);  Surgeon: Rogene Houston, MD;  [facility];  Service: Endoscopy;  Laterality: N/A;  1:20  . ESOPHAGOGASTRODUODENOSCOPY N/A 07/04/2013   Procedure: ESOPHAGOGASTRODUODENOSCOPY (EGD);  Surgeon: Rogene Houston, MD;  [facility];  Service: Endoscopy;  Laterality: N/A;  850  . ESOPHAGOGASTRODUODENOSCOPY N/A 08/06/2013   Procedure: ESOPHAGOGASTRODUODENOSCOPY (EGD);  Surgeon: Rogene Houston, MD;  [facility];  Service: Endoscopy;  Laterality: N/A;  1200  . ESOPHAGOGASTRODUODENOSCOPY N/A 10/31/2013   Procedure: ESOPHAGOGASTRODUODENOSCOPY (EGD);  Surgeon: Rogene Houston, MD;  [facility];  Service: Endoscopy;  Laterality: N/A;  125-moved to [place] notified pt  . ESOPHAGOGASTRODUODENOSCOPY N/A 10/23/2014   Procedure: ESOPHAGOGASTRODUODENOSCOPY (EGD);  Surgeon: Rogene Houston, MD;  [facility];  Service: Endoscopy;  Laterality: N/A;  1020  . ESOPHAGOGASTRODUODENOSCOPY N/A 10/11/2016   Procedure: ESOPHAGOGASTRODUODENOSCOPY (EGD);  Surgeon: Rogene Houston, MD;  [facility];  Service: Endoscopy;  Laterality: N/A;  245  . HEEL SPUR SURGERY     . HOT HEMOSTASIS  11/01/2012   Procedure: HOT HEMOSTASIS (ARGON PLASMA COAGULATION/BICAP);  Surgeon: Rogene Houston, MD;  [facility];  Service: Endoscopy;  Laterality: N/A;  . HOT HEMOSTASIS N/A 07/04/2013   Procedure: HOT HEMOSTASIS (ARGON PLASMA COAGULATION/BICAP);  Surgeon: Rogene Houston, MD;  [facility];  Service: Endoscopy;  Laterality: N/A;  . HOT HEMOSTASIS N/A 08/06/2013   Procedure: HOT HEMOSTASIS (ARGON PLASMA COAGULATION/BICAP);  Surgeon: Rogene Houston, MD;  [facility];  Service: Endoscopy;  Laterality: N/A;  . HOT HEMOSTASIS N/A 10/31/2013   Procedure: HOT HEMOSTASIS (ARGON PLASMA COAGULATION/BICAP);  Surgeon: Rogene Houston, MD;  [facility];  Service: Endoscopy;  Laterality: N/A;  . HOT HEMOSTASIS N/A 10/23/2014   Procedure: HOT HEMOSTASIS (ARGON PLASMA COAGULATION/BICAP);  Surgeon: Rogene Houston, MD;  [facility];  Service: Endoscopy;  Laterality: N/A;  . HOT HEMOSTASIS N/A 10/11/2016   Procedure: HOT HEMOSTASIS (ARGON PLASMA COAGULATION/BICAP);  Surgeon: Rogene Houston, MD;  [facility];  Service: Endoscopy;  Laterality: N/A;  . knee replacement rt 3 yrs ago    . LUMBAR FUSION  06/25/14   L4 & L5  . SHOULDER SURGERY    . TONSILLECTOMY AND ADENOIDECTOMY    . TOTAL ABDOMINAL HYSTERECTOMY    . US ECHOCARDIOGRAPHY  03/12/2006   EF 55-60%     Current Outpatient Prescriptions  Medication Sig Dispense Refill  . albuterol (PROVENTIL) (2.5 MG/3ML) 0.083% nebulizer solution Take 2.5 mg by nebulization every 6 (six) hours as needed for wheezing or shortness of breath.    Marland Kitchen amLODipine (NORVASC) 2.5 MG tablet Take 1 tablet (2.5 mg total) by mouth daily. 90 tablet 3  . Cholecalciferol (VITAMIN D-3) 1000 UNITS CAPS Take 2,000 Units by mouth daily.     . digoxin (DIGOX) 0.125 MG tablet Take 1 tablet mouth daily except none on Sundays or as directed 30 tablet 5  . hydrALAZINE (APRESOLINE) 10 MG tablet Take 1  tablet (10 mg total) by mouth 3 (three) times daily. 270 tablet 3  . levothyroxine (SYNTHROID, LEVOTHROID) 112 MCG tablet Take 112 mcg by mouth daily before breakfast.    . metoprolol tartrate (LOPRESSOR) 25 MG tablet Take 1 tablet (25 mg total) by mouth daily. 90 tablet 1  . pantoprazole (PROTONIX) 40 MG tablet TAKE 1 TABLET BY MOUTH EVERY DAY 90 tablet 3  . polyethylene glycol powder (GLYCOLAX/MIRALAX) powder Take 17 g by mouth daily as needed for moderate constipation.      No current facility-administered medications for this visit.     Allergies:   Avapro [irbesartan]; Clarithromycin; Losartan potassium-hctz; Maxzide [hydrochlorothiazide w-triamterene]; Ziac [bisoprolol-hydrochlorothiazide]; Penicillins; and Sulfa drugs cross reactors    Social History:  The patient  reports that she has never smoked. She has never used smokeless tobacco. She reports that she does not drink alcohol or use drugs.   Family History:  The patient's family history includes Heart attack in her father; Hypertension in her father and mother; Other in her brother and sister.  ROS:  Please see the history of present illness.   Otherwise, review of systems are positive for none.   All other systems are reviewed and negative.    PHYSICAL EXAM: VS:  BP 136/70   Pulse 78   Ht 5\' 1"  (1.549 m)   Wt 125 lb 3.2 oz (56.8 kg)   BMI 23.66 kg/m  , BMI Body mass index is 23.66 kg/m. GEN: Well nourished, well developed, in no acute distress  HEENT: normal  Neck: no JVD, carotid bruits, or masses Cardiac: RRR; no murmurs, rubs, or gallops,no edema. Legs are big without pitting.  Respiratory:  clear to auscultation bilaterally, normal work of breathing GI: soft, nontender, nondistended, + BS MS: no deformity or atrophy  Skin: warm and dry, no rash Neuro:  Strength and sensation are intact Psych: euthymic mood, full affect   EKG:  EKG  ordered today. NSR with nonspecific ST abnormality. I have personally reviewed  and interpreted this study.   Recent Labs: 06/08/2016: TSH 0.863 07/12/2016: ALT 23; BUN 19; Creatinine, Ser 1.09; Potassium 4.2; Sodium 135 10/10/2016: Hemoglobin 12.9; Platelets 325    Lipid Panel    Component Value Date/Time   CHOL 146 09/16/2015 0855   TRIG 106 09/16/2015 0855   HDL 48 09/16/2015 0855   CHOLHDL 3.0 09/16/2015 0855   VLDL 21 09/16/2015 0855   LDLCALC 77 09/16/2015 0855      Wt Readings from Last 3 Encounters:  10/24/16 125 lb 3.2 oz (56.8 kg)  10/11/16 130 lb (59 kg)  09/12/16 128 lb 8 oz (58.3 kg)     Labs reviewed from 11/27/15: Hgb 11.0. TSH normal. Cholesterol 130, trig-92, HDL 48, LDL 64. Dig level low in October2016.  Dated 07/12/16: BUN 19, creatinine 1.09. Other CMET normal 06/08/16: TSH is normal.   ASSESSMENT AND PLAN:   1. Benign hypertensive heart disease without heart failure. BP is well controlled.  2. GAVE (gastric antral vascular ectasia)- serial EGDs. No gross bleeding. 3. Chronic iron deficiency anemia secondary to gastric antral vascular ectasia. On periodic iron infusions.  4. Pulmonary hypertension 5. Past history of frequent PACs, well controlled with Dig and metoprolol.   Plan: Continue on same medication. Follow up 6 months.  Current medicines are reviewed at length with the patient today.  The patient does not have concerns regarding medicines.  The following changes have been made:  no change  Labs/ tests ordered today include: none  No orders of the defined types were placed in this encounter.     Signed, Peter Martinique MD, [facility]    10/24/2016 2:08 PM    [place]

## 2016-10-24 ENCOUNTER — Encounter: Payer: Self-pay | Admitting: Cardiology

## 2016-10-24 ENCOUNTER — Ambulatory Visit (INDEPENDENT_AMBULATORY_CARE_PROVIDER_SITE_OTHER): Payer: Medicare Other | Admitting: Cardiology

## 2016-10-24 VITALS — BP 136/70 | HR 78 | Ht 61.0 in | Wt 125.2 lb

## 2016-10-24 DIAGNOSIS — R002 Palpitations: Secondary | ICD-10-CM

## 2016-10-24 DIAGNOSIS — I119 Hypertensive heart disease without heart failure: Secondary | ICD-10-CM

## 2016-10-24 NOTE — Patient Instructions (Signed)
Continue your current therapy  I will see you in 6 months.   

## 2016-10-26 DIAGNOSIS — H26491 Other secondary cataract, right eye: Secondary | ICD-10-CM | POA: Diagnosis not present

## 2016-10-26 DIAGNOSIS — Z961 Presence of intraocular lens: Secondary | ICD-10-CM | POA: Diagnosis not present

## 2016-10-26 DIAGNOSIS — H43813 Vitreous degeneration, bilateral: Secondary | ICD-10-CM | POA: Diagnosis not present

## 2016-10-26 DIAGNOSIS — H43811 Vitreous degeneration, right eye: Secondary | ICD-10-CM | POA: Diagnosis not present

## 2016-10-27 ENCOUNTER — Other Ambulatory Visit: Payer: Self-pay

## 2016-10-27 MED ORDER — PANTOPRAZOLE SODIUM 40 MG PO TBEC: 40.0000 mg | DELAYED_RELEASE_TABLET | Freq: Every day | ORAL | 3 refills | Status: DC

## 2016-11-06 DIAGNOSIS — J989 Respiratory disorder, unspecified: Secondary | ICD-10-CM | POA: Diagnosis not present

## 2016-11-06 DIAGNOSIS — R404 Transient alteration of awareness: Secondary | ICD-10-CM | POA: Diagnosis not present

## 2016-11-06 DIAGNOSIS — R55 Syncope and collapse: Secondary | ICD-10-CM

## 2016-11-06 DIAGNOSIS — R531 Weakness: Secondary | ICD-10-CM | POA: Diagnosis not present

## 2016-11-06 HISTORY — DX: Syncope and collapse: R55

## 2016-11-07 ENCOUNTER — Emergency Department (HOSPITAL_COMMUNITY): Payer: Medicare Other

## 2016-11-07 ENCOUNTER — Encounter (HOSPITAL_COMMUNITY): Payer: Self-pay | Admitting: Emergency Medicine

## 2016-11-07 ENCOUNTER — Other Ambulatory Visit: Payer: Self-pay

## 2016-11-07 ENCOUNTER — Inpatient Hospital Stay (HOSPITAL_COMMUNITY)
Admission: EM | Admit: 2016-11-07 | Discharge: 2016-11-10 | DRG: 193 | Disposition: A | Discharge status: Home / Self Care | Payer: Medicare Other | Attending: Internal Medicine | Admitting: Internal Medicine

## 2016-11-07 ENCOUNTER — Other Ambulatory Visit (HOSPITAL_COMMUNITY): Payer: Self-pay

## 2016-11-07 ENCOUNTER — Ambulatory Visit (HOSPITAL_COMMUNITY)
Admission: EM | Admit: 2016-11-07 | Discharge: 2016-11-07 | Disposition: A | Discharge status: Nursing Home (Includes ICF) | Payer: Medicare Other | Attending: Emergency Medicine | Admitting: Emergency Medicine

## 2016-11-07 DIAGNOSIS — E43 Unspecified severe protein-calorie malnutrition: Secondary | ICD-10-CM | POA: Diagnosis not present

## 2016-11-07 DIAGNOSIS — R748 Abnormal levels of other serum enzymes: Secondary | ICD-10-CM | POA: Diagnosis present

## 2016-11-07 DIAGNOSIS — R509 Fever, unspecified: Secondary | ICD-10-CM | POA: Diagnosis not present

## 2016-11-07 DIAGNOSIS — Z882 Allergy status to sulfonamides status: Secondary | ICD-10-CM

## 2016-11-07 DIAGNOSIS — E039 Hypothyroidism, unspecified: Secondary | ICD-10-CM | POA: Diagnosis present

## 2016-11-07 DIAGNOSIS — R197 Diarrhea, unspecified: Secondary | ICD-10-CM | POA: Diagnosis present

## 2016-11-07 DIAGNOSIS — R079 Chest pain, unspecified: Secondary | ICD-10-CM

## 2016-11-07 DIAGNOSIS — D649 Anemia, unspecified: Secondary | ICD-10-CM | POA: Diagnosis present

## 2016-11-07 DIAGNOSIS — Z8249 Family history of ischemic heart disease and other diseases of the circulatory system: Secondary | ICD-10-CM

## 2016-11-07 DIAGNOSIS — I119 Hypertensive heart disease without heart failure: Secondary | ICD-10-CM | POA: Diagnosis present

## 2016-11-07 DIAGNOSIS — Z880 Allergy status to penicillin: Secondary | ICD-10-CM

## 2016-11-07 DIAGNOSIS — J9611 Chronic respiratory failure with hypoxia: Secondary | ICD-10-CM | POA: Diagnosis not present

## 2016-11-07 DIAGNOSIS — E860 Dehydration: Secondary | ICD-10-CM | POA: Diagnosis present

## 2016-11-07 DIAGNOSIS — Z9889 Other specified postprocedural states: Secondary | ICD-10-CM

## 2016-11-07 DIAGNOSIS — J189 Pneumonia, unspecified organism: Secondary | ICD-10-CM | POA: Diagnosis not present

## 2016-11-07 DIAGNOSIS — R55 Syncope and collapse: Secondary | ICD-10-CM | POA: Diagnosis not present

## 2016-11-07 DIAGNOSIS — I10 Essential (primary) hypertension: Secondary | ICD-10-CM | POA: Diagnosis present

## 2016-11-07 DIAGNOSIS — I6529 Occlusion and stenosis of unspecified carotid artery: Secondary | ICD-10-CM | POA: Diagnosis present

## 2016-11-07 DIAGNOSIS — Z9981 Dependence on supplemental oxygen: Secondary | ICD-10-CM | POA: Diagnosis not present

## 2016-11-07 DIAGNOSIS — R071 Chest pain on breathing: Secondary | ICD-10-CM | POA: Diagnosis not present

## 2016-11-07 DIAGNOSIS — R0602 Shortness of breath: Secondary | ICD-10-CM | POA: Diagnosis not present

## 2016-11-07 DIAGNOSIS — Z6823 Body mass index (BMI) 23.0-23.9, adult: Secondary | ICD-10-CM

## 2016-11-07 DIAGNOSIS — Z96651 Presence of right artificial knee joint: Secondary | ICD-10-CM | POA: Diagnosis present

## 2016-11-07 DIAGNOSIS — Z888 Allergy status to other drugs, medicaments and biological substances status: Secondary | ICD-10-CM

## 2016-11-07 DIAGNOSIS — R05 Cough: Secondary | ICD-10-CM | POA: Diagnosis not present

## 2016-11-07 DIAGNOSIS — Z90710 Acquired absence of both cervix and uterus: Secondary | ICD-10-CM

## 2016-11-07 DIAGNOSIS — Z981 Arthrodesis status: Secondary | ICD-10-CM

## 2016-11-07 DIAGNOSIS — Z79899 Other long term (current) drug therapy: Secondary | ICD-10-CM

## 2016-11-07 DIAGNOSIS — R002 Palpitations: Secondary | ICD-10-CM | POA: Diagnosis present

## 2016-11-07 DIAGNOSIS — K219 Gastro-esophageal reflux disease without esophagitis: Secondary | ICD-10-CM | POA: Diagnosis present

## 2016-11-07 HISTORY — DX: Unspecified chronic bronchitis: J42

## 2016-11-07 HISTORY — DX: Dependence on supplemental oxygen: Z99.81

## 2016-11-07 HISTORY — DX: Pneumonia, unspecified organism: J18.9

## 2016-11-07 HISTORY — DX: Syncope and collapse: R55

## 2016-11-07 LAB — BASIC METABOLIC PANEL
ANION GAP: 11 (ref 5–15)
BUN: 18 mg/dL (ref 6–20)
CALCIUM: 8.8 mg/dL — AB (ref 8.9–10.3)
CHLORIDE: 97 mmol/L — AB (ref 101–111)
CO2: 26 mmol/L (ref 22–32)
Creatinine, Ser: 1.01 mg/dL — ABNORMAL HIGH (ref 0.44–1.00)
GFR calc non Af Amer: 51 mL/min — ABNORMAL LOW (ref >60)
GFR, EST AFRICAN AMERICAN: 59 mL/min — AB (ref >60)
GLUCOSE: 100 mg/dL — AB (ref 65–99)
POTASSIUM: 4.4 mmol/L (ref 3.5–5.1)
Sodium: 134 mmol/L — ABNORMAL LOW (ref 135–145)

## 2016-11-07 LAB — CREATININE, SERUM
Creatinine, Ser: 0.98 mg/dL (ref 0.44–1.00)
GFR calc Af Amer: >60 mL/min (ref >60)
GFR calc non Af Amer: 53 mL/min — ABNORMAL LOW (ref >60)

## 2016-11-07 LAB — CBC
HCT: 39.7 % (ref 36.0–46.0)
Hemoglobin: 13.4 g/dL (ref 12.0–15.0)
MCH: 30.3 pg (ref 26.0–34.0)
MCHC: 33.8 g/dL (ref 30.0–36.0)
MCV: 89.8 fL (ref 78.0–100.0)
Platelets: 372 10*3/uL (ref 150–400)
RBC: 4.42 MIL/uL (ref 3.87–5.11)
RDW: 15.9 % — AB (ref 11.5–15.5)
WBC: 18.4 10*3/uL — ABNORMAL HIGH (ref 4.0–10.5)

## 2016-11-07 LAB — CBC WITH DIFFERENTIAL/PLATELET
BASOS ABS: 0 10*3/uL (ref 0.0–0.1)
BASOS PCT: 0 %
Eosinophils Absolute: 0 10*3/uL (ref 0.0–0.7)
Eosinophils Relative: 0 %
HEMATOCRIT: 40.5 % (ref 36.0–46.0)
HEMOGLOBIN: 13.3 g/dL (ref 12.0–15.0)
LYMPHS PCT: 5 %
Lymphs Abs: 1.2 10*3/uL (ref 0.7–4.0)
MCH: 30.2 pg (ref 26.0–34.0)
MCHC: 32.8 g/dL (ref 30.0–36.0)
MCV: 91.8 fL (ref 78.0–100.0)
MONO ABS: 1.6 10*3/uL — AB (ref 0.1–1.0)
Monocytes Relative: 7 %
NEUTROS ABS: 18.8 10*3/uL — AB (ref 1.7–7.7)
NEUTROS PCT: 88 %
Platelets: 338 10*3/uL (ref 150–400)
RBC: 4.41 MIL/uL (ref 3.87–5.11)
RDW: 16.2 % — AB (ref 11.5–15.5)
WBC: 21.6 10*3/uL — ABNORMAL HIGH (ref 4.0–10.5)

## 2016-11-07 LAB — I-STAT CG4 LACTIC ACID, ED: LACTIC ACID, VENOUS: 0.99 mmol/L (ref 0.5–1.9)

## 2016-11-07 LAB — URINE CULTURE

## 2016-11-07 LAB — I-STAT TROPONIN, ED: Troponin i, poc: 0.02 ng/mL (ref 0.00–0.08)

## 2016-11-07 LAB — TROPONIN I: TROPONIN I: 0.14 ng/mL — AB (ref <0.03)

## 2016-11-07 LAB — BRAIN NATRIURETIC PEPTIDE: B NATRIURETIC PEPTIDE 5: 198.8 pg/mL — AB (ref 0.0–100.0)

## 2016-11-07 LAB — FERRITIN: FERRITIN: 117 ng/mL (ref 11–307)

## 2016-11-07 MED ORDER — LEVOTHYROXINE SODIUM 112 MCG PO TABS
112.0000 ug | ORAL_TABLET | Freq: Every day | ORAL | Status: DC
  Administered 2016-11-08 – 2016-11-10 (×3): 112 ug via ORAL
  Filled 2016-11-07 (×3): qty 1

## 2016-11-07 MED ORDER — HYDRALAZINE HCL 20 MG/ML IJ SOLN: 10.0000 mg | Freq: Two times a day (BID) | INTRAMUSCULAR | Status: DC | PRN

## 2016-11-07 MED ORDER — SODIUM CHLORIDE 0.9 % IV SOLN
INTRAVENOUS | Status: AC
  Administered 2016-11-07 – 2016-11-08 (×2): via INTRAVENOUS
  Administered 2016-11-08: 500 mL via INTRAVENOUS

## 2016-11-07 MED ORDER — PANTOPRAZOLE SODIUM 40 MG PO TBEC
40.0000 mg | DELAYED_RELEASE_TABLET | Freq: Every day | ORAL | Status: DC
  Administered 2016-11-07 – 2016-11-10 (×4): 40 mg via ORAL
  Filled 2016-11-07 (×4): qty 1

## 2016-11-07 MED ORDER — ALBUTEROL SULFATE (2.5 MG/3ML) 0.083% IN NEBU: 2.5000 mg | INHALATION_SOLUTION | RESPIRATORY_TRACT | Status: DC | PRN

## 2016-11-07 MED ORDER — SODIUM CHLORIDE 0.9 % IV BOLUS (SEPSIS)
500.0000 mL | Freq: Once | INTRAVENOUS | Status: AC
  Administered 2016-11-07: 500 mL via INTRAVENOUS

## 2016-11-07 MED ORDER — METOPROLOL TARTRATE 25 MG PO TABS
25.0000 mg | ORAL_TABLET | Freq: Every day | ORAL | Status: DC
  Administered 2016-11-07 – 2016-11-10 (×4): 25 mg via ORAL
  Filled 2016-11-07 (×4): qty 1

## 2016-11-07 MED ORDER — HYDRALAZINE HCL 10 MG PO TABS
10.0000 mg | ORAL_TABLET | Freq: Three times a day (TID) | ORAL | Status: DC
  Administered 2016-11-07 – 2016-11-10 (×8): 10 mg via ORAL
  Filled 2016-11-07 (×8): qty 1

## 2016-11-07 MED ORDER — IPRATROPIUM-ALBUTEROL 0.5-2.5 (3) MG/3ML IN SOLN
3.0000 mL | Freq: Four times a day (QID) | RESPIRATORY_TRACT | Status: DC
  Administered 2016-11-07 – 2016-11-08 (×3): 3 mL via RESPIRATORY_TRACT
  Filled 2016-11-07 (×3): qty 3

## 2016-11-07 MED ORDER — AZITHROMYCIN 500 MG IV SOLR
500.0000 mg | Freq: Once | INTRAVENOUS | Status: AC
  Administered 2016-11-07: 500 mg via INTRAVENOUS
  Filled 2016-11-07: qty 500

## 2016-11-07 MED ORDER — DEXTROSE 5 % IV SOLN
1.0000 g | INTRAVENOUS | Status: DC
  Administered 2016-11-07 – 2016-11-10 (×4): 1 g via INTRAVENOUS
  Filled 2016-11-07 (×4): qty 10

## 2016-11-07 MED ORDER — ENOXAPARIN SODIUM 40 MG/0.4ML ~~LOC~~ SOLN
40.0000 mg | SUBCUTANEOUS | Status: DC
Start: 2016-11-07 — End: 2016-11-10
  Administered 2016-11-07 – 2016-11-09 (×3): 40 mg via SUBCUTANEOUS
  Filled 2016-11-07 (×3): qty 0.4

## 2016-11-07 MED ORDER — DIGOXIN 125 MCG PO TABS
0.1250 mg | ORAL_TABLET | Freq: Every day | ORAL | Status: DC
  Administered 2016-11-07 – 2016-11-10 (×4): 0.125 mg via ORAL
  Filled 2016-11-07 (×4): qty 1

## 2016-11-07 MED ORDER — ENSURE ENLIVE PO LIQD
237.0000 mL | Freq: Two times a day (BID) | ORAL | Status: DC
  Administered 2016-11-08 – 2016-11-09 (×2): 237 mL via ORAL

## 2016-11-07 NOTE — ED Provider Notes (Signed)
[place]    CSN: [id number] Arrival date & time: 11/07/16  1019      History   Chief Complaint Chief Complaint  Patient presents with  . Loss of Consciousness    HPI KATEN JIMISON is a 82 y.o. female.   HPI  She is in a 64-year-old woman here with her daughter for evaluation of syncope and chest pain. Daughter provides most of the history. She has been sick with nausea and diarrhea for about a week. Yesterday, she had an episode where her vision got blurry and she felt nauseous. The next thing she remembers is waking up on the floor. No postictal state described. This was observed by family members. They report a syncopal event. She also had a fever yesterday of 100.7. This responded to Tylenol. EMS was called after the syncopal event, but she refused transport to the ER at that time. She has had pain in the left lower back for the last day or so. This is worse with deep breaths and coughing. She denies any significant cough. She does report feeling a little short of breath. She had an episode while in the waiting room where she felt like she was going to faint again. She also has a history of GAVE syndrome. She is monitored with monthly ferritin. The end of September she required a transfusion which bumped her ferritin to over 200. Her most recent check was November 28 and the ferritin was 47. She is due to for another check tomorrow.  Past Medical History:  Diagnosis Date  . Anemia   . GAVE (gastric antral vascular ectasia) 09/12/12  . Hypertension   . Hypothyroidism   . Iron deficiency anemia 10/15/2012  . Iron deficiency anemia due to chronic blood loss 04/20/2014  . On home O2    wears 2L/[place] at night  . Palpitations     Patient Active Problem List   Diagnosis Date Noted  . Family history of aortic aneurysm 09/07/2014  . Iron deficiency anemia due to chronic blood loss 04/20/2014  . Constipation 07/23/2013  . Acute posthemorrhagic anemia 07/23/2013  . GAVE  (gastric antral vascular ectasia) 07/23/2013  . Chronic respiratory failure with hypoxia ([place]) 10/17/2012  . Pulmonary nodule 09/26/2012  . Pulmonary hypertension ([place]) 09/18/2012  . Pruritus 06/20/2012  . Benign hypertensive heart disease without heart failure 06/23/2011  . Palpitations 06/23/2011  . Hypothyroidism 06/23/2011  . GERD (gastroesophageal reflux disease) 06/23/2011    Past Surgical History:  Procedure Laterality Date  . ABDOMINAL HYSTERECTOMY    . back surger x 2    . COLONOSCOPY  07/24/2012   Procedure: COLONOSCOPY;  Surgeon: Rogene Houston, MD;  [facility];  Service: Endoscopy;  Laterality: N/A;  200  . ESOPHAGOGASTRODUODENOSCOPY  09/12/2012   Procedure: ESOPHAGOGASTRODUODENOSCOPY (EGD);  Surgeon: Rogene Houston, MD;  [facility];  Service: Endoscopy;  Laterality: N/A;  325-changed to 200 per Ann-pt moved up to [place] to notify pt  . ESOPHAGOGASTRODUODENOSCOPY  11/01/2012   Procedure: ESOPHAGOGASTRODUODENOSCOPY (EGD);  Surgeon: Rogene Houston, MD;  [facility];  Service: Endoscopy;  Laterality: N/A;  1:20  . ESOPHAGOGASTRODUODENOSCOPY N/A 07/04/2013   Procedure: ESOPHAGOGASTRODUODENOSCOPY (EGD);  Surgeon: Rogene Houston, MD;  [facility];  Service: Endoscopy;  Laterality: N/A;  850  . ESOPHAGOGASTRODUODENOSCOPY N/A 08/06/2013   Procedure: ESOPHAGOGASTRODUODENOSCOPY (EGD);  Surgeon: Rogene Houston, MD;  [facility];  Service: Endoscopy;  Laterality: N/A;  1200  .  ESOPHAGOGASTRODUODENOSCOPY N/A 10/31/2013   Procedure: ESOPHAGOGASTRODUODENOSCOPY (EGD);  Surgeon: Rogene Houston, MD;  [facility];  Service: Endoscopy;  Laterality: N/A;  125-moved to [place] notified pt  . ESOPHAGOGASTRODUODENOSCOPY N/A 10/23/2014   Procedure: ESOPHAGOGASTRODUODENOSCOPY (EGD);  Surgeon: Rogene Houston, MD;  [facility];  Service: Endoscopy;  Laterality: N/A;  1020  . ESOPHAGOGASTRODUODENOSCOPY N/A  10/11/2016   Procedure: ESOPHAGOGASTRODUODENOSCOPY (EGD);  Surgeon: Rogene Houston, MD;  [facility];  Service: Endoscopy;  Laterality: N/A;  245  . HEEL SPUR SURGERY    . HOT HEMOSTASIS  11/01/2012   Procedure: HOT HEMOSTASIS (ARGON PLASMA COAGULATION/BICAP);  Surgeon: Rogene Houston, MD;  [facility];  Service: Endoscopy;  Laterality: N/A;  . HOT HEMOSTASIS N/A 07/04/2013   Procedure: HOT HEMOSTASIS (ARGON PLASMA COAGULATION/BICAP);  Surgeon: Rogene Houston, MD;  [facility];  Service: Endoscopy;  Laterality: N/A;  . HOT HEMOSTASIS N/A 08/06/2013   Procedure: HOT HEMOSTASIS (ARGON PLASMA COAGULATION/BICAP);  Surgeon: Rogene Houston, MD;  [facility];  Service: Endoscopy;  Laterality: N/A;  . HOT HEMOSTASIS N/A 10/31/2013   Procedure: HOT HEMOSTASIS (ARGON PLASMA COAGULATION/BICAP);  Surgeon: Rogene Houston, MD;  [facility];  Service: Endoscopy;  Laterality: N/A;  . HOT HEMOSTASIS N/A 10/23/2014   Procedure: HOT HEMOSTASIS (ARGON PLASMA COAGULATION/BICAP);  Surgeon: Rogene Houston, MD;  [facility];  Service: Endoscopy;  Laterality: N/A;  . HOT HEMOSTASIS N/A 10/11/2016   Procedure: HOT HEMOSTASIS (ARGON PLASMA COAGULATION/BICAP);  Surgeon: Rogene Houston, MD;  [facility];  Service: Endoscopy;  Laterality: N/A;  . knee replacement rt 3 yrs ago    . LUMBAR FUSION  06/25/14   L4 & L5  . SHOULDER SURGERY    . TONSILLECTOMY AND ADENOIDECTOMY    . TOTAL ABDOMINAL HYSTERECTOMY    . US ECHOCARDIOGRAPHY  03/12/2006   EF 55-60%    OB History    No data available       Home Medications    Prior to Admission medications   Medication Sig Start Date End Date Taking? Authorizing Provider  albuterol (PROVENTIL) (2.5 MG/3ML) 0.083% nebulizer solution Take 2.5 mg by nebulization every 6 (six) hours as needed for wheezing or shortness of breath.    Historical Provider, MD  amLODipine (NORVASC) 2.5 MG tablet  Take 1 tablet (2.5 mg total) by mouth daily. 08/22/16   Peter M Martinique, MD  Cholecalciferol (VITAMIN D-3) 1000 UNITS CAPS Take 2,000 Units by mouth daily.     Historical Provider, MD  digoxin (DIGOX) 0.125 MG tablet Take 1 tablet mouth daily except none on Sundays or as directed 10/22/15   Darlin Coco, MD  hydrALAZINE (APRESOLINE) 10 MG tablet Take 1 tablet (10 mg total) by mouth 3 (three) times daily. 05/23/16   Peter M Martinique, MD  levothyroxine (SYNTHROID, LEVOTHROID) 112 MCG tablet Take 112 mcg by mouth daily before breakfast.    Historical Provider, MD  metoprolol tartrate (LOPRESSOR) 25 MG tablet Take 1 tablet (25 mg total) by mouth daily. 09/13/16   Peter M Martinique, MD  pantoprazole (PROTONIX) 40 MG tablet Take 1 tablet (40 mg total) by mouth daily. 10/27/16   Peter M Martinique, MD  polyethylene glycol powder (GLYCOLAX/MIRALAX) powder Take 17 g by mouth daily as needed for moderate constipation.  07/23/13   Darlin Coco, MD    Family History Family History  Problem Relation Age of Onset  .  Hypertension Mother   . Hypertension Father   . Heart attack Father   . Other Sister     aneurysm on heart  . Other Brother     aneurysm on heart    Social History Social History  Substance Use Topics  . Smoking status: Never Smoker  . Smokeless tobacco: Never Used  . Alcohol use No     Allergies   Avapro [irbesartan]; Clarithromycin; Losartan potassium-hctz; Maxzide [hydrochlorothiazide w-triamterene]; Ziac [bisoprolol-hydrochlorothiazide]; Penicillins; and Sulfa drugs cross reactors   Review of Systems Review of Systems As in history of present illness  Physical Exam Triage Vital Signs ED Triage Vitals  Enc Vitals Group     BP      Pulse      Resp      Temp      Temp src      SpO2      Weight      Height      Head Circumference      Peak Flow      Pain Score      Pain Loc      Pain Edu?      Excl. in [place]?    No data found.   Updated Vital Signs BP 131/62 (BP  Location: Left Arm)   Pulse 101   Temp 98.4 F (36.9 C) (Oral)   Resp 16   SpO2 91% Comment: pt. on home 02,notified cma  Visual Acuity Right Eye Distance:   Left Eye Distance:   Bilateral Distance:    Right Eye Near:   Left Eye Near:    Bilateral Near:     Physical Exam  Constitutional: She is oriented to person, place, and time. She appears well-developed and well-nourished. No distress.  Neck: Neck supple.  Cardiovascular: Normal rate, regular rhythm and normal heart sounds.   No murmur heard. Pulmonary/Chest: Effort normal. She has no wheezes. She has no rales. She exhibits no tenderness.  Appear short of breath. Decreased breath sounds in the left lung base. This is the area of her pain.  Lymphadenopathy:    She has no cervical adenopathy.  Neurological: She is alert and oriented to person, place, and time.     UC Treatments / Results  Labs (all labs ordered are listed, but only abnormal results are displayed) Labs Reviewed - No data to display  EKG  EKG Interpretation None       Radiology No results found.  Procedures Procedures (including critical care time)  Medications Ordered in UC Medications - No data to display   Initial Impression / Assessment and Plan / UC Course  I have reviewed the triage vital signs and the nursing notes.  Pertinent labs & imaging results that were available during my care of the patient were reviewed by me and considered in my medical decision making (see chart for details).  Clinical Course     Discussed with patient and her daughter that she should be evaluated in the emergency room given her age and medical history. Given the fever, shortness of breath, and chest pain differential includes cardiac etiology, pneumonia, and PE. We will transport to [facility] ED via EMS. IV placed and placed on cardiac monitor.  Final Clinical Impressions(s) / UC Diagnoses   Final diagnoses:  Syncope, unspecified syncope type    Chest pain, unspecified type  Fever, unspecified fever cause    New Prescriptions New Prescriptions   No medications on file     Erin  Earlene Plater, MD 11/07/16 [id number]

## 2016-11-07 NOTE — ED Provider Notes (Signed)
[facility] DEPT Provider Note   CSN: [id number] Arrival date & time: 11/07/16  1232     History   Chief Complaint Chief Complaint  Patient presents with  . Loss of Consciousness    HPI [name] is a 82 y.o. female.  The history is provided by the patient and medical records.  Loss of Consciousness      82 year old female with history of anemia, gave syndrome, hypertension, hypothyroidism, iron deficiency anemia, presenting to the ED for syncope. Patient's daughter is here with her who provides supplemental history. Patient reports she has been sick with a GI bug for about a week now. She has had multiple episodes of nausea, vomiting, and nonbloody diarrhea.  States yesterday she got up and went to go eat but wasn't feeling very well so was trying to go back to bed. Her daughter reports she saw her standing in the kitchen and noticed that she will "glazed over" and caught her before she hit the ground. States she was able to ease her to the floor so there was no significant head trauma. Daughter reports she was unconscious for about 10 seconds or so but came back to quickly and was fully oriented.  EMS was called and responded to the home yesterday, however patient declined transport at that time. Patient reports she continues not feeling well today. She does report some pain in her ribs with coughing. She has noticed some phlegm. Has been running a fever at home but has been controlled with Tylenol. Patient does use home oxygen, 2 L at night usually. Daughter reports she made her wear her oxygen for most of the day yesterday due to her symptoms.  Past Medical History:  Diagnosis Date  . Anemia   . GAVE (gastric antral vascular ectasia) 09/12/12  . Hypertension   . Hypothyroidism   . Iron deficiency anemia 10/15/2012  . Iron deficiency anemia due to chronic blood loss 04/20/2014  . On home O2    wears 2L/[place] at night  . Palpitations     Patient Active Problem List   Diagnosis Date Noted  . Family history of aortic aneurysm 09/07/2014  . Iron deficiency anemia due to chronic blood loss 04/20/2014  . Constipation 07/23/2013  . Acute posthemorrhagic anemia 07/23/2013  . GAVE (gastric antral vascular ectasia) 07/23/2013  . Chronic respiratory failure with hypoxia ([place]) 10/17/2012  . Pulmonary nodule 09/26/2012  . Pulmonary hypertension ([place]) 09/18/2012  . Pruritus 06/20/2012  . Benign hypertensive heart disease without heart failure 06/23/2011  . Palpitations 06/23/2011  . Hypothyroidism 06/23/2011  . GERD (gastroesophageal reflux disease) 06/23/2011    Past Surgical History:  Procedure Laterality Date  . ABDOMINAL HYSTERECTOMY    . back surger x 2    . COLONOSCOPY  07/24/2012   Procedure: COLONOSCOPY;  Surgeon: Rogene Houston, MD;  [facility];  Service: Endoscopy;  Laterality: N/A;  200  . ESOPHAGOGASTRODUODENOSCOPY  09/12/2012   Procedure: ESOPHAGOGASTRODUODENOSCOPY (EGD);  Surgeon: Rogene Houston, MD;  [facility];  Service: Endoscopy;  Laterality: N/A;  325-changed to 200 per Ann-pt moved up to [place] to notify pt  . ESOPHAGOGASTRODUODENOSCOPY  11/01/2012   Procedure: ESOPHAGOGASTRODUODENOSCOPY (EGD);  Surgeon: Rogene Houston, MD;  [facility];  Service: Endoscopy;  Laterality: N/A;  1:20  . ESOPHAGOGASTRODUODENOSCOPY N/A 07/04/2013   Procedure: ESOPHAGOGASTRODUODENOSCOPY (EGD);  Surgeon: Rogene Houston, MD;  [facility];  Service: Endoscopy;  Laterality: N/A;  850  .  ESOPHAGOGASTRODUODENOSCOPY N/A 08/06/2013   Procedure: ESOPHAGOGASTRODUODENOSCOPY (EGD);  Surgeon: Rogene Houston, MD;  [facility];  Service: Endoscopy;  Laterality: N/A;  1200  . ESOPHAGOGASTRODUODENOSCOPY N/A 10/31/2013   Procedure: ESOPHAGOGASTRODUODENOSCOPY (EGD);  Surgeon: Rogene Houston, MD;  [facility];  Service: Endoscopy;  Laterality: N/A;  125-moved to [place] notified pt  .  ESOPHAGOGASTRODUODENOSCOPY N/A 10/23/2014   Procedure: ESOPHAGOGASTRODUODENOSCOPY (EGD);  Surgeon: Rogene Houston, MD;  [facility];  Service: Endoscopy;  Laterality: N/A;  1020  . ESOPHAGOGASTRODUODENOSCOPY N/A 10/11/2016   Procedure: ESOPHAGOGASTRODUODENOSCOPY (EGD);  Surgeon: Rogene Houston, MD;  [facility];  Service: Endoscopy;  Laterality: N/A;  245  . HEEL SPUR SURGERY    . HOT HEMOSTASIS  11/01/2012   Procedure: HOT HEMOSTASIS (ARGON PLASMA COAGULATION/BICAP);  Surgeon: Rogene Houston, MD;  [facility];  Service: Endoscopy;  Laterality: N/A;  . HOT HEMOSTASIS N/A 07/04/2013   Procedure: HOT HEMOSTASIS (ARGON PLASMA COAGULATION/BICAP);  Surgeon: Rogene Houston, MD;  [facility];  Service: Endoscopy;  Laterality: N/A;  . HOT HEMOSTASIS N/A 08/06/2013   Procedure: HOT HEMOSTASIS (ARGON PLASMA COAGULATION/BICAP);  Surgeon: Rogene Houston, MD;  [facility];  Service: Endoscopy;  Laterality: N/A;  . HOT HEMOSTASIS N/A 10/31/2013   Procedure: HOT HEMOSTASIS (ARGON PLASMA COAGULATION/BICAP);  Surgeon: Rogene Houston, MD;  [facility];  Service: Endoscopy;  Laterality: N/A;  . HOT HEMOSTASIS N/A 10/23/2014   Procedure: HOT HEMOSTASIS (ARGON PLASMA COAGULATION/BICAP);  Surgeon: Rogene Houston, MD;  [facility];  Service: Endoscopy;  Laterality: N/A;  . HOT HEMOSTASIS N/A 10/11/2016   Procedure: HOT HEMOSTASIS (ARGON PLASMA COAGULATION/BICAP);  Surgeon: Rogene Houston, MD;  [facility];  Service: Endoscopy;  Laterality: N/A;  . knee replacement rt 3 yrs ago    . LUMBAR FUSION  06/25/14   L4 & L5  . SHOULDER SURGERY    . TONSILLECTOMY AND ADENOIDECTOMY    . TOTAL ABDOMINAL HYSTERECTOMY    . US ECHOCARDIOGRAPHY  03/12/2006   EF 55-60%    OB History    No data available       Home Medications    Prior to Admission medications   Medication Sig Start Date End Date Taking? Authorizing  Provider  albuterol (PROVENTIL) (2.5 MG/3ML) 0.083% nebulizer solution Take 2.5 mg by nebulization every 6 (six) hours as needed for wheezing or shortness of breath.    Historical Provider, MD  amLODipine (NORVASC) 2.5 MG tablet Take 1 tablet (2.5 mg total) by mouth daily. 08/22/16   Peter M Martinique, MD  Cholecalciferol (VITAMIN D-3) 1000 UNITS CAPS Take 2,000 Units by mouth daily.     Historical Provider, MD  digoxin (DIGOX) 0.125 MG tablet Take 1 tablet mouth daily except none on Sundays or as directed 10/22/15   Darlin Coco, MD  hydrALAZINE (APRESOLINE) 10 MG tablet Take 1 tablet (10 mg total) by mouth 3 (three) times daily. 05/23/16   Peter M Martinique, MD  levothyroxine (SYNTHROID, LEVOTHROID) 112 MCG tablet Take 112 mcg by mouth daily before breakfast.    Historical Provider, MD  metoprolol tartrate (LOPRESSOR) 25 MG tablet Take 1 tablet (25 mg total) by mouth daily. 09/13/16   Peter M Martinique, MD  pantoprazole (PROTONIX) 40 MG tablet Take 1 tablet (40 mg total) by mouth daily. 10/27/16   Peter M Martinique, MD  polyethylene glycol powder (GLYCOLAX/MIRALAX) powder Take 17 g by  mouth daily as needed for moderate constipation.  07/23/13   Darlin Coco, MD    Family History Family History  Problem Relation Age of Onset  . Hypertension Mother   . Hypertension Father   . Heart attack Father   . Other Sister     aneurysm on heart  . Other Brother     aneurysm on heart    Social History Social History  Substance Use Topics  . Smoking status: Never Smoker  . Smokeless tobacco: Never Used  . Alcohol use No     Allergies   Avapro [irbesartan]; Clarithromycin; Losartan potassium-hctz; Maxzide [hydrochlorothiazide w-triamterene]; Ziac [bisoprolol-hydrochlorothiazide]; Penicillins; and Sulfa drugs cross reactors   Review of Systems Review of Systems  Cardiovascular: Positive for syncope.  All other systems reviewed and are negative.    Physical Exam Updated Vital Signs BP 137/62  (BP Location: Right Arm)   Pulse 90   Temp 98.3 F (36.8 C) (Oral)   Resp 16   SpO2 94%   Physical Exam  Constitutional: She is oriented to person, place, and time. She appears well-developed and well-nourished.  HENT:  Head: Normocephalic and atraumatic.  Mouth/Throat: Oropharynx is clear and moist.  Eyes: Conjunctivae and EOM are normal. Pupils are equal, round, and reactive to light.  Neck: Normal range of motion.  Cardiovascular: Normal rate, regular rhythm and normal heart sounds.   Pulmonary/Chest: Effort normal and breath sounds normal. No respiratory distress.  Coarse breath sounds noted, some tenderness of the left lower posterior ribs without gross deformity  Abdominal: Soft. Bowel sounds are normal. There is no tenderness. There is no rebound.  Musculoskeletal: Normal range of motion.  Neurological: She is alert and oriented to person, place, and time.  Skin: Skin is warm and dry.  Psychiatric: She has a normal mood and affect.  Nursing note and vitals reviewed.    ED Treatments / Results  Labs (all labs ordered are listed, but only abnormal results are displayed) Labs Reviewed  CBC WITH DIFFERENTIAL/PLATELET - Abnormal; Notable for the following:       Result Value   WBC 21.6 (*)    RDW 16.2 (*)    Neutro Abs 18.8 (*)    Monocytes Absolute 1.6 (*)    All other components within normal limits  BASIC METABOLIC PANEL - Abnormal; Notable for the following:    Sodium 134 (*)    Chloride 97 (*)    Glucose, Bld 100 (*)    Creatinine, Ser 1.01 (*)    Calcium 8.8 (*)    GFR calc non Af Amer 51 (*)    GFR calc Af Amer 59 (*)    All other components within normal limits  BRAIN NATRIURETIC PEPTIDE - Abnormal; Notable for the following:    B Natriuretic Peptide 198.8 (*)    All other components within normal limits  URINE CULTURE  URINE CULTURE  FERRITIN  URINALYSIS, ROUTINE W REFLEX MICROSCOPIC  I-STAT TROPOININ, ED  I-STAT CG4 LACTIC ACID, ED  I-STAT CG4 LACTIC  ACID, ED    EKG  EKG Interpretation None       Radiology Dg Chest 2 View  Result Date: 11/07/2016 CLINICAL DATA:  Cough and shortness of breath for 2 days. EXAM: CHEST  2 VIEW COMPARISON:  CT chest 09/11/2014. PA and lateral chest 08/14/2016 and 07/12/2016. FINDINGS: The lungs are emphysematous. There is new patchy airspace disease in the upper lobes bilaterally. No pneumothorax or pleural effusion. Heart size is upper normal. Aortic atherosclerosis  is noted. IMPRESSION: Patchy bilateral upper lobe airspace disease worrisome for pneumonia. Recommend followup to clearing. Emphysema. Atherosclerosis. Electronically Signed   By: Inge Rise M.D.   On: 11/07/2016 13:41    Procedures Procedures (including critical care time)  Medications Ordered in ED Medications - No data to display   Initial Impression / Assessment and Plan / ED Course  I have reviewed the triage vital signs and the nursing notes.  Pertinent labs & imaging results that were available during my care of the patient were reviewed by me and considered in my medical decision making (see chart for details).  Clinical Course    82 year old female here with syncope. This was witnessed by her daughter yesterday. There was no head injury or loss of consciousness as daughter lowered her to the ground. EMS was called, declined transport yesterday. She has been feeling lightheaded again today with near syncope at urgent care office, thus was sent to the ED for further evaluation. Patient is awake, alert, fully oriented here. She is answering questions and following commands appropriately. She does have coarse breath sounds on exam. She is currently requiring 2 L supplemental oxygen, usually only has to use this at night. Workup with leukocytosis of 21K.  CAP seen on CXR.  Started on rocephin and azithromycin.  Patient will be admitted for syncope and CAP.  Discussed with Dr. Aggie Moats of [facility]-- will admit to  med-surg for ongoing care.  Temp admit orders placed.  Final Clinical Impressions(s) / ED Diagnoses   Final diagnoses:  Syncope, unspecified syncope type  Community acquired pneumonia, unspecified laterality    New Prescriptions New Prescriptions   No medications on file     Kathryne Hitch 11/07/16 1632    Virgel Manifold, MD 11/08/16 [phone]

## 2016-11-07 NOTE — ED Triage Notes (Signed)
Pt to ER by [facility] from [facility] to be further evaluated for syncopal episode yesterday. Episode was witnessed by daughter who states patient was unresponsive for approximately 10 seconds then came to. VSS. Pt is a/o x4. Swelling noted to bilateral lower extremities.

## 2016-11-07 NOTE — Discharge Instructions (Signed)
Concern for PNA vs PE vs cardiac etiology of chest pain, fever, and syncopal event.  Will send via EMS.

## 2016-11-07 NOTE — ED Notes (Signed)
[name] stated no need for urine at this time

## 2016-11-07 NOTE — H&P (Signed)
[facility] History and Physical  TACOMA COUSER [id number] DOB: February 17, 1933 DOA: 11/07/2016  Referring physician:  PCP: Wende Neighbors, MD   Chief Complaint: "I didn't want to come here."  HPI: [name] is a 82 y.o. female  past mental history of anemia, hypertension, hypothyroidism sent to the emergency room with sequelae and collapse. Patient's states that for 5 days she had diarrhea. Multiple episodes large volume. No blood. After this the following day the patient felt weak and collapsed in front of daughter who caught her and laid her down slowly on the floor. Patient was unconscious for 10 seconds. She then was normal after that and refused to go to the hospital. Daughter's plan was to take patient to see her primary care doctor the following day. The morning of that appointment the patient passed out again so her dgtr brought her to the emergency room. Patient has had a nonproductive cough. Denies fevers and chills. No nausea vomiting.  Patient has no history of arrhythmia or heart attack.   Review of Systems:  As per HPI otherwise 10 point review of systems negative.    Past Medical History:  Diagnosis Date  . Anemia   . GAVE (gastric antral vascular ectasia) 09/12/12  . Hypertension   . Hypothyroidism   . Iron deficiency anemia 10/15/2012  . Iron deficiency anemia due to chronic blood loss 04/20/2014  . On home O2    wears 2L/[place] at night  . Palpitations    Past Surgical History:  Procedure Laterality Date  . ABDOMINAL HYSTERECTOMY    . back surger x 2    . COLONOSCOPY  07/24/2012   Procedure: COLONOSCOPY;  Surgeon: Rogene Houston, MD;  [facility];  Service: Endoscopy;  Laterality: N/A;  200  . ESOPHAGOGASTRODUODENOSCOPY  09/12/2012   Procedure: ESOPHAGOGASTRODUODENOSCOPY (EGD);  Surgeon: Rogene Houston, MD;  [facility];  Service: Endoscopy;  Laterality: N/A;  325-changed to 200 per Ann-pt moved up to [place] to notify pt  .  ESOPHAGOGASTRODUODENOSCOPY  11/01/2012   Procedure: ESOPHAGOGASTRODUODENOSCOPY (EGD);  Surgeon: Rogene Houston, MD;  [facility];  Service: Endoscopy;  Laterality: N/A;  1:20  . ESOPHAGOGASTRODUODENOSCOPY N/A 07/04/2013   Procedure: ESOPHAGOGASTRODUODENOSCOPY (EGD);  Surgeon: Rogene Houston, MD;  [facility];  Service: Endoscopy;  Laterality: N/A;  850  . ESOPHAGOGASTRODUODENOSCOPY N/A 08/06/2013   Procedure: ESOPHAGOGASTRODUODENOSCOPY (EGD);  Surgeon: Rogene Houston, MD;  [facility];  Service: Endoscopy;  Laterality: N/A;  1200  . ESOPHAGOGASTRODUODENOSCOPY N/A 10/31/2013   Procedure: ESOPHAGOGASTRODUODENOSCOPY (EGD);  Surgeon: Rogene Houston, MD;  [facility];  Service: Endoscopy;  Laterality: N/A;  125-moved to [place] notified pt  . ESOPHAGOGASTRODUODENOSCOPY N/A 10/23/2014   Procedure: ESOPHAGOGASTRODUODENOSCOPY (EGD);  Surgeon: Rogene Houston, MD;  [facility];  Service: Endoscopy;  Laterality: N/A;  1020  . ESOPHAGOGASTRODUODENOSCOPY N/A 10/11/2016   Procedure: ESOPHAGOGASTRODUODENOSCOPY (EGD);  Surgeon: Rogene Houston, MD;  [facility];  Service: Endoscopy;  Laterality: N/A;  245  . HEEL SPUR SURGERY    . HOT HEMOSTASIS  11/01/2012   Procedure: HOT HEMOSTASIS (ARGON PLASMA COAGULATION/BICAP);  Surgeon: Rogene Houston, MD;  [facility];  Service: Endoscopy;  Laterality: N/A;  . HOT HEMOSTASIS N/A 07/04/2013   Procedure: HOT HEMOSTASIS (ARGON PLASMA COAGULATION/BICAP);  Surgeon: Rogene Houston, MD;  [facility];  Service: Endoscopy;  Laterality: N/A;  . HOT HEMOSTASIS N/A  08/06/2013   Procedure: HOT HEMOSTASIS (ARGON PLASMA COAGULATION/BICAP);  Surgeon: Rogene Houston, MD;  [facility];  Service: Endoscopy;  Laterality: N/A;  . HOT HEMOSTASIS N/A 10/31/2013   Procedure: HOT HEMOSTASIS (ARGON PLASMA COAGULATION/BICAP);  Surgeon: Rogene Houston, MD;  [facility];   Service: Endoscopy;  Laterality: N/A;  . HOT HEMOSTASIS N/A 10/23/2014   Procedure: HOT HEMOSTASIS (ARGON PLASMA COAGULATION/BICAP);  Surgeon: Rogene Houston, MD;  [facility];  Service: Endoscopy;  Laterality: N/A;  . HOT HEMOSTASIS N/A 10/11/2016   Procedure: HOT HEMOSTASIS (ARGON PLASMA COAGULATION/BICAP);  Surgeon: Rogene Houston, MD;  [facility];  Service: Endoscopy;  Laterality: N/A;  . knee replacement rt 3 yrs ago    . LUMBAR FUSION  06/25/14   L4 & L5  . SHOULDER SURGERY    . TONSILLECTOMY AND ADENOIDECTOMY    . TOTAL ABDOMINAL HYSTERECTOMY    . US ECHOCARDIOGRAPHY  03/12/2006   EF 55-60%   Social History:  reports that she has never smoked. She has never used smokeless tobacco. She reports that she does not drink alcohol or use drugs.  Allergies  Allergen Reactions  . Avapro [Irbesartan] Other (See Comments)    Cough   . Clarithromycin Other (See Comments)    Unknown   . Losartan Potassium-Hctz Other (See Comments)    Unknown  . Maxzide [Hydrochlorothiazide W-Triamterene] Other (See Comments)    Unknown   . Ziac [Bisoprolol-Hydrochlorothiazide]     Thinks caused itching and cough 08/12/12  . Penicillins Swelling and Rash    Has patient had a PCN reaction causing immediate rash, facial/tongue/throat swelling, SOB or lightheadedness with hypotension: no Has patient had a PCN reaction causing severe rash involving mucus membranes or skin necrosis: no Has patient had a PCN reaction that required hospitalization {no Has patient had a PCN reaction occurring within the last 10 years: {no If all of the above answers are "NO", then may proceed with Cephalosporin use.  [facility] Cross Reactors Rash    Family History  Problem Relation Age of Onset  . Hypertension Mother   . Hypertension Father   . Heart attack Father   . Other Sister     aneurysm on heart  . Other Brother     aneurysm on heart     Prior to Admission medications   Medication  Sig Start Date End Date Taking? Authorizing Provider  albuterol (PROVENTIL) (2.5 MG/3ML) 0.083% nebulizer solution Take 2.5 mg by nebulization every 6 (six) hours as needed for wheezing or shortness of breath.   Yes Historical Provider, MD  amLODipine (NORVASC) 2.5 MG tablet Take 1 tablet (2.5 mg total) by mouth daily. 08/22/16  Yes Peter M Martinique, MD  Cholecalciferol (VITAMIN D-3) 1000 UNITS CAPS Take 2,000 Units by mouth daily.    Yes Historical Provider, MD  digoxin (DIGOX) 0.125 MG tablet Take 1 tablet mouth daily except none on Sundays or as directed 10/22/15  Yes Darlin Coco, MD  hydrALAZINE (APRESOLINE) 10 MG tablet Take 1 tablet (10 mg total) by mouth 3 (three) times daily. 05/23/16  Yes Peter M Martinique, MD  levothyroxine (SYNTHROID, LEVOTHROID) 112 MCG tablet Take 112 mcg by mouth daily before breakfast.   Yes Historical Provider, MD  metoprolol tartrate (LOPRESSOR) 25 MG tablet Take 1 tablet (25 mg total) by mouth daily. 09/13/16  Yes Peter M Martinique, MD  pantoprazole (PROTONIX) 40 MG tablet Take 1 tablet (40 mg total) by mouth  daily. 10/27/16  Yes Peter M Martinique, MD  polyethylene glycol powder [facility]) powder Take 17 g by mouth daily as needed for moderate constipation.  07/23/13  Yes Darlin Coco, MD   Physical Exam: Vitals:   11/07/16 1615 11/07/16 1630 11/07/16 1645 11/07/16 1733  BP: 141/67 131/73 142/67 (!) 149/69  Pulse:    94  Resp: 26   20  Temp:    98.1 F (36.7 C)  TempSrc:    Oral  SpO2: 96% 95% 95% 92%  Weight:    55.4 kg (122 lb 3.2 oz)  Height:    5\' 1"  (1.549 m)    Wt Readings from Last 3 Encounters:  11/07/16 55.4 kg (122 lb 3.2 oz)  10/24/16 56.8 kg (125 lb 3.2 oz)  10/11/16 59 kg (130 lb)    General:  Appears calm and comfortable Eyes:  PERRL, EOMI, normal lids, iris ENT:  grossly normal hearing, lips & tongue Neck:  no LAD, masses or thyromegaly Cardiovascular:  RRR, no m/r/g. No LE edema.  Respiratory:  CTA bilaterally, no w/r/r.  Incrrespiratory effort. Abdomen:  soft, ntnd Skin:  no rash or induration seen on limited exam Musculoskeletal:  grossly normal tone BUE/BLE Psychiatric:  grossly normal mood and affect, speech fluent and appropriate Neurologic:  CN 2-12 grossly intact, moves all extremities in coordinated fashion.          Labs on Admission:  Basic Metabolic Panel:  Recent Labs Lab 11/07/16 1323  NA 134*  K 4.4  CL 97*  CO2 26  GLUCOSE 100*  BUN 18  CREATININE 1.01*  CALCIUM 8.8*   Liver Function Tests: No results for input(s): AST, ALT, ALKPHOS, BILITOT, PROT, ALBUMIN in the last 168 hours. No results for input(s): LIPASE, AMYLASE in the last 168 hours. No results for input(s): AMMONIA in the last 168 hours. CBC:  Recent Labs Lab 11/07/16 1323  WBC 21.6*  NEUTROABS 18.8*  HGB 13.3  HCT 40.5  MCV 91.8  PLT 338   Cardiac Enzymes: No results for input(s): CKTOTAL, CKMB, CKMBINDEX, TROPONINI in the last 168 hours.  BNP (last 3 results)  Recent Labs  11/07/16 1323  BNP 198.8*    ProBNP (last 3 results) No results for input(s): PROBNP in the last 8760 hours.   Serum creatinine: 1.01 mg/dL High 11/07/16 1323 Estimated creatinine clearance: 33 mL/min  CBG: No results for input(s): GLUCAP in the last 168 hours.  Radiological Exams on Admission: Dg Chest 2 View  Result Date: 11/07/2016 CLINICAL DATA:  Cough and shortness of breath for 2 days. EXAM: CHEST  2 VIEW COMPARISON:  CT chest 09/11/2014. PA and lateral chest 08/14/2016 and 07/12/2016. FINDINGS: The lungs are emphysematous. There is new patchy airspace disease in the upper lobes bilaterally. No pneumothorax or pleural effusion. Heart size is upper normal. Aortic atherosclerosis is noted. IMPRESSION: Patchy bilateral upper lobe airspace disease worrisome for pneumonia. Recommend followup to clearing. Emphysema. Atherosclerosis. Electronically Signed   By: Inge Rise M.D.   On: 11/07/2016 13:41    EKG:  Independently reviewed. Short PR interval normal sinus rhythm. No STEMI. No acute ST changes when compared to 12/12 EKG  Assessment/Plan Principal Problem:   Syncope and collapse Active Problems:   Hypothyroidism   GERD (gastroesophageal reflux disease)   CAP (community acquired pneumonia)   HTN (hypertension)  Syncope and collapse Serial troponin Echo on the morning IV fluids overnight Likely due to dehydration due to illness prior to come to the hospital along with pneumonia  Scheduled DuoNeb's When necessary albuterol Antibiotic-Rocephin and 1 time dose of azithromycin (unknown allergy) Oxygen therapy Continuous pulse oximetry  GERD Cont PPI  Hypothyroidism Cont OP synthroid 112 mcg qd No signs of hyper or hypothyroidism  Hypertension When necessary hydralazine 10 mg IV as needed for severe blood pressure Continue Lopressor and hydralazine oral Hold Norvasc  Code Status: Full code DVT Prophylaxis: Lovenox Family Communication: Emily at bedside Disposition Plan: Pending Improvement  Status: Inpatient telemetry  Elwin Mocha, MD Family Medicine [facility] [email or web address] Password [email or web address]

## 2016-11-07 NOTE — ED Triage Notes (Signed)
The patient presented to the [facility] with a complaint of syncope that occurred yesterday. The patient's daughter reported that the patient had a fainting episode yesterday. EMS responded and the patient refused treatment. The patient stated that she has had abdominal pain with N/D the last week.

## 2016-11-07 NOTE — ED Notes (Signed)
[facility] advised no truck available. [facility] contacted and en route non-emergency.

## 2016-11-07 NOTE — Progress Notes (Signed)
NURSING PROGRESS NOTE  [name] [id number] Admission Data: 11/07/2016 5:20 PM Attending Provider: Elwin Mocha, MD [id number] Hall, MD Code Status: FULL  [name] is a 82 y.o. female patient admitted from ED:  -No acute distress noted.  -No complaints of shortness of breath.  -No complaints of chest pain.   Cardiac Monitoring: N/A  Blood pressure 142/67, pulse 83, temperature 98.3 F (36.8 C), temperature source Oral, resp. rate 26, SpO2 95 %.   IV Fluids:  IV in place, occlusive dsg intact without redness, IV cath antecubital left, condition patent and no redness normal saline.   Allergies:  Avapro [irbesartan]; Clarithromycin; Losartan potassium-hctz; Maxzide [hydrochlorothiazide w-triamterene]; Ziac [bisoprolol-hydrochlorothiazide]; Penicillins; and Sulfa drugs cross reactors  Past Medical History:   has a past medical history of Anemia; GAVE (gastric antral vascular ectasia) (09/12/12); Hypertension; Hypothyroidism; Iron deficiency anemia (10/15/2012); Iron deficiency anemia due to chronic blood loss (04/20/2014); On home O2; and Palpitations.  Past Surgical History:   has a past surgical history that includes Tonsillectomy and adenoidectomy; US ECHOCARDIOGRAPHY (03/12/2006); knee replacement rt 3 yrs ago; back surger x 2; Abdominal hysterectomy; Shoulder surgery; Colonoscopy (07/24/2012); Heel spur surgery; Esophagogastroduodenoscopy (09/12/2012); Total abdominal hysterectomy; Esophagogastroduodenoscopy (11/01/2012); Hot hemostasis (11/01/2012); Esophagogastroduodenoscopy (N/A, 07/04/2013); Hot hemostasis (N/A, 07/04/2013); Esophagogastroduodenoscopy (N/A, 08/06/2013); Hot hemostasis (N/A, 08/06/2013); Esophagogastroduodenoscopy (N/A, 10/31/2013); Hot hemostasis (N/A, 10/31/2013); Lumbar fusion (06/25/14); Esophagogastroduodenoscopy (N/A, 10/23/2014); Hot hemostasis (N/A, 10/23/2014); Esophagogastroduodenoscopy (N/A, 10/11/2016); and Hot hemostasis (N/A, 10/11/2016).  Social History:    reports that she has never smoked. She has never used smokeless tobacco. She reports that she does not drink alcohol or use drugs.  Skin: Intact. Swelling to bilateral lower extremities.   Patient/Family orientated to room. Information packet given to patient/family. Admission inpatient armband information verified with patient/family to include name and date of birth and placed on patient arm. Side rails up x 2, fall assessment and education completed with patient/family. Patient/family able to verbalize understanding of risk associated with falls and verbalized understanding to call for assistance before getting out of bed. Call light within reach. Patient/family able to voice and demonstrate understanding of unit orientation instructions.    Will continue to evaluate and treat per MD orders.  Charolette Child, RN

## 2016-11-08 ENCOUNTER — Observation Stay (HOSPITAL_BASED_OUTPATIENT_CLINIC_OR_DEPARTMENT_OTHER): Payer: Medicare Other

## 2016-11-08 ENCOUNTER — Other Ambulatory Visit (HOSPITAL_COMMUNITY): Payer: Medicare Other

## 2016-11-08 ENCOUNTER — Observation Stay (HOSPITAL_COMMUNITY): Payer: Medicare Other

## 2016-11-08 DIAGNOSIS — I6529 Occlusion and stenosis of unspecified carotid artery: Secondary | ICD-10-CM | POA: Diagnosis present

## 2016-11-08 DIAGNOSIS — Z882 Allergy status to sulfonamides status: Secondary | ICD-10-CM | POA: Diagnosis not present

## 2016-11-08 DIAGNOSIS — R748 Abnormal levels of other serum enzymes: Secondary | ICD-10-CM | POA: Diagnosis present

## 2016-11-08 DIAGNOSIS — R55 Syncope and collapse: Secondary | ICD-10-CM | POA: Diagnosis not present

## 2016-11-08 DIAGNOSIS — J9611 Chronic respiratory failure with hypoxia: Secondary | ICD-10-CM | POA: Diagnosis present

## 2016-11-08 DIAGNOSIS — Z90710 Acquired absence of both cervix and uterus: Secondary | ICD-10-CM | POA: Diagnosis not present

## 2016-11-08 DIAGNOSIS — K219 Gastro-esophageal reflux disease without esophagitis: Secondary | ICD-10-CM

## 2016-11-08 DIAGNOSIS — Z9889 Other specified postprocedural states: Secondary | ICD-10-CM | POA: Diagnosis not present

## 2016-11-08 DIAGNOSIS — R079 Chest pain, unspecified: Secondary | ICD-10-CM | POA: Diagnosis not present

## 2016-11-08 DIAGNOSIS — Z981 Arthrodesis status: Secondary | ICD-10-CM | POA: Diagnosis not present

## 2016-11-08 DIAGNOSIS — J189 Pneumonia, unspecified organism: Principal | ICD-10-CM

## 2016-11-08 DIAGNOSIS — D649 Anemia, unspecified: Secondary | ICD-10-CM | POA: Diagnosis present

## 2016-11-08 DIAGNOSIS — Z888 Allergy status to other drugs, medicaments and biological substances status: Secondary | ICD-10-CM | POA: Diagnosis not present

## 2016-11-08 DIAGNOSIS — R197 Diarrhea, unspecified: Secondary | ICD-10-CM | POA: Diagnosis present

## 2016-11-08 DIAGNOSIS — Z6823 Body mass index (BMI) 23.0-23.9, adult: Secondary | ICD-10-CM | POA: Diagnosis not present

## 2016-11-08 DIAGNOSIS — E43 Unspecified severe protein-calorie malnutrition: Secondary | ICD-10-CM | POA: Diagnosis not present

## 2016-11-08 DIAGNOSIS — Z96651 Presence of right artificial knee joint: Secondary | ICD-10-CM | POA: Diagnosis present

## 2016-11-08 DIAGNOSIS — E860 Dehydration: Secondary | ICD-10-CM | POA: Diagnosis present

## 2016-11-08 DIAGNOSIS — Z9981 Dependence on supplemental oxygen: Secondary | ICD-10-CM | POA: Diagnosis not present

## 2016-11-08 DIAGNOSIS — I119 Hypertensive heart disease without heart failure: Secondary | ICD-10-CM | POA: Diagnosis present

## 2016-11-08 DIAGNOSIS — I10 Essential (primary) hypertension: Secondary | ICD-10-CM

## 2016-11-08 DIAGNOSIS — Z8249 Family history of ischemic heart disease and other diseases of the circulatory system: Secondary | ICD-10-CM | POA: Diagnosis not present

## 2016-11-08 DIAGNOSIS — E039 Hypothyroidism, unspecified: Secondary | ICD-10-CM | POA: Diagnosis not present

## 2016-11-08 DIAGNOSIS — R002 Palpitations: Secondary | ICD-10-CM | POA: Diagnosis present

## 2016-11-08 DIAGNOSIS — Z880 Allergy status to penicillin: Secondary | ICD-10-CM | POA: Diagnosis not present

## 2016-11-08 DIAGNOSIS — Z79899 Other long term (current) drug therapy: Secondary | ICD-10-CM | POA: Diagnosis not present

## 2016-11-08 LAB — CBC WITH DIFFERENTIAL/PLATELET
BASOS ABS: 0 10*3/uL (ref 0.0–0.1)
Basophils Absolute: 0 10*3/uL (ref 0.0–0.1)
Basophils Relative: 0 %
Basophils Relative: 0 %
Eosinophils Absolute: 0 10*3/uL (ref 0.0–0.7)
Eosinophils Absolute: 0.1 10*3/uL (ref 0.0–0.7)
Eosinophils Relative: 0 %
Eosinophils Relative: 1 %
HEMATOCRIT: 33.8 % — AB (ref 36.0–46.0)
HEMATOCRIT: 35.1 % — AB (ref 36.0–46.0)
HEMOGLOBIN: 11.3 g/dL — AB (ref 12.0–15.0)
HEMOGLOBIN: 11.5 g/dL — AB (ref 12.0–15.0)
LYMPHS ABS: 0.8 10*3/uL (ref 0.7–4.0)
LYMPHS PCT: 8 %
LYMPHS PCT: 6 %
Lymphs Abs: 1.2 10*3/uL (ref 0.7–4.0)
MCH: 29.8 pg (ref 26.0–34.0)
MCH: 29.6 pg (ref 26.0–34.0)
MCHC: 33.4 g/dL (ref 30.0–36.0)
MCHC: 32.8 g/dL (ref 30.0–36.0)
MCV: 89.2 fL (ref 78.0–100.0)
MCV: 90.5 fL (ref 78.0–100.0)
MONO ABS: 1.6 10*3/uL — AB (ref 0.1–1.0)
MONOS PCT: 14 %
Monocytes Absolute: 2.1 10*3/uL — ABNORMAL HIGH (ref 0.1–1.0)
Monocytes Relative: 10 %
NEUTROS ABS: 13.3 10*3/uL — AB (ref 1.7–7.7)
NEUTROS ABS: 12 10*3/uL — AB (ref 1.7–7.7)
NEUTROS PCT: 82 %
NEUTROS PCT: 79 %
Platelets: 320 10*3/uL (ref 150–400)
Platelets: 366 10*3/uL (ref 150–400)
RBC: 3.79 MIL/uL — AB (ref 3.87–5.11)
RBC: 3.88 MIL/uL (ref 3.87–5.11)
RDW: 16 % — AB (ref 11.5–15.5)
RDW: 16.3 % — ABNORMAL HIGH (ref 11.5–15.5)
WBC: 16.1 10*3/uL — ABNORMAL HIGH (ref 4.0–10.5)
WBC: 15 10*3/uL — AB (ref 4.0–10.5)

## 2016-11-08 LAB — BASIC METABOLIC PANEL
ANION GAP: 6 (ref 5–15)
BUN: 14 mg/dL (ref 6–20)
CO2: 25 mmol/L (ref 22–32)
Calcium: 8.1 mg/dL — ABNORMAL LOW (ref 8.9–10.3)
Chloride: 102 mmol/L (ref 101–111)
Creatinine, Ser: 0.85 mg/dL (ref 0.44–1.00)
GLUCOSE: 95 mg/dL (ref 65–99)
POTASSIUM: 3.9 mmol/L (ref 3.5–5.1)
Sodium: 133 mmol/L — ABNORMAL LOW (ref 135–145)

## 2016-11-08 LAB — ECHOCARDIOGRAM COMPLETE
Height: 61 in
Weight: 1955.2 oz

## 2016-11-08 LAB — TROPONIN I: Troponin I: 0.03 ng/mL (ref <0.03)

## 2016-11-08 MED ORDER — IPRATROPIUM-ALBUTEROL 0.5-2.5 (3) MG/3ML IN SOLN
3.0000 mL | Freq: Three times a day (TID) | RESPIRATORY_TRACT | Status: DC
  Administered 2016-11-08 – 2016-11-09 (×2): 3 mL via RESPIRATORY_TRACT
  Filled 2016-11-08 (×2): qty 3

## 2016-11-08 MED ORDER — METOPROLOL TARTRATE 25 MG PO TABS
25.0000 mg | ORAL_TABLET | Freq: Once | ORAL | Status: AC
  Administered 2016-11-08: 25 mg via ORAL
  Filled 2016-11-08: qty 1

## 2016-11-08 MED ORDER — DEXTROSE 5 % IV SOLN
500.0000 mg | INTRAVENOUS | Status: DC
  Administered 2016-11-08: 500 mg via INTRAVENOUS
  Filled 2016-11-08 (×2): qty 500

## 2016-11-08 MED ORDER — SODIUM CHLORIDE 0.9 % IV SOLN
INTRAVENOUS | Status: DC
  Administered 2016-11-08 (×2): via INTRAVENOUS

## 2016-11-08 NOTE — Progress Notes (Signed)
NP on call notified that patient complained of palpitations.  HR irregular upon radial palpation.  BP 166/44 HR on monitor, HR in  80s.  Sats 96% on 1 L [place]. Patient states that when this occurs at home she takes a dose of 25 mg lopressor.  Schorr, NP gave a one time dose order.  45 minutes after the administration of lopressor patient stated relief from palpitations.  Will continue to monitor patient.

## 2016-11-08 NOTE — Care Management Obs Status (Signed)
[place] NOTIFICATION   Patient Details  Name: [name] MRN: [id number] Date of Birth: 03-May-1933   Medicare Observation Status Notification Given:  Yes    Carles Collet, RN 11/08/2016, 2:49 PM

## 2016-11-08 NOTE — Progress Notes (Signed)
  Echocardiogram 2D Echocardiogram has been performed.  [name] 11/08/2016, 4:45 PM

## 2016-11-08 NOTE — Care Management Note (Addendum)
Case Management Note  Patient Details  Name: SIMONA OVERMAN MRN: [id number] Date of Birth: 1933/04/11  Subjective/Objective:                 Patient form home with husband in obs for syncope, PNA. Home O2 2L PRN through [facility]. Would like to use [facility] for [facility] if needed. Patient states she does not any DME needs. Patient will need walking O2 test and order for continuous home oxygen if needed at DC.   Action/Plan:  CM will continue to follow for [facility] needs. 12/28 Verified with patient's daughter at the bedside that home oxygen is PRN for use at night while sleeping. Patient has used it more prior to admission. Patient to have desat test prior to discharge to assess if oxygen needed continuously. Patient at home with husband 24/7 and son and daughter both live in houses next door and provide very frequent supervision.    Expected Discharge Date:                  Expected Discharge Plan:  Home/Self Care  In-House Referral:     Discharge planning Services  CM Consult  Post Acute Care Choice:    Choice offered to:     DME Arranged:    DME Agency:     HH Arranged:    HH Agency:     Status of Service:  In process, will continue to follow  If discussed at Long Length of Stay Meetings, dates discussed:    Additional Comments:  Carles Collet, RN 11/08/2016, 3:01 PM

## 2016-11-08 NOTE — Progress Notes (Addendum)
Pt unable to void. Bladder scanned pt, with >250 residual. Dr Algis Liming notified, who instructed nurse to re-check bladder in 3 hours. Will make Dr aware of results.   1100 pt voided.

## 2016-11-08 NOTE — Progress Notes (Signed)
Initial Nutrition Assessment  DOCUMENTATION CODES:   Not applicable  INTERVENTION:   -Continue Ensure Enlive po BID, each supplement provides 350 kcal and 20 grams of protein  NUTRITION DIAGNOSIS:   Predicted suboptimal nutrient intake related to  (unintentional wt loss) as evidenced by percent weight loss.  GOAL:   Patient will meet greater than or equal to 90% of their needs  MONITOR:   PO intake, Supplement acceptance, Labs, Weight trends, Skin, I & O's  REASON FOR ASSESSMENT:   Malnutrition Screening Tool    ASSESSMENT:   [name] is a 82 y.o. female  past mental history of anemia, hypertension, hypothyroidism sent to the emergency room with sequelae and collapse. Patient's states that for 5 days she had diarrhea. Multiple episodes large volume. No blood. After this the following day the patient felt weak and collapsed in front of daughter who caught her and laid her down slowly on the floor. Patient was unconscious for 10 seconds. She then was normal after that and refused to go to the hospital. Daughter's plan was to take patient to see her primary care doctor the following day. The morning of that appointment the patient passed out again so her dgtr brought her to the emergency room. Patient has had a nonproductive cough. Denies fevers and chills. No nausea vomiting.  Pt admitted with syncope and collapse.   Pt on the phone at time of visit and did not disengage in conversation upon RD arrival. Noted consumed bottle of Ensure at bedside.   Case discussed with RN, who reports pt reports significant wt loss. Per wt hx, pt has experienced a 23# (15%) wt loss within the past year, which while not significant is concerning.   Pt appeared frail, however, unable to perform nutrition focused physical exam at this time.  Labs reviewed: Na: 133 (on IV supplementation).   Diet Order:  Diet Heart Room service appropriate? Yes; Fluid consistency: Thin  Skin:  Reviewed, no  issues  Last BM:  11/06/16  Height:   Ht Readings from Last 1 Encounters:  11/07/16 5\' 1"  (1.549 m)    Weight:   Wt Readings from Last 1 Encounters:  11/07/16 122 lb 3.2 oz (55.4 kg)    Ideal Body Weight:  47.7 kg  BMI:  Body mass index is 23.09 kg/m.  Estimated Nutritional Needs:   Kcal:  1400-1600  Protein:  65-75 grams  Fluid:  1.4-1.6 L  EDUCATION NEEDS:   Education needs no appropriate at this time  Jusiah Aguayo A. Jimmye Norman, RD, LDN, CDE Pager: [phone] After hours Pager: [phone]

## 2016-11-08 NOTE — Evaluation (Signed)
Physical Therapy Evaluation Patient Details Name: [name] MRN: [id number] DOB: 1933-07-25 Today's Date: 11/08/2016   History of Present Illness  Patient is a 82 y/o female with hx of HTN, bronchitis, PNA, hypothyroidism presents with syncope x2. Bil PNA found on CXR.  Clinical Impression  Patient presents with dyspnea on exertion, generalized weakness and impaired balance s/p above. Tolerated gait training with Min A for balance/safety as pt with right knee instability requiring need to hold onto the rail for support. Will try RW next session. Pt independent PTA.Orthostatic vitals stable. Sp02 ranged from 88-92% on 1L/min 02 with a few standing rest breaks and cues for pursed lip breathing. Encouraged mobility. Hopefully will be able to wean 02 down prior to discharge. Will follow acutely to maximize independence and mobility prior to return home.     Follow Up Recommendations Home health PT;Supervision for mobility/OOB;Supervision/Assistance - 24 hour    Equipment Recommendations  None recommended by PT    Recommendations for Other Services OT consult     Precautions / Restrictions Precautions Precautions: Fall Precaution Comments: watch 02 Restrictions Weight Bearing Restrictions: No      Mobility  Bed Mobility Overal bed mobility: Needs Assistance Bed Mobility: Supine to Sit;Sit to Supine     Supine to sit: Modified independent (Device/Increase time);HOB elevated Sit to supine: Modified independent (Device/Increase time);HOB elevated   General bed mobility comments: No assist needed. No dizziness.   Transfers Overall transfer level: Needs assistance Equipment used: None Transfers: Sit to/from Stand Sit to Stand: Min guard         General transfer comment: Min guard for safety. No dizziness.   Ambulation/Gait Ambulation/Gait assistance: Min assist Ambulation Distance (Feet): 130 Feet Assistive device: None (rail for support) Gait Pattern/deviations:  Step-through pattern;Decreased stride length;Decreased stance time - right;Drifts right/left;Trunk flexed Gait velocity: decreased Gait velocity interpretation: <1.8 ft/sec, indicative of risk for recurrent falls General Gait Details: Slow, unsteady gait with right knee instability and needing to reach for rail for support. Would benefit from use of RW.  Stairs            Wheelchair Mobility    Modified Rankin (Stroke Patients Only)       Balance Overall balance assessment: Needs assistance Sitting-balance support: Feet supported;No upper extremity supported Sitting balance-Leahy Scale: Good     Standing balance support: During functional activity Standing balance-Leahy Scale: Fair Standing balance comment: Able to perform static standing with 1 UE support.                              Pertinent Vitals/Pain Pain Assessment: Faces Faces Pain Scale: Hurts little more Pain Location: chest when taking a deep breath or coughing Pain Intervention(s): Monitored during session    [place] expects to be discharged to:: Private residence Living Arrangements: Spouse/significant other Available Help at Discharge: Family;Available PRN/intermittently Type of Home: House Home Access: Level entry     Home Layout: One level (2 steps to get into kitchen) Home Equipment: Gilford Rile - 2 wheels;Bedside commode;Shower seat      Prior Function Level of Independence: Independent         Comments: Furniture walker at baseline; feeds the cows but rides down in her 4 wheeler. Family lives on the property. 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     Hand Dominance        Extremity/Trunk Assessment   Upper Extremity Assessment Upper Extremity Assessment: Defer  to OT evaluation    Lower Extremity Assessment Lower Extremity Assessment: Generalized weakness    Cervical / Trunk Assessment Cervical / Trunk Assessment: Kyphotic  Communication    Communication: HOH  Cognition Arousal/Alertness: Awake/alert Behavior During Therapy: WFL for tasks assessed/performed Overall Cognitive Status: Within Functional Limits for tasks assessed                      General Comments      Exercises     Assessment/Plan    PT Assessment Patient needs continued PT services  PT Problem List Decreased strength;Decreased mobility;Cardiopulmonary status limiting activity;Decreased activity tolerance;Decreased balance          PT Treatment Interventions DME instruction;Therapeutic activities;Gait training;Therapeutic exercise;Patient/family education;Balance training;Functional mobility training;Stair training    PT Goals (Current goals can be found in the Care Plan section)  Acute Rehab PT Goals Patient Stated Goal: to go home PT Goal Formulation: With patient Time For Goal Achievement: 11/22/16 Potential to Achieve Goals: Good    Frequency Min 3X/week   Barriers to discharge        Co-evaluation               End of Session Equipment Utilized During Treatment: Gait belt Activity Tolerance: Patient tolerated treatment well;Treatment limited secondary to medical complications (Comment) (decreased Sp02) Patient left: in bed;with call bell/phone within reach;with bed alarm set Nurse Communication: Mobility status         Time: [id number] PT Time Calculation (min) (ACUTE ONLY): 26 min   Charges:   PT Evaluation $PT Eval Moderate Complexity: 1 Procedure PT Treatments $Gait Training: 8-22 mins   PT G Codes:        Wendee Hata A Janeane Cozart 11/08/2016, 4:27 PM Wray Kearns, [place], DPT [phone]

## 2016-11-08 NOTE — Progress Notes (Signed)
Pt last voided at midnight.  Tried twice to void but could not.  She stated that she feels like she need to but could not.  Bladder scan shows 70 ml.  She is receiving normal saline at 100 ml/hr.  Will notify MD.

## 2016-11-08 NOTE — Progress Notes (Signed)
PROGRESS NOTE  [name]  [id number] DOB: Jul 23, 1933  DOA: 11/07/2016 PCP: Wende Neighbors, MD   Brief Narrative:  82 year old female with a PMH of anemia, HTN, hypothyroid, iron deficiency anemia, on home oxygen 2 L at night, recent acute diarrhea, profound weight loss (approximately 50 pounds) after denture change 5 years ago, presented to [facility] ED on 11/07/16 with 2 episodes of witnessed syncope and collapse but did not sustain injuries because she was held by her family. Admitted for further evaluation and management.   Assessment & Plan:   Principal Problem:   Syncope and collapse Active Problems:   Hypothyroidism   GERD (gastroesophageal reflux disease)   CAP (community acquired pneumonia)   HTN (hypertension)   1. Syncope and collapse: Unclear etiology. May be related to dehydration from GI losses and poor oral intake. Orthostatic blood pressure checks today negative. Telemetry shows sinus rhythm without arrhythmias. Follow 2-D echo results. Check carotid Dopplers. 2. Community acquired pneumonia: Repeat chest x-ray shows patchy bilateral upper lobe opacity. Continue IV Rocephin and azithromycin. Will need follow-up chest x-ray in 3-4 weeks to ensure resolution of pneumonia findings. Blood cultures 2: Negative to date. 3. Elevated troponin: Mild and has decreased. No chest pain reported. Follow 2-D echo. Unclear significance. 4. GERD: PPI 5. Hypothyroid: Continue Synthroid. Clinically euthyroid. 6. Essential hypertension: Controlled. 7. Anemia: May be dilutional. Follow CBC in a.m.   DVT prophylaxis: Lovenox Code Status: Full Family Communication: Discussed in detail with patient's daughter at bedside. Disposition Plan: DC home when medically stable.   Consultants:   None  Procedures:   None  Antimicrobials:   IV Rocephin and azithromycin.    Subjective: Feels much better. No BM for the last 48 hours. Passing flatus. No chest pain or dyspnea. Mild nonproductive  cough. As per daughter, looks much improved compared to admission.  Objective:  Vitals:   11/08/16 0518 11/08/16 0830 11/08/16 0944 11/08/16 1525  BP: (!) 148/68 140/69  (!) 141/50  Pulse: 75 76  78  Resp:  18  18  Temp: 99.5 F (37.5 C)   98.6 F (37 C)  TempSrc: Oral   Oral  SpO2: 95%  95%   Weight:      Height:        Intake/Output Summary (Last 24 hours) at 11/08/16 1543 Last data filed at 11/08/16 [id number]  Gross per 24 hour  Intake              500 ml  Output              400 ml  Net              100 ml   Filed Weights   11/07/16 1733  Weight: 55.4 kg (122 lb 3.2 oz)    Examination:  General exam: Pleasant elderly female sitting up comfortably in bed eating breakfast this morning. Respiratory system: Slightly diminished breath sounds in the bases but otherwise clear to auscultation. Respiratory effort normal. Cardiovascular system: S1 & S2 heard, RRR. No JVD, murmurs, rubs, gallops or clicks. No pedal edema. Telemetry: Sinus rhythm. Gastrointestinal system: Abdomen is nondistended, soft and nontender. No organomegaly or masses felt. Normal bowel sounds heard. Central nervous system: Alert and oriented. No focal neurological deficits. Extremities: Symmetric 5 x 5 power. Skin: No rashes, lesions or ulcers Psychiatry: Judgement and insight appear normal. Mood & affect appropriate.     Data Reviewed: I have personally reviewed following labs and imaging studies  CBC:  Recent Labs  Lab 11/07/16 1323 11/07/16 2009 11/08/16 0214  WBC 21.6* 18.4* 16.1*  NEUTROABS 18.8*  --  13.3*  HGB 13.3 13.4 11.3*  HCT 40.5 39.7 33.8*  MCV 91.8 89.8 89.2  PLT 338 372 [id number]   Basic Metabolic Panel:  Recent Labs Lab 11/07/16 1323 11/07/16 2009 11/08/16 0214  NA 134*  --  133*  K 4.4  --  3.9  CL 97*  --  102  CO2 26  --  25  GLUCOSE 100*  --  95  BUN 18  --  14  CREATININE 1.01* 0.98 0.85  CALCIUM 8.8*  --  8.1*   GFR: Estimated Creatinine Clearance: 39.2 mL/min (by  C-G formula based on SCr of 0.85 mg/dL). Liver Function Tests: No results for input(s): AST, ALT, ALKPHOS, BILITOT, PROT, ALBUMIN in the last 168 hours. No results for input(s): LIPASE, AMYLASE in the last 168 hours. No results for input(s): AMMONIA in the last 168 hours. Coagulation Profile: No results for input(s): INR, PROTIME in the last 168 hours. Cardiac Enzymes:  Recent Labs Lab 11/07/16 2009 11/08/16 0214  TROPONINI 0.14* 0.03*   BNP (last 3 results) No results for input(s): PROBNP in the last 8760 hours. HbA1C: No results for input(s): HGBA1C in the last 72 hours. CBG: No results for input(s): GLUCAP in the last 168 hours. Lipid Profile: No results for input(s): CHOL, HDL, LDLCALC, TRIG, CHOLHDL, LDLDIRECT in the last 72 hours. Thyroid Function Tests: No results for input(s): TSH, T4TOTAL, FREET4, T3FREE, THYROIDAB in the last 72 hours. Anemia Panel:  Recent Labs  11/07/16 1323  FERRITIN 117    Sepsis Labs:  Recent Labs Lab 11/07/16 1331  LATICACIDVEN 0.99    Recent Results (from the past 240 hour(s))  Urine culture     Status: None   Collection Time: 11/07/16  1:11 PM  Result Value Ref Range Status   Specimen Description URINE, CLEAN CATCH  Final   Special Requests NONE  Final   Culture TEST WILL BE CREDITED  Final   Report Status 11/07/2016 FINAL  Final  Culture, blood (routine x 2) Call MD if unable to obtain prior to antibiotics being given     Status: None (Preliminary result)   Collection Time: 11/07/16  8:07 PM  Result Value Ref Range Status   Specimen Description BLOOD RIGHT ARM  Final   Special Requests BOTTLES DRAWN AEROBIC AND ANAEROBIC 5CC  Final   Culture NO GROWTH < 24 HOURS  Final   Report Status PENDING  Incomplete  Culture, blood (routine x 2) Call MD if unable to obtain prior to antibiotics being given     Status: None (Preliminary result)   Collection Time: 11/07/16  8:14 PM  Result Value Ref Range Status   Specimen Description  BLOOD RIGHT FOREARM  Final   Special Requests IN PEDIATRIC BOTTLE 3CC  Final   Culture NO GROWTH < 24 HOURS  Final   Report Status PENDING  Incomplete         Radiology Studies: Dg Chest 2 View  Result Date: 11/08/2016 CLINICAL DATA:  82 year old female with chest pains, pleuritic pain. Bilateral pneumonia suspected on chest radiographs yesterday. Initial encounter. EXAM: CHEST  2 VIEW COMPARISON:  11/07/2016 and earlier. FINDINGS: Stable patchy bilateral upper lobe opacity, new since October, compatible with bilateral pneumonia. No pleural effusion. No pulmonary edema. No other acute pulmonary opacity. Stable cardiomegaly and mediastinal contours. Calcified aortic atherosclerosis. Visualized tracheal air column is within normal limits. No pneumothorax. Osteopenia. Stable  visualized osseous structures. IMPRESSION: 1. Patchy bilateral upper lobe opacity is unchanged since yesterday. This is nonspecific but compatible with bilateral pneumonia. No pleural effusion. 2. No other acute cardiopulmonary abnormality. 3.  Calcified aortic atherosclerosis. Electronically Signed   By: Genevie Ann M.D.   On: 11/08/2016 08:33   Dg Chest 2 View  Result Date: 11/07/2016 CLINICAL DATA:  Cough and shortness of breath for 2 days. EXAM: CHEST  2 VIEW COMPARISON:  CT chest 09/11/2014. PA and lateral chest 08/14/2016 and 07/12/2016. FINDINGS: The lungs are emphysematous. There is new patchy airspace disease in the upper lobes bilaterally. No pneumothorax or pleural effusion. Heart size is upper normal. Aortic atherosclerosis is noted. IMPRESSION: Patchy bilateral upper lobe airspace disease worrisome for pneumonia. Recommend followup to clearing. Emphysema. Atherosclerosis. Electronically Signed   By: Inge Rise M.D.   On: 11/07/2016 13:41        Scheduled Meds: . cefTRIAXone (ROCEPHIN)  IV  1 g Intravenous Q24H  . digoxin  0.125 mg Oral Daily  . enoxaparin (LOVENOX) injection  40 mg Subcutaneous Q24H  .  feeding supplement (ENSURE ENLIVE)  237 mL Oral BID BM  . hydrALAZINE  10 mg Oral TID  . ipratropium-albuterol  3 mL Nebulization TID  . levothyroxine  112 mcg Oral QAC breakfast  . metoprolol tartrate  25 mg Oral Daily  . pantoprazole  40 mg Oral Daily   Continuous Infusions:   LOS: 0 days      [facility] MD [facility] Pager [phone]  If 7PM-7AM, please contact night-coverage [email or web address] Password [facility] 11/08/2016, 3:43 PM

## 2016-11-08 NOTE — Progress Notes (Signed)
PT eval addendum- added G-codes    11-21-16 1631  PT G-Codes **NOT FOR INPATIENT CLASS**  Functional Assessment Tool Used clinical judgment  Functional Limitation Mobility: Walking and moving around  Mobility: Walking and Moving Around Current Status [id number]) CJ  Mobility: Walking and Moving Around Goal Status [id number]) CI   Wray Kearns, PT, DPT [phone]

## 2016-11-09 ENCOUNTER — Inpatient Hospital Stay (HOSPITAL_COMMUNITY): Payer: Medicare Other

## 2016-11-09 DIAGNOSIS — R55 Syncope and collapse: Secondary | ICD-10-CM

## 2016-11-09 DIAGNOSIS — E43 Unspecified severe protein-calorie malnutrition: Secondary | ICD-10-CM | POA: Insufficient documentation

## 2016-11-09 LAB — VAS US CAROTID
LCCADSYS: 89 cm/s
LEFT ECA DIAS: -13 cm/s
LEFT VERTEBRAL DIAS: 29 cm/s
LICAPDIAS: -15 cm/s
Left CCA dist dias: 22 cm/s
Left CCA prox dias: 19 cm/s
Left CCA prox sys: 91 cm/s
Left ICA dist dias: -27 cm/s
Left ICA dist sys: -102 cm/s
Left ICA prox sys: -62 cm/s
RCCAPDIAS: 17 cm/s
RIGHT ECA DIAS: 13 cm/s
RIGHT VERTEBRAL DIAS: 9 cm/s
Right CCA prox sys: 76 cm/s
Right cca dist sys: -130 cm/s

## 2016-11-09 MED ORDER — METOPROLOL TARTRATE 25 MG PO TABS
25.0000 mg | ORAL_TABLET | Freq: Once | ORAL | Status: AC
  Administered 2016-11-09: 25 mg via ORAL
  Filled 2016-11-09: qty 1

## 2016-11-09 MED ORDER — ACETAMINOPHEN 325 MG PO TABS
650.0000 mg | ORAL_TABLET | ORAL | Status: DC | PRN
  Administered 2016-11-09: 650 mg via ORAL
  Filled 2016-11-09: qty 2

## 2016-11-09 MED ORDER — IPRATROPIUM-ALBUTEROL 0.5-2.5 (3) MG/3ML IN SOLN
3.0000 mL | Freq: Two times a day (BID) | RESPIRATORY_TRACT | Status: DC
  Administered 2016-11-09 – 2016-11-10 (×2): 3 mL via RESPIRATORY_TRACT
  Filled 2016-11-09 (×2): qty 3

## 2016-11-09 MED ORDER — SACCHAROMYCES BOULARDII 250 MG PO CAPS
250.0000 mg | ORAL_CAPSULE | Freq: Two times a day (BID) | ORAL | Status: DC
  Administered 2016-11-09 – 2016-11-10 (×2): 250 mg via ORAL
  Filled 2016-11-09 (×2): qty 1

## 2016-11-09 MED ORDER — AZITHROMYCIN 500 MG PO TABS
500.0000 mg | ORAL_TABLET | Freq: Every day | ORAL | Status: DC
  Administered 2016-11-09: 500 mg via ORAL
  Filled 2016-11-09: qty 1

## 2016-11-09 NOTE — Progress Notes (Signed)
Physical Therapy Treatment Patient Details Name: [name] MRN: [id number] DOB: Apr 01, 1933 Today's Date: 11/09/2016    History of Present Illness Patient is a 82 y/o female with hx of HTN, bronchitis, PNA, hypothyroidism presents with syncope x2. Bil PNA found on CXR.    PT Comments    Pt performed increased gait and required decreased assistance with use of RW.  PTA assessed O2 sats through out treatment.  Continue to recommend PT at home to improve strength and decrease caregiver dependence.  SATURATION QUALIFICATIONS: (This note is used to comply with regulatory documentation for home oxygen)  Patient Saturations on Room Air at Rest = 90%  Patient Saturations on Room Air while Ambulating = 86%  Patient Saturations on 2 Liters of oxygen while Ambulating = 92%  Please briefly explain why patient needs home oxygen:Pt will require Home O2 to maintain O2 saturations at a safe level.   Follow Up Recommendations  Home health PT;Supervision for mobility/OOB;Supervision/Assistance - 24 hour     Equipment Recommendations  None recommended by PT    Recommendations for Other Services       Precautions / Restrictions Precautions Precautions: Fall Precaution Comments: watch 02 Restrictions Weight Bearing Restrictions: No    Mobility  Bed Mobility Overal bed mobility: Needs Assistance;Modified Independent Bed Mobility: Supine to Sit     Supine to sit: Modified independent (Device/Increase time) Sit to supine: Modified independent (Device/Increase time)   General bed mobility comments: Good technique observed.    Transfers Overall transfer level: Needs assistance Equipment used: Rolling walker (2 wheeled) Transfers: Sit to/from Stand Sit to Stand: Supervision         General transfer comment: Cues for hand placement to and from seated surface.    Ambulation/Gait Ambulation/Gait assistance: Min guard Ambulation Distance (Feet): 200 Feet Assistive device: Rolling  walker (2 wheeled) Gait Pattern/deviations: Step-through pattern;Decreased stride length;Decreased stance time - right;Trunk flexed Gait velocity: decreased Gait velocity interpretation: Below normal speed for age/gender General Gait Details: Slow, unsteady gait with right knee instability and buckling but no true LOB. Decreased assist with RW use and able to advance gait distance.     Stairs            Wheelchair Mobility    Modified Rankin (Stroke Patients Only)       Balance Overall balance assessment: Needs assistance Sitting-balance support: Feet supported Sitting balance-Leahy Scale: Good       Standing balance-Leahy Scale: Fair                      Cognition Arousal/Alertness: Awake/alert Behavior During Therapy: WFL for tasks assessed/performed Overall Cognitive Status: Within Functional Limits for tasks assessed                      Exercises      General Comments        Pertinent Vitals/Pain Pain Assessment: No/denies pain Faces Pain Scale: Hurts little more Pain Location: R knee  Pain Intervention(s): Monitored during session    Home Living                      Prior Function            PT Goals (current goals can now be found in the care plan section) Acute Rehab PT Goals Patient Stated Goal: to go home Potential to Achieve Goals: Good Progress towards PT goals: Progressing toward goals    Frequency  Min 3X/week      PT Plan Current plan remains appropriate    Co-evaluation             End of Session Equipment Utilized During Treatment: Gait belt Activity Tolerance: Patient tolerated treatment well Patient left: with bed alarm set;in bed;with call bell/phone within reach;with family/visitor present     Time: 1209-1234 PT Time Calculation (min) (ACUTE ONLY): 25 min  Charges:  $Gait Training: 8-22 mins $Therapeutic Activity: 8-22 mins                    G Codes:      Cristela Blue Dec 03, 2016, 12:41 PM  Governor Rooks, PTA pager [phone]

## 2016-11-09 NOTE — Progress Notes (Signed)
*  PRELIMINARY RESULTS* Vascular Ultrasound Carotid Duplex (Doppler) has been completed.  Preliminary findings: Bilateral: No significant (1-39%) ICA stenosis. Antegrade vertebral flow.   Landry Mellow, RDMS, RVT  11/09/2016, 10:00 AM

## 2016-11-09 NOTE — Progress Notes (Signed)
PROGRESS NOTE  [name]  [id number] DOB: 1933-05-20  DOA: 11/07/2016 PCP: Wende Neighbors, MD   Brief Narrative:  82 year old female with a PMH of anemia, HTN, hypothyroid, iron deficiency anemia, on home oxygen 2 L at night, recent acute diarrhea, profound weight loss (approximately 50 pounds) after denture change 5 years ago, presented to [facility] ED on 11/07/16 with 2 episodes of witnessed syncope and collapse but did not sustain injuries because she was held by her family. Admitted for further evaluation and management.   Assessment & Plan:   Principal Problem:   Syncope and collapse Active Problems:   Hypothyroidism   GERD (gastroesophageal reflux disease)   CAP (community acquired pneumonia)   HTN (hypertension)   Protein-calorie malnutrition, severe   1. Syncope and collapse: Unclear etiology. May be related to dehydration from GI losses and poor oral intake. Orthostatic blood pressure checks 12/27 negative. Telemetry shows sinus rhythm with intermittent frequent PACs but no significant arrhythmias. 2-D echo: LVEF 60-65 percent, grade 2 diastolic dysfunction. Carotid Dopplers: Bilateral: No significant (1-39 percent) ICA stenosis. Antegrade vertebral flow. PT recommends home health PT and supervision. Daughter indicates that she will be able to arrange for 24/7 supervision post discharge and they decline SNF. 2. Community acquired pneumonia: Repeat chest x-ray shows patchy bilateral upper lobe opacity. Continue IV Rocephin and azithromycin. Will need follow-up chest x-ray in 3-4 weeks to ensure resolution of pneumonia findings. Blood cultures 2: Negative to date. Transition to oral antibiotics i.e. amoxicillin at discharge on 12/29. 3. Elevated troponin: Mild and has decreased. No chest pain reported. Follow 2-D echo. Unclear significance. 2-D echo with normal LVEF and no wall motion abnormalities. May consider outpatient cardiology consultation for stress test. 4. GERD:  PPI 5. Hypothyroid: Continue Synthroid. Clinically euthyroid. 6. Essential hypertension: Controlled. 7. Anemia: May be dilutional. Stable. 8. Diarrhea: Patient had diarrhea couple days prior to admission which had improved. Had 2 loose BMs on 12/27. Monitor. Possibly viral etiology. No further testing unless this worsens. Add probiotics. 9. Chronic hypoxic respiratory failure: As per PT evaluation today, desaturates to 86% on room air with ambulation and will need to continue home oxygen. 10. Severe malnutrition in the context of chronic illness: Management per dietitian input.   DVT prophylaxis: Lovenox Code Status: Full Family Communication: Discussed in detail with patient's daughter at bedside. Disposition Plan: DC home when medically stable, possibly 12/29.   Consultants:   None  Procedures:   None  Antimicrobials:   IV Rocephin and azithromycin.    Subjective: 2 lose BMs on 12/27. Reported some palpitations overnight which improved after an extra dose of beta blockers. Denies chest pain, cough or dyspnea.  Objective:  Vitals:   11/09/16 0505 11/09/16 0717 11/09/16 0856 11/09/16 1602  BP: (!) 143/64  124/63 (!) 131/42  Pulse: 64  78 77  Resp: 18   18  Temp: 98.6 F (37 C)     TempSrc:      SpO2: 97% 95%  97%  Weight:      Height:        Intake/Output Summary (Last 24 hours) at 11/09/16 1636 Last data filed at 11/09/16 1300  Gross per 24 hour  Intake          1459.17 ml  Output             2450 ml  Net          -990.83 ml   Filed Weights   11/07/16 1733  Weight: 55.4 kg (  122 lb 3.2 oz)    Examination:  General exam: Pleasant elderly female lying comfortably propped up in bed.  Respiratory system: clear to auscultation. Respiratory effort normal. Cardiovascular system: S1 & S2 heard, RRR. No JVD, murmurs, rubs, gallops or clicks. No pedal edema. Telemetry: Sinus rhythm with intermittent frequent PACs but no other arrhythmias. Gastrointestinal system:  Abdomen is nondistended, soft and nontender. No organomegaly or masses felt. Normal bowel sounds heard. Central nervous system: Alert and oriented. No focal neurological deficits. Extremities: Symmetric 5 x 5 power. Skin: No rashes, lesions or ulcers Psychiatry: Judgement and insight appear normal. Mood & affect appropriate.     Data Reviewed: I have personally reviewed following labs and imaging studies  CBC:  Recent Labs Lab 11/07/16 1323 11/07/16 2009 11/08/16 0214 11/08/16 1639  WBC 21.6* 18.4* 16.1* 15.0*  NEUTROABS 18.8*  --  13.3* 12.0*  HGB 13.3 13.4 11.3* 11.5*  HCT 40.5 39.7 33.8* 35.1*  MCV 91.8 89.8 89.2 90.5  PLT 338 372 320 [id number]   Basic Metabolic Panel:  Recent Labs Lab 11/07/16 1323 11/07/16 2009 11/08/16 0214  NA 134*  --  133*  K 4.4  --  3.9  CL 97*  --  102  CO2 26  --  25  GLUCOSE 100*  --  95  BUN 18  --  14  CREATININE 1.01* 0.98 0.85  CALCIUM 8.8*  --  8.1*   GFR: Estimated Creatinine Clearance: 39.2 mL/min (by C-G formula based on SCr of 0.85 mg/dL). Liver Function Tests: No results for input(s): AST, ALT, ALKPHOS, BILITOT, PROT, ALBUMIN in the last 168 hours. No results for input(s): LIPASE, AMYLASE in the last 168 hours. No results for input(s): AMMONIA in the last 168 hours. Coagulation Profile: No results for input(s): INR, PROTIME in the last 168 hours. Cardiac Enzymes:  Recent Labs Lab 11/07/16 2009 11/08/16 0214  TROPONINI 0.14* 0.03*   BNP (last 3 results) No results for input(s): PROBNP in the last 8760 hours. HbA1C: No results for input(s): HGBA1C in the last 72 hours. CBG: No results for input(s): GLUCAP in the last 168 hours. Lipid Profile: No results for input(s): CHOL, HDL, LDLCALC, TRIG, CHOLHDL, LDLDIRECT in the last 72 hours. Thyroid Function Tests: No results for input(s): TSH, T4TOTAL, FREET4, T3FREE, THYROIDAB in the last 72 hours. Anemia Panel:  Recent Labs  11/07/16 1323  FERRITIN 117    Sepsis  Labs:  Recent Labs Lab 11/07/16 1331  LATICACIDVEN 0.99    Recent Results (from the past 240 hour(s))  Urine culture     Status: None   Collection Time: 11/07/16  1:11 PM  Result Value Ref Range Status   Specimen Description URINE, CLEAN CATCH  Final   Special Requests NONE  Final   Culture TEST WILL BE CREDITED  Final   Report Status 11/07/2016 FINAL  Final  Culture, blood (routine x 2) Call MD if unable to obtain prior to antibiotics being given     Status: None (Preliminary result)   Collection Time: 11/07/16  8:07 PM  Result Value Ref Range Status   Specimen Description BLOOD RIGHT ARM  Final   Special Requests BOTTLES DRAWN AEROBIC AND ANAEROBIC 5CC  Final   Culture NO GROWTH 2 DAYS  Final   Report Status PENDING  Incomplete  Culture, blood (routine x 2) Call MD if unable to obtain prior to antibiotics being given     Status: None (Preliminary result)   Collection Time: 11/07/16  8:14 PM  Result Value Ref Range Status   Specimen Description BLOOD RIGHT FOREARM  Final   Special Requests IN PEDIATRIC BOTTLE 3CC  Final   Culture NO GROWTH 2 DAYS  Final   Report Status PENDING  Incomplete         Radiology Studies: Dg Chest 2 View  Result Date: 11/08/2016 CLINICAL DATA:  82 year old female with chest pains, pleuritic pain. Bilateral pneumonia suspected on chest radiographs yesterday. Initial encounter. EXAM: CHEST  2 VIEW COMPARISON:  11/07/2016 and earlier. FINDINGS: Stable patchy bilateral upper lobe opacity, new since October, compatible with bilateral pneumonia. No pleural effusion. No pulmonary edema. No other acute pulmonary opacity. Stable cardiomegaly and mediastinal contours. Calcified aortic atherosclerosis. Visualized tracheal air column is within normal limits. No pneumothorax. Osteopenia. Stable visualized osseous structures. IMPRESSION: 1. Patchy bilateral upper lobe opacity is unchanged since yesterday. This is nonspecific but compatible with bilateral  pneumonia. No pleural effusion. 2. No other acute cardiopulmonary abnormality. 3.  Calcified aortic atherosclerosis. Electronically Signed   By: Genevie Ann M.D.   On: 11/08/2016 08:33        Scheduled Meds: . azithromycin  500 mg Oral q1800  . cefTRIAXone (ROCEPHIN)  IV  1 g Intravenous Q24H  . digoxin  0.125 mg Oral Daily  . enoxaparin (LOVENOX) injection  40 mg Subcutaneous Q24H  . feeding supplement (ENSURE ENLIVE)  237 mL Oral BID BM  . hydrALAZINE  10 mg Oral TID  . ipratropium-albuterol  3 mL Nebulization BID  . levothyroxine  112 mcg Oral QAC breakfast  . metoprolol tartrate  25 mg Oral Daily  . pantoprazole  40 mg Oral Daily   Continuous Infusions:   LOS: 1 day      [facility], MD [facility] Pager [phone]  If 7PM-7AM, please contact night-coverage [email or web address] Password [facility] 11/09/2016, 4:36 PM

## 2016-11-09 NOTE — Progress Notes (Signed)
Nutrition Follow-up  DOCUMENTATION CODES:   Severe malnutrition in context of chronic illness  INTERVENTION:   -Continue Ensure Enlive po BID, each supplement provides 350 kcal and 20 grams of protein -Downgrade to pureed meats for ease of intake  NUTRITION DIAGNOSIS:   Predicted suboptimal nutrient intake related to  (unintentional wt loss) as evidenced by percent weight loss.  Ongoing  GOAL:   Patient will meet greater than or equal to 90% of their needs  Progressing  MONITOR:   PO intake, Supplement acceptance, Labs, Weight trends, Skin, I & O's  REASON FOR ASSESSMENT:   Malnutrition Screening Tool    ASSESSMENT:   [name] is a 82 y.o. female  past mental history of anemia, hypertension, hypothyroidism sent to the emergency room with sequelae and collapse. Patient's states that for 5 days she had diarrhea. Multiple episodes large volume. No blood. After this the following day the patient felt weak and collapsed in front of daughter who caught her and laid her down slowly on the floor. Patient was unconscious for 10 seconds. She then was normal after that and refused to go to the hospital. Daughter's plan was to take patient to see her primary care doctor the following day. The morning of that appointment the patient passed out again so her dgtr brought her to the emergency room. Patient has had a nonproductive cough. Denies fevers and chills. No nausea vomiting.  Spoke with pt and daughter at bedside. Both express extreme concern over pt's poor oral intake and weight loss. Per pt, she has experienced a 50# wt loss over the past 3 years, due to dentures. Pt daughter is especially concerned with pt's lack of protein in ; she reports pt will chew on meats, but then spit them out. Per pt, she now avoids meat completely due to masticatory difficulty. Home diet staples include mashed potatoes and canned vegetables. Pt also consumes 1-2 bottles of Ensure daily- noted pt consumed  about 50% of Ensure on tray table.    Pt reports appetite is still poor. She is amenable to try pureed meats. RD will modify diet order. Also discussed importance of continuing supplements at home. Educated pt on mechanically altering foods to assist with increased PO intake.  Nutrition-Focused physical exam completed. Findings are moderate to severe fat depletion, moderate to severe muscle depletion, and no edema.   Labs reviewed.   Diet Order:  Diet Heart Room service appropriate? Yes; Fluid consistency: Thin  Skin:  Reviewed, no issues  Last BM:  11/09/16  Height:   Ht Readings from Last 1 Encounters:  11/07/16 5\' 1"  (1.549 m)    Weight:   Wt Readings from Last 1 Encounters:  11/07/16 122 lb 3.2 oz (55.4 kg)    Ideal Body Weight:  47.7 kg  BMI:  Body mass index is 23.09 kg/m.  Estimated Nutritional Needs:   Kcal:  1400-1600  Protein:  65-75 grams  Fluid:  1.4-1.6 L  EDUCATION NEEDS:   Education needs addressed  [name] A. Jimmye Norman, RD, LDN, CDE Pager: [phone] After hours Pager: [phone]

## 2016-11-09 NOTE — Progress Notes (Signed)
Notified Schorr, NP that pt having back pain requesting tynenol and having heart palpations pt requesting metoprolol. Will continue to monitor pt. Ranelle Oyster, rn

## 2016-11-10 ENCOUNTER — Other Ambulatory Visit (HOSPITAL_COMMUNITY): Payer: Medicare Other

## 2016-11-10 ENCOUNTER — Inpatient Hospital Stay (HOSPITAL_COMMUNITY): Payer: Medicare Other

## 2016-11-10 DIAGNOSIS — E039 Hypothyroidism, unspecified: Secondary | ICD-10-CM

## 2016-11-10 LAB — GLUCOSE, CAPILLARY
GLUCOSE-CAPILLARY: 91 mg/dL (ref 65–99)
GLUCOSE-CAPILLARY: 82 mg/dL (ref 65–99)

## 2016-11-10 MED ORDER — ENSURE ENLIVE PO LIQD
237.0000 mL | Freq: Two times a day (BID) | ORAL | Status: AC
Start: 2016-11-10 — End: ?

## 2016-11-10 MED ORDER — SACCHAROMYCES BOULARDII 250 MG PO CAPS: 250.0000 mg | ORAL_CAPSULE | Freq: Two times a day (BID) | ORAL | 0 refills | Status: DC

## 2016-11-10 MED ORDER — CEFUROXIME AXETIL 500 MG PO TABS: 500.0000 mg | ORAL_TABLET | Freq: Two times a day (BID) | ORAL | 0 refills | Status: DC

## 2016-11-10 NOTE — Progress Notes (Signed)
Physical Therapy Treatment Patient Details Name: [name] MRN: [id number] DOB: 08/17/1933 Today's Date: 11/10/2016    History of Present Illness Patient is a 82 y/o female with hx of HTN, bronchitis, PNA, hypothyroidism presents with syncope x2. Bil PNA found on CXR.    PT Comments    Pt is progressing well with her gait and independence.  I advised her to try to wear her hinged knee brace she has at home to see if that helps with her right knee stability during gait.  She and daughter agreed to this plan.  She reports her husband is currently using their walker at home, so she will need one for herself.  She is going home on home O2 at discharge.    Follow Up Recommendations  Home health PT;Supervision for mobility/OOB     Equipment Recommendations  Rolling walker with 5" wheels    Recommendations for Other Services   NA     Precautions / Restrictions Precautions Precautions: Fall Precaution Comments: watch 02 Restrictions Weight Bearing Restrictions: No    Mobility  Bed Mobility Overal bed mobility: Modified Independent Bed Mobility: Supine to Sit     Supine to sit: Modified independent (Device/Increase time);HOB elevated Sit to supine: Modified independent (Device/Increase time);HOB elevated   General bed mobility comments: Pt able to get into and out of bed with HOB elevated and use of railing.  A little extra time needed to complete the tast  Transfers Overall transfer level: Needs assistance Equipment used: Rolling walker (2 wheeled) Transfers: Sit to/from Stand Sit to Stand: Supervision         General transfer comment: supervision for safety  Ambulation/Gait Ambulation/Gait assistance: Min guard Ambulation Distance (Feet): 200 Feet Assistive device: Rolling walker (2 wheeled) Gait Pattern/deviations: Step-through pattern;Antalgic Gait velocity: decreased Gait velocity interpretation: Below normal speed for age/gender General Gait Details: right  leg gives away on her, but she is able to compensate with stability from RW         Balance Overall balance assessment: Needs assistance Sitting-balance support: Feet supported;No upper extremity supported Sitting balance-Leahy Scale: Good     Standing balance support: Single extremity supported;Bilateral upper extremity supported;No upper extremity supported Standing balance-Leahy Scale: Fair                      Cognition Arousal/Alertness: Awake/alert Behavior During Therapy: WFL for tasks assessed/performed Overall Cognitive Status: Within Functional Limits for tasks assessed                      Exercises      General Comments General comments (skin integrity, edema, etc.): O2 sats 90% on 2 L O2 [place], pt walking and talking throughout session with little to no DOE with gait.  HR 79 bpm during gait.       Pertinent Vitals/Pain Pain Assessment: No/denies pain           PT Goals (current goals can now be found in the care plan section) Progress towards PT goals: Progressing toward goals    Frequency    Min 3X/week      PT Plan Current plan remains appropriate       End of Session Equipment Utilized During Treatment: Gait belt;Oxygen Activity Tolerance: Patient limited by fatigue Patient left: in bed;with call bell/phone within reach;with family/visitor present     Time: [id number] PT Time Calculation (min) (ACUTE ONLY): 23 min  Charges:  $Gait Training: 23-37 mins  Barbarann Ehlers Cailan Antonucci, PT, DPT [phone]   11/10/2016, 12:17 PM

## 2016-11-10 NOTE — Discharge Summary (Signed)
Physician Discharge Summary  [name] [id number] DOB: [birth year]  PCP: Wende Neighbors, MD  Admit date: 11/07/2016 Discharge date: 11/10/2016   Recommendations for Outpatient Follow-up:  1. Dr. Wende Neighbors, PCP in 5 days with repeat labs (CBC, BMP & TSH). Please follow up final blood culture results that were sent from the hospital on 11/07/16. 2. Recommend repeat chest x-ray in 3-4 weeks. 3. Dr. Peter Martinique: MDs office will call patient to arrange outpatient event monitor and M.D. follow-up.  Home Health: PT and RN Equipment/Devices: Home oxygen and rolling walker  Discharge Condition: Improved and stable  CODE STATUS: Full  Diet recommendation: Heart healthy diet.  Discharge Diagnoses:  Principal Problem:   Syncope and collapse Active Problems:   Hypothyroidism   GERD (gastroesophageal reflux disease)   CAP (community acquired pneumonia)   HTN (hypertension)   Protein-calorie malnutrition, severe   Brief/Interim Summary: 82 year old female with a PMH of anemia, HTN, hypothyroid, iron deficiency anemia, on home oxygen 2 L at night, recent acute diarrhea, profound weight loss (approximately 50 pounds) after denture change 5 years ago, presented to [facility] ED on 11/07/16 with 2 episodes of witnessed syncope and collapse but did not sustain injuries because she was held by her family. Admitted for further evaluation and management.   Assessment & Plan:   1. Syncope and collapse: Unclear etiology. May be related to dehydration from GI losses and poor oral intake. Orthostatic blood pressure checks 12/27 negative. Telemetry shows sinus rhythm with intermittent frequent PACs but no significant arrhythmias. 2-D echo: LVEF 60-65 percent, grade 2 diastolic dysfunction. Carotid Dopplers: Bilateral: No significant (1-39 percent) ICA stenosis. Antegrade vertebral flow. PT recommends home health PT and supervision. Daughter indicates that she will be able to arrange for 24/7 supervision post  discharge and they decline SNF. Patient has intermittent palpitations but no significant arrhythmias on telemetry apart from PACs. She indicates that she occasionally takes an additional dose of metoprolol at home during the palpitations. Her history is highly suspicious for a couple of ectopic beats rather than run of PACs. Discussed with primary cardiologist and cardiology team will arrange for outpatient event monitor and follow-up. Patient asymptomatic of dizziness or lightheadedness. 2. Community acquired pneumonia: Repeat chest x-ray shows patchy bilateral upper lobe opacity. Treated with IV Rocephin and azithromycin and completed approximately 4 days treatment. Discussed with infectious disease M.D. who recommended changing to Ceftin at discharge and will complete total 7 days course. Will need follow-up chest x-ray in 3-4 weeks to ensure resolution of pneumonia findings. Blood cultures 2: Negative to date.  3. Elevated troponin: Mild and has decreased. No chest pain reported. Unclear significance. 2-D echo with normal LVEF and no wall motion abnormalities. May consider outpatient cardiology consultation for stress test >this was discussed with her primary cardiologist on day of discharge. 4. GERD: PPI 5. Hypothyroid: Continue Synthroid. Clinically euthyroid. 6. Essential hypertension: Controlled. 7. Anemia: May be dilutional. Stable. 8. Diarrhea: Patient had diarrhea couple days prior to admission which had improved. Had 2 loose BMs on 12/27. Possibly viral etiology. No further testing unless this worsens. Added probiotics. Improved. 9. Chronic hypoxic respiratory failure: As per PT evaluation 12/28, desaturates to 86% on room air with ambulation and case management arranged for home oxygen. 10. Severe malnutrition in the context of chronic illness: Management per dietitian input.   Consultants:   None  Procedures:   None   Discharge Instructions  Discharge Instructions    Call MD  for:    Complete by:  As directed    Passing out.   Call MD for:  difficulty breathing, headache or visual disturbances    Complete by:  As directed    Call MD for:  extreme fatigue    Complete by:  As directed    Call MD for:  persistant dizziness or light-headedness    Complete by:  As directed    Call MD for:  temperature >100.4    Complete by:  As directed    Diet - low sodium heart healthy    Complete by:  As directed    Increase activity slowly    Complete by:  As directed        Medication List    TAKE these medications   albuterol (2.5 MG/3ML) 0.083% nebulizer solution Commonly known as:  PROVENTIL Take 2.5 mg by nebulization every 6 (six) hours as needed for wheezing or shortness of breath.   amLODipine 2.5 MG tablet Commonly known as:  NORVASC Take 1 tablet (2.5 mg total) by mouth daily.   cefUROXime 500 MG tablet Commonly known as:  CEFTIN Take 1 tablet (500 mg total) by mouth 2 (two) times daily with a meal.   digoxin 0.125 MG tablet Commonly known as:  DIGOX Take 1 tablet mouth daily except none on Sundays or as directed   feeding supplement (ENSURE ENLIVE) Liqd Take 237 mLs by mouth 2 (two) times daily between meals.   hydrALAZINE 10 MG tablet Commonly known as:  APRESOLINE Take 1 tablet (10 mg total) by mouth 3 (three) times daily.   levothyroxine 112 MCG tablet Commonly known as:  SYNTHROID, LEVOTHROID Take 112 mcg by mouth daily before breakfast.   metoprolol tartrate 25 MG tablet Commonly known as:  LOPRESSOR Take 1 tablet (25 mg total) by mouth daily.   pantoprazole 40 MG tablet Commonly known as:  PROTONIX Take 1 tablet (40 mg total) by mouth daily.   polyethylene glycol powder powder Commonly known as:  GLYCOLAX/MIRALAX Take 17 g by mouth daily as needed for moderate constipation.   saccharomyces boulardii 250 MG capsule Commonly known as:  FLORASTOR Take 1 capsule (250 mg total) by mouth 2 (two) times daily.   Vitamin D-3 1000  units Caps Take 2,000 Units by mouth daily.      Follow-up Information    [facility] Follow up.   Why:  RN and PT to contact in next 1-2 days. Rolling walker and oxygen for home use. Contact information: [place] [address] [phone]        Wende Neighbors, MD. Schedule an appointment as soon as possible for a visit in 5 day(s).   Specialty:  Internal Medicine Why:  To be seen with repeat labs (CBC, BMP & TSH). Will need repeat chest x-ray in 3-4 weeks. Contact information: [place] [address] [phone]        Peter Martinique, MD. Schedule an appointment as soon as possible for a visit.   Specialty:  Cardiology Why:  MDs office will call to arrange outpatient heart monitor and M.D. follow-up. Contact information: [address] [place] [address] [phone]          Allergies  Allergen Reactions  . Avapro [Irbesartan] Other (See Comments)    Cough   . Clarithromycin Other (See Comments)    Unknown   . Losartan Potassium-Hctz Other (See Comments)    Unknown  . Maxzide [Hydrochlorothiazide W-Triamterene] Other (See Comments)  Unknown   . Ziac [Bisoprolol-Hydrochlorothiazide]     Thinks caused itching and cough 08/12/12  . Penicillins Swelling and Rash    Has patient had a PCN reaction causing immediate rash, facial/tongue/throat swelling, SOB or lightheadedness with hypotension: no Has patient had a PCN reaction causing severe rash involving mucus membranes or skin necrosis: no Has patient had a PCN reaction that required hospitalization {no Has patient had a PCN reaction occurring within the last 10 years: {no If all of the above answers are "NO", then may proceed with Cephalosporin use.  [facility] Cross Reactors Rash     Procedures/Studies: Dg Chest 2 View  Result Date: 11/10/2016 CLINICAL DATA:  Follow-up pneumonia EXAM: CHEST  2 VIEW COMPARISON:  11/08/2016, 07/12/2016  FINDINGS: Persistent right upper lobe and left upper lobe airspace disease most concerning for multilobar pneumonia. No other focal parenchymal opacity. Hyperinflated lungs consistent with COPD. Biapical chronic scarring. No pneumothorax. No significant pleural effusion. Stable cardiomegaly. Thoracic aortic atherosclerosis. No acute osseous abnormality. IMPRESSION: Persistent right upper lobe and left upper lobe airspace disease most concerning for multilobar pneumonia. Followup PA and lateral chest X-ray is recommended in 3-4 weeks following trial of antibiotic therapy to ensure resolution and exclude underlying malignancy. Electronically Signed   By: Kathreen Devoid   On: 11/10/2016 10:31   Dg Chest 2 View  Result Date: 11/08/2016 CLINICAL DATA:  82 year old female with chest pains, pleuritic pain. Bilateral pneumonia suspected on chest radiographs yesterday. Initial encounter. EXAM: CHEST  2 VIEW COMPARISON:  11/07/2016 and earlier. FINDINGS: Stable patchy bilateral upper lobe opacity, new since October, compatible with bilateral pneumonia. No pleural effusion. No pulmonary edema. No other acute pulmonary opacity. Stable cardiomegaly and mediastinal contours. Calcified aortic atherosclerosis. Visualized tracheal air column is within normal limits. No pneumothorax. Osteopenia. Stable visualized osseous structures. IMPRESSION: 1. Patchy bilateral upper lobe opacity is unchanged since yesterday. This is nonspecific but compatible with bilateral pneumonia. No pleural effusion. 2. No other acute cardiopulmonary abnormality. 3.  Calcified aortic atherosclerosis. Electronically Signed   By: Genevie Ann M.D.   On: 11/08/2016 08:33   Dg Chest 2 View  Result Date: 11/07/2016 CLINICAL DATA:  Cough and shortness of breath for 2 days. EXAM: CHEST  2 VIEW COMPARISON:  CT chest 09/11/2014. PA and lateral chest 08/14/2016 and 07/12/2016. FINDINGS: The lungs are emphysematous. There is new patchy airspace disease in the upper  lobes bilaterally. No pneumothorax or pleural effusion. Heart size is upper normal. Aortic atherosclerosis is noted. IMPRESSION: Patchy bilateral upper lobe airspace disease worrisome for pneumonia. Recommend followup to clearing. Emphysema. Atherosclerosis. Electronically Signed   By: Inge Rise M.D.   On: 11/07/2016 13:41      Subjective: States that she feels much better. Mild intermittent dry cough. No dyspnea. Having formed BMs without further diarrhea. No pain reported. No dizziness, lightheadedness or chest pain.  Discharge Exam:  Vitals:   11/09/16 2108 11/09/16 2212 11/10/16 0430 11/10/16 0816  BP: (!) 163/65  (!) 130/50 (!) 141/56  Pulse: 85  (!) 59 72  Resp: 19  18   Temp: 99.2 F (37.3 C)  97.9 F (36.6 C)   TempSrc: Oral  Oral   SpO2: 96% 94% 96% 93%  Weight:      Height:        General exam: Pleasant elderly female lying comfortably propped up in bed.  Respiratory system: clear to auscultation. Respiratory effort normal. Cardiovascular system: S1 & S2 heard, RRR. No JVD, murmurs, rubs, gallops  or clicks. No pedal edema. Telemetry: Sinus rhythm with intermittent frequent PACs but no other arrhythmias. Gastrointestinal system: Abdomen is nondistended, soft and nontender. No organomegaly or masses felt. Normal bowel sounds heard. Central nervous system: Alert and oriented. No focal neurological deficits. Extremities: Symmetric 5 x 5 power. Skin: No rashes, lesions or ulcers Psychiatry: Judgement and insight appear normal. Mood & affect appropriate.     The results of significant diagnostics from this hospitalization (including imaging, microbiology, ancillary and laboratory) are listed below for reference.     Microbiology: Recent Results (from the past 240 hour(s))  Urine culture     Status: None   Collection Time: 11/07/16  1:11 PM  Result Value Ref Range Status   Specimen Description URINE, CLEAN CATCH  Final   Special Requests NONE  Final   Culture  TEST WILL BE CREDITED  Final   Report Status 11/07/2016 FINAL  Final  Culture, blood (routine x 2) Call MD if unable to obtain prior to antibiotics being given     Status: None (Preliminary result)   Collection Time: 11/07/16  8:07 PM  Result Value Ref Range Status   Specimen Description BLOOD RIGHT ARM  Final   Special Requests BOTTLES DRAWN AEROBIC AND ANAEROBIC 5CC  Final   Culture NO GROWTH 2 DAYS  Final   Report Status PENDING  Incomplete  Culture, blood (routine x 2) Call MD if unable to obtain prior to antibiotics being given     Status: None (Preliminary result)   Collection Time: 11/07/16  8:14 PM  Result Value Ref Range Status   Specimen Description BLOOD RIGHT FOREARM  Final   Special Requests IN PEDIATRIC BOTTLE 3CC  Final   Culture NO GROWTH 2 DAYS  Final   Report Status PENDING  Incomplete     Labs: BNP (last 3 results)  Recent Labs  11/07/16 1323  BNP [id number]*   Basic Metabolic Panel:  Recent Labs Lab 11/07/16 1323 11/07/16 2009 11/08/16 0214  NA 134*  --  133*  K 4.4  --  3.9  CL 97*  --  102  CO2 26  --  25  GLUCOSE 100*  --  95  BUN 18  --  14  CREATININE 1.01* 0.98 0.85  CALCIUM 8.8*  --  8.1*   CBC:  Recent Labs Lab 11/07/16 1323 11/07/16 2009 11/08/16 0214 11/08/16 1639  WBC 21.6* 18.4* 16.1* 15.0*  NEUTROABS 18.8*  --  13.3* 12.0*  HGB 13.3 13.4 11.3* 11.5*  HCT 40.5 39.7 33.8* 35.1*  MCV 91.8 89.8 89.2 90.5  PLT 338 372 320 366   Cardiac Enzymes:  Recent Labs Lab 11/07/16 2009 11/08/16 0214  TROPONINI 0.14* 0.03*   CBG:  Recent Labs Lab 11/10/16 0754 11/10/16 1218  GLUCAP 91 82   Anemia work up  Recent Labs  11/07/16 1323  FERRITIN 117    Discussed in detail with patient's daughter at bedside. Updated care and answered all questions.  Time coordinating discharge: Over 30 minutes  SIGNED:  Vernell Leep, MD, FACP, [place]. [facility] Pager [phone]  If 7PM-7AM, please contact  night-coverage [email or web address] Password [email or web address] 11/10/2016, 1:13 PM

## 2016-11-10 NOTE — Care Management Note (Signed)
Case Management Note  Patient Details  Name: CHLO… BLEAK MRN: [id number] Date of Birth: 02/19/1933  Subjective/Objective:                 Patient form home with husband in obs for syncope, PNA. Home O2 2L PRN through [facility]. Would like to use [facility] for [facility] if needed. Patient states she does not any DME needs. Patient will need walking O2 test and order for continuous home oxygen if needed at DC.   Action/Plan:  CM will continue to follow for [facility] needs.  12/28 Verified with patient's daughter at the bedside that home oxygen is PRN for use at night while sleeping. Patient has used it more prior to admission. Patient to have desat test prior to discharge to assess if oxygen needed continuously. Patient at home with husband 24/7 and son and daughter both live in houses next door and provide very frequent supervision.   12/29 Patient to DC to home with [facility] RN and PT through [facility], referral made. Patient to have home oxygen continuous at 2L, order placed by MD and referral made to [facility]. Patient will tank delivered to room for transport prior to discahrge.  Expected Discharge Date:                  Expected Discharge Plan:  Home/Self Care  In-House Referral:     Discharge planning Services  CM Consult  Post Acute Care Choice:  Durable Medical Equipment, Home Health Choice offered to:     DME Arranged:  Oxygen DME Agency:  [place] Arranged:  RN, PT [facility] Agency:  [place]  Status of Service:  Completed, signed off  If discussed at [place] of Stay Meetings, dates discussed:    Additional Comments:  Carles Collet, RN 11/10/2016, 11:15 AM

## 2016-11-12 LAB — CULTURE, BLOOD (ROUTINE X 2)
CULTURE: NO GROWTH
CULTURE: NO GROWTH

## 2016-11-14 ENCOUNTER — Telehealth: Payer: Self-pay | Admitting: Cardiology

## 2016-11-14 DIAGNOSIS — R55 Syncope and collapse: Secondary | ICD-10-CM

## 2016-11-14 NOTE — Telephone Encounter (Signed)
Pt's dtr calling regarding pt being d/c from hospital with orders to have a heart monitor-there isn't an order. would Dr. Martinique like to order one or see him first?

## 2016-11-14 NOTE — Telephone Encounter (Signed)
Returned call to patient.Dr.Jordan advised needs 30 day event monitor.Advised to schedule post hospital appointment after monitor.Scheduler will call back to schedule.

## 2016-11-15 DIAGNOSIS — J9611 Chronic respiratory failure with hypoxia: Secondary | ICD-10-CM | POA: Diagnosis not present

## 2016-11-15 DIAGNOSIS — K219 Gastro-esophageal reflux disease without esophagitis: Secondary | ICD-10-CM | POA: Diagnosis not present

## 2016-11-15 DIAGNOSIS — R55 Syncope and collapse: Secondary | ICD-10-CM | POA: Diagnosis not present

## 2016-11-15 DIAGNOSIS — D509 Iron deficiency anemia, unspecified: Secondary | ICD-10-CM | POA: Diagnosis not present

## 2016-11-15 DIAGNOSIS — E43 Unspecified severe protein-calorie malnutrition: Secondary | ICD-10-CM | POA: Diagnosis not present

## 2016-11-15 DIAGNOSIS — I10 Essential (primary) hypertension: Secondary | ICD-10-CM | POA: Diagnosis not present

## 2016-11-15 DIAGNOSIS — I270 Primary pulmonary hypertension: Secondary | ICD-10-CM | POA: Diagnosis not present

## 2016-11-15 DIAGNOSIS — Z9981 Dependence on supplemental oxygen: Secondary | ICD-10-CM | POA: Diagnosis not present

## 2016-11-15 DIAGNOSIS — Z6823 Body mass index (BMI) 23.0-23.9, adult: Secondary | ICD-10-CM | POA: Diagnosis not present

## 2016-11-15 DIAGNOSIS — E039 Hypothyroidism, unspecified: Secondary | ICD-10-CM | POA: Diagnosis not present

## 2016-11-15 DIAGNOSIS — K31819 Angiodysplasia of stomach and duodenum without bleeding: Secondary | ICD-10-CM | POA: Diagnosis not present

## 2016-11-15 DIAGNOSIS — J189 Pneumonia, unspecified organism: Secondary | ICD-10-CM | POA: Diagnosis not present

## 2016-11-15 DIAGNOSIS — Z96651 Presence of right artificial knee joint: Secondary | ICD-10-CM | POA: Diagnosis not present

## 2016-11-15 DIAGNOSIS — I499 Cardiac arrhythmia, unspecified: Secondary | ICD-10-CM | POA: Diagnosis not present

## 2016-11-15 NOTE — Telephone Encounter (Signed)
Spoke to patient 30 day event monitor scheduled 11/21/16 at 12:30 pm at [facility] office.Post hospital appointment scheduled with Dr.Jordan 01/02/17 at 2:30 pm.

## 2016-11-16 DIAGNOSIS — I10 Essential (primary) hypertension: Secondary | ICD-10-CM | POA: Diagnosis not present

## 2016-11-16 DIAGNOSIS — J189 Pneumonia, unspecified organism: Secondary | ICD-10-CM | POA: Diagnosis not present

## 2016-11-16 DIAGNOSIS — K219 Gastro-esophageal reflux disease without esophagitis: Secondary | ICD-10-CM | POA: Diagnosis not present

## 2016-11-16 DIAGNOSIS — J9611 Chronic respiratory failure with hypoxia: Secondary | ICD-10-CM | POA: Diagnosis not present

## 2016-11-16 DIAGNOSIS — K31819 Angiodysplasia of stomach and duodenum without bleeding: Secondary | ICD-10-CM | POA: Diagnosis not present

## 2016-11-16 DIAGNOSIS — E43 Unspecified severe protein-calorie malnutrition: Secondary | ICD-10-CM | POA: Diagnosis not present

## 2016-11-20 DIAGNOSIS — E43 Unspecified severe protein-calorie malnutrition: Secondary | ICD-10-CM | POA: Diagnosis not present

## 2016-11-20 DIAGNOSIS — J9611 Chronic respiratory failure with hypoxia: Secondary | ICD-10-CM | POA: Diagnosis not present

## 2016-11-20 DIAGNOSIS — I10 Essential (primary) hypertension: Secondary | ICD-10-CM | POA: Diagnosis not present

## 2016-11-20 DIAGNOSIS — K219 Gastro-esophageal reflux disease without esophagitis: Secondary | ICD-10-CM | POA: Diagnosis not present

## 2016-11-20 DIAGNOSIS — K31819 Angiodysplasia of stomach and duodenum without bleeding: Secondary | ICD-10-CM | POA: Diagnosis not present

## 2016-11-20 DIAGNOSIS — J189 Pneumonia, unspecified organism: Secondary | ICD-10-CM | POA: Diagnosis not present

## 2016-11-21 ENCOUNTER — Ambulatory Visit (INDEPENDENT_AMBULATORY_CARE_PROVIDER_SITE_OTHER): Payer: Medicare Other

## 2016-11-21 DIAGNOSIS — R55 Syncope and collapse: Secondary | ICD-10-CM | POA: Diagnosis not present

## 2016-11-22 DIAGNOSIS — E43 Unspecified severe protein-calorie malnutrition: Secondary | ICD-10-CM | POA: Diagnosis not present

## 2016-11-22 DIAGNOSIS — L570 Actinic keratosis: Secondary | ICD-10-CM | POA: Diagnosis not present

## 2016-11-22 DIAGNOSIS — I10 Essential (primary) hypertension: Secondary | ICD-10-CM | POA: Diagnosis not present

## 2016-11-22 DIAGNOSIS — Z23 Encounter for immunization: Secondary | ICD-10-CM | POA: Diagnosis not present

## 2016-11-22 DIAGNOSIS — K219 Gastro-esophageal reflux disease without esophagitis: Secondary | ICD-10-CM | POA: Diagnosis not present

## 2016-11-22 DIAGNOSIS — J9611 Chronic respiratory failure with hypoxia: Secondary | ICD-10-CM | POA: Diagnosis not present

## 2016-11-22 DIAGNOSIS — K31819 Angiodysplasia of stomach and duodenum without bleeding: Secondary | ICD-10-CM | POA: Diagnosis not present

## 2016-11-22 DIAGNOSIS — J189 Pneumonia, unspecified organism: Secondary | ICD-10-CM | POA: Diagnosis not present

## 2016-11-23 DIAGNOSIS — E43 Unspecified severe protein-calorie malnutrition: Secondary | ICD-10-CM | POA: Diagnosis not present

## 2016-11-23 DIAGNOSIS — I10 Essential (primary) hypertension: Secondary | ICD-10-CM | POA: Diagnosis not present

## 2016-11-23 DIAGNOSIS — K31819 Angiodysplasia of stomach and duodenum without bleeding: Secondary | ICD-10-CM | POA: Diagnosis not present

## 2016-11-23 DIAGNOSIS — K219 Gastro-esophageal reflux disease without esophagitis: Secondary | ICD-10-CM | POA: Diagnosis not present

## 2016-11-23 DIAGNOSIS — J189 Pneumonia, unspecified organism: Secondary | ICD-10-CM | POA: Diagnosis not present

## 2016-11-23 DIAGNOSIS — J9611 Chronic respiratory failure with hypoxia: Secondary | ICD-10-CM | POA: Diagnosis not present

## 2016-11-24 DIAGNOSIS — E43 Unspecified severe protein-calorie malnutrition: Secondary | ICD-10-CM | POA: Diagnosis not present

## 2016-11-24 DIAGNOSIS — K219 Gastro-esophageal reflux disease without esophagitis: Secondary | ICD-10-CM | POA: Diagnosis not present

## 2016-11-24 DIAGNOSIS — J189 Pneumonia, unspecified organism: Secondary | ICD-10-CM | POA: Diagnosis not present

## 2016-11-24 DIAGNOSIS — K31819 Angiodysplasia of stomach and duodenum without bleeding: Secondary | ICD-10-CM | POA: Diagnosis not present

## 2016-11-24 DIAGNOSIS — I10 Essential (primary) hypertension: Secondary | ICD-10-CM | POA: Diagnosis not present

## 2016-11-24 DIAGNOSIS — J9611 Chronic respiratory failure with hypoxia: Secondary | ICD-10-CM | POA: Diagnosis not present

## 2016-11-27 DIAGNOSIS — J189 Pneumonia, unspecified organism: Secondary | ICD-10-CM | POA: Diagnosis not present

## 2016-11-27 DIAGNOSIS — J9611 Chronic respiratory failure with hypoxia: Secondary | ICD-10-CM | POA: Diagnosis not present

## 2016-11-27 DIAGNOSIS — K219 Gastro-esophageal reflux disease without esophagitis: Secondary | ICD-10-CM | POA: Diagnosis not present

## 2016-11-27 DIAGNOSIS — I10 Essential (primary) hypertension: Secondary | ICD-10-CM | POA: Diagnosis not present

## 2016-11-27 DIAGNOSIS — K31819 Angiodysplasia of stomach and duodenum without bleeding: Secondary | ICD-10-CM | POA: Diagnosis not present

## 2016-11-27 DIAGNOSIS — G5781 Other specified mononeuropathies of right lower limb: Secondary | ICD-10-CM | POA: Diagnosis not present

## 2016-11-27 DIAGNOSIS — G5782 Other specified mononeuropathies of left lower limb: Secondary | ICD-10-CM | POA: Diagnosis not present

## 2016-11-27 DIAGNOSIS — E43 Unspecified severe protein-calorie malnutrition: Secondary | ICD-10-CM | POA: Diagnosis not present

## 2016-11-29 DIAGNOSIS — I10 Essential (primary) hypertension: Secondary | ICD-10-CM | POA: Diagnosis not present

## 2016-11-29 DIAGNOSIS — J189 Pneumonia, unspecified organism: Secondary | ICD-10-CM | POA: Diagnosis not present

## 2016-11-29 DIAGNOSIS — K219 Gastro-esophageal reflux disease without esophagitis: Secondary | ICD-10-CM | POA: Diagnosis not present

## 2016-11-29 DIAGNOSIS — K31819 Angiodysplasia of stomach and duodenum without bleeding: Secondary | ICD-10-CM | POA: Diagnosis not present

## 2016-11-29 DIAGNOSIS — E43 Unspecified severe protein-calorie malnutrition: Secondary | ICD-10-CM | POA: Diagnosis not present

## 2016-11-29 DIAGNOSIS — J9611 Chronic respiratory failure with hypoxia: Secondary | ICD-10-CM | POA: Diagnosis not present

## 2016-12-01 DIAGNOSIS — J9611 Chronic respiratory failure with hypoxia: Secondary | ICD-10-CM | POA: Diagnosis not present

## 2016-12-01 DIAGNOSIS — K219 Gastro-esophageal reflux disease without esophagitis: Secondary | ICD-10-CM | POA: Diagnosis not present

## 2016-12-01 DIAGNOSIS — K31819 Angiodysplasia of stomach and duodenum without bleeding: Secondary | ICD-10-CM | POA: Diagnosis not present

## 2016-12-01 DIAGNOSIS — J189 Pneumonia, unspecified organism: Secondary | ICD-10-CM | POA: Diagnosis not present

## 2016-12-01 DIAGNOSIS — E43 Unspecified severe protein-calorie malnutrition: Secondary | ICD-10-CM | POA: Diagnosis not present

## 2016-12-01 DIAGNOSIS — I10 Essential (primary) hypertension: Secondary | ICD-10-CM | POA: Diagnosis not present

## 2016-12-04 DIAGNOSIS — J189 Pneumonia, unspecified organism: Secondary | ICD-10-CM | POA: Diagnosis not present

## 2016-12-04 DIAGNOSIS — E43 Unspecified severe protein-calorie malnutrition: Secondary | ICD-10-CM | POA: Diagnosis not present

## 2016-12-04 DIAGNOSIS — I10 Essential (primary) hypertension: Secondary | ICD-10-CM | POA: Diagnosis not present

## 2016-12-04 DIAGNOSIS — J9611 Chronic respiratory failure with hypoxia: Secondary | ICD-10-CM | POA: Diagnosis not present

## 2016-12-04 DIAGNOSIS — K219 Gastro-esophageal reflux disease without esophagitis: Secondary | ICD-10-CM | POA: Diagnosis not present

## 2016-12-04 DIAGNOSIS — K31819 Angiodysplasia of stomach and duodenum without bleeding: Secondary | ICD-10-CM | POA: Diagnosis not present

## 2016-12-06 DIAGNOSIS — K219 Gastro-esophageal reflux disease without esophagitis: Secondary | ICD-10-CM | POA: Diagnosis not present

## 2016-12-06 DIAGNOSIS — I10 Essential (primary) hypertension: Secondary | ICD-10-CM | POA: Diagnosis not present

## 2016-12-06 DIAGNOSIS — J9611 Chronic respiratory failure with hypoxia: Secondary | ICD-10-CM | POA: Diagnosis not present

## 2016-12-06 DIAGNOSIS — K31819 Angiodysplasia of stomach and duodenum without bleeding: Secondary | ICD-10-CM | POA: Diagnosis not present

## 2016-12-06 DIAGNOSIS — J189 Pneumonia, unspecified organism: Secondary | ICD-10-CM | POA: Diagnosis not present

## 2016-12-06 DIAGNOSIS — E43 Unspecified severe protein-calorie malnutrition: Secondary | ICD-10-CM | POA: Diagnosis not present

## 2016-12-07 DIAGNOSIS — J9611 Chronic respiratory failure with hypoxia: Secondary | ICD-10-CM | POA: Diagnosis not present

## 2016-12-07 DIAGNOSIS — K31819 Angiodysplasia of stomach and duodenum without bleeding: Secondary | ICD-10-CM | POA: Diagnosis not present

## 2016-12-07 DIAGNOSIS — I10 Essential (primary) hypertension: Secondary | ICD-10-CM | POA: Diagnosis not present

## 2016-12-07 DIAGNOSIS — K219 Gastro-esophageal reflux disease without esophagitis: Secondary | ICD-10-CM | POA: Diagnosis not present

## 2016-12-07 DIAGNOSIS — J189 Pneumonia, unspecified organism: Secondary | ICD-10-CM | POA: Diagnosis not present

## 2016-12-07 DIAGNOSIS — E43 Unspecified severe protein-calorie malnutrition: Secondary | ICD-10-CM | POA: Diagnosis not present

## 2016-12-12 ENCOUNTER — Encounter (HOSPITAL_COMMUNITY): Payer: Medicare Other

## 2016-12-12 ENCOUNTER — Other Ambulatory Visit (HOSPITAL_COMMUNITY): Payer: Medicare Other

## 2016-12-12 ENCOUNTER — Encounter (HOSPITAL_COMMUNITY): Payer: Medicare Other | Attending: Oncology | Admitting: Adult Health

## 2016-12-12 ENCOUNTER — Encounter (INDEPENDENT_AMBULATORY_CARE_PROVIDER_SITE_OTHER): Payer: Self-pay | Admitting: *Deleted

## 2016-12-12 ENCOUNTER — Encounter (HOSPITAL_COMMUNITY): Payer: Self-pay | Admitting: Adult Health

## 2016-12-12 ENCOUNTER — Ambulatory Visit (HOSPITAL_COMMUNITY): Payer: Medicare Other | Admitting: Adult Health

## 2016-12-12 VITALS — BP 167/65 | HR 64 | Resp 18 | Wt 123.3 lb

## 2016-12-12 DIAGNOSIS — K31819 Angiodysplasia of stomach and duodenum without bleeding: Secondary | ICD-10-CM | POA: Insufficient documentation

## 2016-12-12 DIAGNOSIS — R634 Abnormal weight loss: Secondary | ICD-10-CM

## 2016-12-12 DIAGNOSIS — R0609 Other forms of dyspnea: Secondary | ICD-10-CM | POA: Diagnosis not present

## 2016-12-12 DIAGNOSIS — D500 Iron deficiency anemia secondary to blood loss (chronic): Secondary | ICD-10-CM | POA: Insufficient documentation

## 2016-12-12 DIAGNOSIS — Z9889 Other specified postprocedural states: Secondary | ICD-10-CM | POA: Diagnosis not present

## 2016-12-12 DIAGNOSIS — Z5189 Encounter for other specified aftercare: Secondary | ICD-10-CM | POA: Insufficient documentation

## 2016-12-12 DIAGNOSIS — Z79899 Other long term (current) drug therapy: Secondary | ICD-10-CM | POA: Diagnosis not present

## 2016-12-12 LAB — CBC WITH DIFFERENTIAL/PLATELET
BASOS PCT: 1 %
Basophils Absolute: 0.1 10*3/uL (ref 0.0–0.1)
EOS ABS: 0.2 10*3/uL (ref 0.0–0.7)
Eosinophils Relative: 2 %
HCT: 37.2 % (ref 36.0–46.0)
Hemoglobin: 12.1 g/dL (ref 12.0–15.0)
LYMPHS ABS: 0.9 10*3/uL (ref 0.7–4.0)
Lymphocytes Relative: 11 %
MCH: 30 pg (ref 26.0–34.0)
MCHC: 32.5 g/dL (ref 30.0–36.0)
MCV: 92.3 fL (ref 78.0–100.0)
Monocytes Absolute: 0.8 10*3/uL (ref 0.1–1.0)
Monocytes Relative: 10 %
NEUTROS PCT: 76 %
Neutro Abs: 6.1 10*3/uL (ref 1.7–7.7)
PLATELETS: 332 10*3/uL (ref 150–400)
RBC: 4.03 MIL/uL (ref 3.87–5.11)
RDW: 15.7 % — ABNORMAL HIGH (ref 11.5–15.5)
WBC: 8 10*3/uL (ref 4.0–10.5)

## 2016-12-12 NOTE — Patient Instructions (Signed)
[place] at [facility] Discharge Instructions  RECOMMENDATIONS MADE BY THE CONSULTANT AND ANY TEST RESULTS WILL BE SENT TO YOUR REFERRING PHYSICIAN.  Exam with Elzie Rings, NP. Return monthly for labs and then return in 3 months for follow up with the provider.    Thank you for choosing [place] at [facility] to provide your oncology and hematology care.  To afford each patient quality time with our provider, please arrive at least 15 minutes before your scheduled appointment time.    If you have a lab appointment with the [place] please come in thru the  Main Entrance and check in at the main information desk  You need to re-schedule your appointment should you arrive 10 or more minutes late.  We strive to give you quality time with our providers, and arriving late affects you and other patients whose appointments are after yours.  Also, if you no show three or more times for appointments you may be dismissed from the clinic at the providers discretion.     Again, thank you for choosing [facility].  Our hope is that these requests will decrease the amount of time that you wait before being seen by our physicians.       _____________________________________________________________  Should you have questions after your visit to [facility], please contact our office at [phone] between the hours of 8:30 a.m. and 4:30 p.m.  Voicemails left after 4:30 p.m. will not be returned until the following business day.  For prescription refill requests, have your pharmacy contact our office.       Resources For Cancer Patients and their Caregivers ? American Cancer Society: Can assist with transportation, wigs, general needs, runs Look Good Feel Better.        864 381 5337 ? Cancer Care: Provides financial assistance, online support groups, medication/co-pay assistance.  1-800-813-HOPE 909-109-3524) ? [place] Assists [place] cancer patients and their families through emotional , educational and financial support.  9198299023 ? [place] DSS Where to apply for food stamps, Medicaid and utility assistance. 314-724-0971 ? [facility]: Transportation to medical appointments. 223-343-0396 ? Social Security Administration: May apply for disability if have a Stage IV cancer. 8655050494 610-138-4726 ? [employer], Disability and Transit Services: Assists with nutrition, care and transit needs. [place] Support Programs: @10RELATIVEDAYS @ > Cancer Support Group  2nd Tuesday of the month 1pm-2pm, Journey Room  > Creative Journey  3rd Tuesday of the month 1130am-1pm, Journey Room  > Look Good Feel Better  1st Wednesday of the month 10am-12 noon, Journey Room (Call [place] to register [phone])

## 2016-12-12 NOTE — Progress Notes (Addendum)
[place]:  Medical Oncology/Hematology  PCP:  Wende Neighbors, MD [place] [id number]  REASON FOR VISIT:  Follow-up for iron deficiency anemia secondary to GAVE syndrome.   CURRENT THERAPY: IV Injectafer 510 mg prn   HISTORY OF PRESENT ILLNESS: [name] 83 y.o. 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   INTERVAL HISTORY:  [name] presents for continued follow-up for severe iron deficiency anemia secondary to GI blood loss/GAVE (gastric antral vascular ectasia).   [name] is here unaccompanied.  [name] tells me [name] has "had a rough month or so, but I'm getting better."  [name] was admitted to the hospital from 11/07/16-11/10/16 for syncope and community acquired pneumonia.  [name] reports [name] syncopal episode was secondary to having a stomach virus, with poor oral intake and dehydration. For the pneumonia, [name] received IV Rocephin as an inpatient. [name] tells me during [name] Rocephin infusion [name] experienced tachycardia; [name] is now wearing a Holter monitor as recommended by cardiology for further evaluation.    [name] denies it any blood in [name] stools or melena; denies hematuria, bleeding from [name] gums or nose bleeds. [name] denies any pain. [name] is scheduled to see Dr. Laural Golden for follow-up EGD in 01/2017. [name] last IV iron infusion was on 08/30/16.  [name] endorses dyspnea on exertion. [name] appetite is improving since [name] recent hospitalization.  [name] energy levels are slowly improving as well. [name] has had some additional weight loss (~2 lbs in 1 month; ~5 lbs in past 3 months). [name] states, "I really need to eat about 5 smaller meals a day than 3 bigger meals."  [name] supplements [name] intake with Boost/Ensure 1-2 times/day.    [name] tells me that [name] "big toes staying on"; [name] sought orthopedic specialist who started [name] on oral B complex and vitamin C  supplementation; [name] feels as if [name] symptoms are improving.     REVIEW OF SYSTEMS:  Review of Systems  Constitutional: Positive for appetite change (appetite slowly improving ) and fatigue (some fatigue, but improving since hospitalization). Negative for fever.  HENT:  Negative.   Eyes: Negative.   Respiratory: Positive for cough and shortness of breath.   Cardiovascular: Negative.  Negative for chest pain, leg swelling and palpitations.  Gastrointestinal: Negative.  Negative for abdominal pain, blood in stool, constipation, diarrhea, nausea and vomiting.  Endocrine: Negative.   Genitourinary: Negative.  Negative for hematuria and vaginal bleeding.   Musculoskeletal: Negative.   Neurological:       Peripheral neuropathy to toes (chronic)   Hematological: Negative.   Psychiatric/Behavioral: Negative.      PAST MEDICAL/SURGICAL HISTORY:  Past Medical History:  Diagnosis Date  . Anemia   . Chronic bronchitis ([place])   . GAVE (gastric antral vascular ectasia) 09/12/12  . Hypertension   . Hypothyroidism   . Iron deficiency anemia 10/15/2012  . Iron deficiency anemia due to chronic blood loss 04/20/2014  . On home oxygen therapy    "2L/[place] at night" (11/07/2016)  . Palpitations   . Pneumonia 06/2016; 10/2016  . Syncope and collapse 11/06/2016   Past Surgical History:  Procedure Laterality Date  . BACK SURGERY    . COLONOSCOPY  07/24/2012   Procedure: COLONOSCOPY;  Surgeon: Rogene Houston, MD;  [facility];  Service: Endoscopy;  Laterality: N/A;  200  . DILATION AND CURETTAGE OF UTERUS    .  ESOPHAGOGASTRODUODENOSCOPY  09/12/2012   Procedure: ESOPHAGOGASTRODUODENOSCOPY (EGD);  Surgeon: Rogene Houston, MD;  [facility];  Service: Endoscopy;  Laterality: N/A;  325-changed to 200 per Ann-pt moved up to [place] to notify pt  . ESOPHAGOGASTRODUODENOSCOPY  11/01/2012   Procedure: ESOPHAGOGASTRODUODENOSCOPY (EGD);  Surgeon: Rogene Houston, MD;  [facility];  Service: Endoscopy;  Laterality: N/A;  1:20  . ESOPHAGOGASTRODUODENOSCOPY N/A 07/04/2013   Procedure: ESOPHAGOGASTRODUODENOSCOPY (EGD);  Surgeon: Rogene Houston, MD;  [facility];  Service: Endoscopy;  Laterality: N/A;  850  . ESOPHAGOGASTRODUODENOSCOPY N/A 08/06/2013   Procedure: ESOPHAGOGASTRODUODENOSCOPY (EGD);  Surgeon: Rogene Houston, MD;  [facility];  Service: Endoscopy;  Laterality: N/A;  1200  . ESOPHAGOGASTRODUODENOSCOPY N/A 10/31/2013   Procedure: ESOPHAGOGASTRODUODENOSCOPY (EGD);  Surgeon: Rogene Houston, MD;  [facility];  Service: Endoscopy;  Laterality: N/A;  125-moved to [place] notified pt  . ESOPHAGOGASTRODUODENOSCOPY N/A 10/23/2014   Procedure: ESOPHAGOGASTRODUODENOSCOPY (EGD);  Surgeon: Rogene Houston, MD;  [facility];  Service: Endoscopy;  Laterality: N/A;  1020  . ESOPHAGOGASTRODUODENOSCOPY N/A 10/11/2016   Procedure: ESOPHAGOGASTRODUODENOSCOPY (EGD);  Surgeon: Rogene Houston, MD;  [facility];  Service: Endoscopy;  Laterality: N/A;  245  . HEEL SPUR SURGERY Bilateral   . HOT HEMOSTASIS  11/01/2012   Procedure: HOT HEMOSTASIS (ARGON PLASMA COAGULATION/BICAP);  Surgeon: Rogene Houston, MD;  [facility];  Service: Endoscopy;  Laterality: N/A;  . HOT HEMOSTASIS N/A 07/04/2013   Procedure: HOT HEMOSTASIS (ARGON PLASMA COAGULATION/BICAP);  Surgeon: Rogene Houston, MD;  [facility];  Service: Endoscopy;  Laterality: N/A;  . HOT HEMOSTASIS N/A 08/06/2013   Procedure: HOT HEMOSTASIS (ARGON PLASMA COAGULATION/BICAP);  Surgeon: Rogene Houston, MD;  [facility];  Service: Endoscopy;  Laterality: N/A;  . HOT HEMOSTASIS N/A 10/31/2013   Procedure: HOT HEMOSTASIS (ARGON PLASMA COAGULATION/BICAP);  Surgeon: Rogene Houston, MD;  [facility];  Service: Endoscopy;  Laterality: N/A;  . HOT HEMOSTASIS N/A 10/23/2014   Procedure: HOT HEMOSTASIS (ARGON PLASMA  COAGULATION/BICAP);  Surgeon: Rogene Houston, MD;  [facility];  Service: Endoscopy;  Laterality: N/A;  . HOT HEMOSTASIS N/A 10/11/2016   Procedure: HOT HEMOSTASIS (ARGON PLASMA COAGULATION/BICAP);  Surgeon: Rogene Houston, MD;  [facility];  Service: Endoscopy;  Laterality: N/A;  . JOINT REPLACEMENT    . KNEE ARTHROSCOPY Right    "before replacement"  . LUMBAR DISC SURGERY     "bone spur  . LUMBAR FUSION  06/25/2014   L4 & L5  . SHOULDER SURGERY Right    "spur"  . TONSILLECTOMY AND ADENOIDECTOMY    . TOTAL ABDOMINAL HYSTERECTOMY    . TOTAL KNEE ARTHROPLASTY Right   . US ECHOCARDIOGRAPHY  03/12/2006   EF 55-60%    ALLERGIES:  Allergies  Allergen Reactions  . Avapro [Irbesartan] Other (See Comments)    Cough   . Clarithromycin Other (See Comments)    Unknown   . Losartan Potassium-Hctz Other (See Comments)    Unknown  . Maxzide [Hydrochlorothiazide W-Triamterene] Other (See Comments)    Unknown   . Ziac [Bisoprolol-Hydrochlorothiazide]     Thinks caused itching and cough 08/12/12  . Penicillins Swelling and Rash    Has patient had a PCN reaction causing immediate rash, facial/tongue/throat swelling, SOB or lightheadedness with hypotension: no Has patient had a PCN reaction causing severe rash involving mucus membranes or  skin necrosis: no Has patient had a PCN reaction that required hospitalization {no Has patient had a PCN reaction occurring within the last 10 years: {no If all of the above answers are "NO", then may proceed with Cephalosporin use.  [name] Drugs Cross Reactors Rash    CURRENT MEDICATIONS:  Outpatient Encounter Prescriptions as of 12/12/2016  Medication Sig  . albuterol (PROVENTIL) (2.5 MG/3ML) 0.083% nebulizer solution Take 2.5 mg by nebulization every 6 (six) hours as needed for wheezing or shortness of breath.  . Cholecalciferol (VITAMIN D-3) 1000 UNITS CAPS Take 2,000 Units by mouth daily.   . digoxin (DIGOX) 0.125 MG tablet  Take 1 tablet mouth daily except none on Sundays or as directed  . feeding supplement, ENSURE ENLIVE, (ENSURE ENLIVE) LIQD Take 237 mLs by mouth 2 (two) times daily between meals.  . hydrALAZINE (APRESOLINE) 10 MG tablet Take 1 tablet (10 mg total) by mouth 3 (three) times daily.  Marland Kitchen levothyroxine (SYNTHROID, LEVOTHROID) 112 MCG tablet Take 112 mcg by mouth daily before breakfast.  . metoprolol tartrate (LOPRESSOR) 25 MG tablet Take 1 tablet (25 mg total) by mouth daily.  . pantoprazole (PROTONIX) 40 MG tablet Take 1 tablet (40 mg total) by mouth daily.  . polyethylene glycol powder (GLYCOLAX/MIRALAX) powder Take 17 g by mouth daily as needed for moderate constipation.   . saccharomyces boulardii (FLORASTOR) 250 MG capsule Take 1 capsule (250 mg total) by mouth 2 (two) times daily.  . [DISCONTINUED] cefUROXime (CEFTIN) 500 MG tablet Take 1 tablet (500 mg total) by mouth 2 (two) times daily with a meal.  . amLODipine (NORVASC) 2.5 MG tablet Take 1 tablet (2.5 mg total) by mouth daily. (Patient not taking: Reported on 12/12/2016)   No facility-administered encounter medications on file as of 12/12/2016.     SOCIAL HISTORY:  Social History   Social History  . Marital status: Married    Spouse name: N/A  . Number of children: N/A  . Years of education: N/A   Occupational History  . Not on file.   Social History Main Topics  . Smoking status: Never Smoker  . Smokeless tobacco: Never Used  . Alcohol use No  . Drug use: No  . Sexual activity: Not on file   Other Topics Concern  . Not on file   Social History Narrative  . No narrative on file    PHYSICAL EXAMINATION  ECOG PERFORMANCE STATUS: 0 - Asymptomatic  Vitals:   12/12/16 1019  BP: (!) 167/65  Pulse: 64  Resp: 18   Filed Weights   12/12/16 1019  Weight: 123 lb 4.8 oz (55.9 kg)   Physical Exam  Constitutional: [name] is oriented to person, place, and time and well-developed, well-nourished, and in no distress.  HENT:    Head: Normocephalic.  Mouth/Throat: No oropharyngeal exudate.  Eyes: Conjunctivae are normal. Pupils are equal, round, and reactive to light. No scleral icterus.  Neck: Normal range of motion. Neck supple.  Cardiovascular: Normal rate, regular rhythm and normal heart sounds.   Pulmonary/Chest: Effort normal. No respiratory distress. [name] has no wheezes.  More course rhonchi noted to bilateral upper lobes; mild rhonchi to bilateral bases.   Abdominal: Soft. Bowel sounds are normal. [name] exhibits no distension. There is no tenderness.  Musculoskeletal: Normal range of motion.  Lymphadenopathy:    [name] has no cervical adenopathy.  Neurological: [name] is alert and oriented to person, place, and time. Gait normal.  Skin: Skin is warm and dry.  Psychiatric: Mood,  memory, affect and judgment normal.     LABORATORY DATA: I have reviewed the data as listed. CBC    Component Value Date/Time   WBC 8.0 12/12/2016 1000   RBC 4.03 12/12/2016 1000   HGB 12.1 12/12/2016 1000   HCT 37.2 12/12/2016 1000   PLT 332 12/12/2016 1000   MCV 92.3 12/12/2016 1000   MCH 30.0 12/12/2016 1000   MCHC 32.5 12/12/2016 1000   RDW 15.7 (H) 12/12/2016 1000   LYMPHSABS 0.9 12/12/2016 1000   MONOABS 0.8 12/12/2016 1000   EOSABS 0.2 12/12/2016 1000   BASOSABS 0.1 12/12/2016 1000      Chemistry      Component Value Date/Time   NA 133 (L) 11/08/2016 0214   K 3.9 11/08/2016 0214   CL 102 11/08/2016 0214   CO2 25 11/08/2016 0214   BUN 14 11/08/2016 0214   CREATININE 0.85 11/08/2016 0214   CREATININE 1.07 (H) 09/16/2015 0855      Component Value Date/Time   CALCIUM 8.1 (L) 11/08/2016 0214   ALKPHOS 95 07/12/2016 1225   AST 29 07/12/2016 1225   ALT 23 07/12/2016 1225   BILITOT 0.5 07/12/2016 1225     Results for JULIENNE, GUNDY (MRN [id number])   Ref. Range 02/08/2016 10:40 03/10/2016 09:30 04/11/2016 11:10 05/12/2016 10:58 06/08/2016 10:54 07/12/2016 12:30 08/11/2016 12:03 09/12/2016 12:28  Ferritin Latest Ref  Range: 11 - 307 ng/mL 23 12 38 63 19 69 15 223   Results for BRITTIAN, SAWIN (MRN [id number])   Ref. Range 10/10/2016 10:59 11/07/2016 13:23  Ferritin Latest Ref Range: 11 - 307 ng/mL 47 117     DIAGNOSTIC IMAGING/PROCEDURES: Most recent EGD: 10/11/16   CXR: 11/08/16    PENDING LABS:  -Ferritin collected today; results pending.    ASSESSMENT AND PLAN:  Iron deficiency anemia, secondary to chronic GI related blood loss GAVE syndrome Weight loss, unintentional   Iron deficiency anemia secondary to chronic GI blood loss/GAVE syndrome:  -[name] CBC is normal and stable.  Clinically, [name] appears to be doing quite well; denies any frank blood from [name] stools, melena, hematuria, bleeding from [name] nose or gums. [name] hemoglobin is stable today at 12.1. Available laboratory results reviewed with the patient today. [name] serum ferritin level is pending as of this documentation. IV Injectefer will be ordered if ferritin is < 100. [name] knows to call us for worsening symptoms or obvious bleeding.  [name] will continue to follow up with Dr. Laural Golden, with reported next EGD scheduled for sometime in 01/2017.  ADDENDUM:  Ferritin 20 ng/mL. Will order 1 dose of IV Injectafer 750 mg. Supportive therapy care plan built for 1 dose. Patient to be called with results and to get appointment for IV iron scheduled. --gwd,   Results for GRACIELA, TICHY (MRN [id number])  Ref. Range 12/12/2016 10:01  Ferritin Latest Ref Range: 11 - 307 ng/mL 20    Unintentional weight loss:  -[name] has been experiencing slow but unintentional weight loss over the past several months. [name] is also total of about 5 pounds in the past 3 months. Of course, [name] recent hospitalization may have exacerbated [name] weight loss slightly. Encouraged [name] to eat more frequent smaller meals high in protein and calories throughout the day. Also recommended that [name] continue to supplement with Ensure or Boost dietary supplements 1-2 times per day.  Recent  pneumonia:  -[name] continues to have dyspnea on exertion as [name] recovers from recent hospitalization.  Recent chest x-ray completed on 11/10/16 showed  persistent right upper lobe and left upper lobe airspace disease but is concerning for multilobar pneumonia. Recommendations for follow-up PA and lateral chest x-ray in 3-4 weeks per radiologist. [name] does have rhonchi on physical exam to bilateral upper lobes, but [name] remains afebrile and without chills. [name]'s completed antibiotic therapy. Could consider follow-up chest x-ray at [name] next appointment here at cancer center.   Dispo:  -Return to cancer center monthly for labs; replace iron with Injectafer as appropriate.  -Return to cancer center in 3 months for follow-up visit.     No orders of the defined types were placed in this encounter.   All questions were answered. The patient knows to call the clinic with any problems, questions or concerns. We can certainly see the patient much sooner if necessary.  Mike Craze, NP [place] [phone]

## 2016-12-13 DIAGNOSIS — I10 Essential (primary) hypertension: Secondary | ICD-10-CM | POA: Diagnosis not present

## 2016-12-13 DIAGNOSIS — J189 Pneumonia, unspecified organism: Secondary | ICD-10-CM | POA: Diagnosis not present

## 2016-12-13 DIAGNOSIS — E43 Unspecified severe protein-calorie malnutrition: Secondary | ICD-10-CM | POA: Diagnosis not present

## 2016-12-13 DIAGNOSIS — J9611 Chronic respiratory failure with hypoxia: Secondary | ICD-10-CM | POA: Diagnosis not present

## 2016-12-13 DIAGNOSIS — K31819 Angiodysplasia of stomach and duodenum without bleeding: Secondary | ICD-10-CM | POA: Diagnosis not present

## 2016-12-13 DIAGNOSIS — K219 Gastro-esophageal reflux disease without esophagitis: Secondary | ICD-10-CM | POA: Diagnosis not present

## 2016-12-13 LAB — FERRITIN: Ferritin: 20 ng/mL (ref 11–307)

## 2016-12-13 NOTE — Addendum Note (Signed)
Addended by: Holley Bouche on: 12/13/2016 03:46 PM   Modules accepted: Orders

## 2016-12-15 ENCOUNTER — Ambulatory Visit (HOSPITAL_COMMUNITY): Payer: Medicare Other

## 2016-12-15 ENCOUNTER — Encounter (HOSPITAL_COMMUNITY): Payer: Medicare Other | Attending: Hematology & Oncology

## 2016-12-15 ENCOUNTER — Encounter (HOSPITAL_COMMUNITY): Payer: Self-pay

## 2016-12-15 VITALS — BP 145/39 | HR 59 | Temp 97.8°F | Resp 18 | Wt 123.4 lb

## 2016-12-15 DIAGNOSIS — K31819 Angiodysplasia of stomach and duodenum without bleeding: Secondary | ICD-10-CM | POA: Insufficient documentation

## 2016-12-15 DIAGNOSIS — D500 Iron deficiency anemia secondary to blood loss (chronic): Secondary | ICD-10-CM | POA: Insufficient documentation

## 2016-12-15 MED ORDER — FERRIC CARBOXYMALTOSE 750 MG/15ML IV SOLN
750.0000 mg | Freq: Once | INTRAVENOUS | Status: AC
  Administered 2016-12-15: 750 mg via INTRAVENOUS
  Filled 2016-12-15: qty 15

## 2016-12-15 MED ORDER — SODIUM CHLORIDE 0.9 % IV SOLN
INTRAVENOUS | Status: DC
  Administered 2016-12-15: 09:00:00 via INTRAVENOUS

## 2016-12-15 NOTE — Progress Notes (Signed)
Tolerated infusion w/o adverse reaction.  Alert, in no distress.  VSS.  Discharged ambulatory.  

## 2016-12-15 NOTE — Patient Instructions (Signed)
[facility] at [facility] Discharge Instructions  RECOMMENDATIONS MADE BY THE CONSULTANT AND ANY TEST RESULTS WILL BE SENT TO YOUR REFERRING PHYSICIAN.  Iron infusion today. Return as scheduled.   Thank you for choosing [facility] at [facility] to provide your oncology and hematology care.  To afford each patient quality time with our provider, please arrive at least 15 minutes before your scheduled appointment time.    If you have a lab appointment with the Cancer Center please come in thru the  Main Entrance and check in at the main information desk  You need to re-schedule your appointment should you arrive 10 or more minutes late.  We strive to give you quality time with our providers, and arriving late affects you and other patients whose appointments are after yours.  Also, if you no show three or more times for appointments you may be dismissed from the clinic at the providers discretion.     Again, thank you for choosing [facility].  Our hope is that these requests will decrease the amount of time that you wait before being seen by our physicians.       _____________________________________________________________  Should you have questions after your visit to  Cancer Center, please contact our office at [phone] between the hours of 8:30 a.m. and 4:30 p.m.  Voicemails left after 4:30 p.m. will not be returned until the following business day.  For prescription refill requests, have your pharmacy contact our office.       Resources For Cancer Patients and their Caregivers ? American Cancer Society: Can assist with transportation, wigs, general needs, runs Look Good Feel Better.        1-888-227-6333 ? Cancer Care: Provides financial assistance, online support groups, medication/co-pay assistance.  1-800-813-HOPE (4673) ? [facility] Assists [place] cancer patients and their  families through emotional , educational and financial support.  336-427-4357 ? [place] DSS Where to apply for food stamps, Medicaid and utility assistance. 336-342-1394 ? [facility]: Transportation to medical appointments. 336-347-2287 ? Social Security Administration: May apply for disability if have a Stage IV cancer. 336-342-7796 1-800-772-1213 ? [place] Aging, Disability and Transit Services: Assists with nutrition, care and transit needs. 336-349-2343  Cancer Center Support Programs: @10RELATIVEDAYS@ > Cancer Support Group  2nd Tuesday of the month 1pm-2pm, Journey Room  > Creative Journey  3rd Tuesday of the month 1130am-1pm, Journey Room  > Look Good Feel Better  1st Wednesday of the month 10am-12 noon, Journey Room (Call American Cancer Society to register 1-800-395-5775)   

## 2016-12-18 ENCOUNTER — Ambulatory Visit (HOSPITAL_COMMUNITY): Payer: Medicare Other

## 2016-12-18 DIAGNOSIS — J188 Other pneumonia, unspecified organism: Secondary | ICD-10-CM | POA: Diagnosis not present

## 2016-12-20 ENCOUNTER — Telehealth (INDEPENDENT_AMBULATORY_CARE_PROVIDER_SITE_OTHER): Payer: Self-pay | Admitting: *Deleted

## 2016-12-20 ENCOUNTER — Encounter (INDEPENDENT_AMBULATORY_CARE_PROVIDER_SITE_OTHER): Payer: Self-pay | Admitting: *Deleted

## 2016-12-20 NOTE — Telephone Encounter (Signed)
Referring MD/PCP: hall   Procedure: egd  Reason/Indication:  GAVE  Has patient had this procedure before?  Yes, 09/2016  If so, when, by whom and where?    Is there a family history of colon cancer?    Who?  What age when diagnosed?    Is patient diabetic?   no      Does patient have prosthetic heart valve or mechanical valve?  no  Do you have a pacemaker?  no  Has patient ever had endocarditis? no  Has patient had joint replacement within last 12 months?  no  Does patient tend to be constipated or take laxatives? no  Does patient have a history of alcohol/drug use?  no  Is patient on Coumadin, Plavix and/or Aspirin? no  Medications: see epic  Allergies: see epic  Medication Adjustment:   Procedure date & time: 12/29/16 at 1225

## 2016-12-21 ENCOUNTER — Other Ambulatory Visit (INDEPENDENT_AMBULATORY_CARE_PROVIDER_SITE_OTHER): Payer: Self-pay | Admitting: *Deleted

## 2016-12-21 DIAGNOSIS — K31819 Angiodysplasia of stomach and duodenum without bleeding: Secondary | ICD-10-CM

## 2016-12-21 NOTE — Telephone Encounter (Signed)
agree

## 2016-12-22 ENCOUNTER — Other Ambulatory Visit (HOSPITAL_COMMUNITY): Payer: Self-pay | Admitting: Adult Health Nurse Practitioner

## 2016-12-22 ENCOUNTER — Ambulatory Visit (HOSPITAL_COMMUNITY)
Admission: RE | Admit: 2016-12-22 | Discharge: 2016-12-22 | Disposition: A | Discharge status: Home / Self Care | Payer: Medicare Other | Source: Ambulatory Visit | Attending: Adult Health Nurse Practitioner | Admitting: Adult Health Nurse Practitioner

## 2016-12-22 DIAGNOSIS — I700 Atherosclerosis of aorta: Secondary | ICD-10-CM | POA: Insufficient documentation

## 2016-12-22 DIAGNOSIS — J189 Pneumonia, unspecified organism: Secondary | ICD-10-CM | POA: Diagnosis not present

## 2016-12-22 DIAGNOSIS — R918 Other nonspecific abnormal finding of lung field: Secondary | ICD-10-CM | POA: Diagnosis not present

## 2016-12-27 ENCOUNTER — Encounter (HOSPITAL_COMMUNITY)
Admission: RE | Disposition: A | Discharge status: Home / Self Care | Payer: Self-pay | Source: Ambulatory Visit | Attending: Internal Medicine

## 2016-12-27 ENCOUNTER — Ambulatory Visit (HOSPITAL_COMMUNITY)
Admission: RE | Admit: 2016-12-27 | Discharge: 2016-12-27 | Disposition: A | Discharge status: Home / Self Care | Payer: Medicare Other | Source: Ambulatory Visit | Attending: Internal Medicine | Admitting: Internal Medicine

## 2016-12-27 ENCOUNTER — Encounter (HOSPITAL_COMMUNITY): Payer: Self-pay | Admitting: *Deleted

## 2016-12-27 DIAGNOSIS — K31811 Angiodysplasia of stomach and duodenum with bleeding: Secondary | ICD-10-CM | POA: Diagnosis not present

## 2016-12-27 DIAGNOSIS — K922 Gastrointestinal hemorrhage, unspecified: Secondary | ICD-10-CM | POA: Diagnosis not present

## 2016-12-27 DIAGNOSIS — Z96651 Presence of right artificial knee joint: Secondary | ICD-10-CM | POA: Insufficient documentation

## 2016-12-27 DIAGNOSIS — K31819 Angiodysplasia of stomach and duodenum without bleeding: Secondary | ICD-10-CM | POA: Insufficient documentation

## 2016-12-27 DIAGNOSIS — K2980 Duodenitis without bleeding: Secondary | ICD-10-CM | POA: Diagnosis not present

## 2016-12-27 DIAGNOSIS — E039 Hypothyroidism, unspecified: Secondary | ICD-10-CM | POA: Insufficient documentation

## 2016-12-27 DIAGNOSIS — Z9981 Dependence on supplemental oxygen: Secondary | ICD-10-CM | POA: Diagnosis not present

## 2016-12-27 DIAGNOSIS — K317 Polyp of stomach and duodenum: Secondary | ICD-10-CM | POA: Insufficient documentation

## 2016-12-27 DIAGNOSIS — Z79899 Other long term (current) drug therapy: Secondary | ICD-10-CM | POA: Insufficient documentation

## 2016-12-27 DIAGNOSIS — I10 Essential (primary) hypertension: Secondary | ICD-10-CM | POA: Diagnosis not present

## 2016-12-27 DIAGNOSIS — D509 Iron deficiency anemia, unspecified: Secondary | ICD-10-CM | POA: Diagnosis not present

## 2016-12-27 DIAGNOSIS — D500 Iron deficiency anemia secondary to blood loss (chronic): Secondary | ICD-10-CM | POA: Insufficient documentation

## 2016-12-27 HISTORY — PX: ESOPHAGOGASTRODUODENOSCOPY: SHX5428

## 2016-12-27 HISTORY — PX: BIOPSY: SHX5522

## 2016-12-27 SURGERY — EGD (ESOPHAGOGASTRODUODENOSCOPY)
Anesthesia: Moderate Sedation

## 2016-12-27 MED ORDER — MEPERIDINE HCL 50 MG/ML IJ SOLN
INTRAMUSCULAR | Status: AC
  Filled 2016-12-27: qty 1

## 2016-12-27 MED ORDER — SODIUM CHLORIDE 0.9 % IV SOLN
INTRAVENOUS | Status: DC
  Administered 2016-12-27: 10:00:00 via INTRAVENOUS

## 2016-12-27 MED ORDER — MEPERIDINE HCL 50 MG/ML IJ SOLN
INTRAMUSCULAR | Status: DC | PRN
  Administered 2016-12-27: 25 mg via INTRAVENOUS

## 2016-12-27 MED ORDER — STERILE WATER FOR IRRIGATION IR SOLN
Status: DC | PRN
  Administered 2016-12-27: 10:00:00

## 2016-12-27 MED ORDER — MIDAZOLAM HCL 5 MG/5ML IJ SOLN
INTRAMUSCULAR | Status: AC
  Filled 2016-12-27: qty 10

## 2016-12-27 MED ORDER — MIDAZOLAM HCL 5 MG/5ML IJ SOLN
INTRAMUSCULAR | Status: DC | PRN
  Administered 2016-12-27: 2 mg via INTRAVENOUS
  Administered 2016-12-27: 1 mg via INTRAVENOUS

## 2016-12-27 MED ORDER — BUTAMBEN-TETRACAINE-BENZOCAINE 2-2-14 % EX AERO
INHALATION_SPRAY | CUTANEOUS | Status: DC | PRN
  Administered 2016-12-27: 2 via TOPICAL

## 2016-12-27 NOTE — Discharge Instructions (Signed)
Resume usual medications and diet. °No driving for 24 hours. °Physician will call with biopsy results. ° ° °Upper Endoscopy, Care After °Refer to this sheet in the next few weeks. These instructions provide you with information about caring for yourself after your procedure. Your health care provider may also give you more specific instructions. Your treatment has been planned according to current medical practices, but problems sometimes occur. Call your health care provider if you have any problems or questions after your procedure. °What can I expect after the procedure? °After the procedure, it is common to have: °· A sore throat. °· Bloating. °· Nausea. °Follow these instructions at home: °· Follow instructions from your health care provider about what to eat or drink after your procedure. °· Return to your normal activities as told by your health care provider. Ask your health care provider what activities are safe for you. °· Take over-the-counter and prescription medicines only as told by your health care provider. °· Do not drive for 24 hours if you received a sedative. °· Keep all follow-up visits as told by your health care provider. This is important. °Contact a health care provider if: °· You have a sore throat that lasts longer than one day. °· You have trouble swallowing. °Get help right away if: °· You have a fever. °· You vomit blood or your vomit looks like coffee grounds. °· You have bloody, black, or tarry stools. °· You have a severe sore throat or you cannot swallow. °· You have difficulty breathing. °· You have severe pain in your chest or belly. °This information is not intended to replace advice given to you by your health care provider. Make sure you discuss any questions you have with your health care provider. °Document Released: 04/30/2012 Document Revised: 04/06/2016 Document Reviewed: 08/12/2015 °Elsevier Interactive Patient Education © 2017 [employer] ° °

## 2016-12-27 NOTE — H&P (Signed)
[name] is an 83 y.o. female.   Chief Complaint: Patient is here for EGD. HPI: She was 83 year old Caucasian female was recurrent iron deficiency anemia secondary to GI bleed from gastric antral vascular ectasia. She has undergone multiple APC session the past. Last session was over 2 months ago. Her hemoglobin has come 12.1 g but she is requiring intermittent iron infusion. She complains of early satiety. She has no pain nausea or vomiting. She says after she burps she is able to eat some more. Since September 2013 she has lost 40 pounds. However she has not lost any weight loss couple of months. She has not experienced melena or rectal bleeding since her last procedure.  Past Medical History:  Diagnosis Date  . Anemia   . Chronic bronchitis ([place])   . GAVE (gastric antral vascular ectasia) 09/12/12  . Hypertension   . Hypothyroidism   . Iron deficiency anemia 10/15/2012  . Iron deficiency anemia due to chronic blood loss 04/20/2014  . On home oxygen therapy    "2L/[place] at night" (11/07/2016)  . Palpitations   . Pneumonia 06/2016; 10/2016  . Syncope and collapse 11/06/2016    Past Surgical History:  Procedure Laterality Date  . BACK SURGERY    . COLONOSCOPY  07/24/2012   Procedure: COLONOSCOPY;  Surgeon: Rogene Houston, MD;  [facility];  Service: Endoscopy;  Laterality: N/A;  200  . DILATION AND CURETTAGE OF UTERUS    . ESOPHAGOGASTRODUODENOSCOPY  09/12/2012   Procedure: ESOPHAGOGASTRODUODENOSCOPY (EGD);  Surgeon: Rogene Houston, MD;  [facility];  Service: Endoscopy;  Laterality: N/A;  325-changed to 200 per Ann-pt moved up to [place] to notify pt  . ESOPHAGOGASTRODUODENOSCOPY  11/01/2012   Procedure: ESOPHAGOGASTRODUODENOSCOPY (EGD);  Surgeon: Rogene Houston, MD;  [facility];  Service: Endoscopy;  Laterality: N/A;  1:20  . ESOPHAGOGASTRODUODENOSCOPY N/A 07/04/2013   Procedure: ESOPHAGOGASTRODUODENOSCOPY (EGD);  Surgeon: Rogene Houston, MD;   [facility];  Service: Endoscopy;  Laterality: N/A;  850  . ESOPHAGOGASTRODUODENOSCOPY N/A 08/06/2013   Procedure: ESOPHAGOGASTRODUODENOSCOPY (EGD);  Surgeon: Rogene Houston, MD;  [facility];  Service: Endoscopy;  Laterality: N/A;  1200  . ESOPHAGOGASTRODUODENOSCOPY N/A 10/31/2013   Procedure: ESOPHAGOGASTRODUODENOSCOPY (EGD);  Surgeon: Rogene Houston, MD;  [facility];  Service: Endoscopy;  Laterality: N/A;  125-moved to [place] notified pt  . ESOPHAGOGASTRODUODENOSCOPY N/A 10/23/2014   Procedure: ESOPHAGOGASTRODUODENOSCOPY (EGD);  Surgeon: Rogene Houston, MD;  [facility];  Service: Endoscopy;  Laterality: N/A;  1020  . ESOPHAGOGASTRODUODENOSCOPY N/A 10/11/2016   Procedure: ESOPHAGOGASTRODUODENOSCOPY (EGD);  Surgeon: Rogene Houston, MD;  [facility];  Service: Endoscopy;  Laterality: N/A;  245  . HEEL SPUR SURGERY Bilateral   . HOT HEMOSTASIS  11/01/2012   Procedure: HOT HEMOSTASIS (ARGON PLASMA COAGULATION/BICAP);  Surgeon: Rogene Houston, MD;  [facility];  Service: Endoscopy;  Laterality: N/A;  . HOT HEMOSTASIS N/A 07/04/2013   Procedure: HOT HEMOSTASIS (ARGON PLASMA COAGULATION/BICAP);  Surgeon: Rogene Houston, MD;  [facility];  Service: Endoscopy;  Laterality: N/A;  . HOT HEMOSTASIS N/A 08/06/2013   Procedure: HOT HEMOSTASIS (ARGON PLASMA COAGULATION/BICAP);  Surgeon: Rogene Houston, MD;  [facility];  Service: Endoscopy;  Laterality: N/A;  . HOT HEMOSTASIS N/A 10/31/2013   Procedure: HOT HEMOSTASIS (ARGON PLASMA COAGULATION/BICAP);  Surgeon: Rogene Houston, MD;  [facility];  Service: Endoscopy;  Laterality: N/A;  . HOT HEMOSTASIS N/A 10/23/2014   Procedure: HOT HEMOSTASIS (ARGON PLASMA COAGULATION/BICAP);  Surgeon: Rogene Houston, MD;  [facility];  Service: Endoscopy;  Laterality: N/A;  . HOT HEMOSTASIS N/A 10/11/2016   Procedure: HOT HEMOSTASIS (ARGON PLASMA  COAGULATION/BICAP);  Surgeon: Rogene Houston, MD;  [facility];  Service: Endoscopy;  Laterality: N/A;  . JOINT REPLACEMENT    . KNEE ARTHROSCOPY Right    "before replacement"  . LUMBAR DISC SURGERY     "bone spur  . LUMBAR FUSION  06/25/2014   L4 & L5  . SHOULDER SURGERY Right    "spur"  . TONSILLECTOMY AND ADENOIDECTOMY    . TOTAL ABDOMINAL HYSTERECTOMY    . TOTAL KNEE ARTHROPLASTY Right   . US ECHOCARDIOGRAPHY  03/12/2006   EF 55-60%    Family History  Problem Relation Age of Onset  . Hypertension Mother   . Hypertension Father   . Heart attack Father   . Other Sister     aneurysm on heart  . Other Brother     aneurysm on heart   Social History:  reports that she has never smoked. She has never used smokeless tobacco. She reports that she does not drink alcohol or use drugs.  Allergies:  Allergies  Allergen Reactions  . Avapro [Irbesartan] Other (See Comments)    Cough   . Clarithromycin Other (See Comments)    Unknown   . Losartan Potassium-Hctz Other (See Comments)    Unknown  . Maxzide [Hydrochlorothiazide W-Triamterene] Other (See Comments)    Unknown   . Ziac [Bisoprolol-Hydrochlorothiazide]     Thinks caused itching and cough 08/12/12  . Penicillins Swelling and Rash    Has patient had a PCN reaction causing immediate rash, facial/tongue/throat swelling, SOB or lightheadedness with hypotension: no Has patient had a PCN reaction causing severe rash involving mucus membranes or skin necrosis: no Has patient had a PCN reaction that required hospitalization {no Has patient had a PCN reaction occurring within the last 10 years: {no If all of the above answers are "NO", then may proceed with Cephalosporin use.  . Sulfa Drugs Cross Reactors Rash    Medications Prior to Admission  Medication Sig Dispense Refill  . Ascorbic Acid (VITAMIN C) 1000 MG tablet Take 1,000 mg by mouth 2 (two) times daily.    Marland Kitchen b complex vitamins capsule Take 1 capsule by  mouth daily.    . Cholecalciferol (VITAMIN D) 2000 units CAPS Take 2,000 Units by mouth daily.    . digoxin (DIGOX) 0.125 MG tablet Take 1 tablet mouth daily except none on Sundays or as directed 30 tablet 5  . feeding supplement, ENSURE ENLIVE, (ENSURE ENLIVE) LIQD Take 237 mLs by mouth 2 (two) times daily between meals.    . furosemide (LASIX) 20 MG tablet Take 20 mg by mouth daily as needed for fluid.    . hydrALAZINE (APRESOLINE) 10 MG tablet Take 1 tablet (10 mg total) by mouth 3 (three) times daily. 270 tablet 3  . levothyroxine (SYNTHROID, LEVOTHROID) 112 MCG tablet Take 112 mcg by mouth daily before breakfast.    . metoprolol tartrate (LOPRESSOR) 25 MG tablet Take 1 tablet (25 mg total) by mouth daily. 90 tablet 1  . pantoprazole (PROTONIX) 40 MG tablet Take 1 tablet (40 mg total) by mouth daily. 90 tablet 3  . polyethylene glycol powder (GLYCOLAX/MIRALAX) powder Take 17 g by mouth daily as needed for moderate constipation.     Marland Kitchen  potassium chloride (K-DUR) 10 MEQ tablet Take 10 mEq by mouth daily as needed for fluid.    Marland Kitchen albuterol (PROVENTIL) (2.5 MG/3ML) 0.083% nebulizer solution Take 2.5 mg by nebulization every 6 (six) hours as needed for wheezing or shortness of breath.    Marland Kitchen amLODipine (NORVASC) 2.5 MG tablet Take 1 tablet (2.5 mg total) by mouth daily. (Patient not taking: Reported on 12/12/2016) 90 tablet 3  . saccharomyces boulardii (FLORASTOR) 250 MG capsule Take 1 capsule (250 mg total) by mouth 2 (two) times daily. (Patient not taking: Reported on 12/26/2016) 30 capsule 0    No results found for this or any previous visit (from the past 48 hour(s)). No results found.  ROS  Blood pressure (!) 169/66, pulse (!) 57, temperature 97.7 F (36.5 C), temperature source Oral, resp. rate 19, height 5' (1.524 m), weight 123 lb (55.8 kg), SpO2 99 %. Physical Exam  Constitutional:  Well-developed thin Caucasian female in NAD.  HENT:  Mouth/Throat: Oropharynx is clear and moist.  Eyes:  Conjunctivae are normal. No scleral icterus.  Neck: No thyromegaly present.  Cardiovascular: Normal rate, regular rhythm and normal heart sounds.   No murmur heard. Respiratory: Effort normal and breath sounds normal.  GI:  Abdomen is symmetrical soft and nontender. No organomegaly or masses.  Musculoskeletal: She exhibits no edema.  Lymphadenopathy:    She has no cervical adenopathy.  Neurological: She is alert.  Skin: Skin is warm and dry.     Assessment/Plan Iron deficiency anemia due to chronic GI bleed due to GAVE. EGD with therapeutic intervention.   Hildred Laser, MD 12/27/2016, 10:02 AM

## 2016-12-27 NOTE — Op Note (Signed)
[facility] Patient Name: [name] Procedure Date: 12/27/2016 9:58 AM MRN: [id number] Date of Birth: 1933-05-27 Attending MD: Hildred Laser , MD CSN: [id number] Age: 83 Admit Type: Outpatient Procedure:                Upper GI endoscopy Indications:              Iron deficiency anemia secondary to chronic blood                            loss, For therapy of gastrointestinal bleeding Providers:                Hildred Laser, MD, Otis Peak B. Sharon Seller, RN, Isabella Stalling, Technician Referring MD:             [name], MD Medicines:                Cetacaine spray, Meperidine 25 mg IV, Midazolam 3                            mg IV Complications:            No immediate complications. Estimated Blood Loss:     Estimated blood loss was minimal. Procedure:                Pre-Anesthesia Assessment:                           - Prior to the procedure, a History and Physical                            was performed, and patient medications and                            allergies were reviewed. The patient's tolerance of                            previous anesthesia was also reviewed. The risks                            and benefits of the procedure and the sedation                            options and risks were discussed with the patient.                            All questions were answered, and informed consent                            was obtained. Prior Anticoagulants: The patient has                            taken no previous anticoagulant or antiplatelet  agents. ASA Grade Assessment: III - A patient with                            severe systemic disease. After reviewing the risks                            and benefits, the patient was deemed in                            satisfactory condition to undergo the procedure.                           After obtaining informed consent, the endoscope was      passed under direct vision. Throughout the                            procedure, the patient's blood pressure, pulse, and                            oxygen saturations were monitored continuously. The                            [id number]) scope was introduced through the                            mouth, and advanced to the second part of duodenum.                            The upper GI endoscopy was accomplished without                            difficulty. The patient tolerated the procedure                            well. Scope In: 10:13:48 AM Scope Out: 10:35:02 AM Total Procedure Duration: 0 hours 21 minutes 14 seconds  Findings:      The examined esophagus was normal.      The Z-line was regular and was found 40 cm from the incisors.      A few small sessile polyps with no stigmata of recent bleeding were       found in the gastric body.      Moderate gastric antral vascular ectasia with bleeding was present in       the gastric antrum and in the prepyloric region of the stomach overlying       nodular polypoidal fold most circumfrential.. Coagulation for bleeding       prevention using argon plasma was successful. Biopsies were taken with a       cold forceps for histology. Part of this lesion was removed with a hot       snare. Polyp resection was incomplete. The resected tissue was retrieved       with roth net. The pathology specimen was placed into Bottle Number 1.      The exam of the stomach was otherwise normal.      Patchy mild inflammation characterized by  congestion (edema), erythema       and granularity was found in the duodenal bulb.      The second portion of the duodenum was normal. Impression:               - Normal esophagus.                           - Z-line regular, 40 cm from the incisors.                           - A few gastric polyps.                           - Gastric antral vascular ectasia with bleeding                             covering polypoidal nodular mucosa which is almost                            circumferential. Treated with argon plasma                            coagulation (APC). Biopsy taken long with snare                            polypectomy with removal of 10 mm of this lesion.                           - Chronic duodenitis.                           - Normal second portion of the duodenum. Moderate Sedation:      Moderate (conscious) sedation was administered by the endoscopy nurse       and supervised by the endoscopist. The following parameters were       monitored: oxygen saturation, heart rate, blood pressure, CO2       capnography and response to care. Total physician intraservice time was       25 minutes. Recommendation:           - Patient has a contact number available for                            emergencies. The signs and symptoms of potential                            delayed complications were discussed with the                            patient. Return to normal activities tomorrow.                            Written discharge instructions were provided to the                            patient.                           -  Resume previous diet today.                           - Continue present medications.                           - Await pathology results.                           - Repeat upper endoscopy PRN. Procedure Code(s):        --- Professional ---                           [id number], Esophagogastroduodenoscopy, flexible,                            transoral; with removal of tumor(s), polyp(s), or                            other lesion(s) by snare technique                           99152, Moderate sedation services provided by the                            same physician or other qualified health care                            professional performing the diagnostic or                            therapeutic service that the sedation supports,                             requiring the presence of an independent trained                            observer to assist in the monitoring of the                            patient's level of consciousness and physiological                            status; initial 15 minutes of intraservice time,                            patient age 39 years or older                           [phone], Moderate sedation services; each additional                            15 minutes intraservice time Diagnosis Code(s):        --- Professional ---  K31.7, Polyp of stomach and duodenum                           K31.811, Angiodysplasia of stomach and duodenum                            with bleeding                           K29.80, Duodenitis without bleeding                           D50.0, Iron deficiency anemia secondary to blood                            loss (chronic)                           K92.2, Gastrointestinal hemorrhage, unspecified CPT copyright 2016 American Medical Association. All rights reserved. The codes documented in this report are preliminary and upon coder review may  be revised to meet current compliance requirements. Hildred Laser, MD Hildred Laser, MD 12/27/2016 10:55:55 AM This report has been signed electronically. Number of Addenda: 0

## 2016-12-28 ENCOUNTER — Telehealth: Payer: Self-pay | Admitting: Cardiology

## 2016-12-28 DIAGNOSIS — J189 Pneumonia, unspecified organism: Secondary | ICD-10-CM | POA: Diagnosis not present

## 2016-12-28 DIAGNOSIS — J9611 Chronic respiratory failure with hypoxia: Secondary | ICD-10-CM | POA: Diagnosis not present

## 2016-12-28 DIAGNOSIS — K31819 Angiodysplasia of stomach and duodenum without bleeding: Secondary | ICD-10-CM | POA: Diagnosis not present

## 2016-12-28 DIAGNOSIS — E43 Unspecified severe protein-calorie malnutrition: Secondary | ICD-10-CM | POA: Diagnosis not present

## 2016-12-28 DIAGNOSIS — I10 Essential (primary) hypertension: Secondary | ICD-10-CM | POA: Diagnosis not present

## 2016-12-28 DIAGNOSIS — K219 Gastro-esophageal reflux disease without esophagitis: Secondary | ICD-10-CM | POA: Diagnosis not present

## 2016-12-28 NOTE — Telephone Encounter (Signed)
New Message   Pt calling for her results for her heart monitor

## 2016-12-28 NOTE — Telephone Encounter (Signed)
Returned call to patient monitor results given. 

## 2017-01-02 ENCOUNTER — Ambulatory Visit: Payer: Medicare Other | Admitting: Cardiology

## 2017-01-03 ENCOUNTER — Encounter (INDEPENDENT_AMBULATORY_CARE_PROVIDER_SITE_OTHER): Payer: Self-pay | Admitting: Internal Medicine

## 2017-01-03 NOTE — Progress Notes (Signed)
Patient was given an appointment for 04/17/17 at 2:15pm with Dr. Laural Golden.  A letter was mailed to the patient.

## 2017-01-10 ENCOUNTER — Encounter (HOSPITAL_COMMUNITY): Payer: Medicare Other

## 2017-01-10 DIAGNOSIS — R634 Abnormal weight loss: Secondary | ICD-10-CM

## 2017-01-10 DIAGNOSIS — D500 Iron deficiency anemia secondary to blood loss (chronic): Secondary | ICD-10-CM

## 2017-01-10 DIAGNOSIS — K31819 Angiodysplasia of stomach and duodenum without bleeding: Secondary | ICD-10-CM | POA: Diagnosis not present

## 2017-01-10 LAB — CBC WITH DIFFERENTIAL/PLATELET
Basophils Absolute: 0.1 10*3/uL (ref 0.0–0.1)
Basophils Relative: 1 %
EOS PCT: 2 %
Eosinophils Absolute: 0.1 10*3/uL (ref 0.0–0.7)
HCT: 39.5 % (ref 36.0–46.0)
Hemoglobin: 12.8 g/dL (ref 12.0–15.0)
LYMPHS ABS: 0.8 10*3/uL (ref 0.7–4.0)
Lymphocytes Relative: 11 %
MCH: 31.2 pg (ref 26.0–34.0)
MCHC: 32.4 g/dL (ref 30.0–36.0)
MCV: 96.3 fL (ref 78.0–100.0)
MONO ABS: 1 10*3/uL (ref 0.1–1.0)
Monocytes Relative: 13 %
Neutro Abs: 5.7 10*3/uL (ref 1.7–7.7)
Neutrophils Relative %: 73 %
PLATELETS: 292 10*3/uL (ref 150–400)
RBC: 4.1 MIL/uL (ref 3.87–5.11)
RDW: 17.3 % — AB (ref 11.5–15.5)
WBC: 7.6 10*3/uL (ref 4.0–10.5)

## 2017-01-10 LAB — FERRITIN: Ferritin: 86 ng/mL (ref 11–307)

## 2017-01-11 DIAGNOSIS — K219 Gastro-esophageal reflux disease without esophagitis: Secondary | ICD-10-CM | POA: Diagnosis not present

## 2017-01-11 DIAGNOSIS — I10 Essential (primary) hypertension: Secondary | ICD-10-CM | POA: Diagnosis not present

## 2017-01-11 DIAGNOSIS — J9611 Chronic respiratory failure with hypoxia: Secondary | ICD-10-CM | POA: Diagnosis not present

## 2017-01-11 DIAGNOSIS — J189 Pneumonia, unspecified organism: Secondary | ICD-10-CM | POA: Diagnosis not present

## 2017-01-11 DIAGNOSIS — K31819 Angiodysplasia of stomach and duodenum without bleeding: Secondary | ICD-10-CM | POA: Diagnosis not present

## 2017-01-11 DIAGNOSIS — E43 Unspecified severe protein-calorie malnutrition: Secondary | ICD-10-CM | POA: Diagnosis not present

## 2017-01-17 ENCOUNTER — Encounter (HOSPITAL_COMMUNITY): Payer: Self-pay | Admitting: Internal Medicine

## 2017-01-29 ENCOUNTER — Telehealth: Payer: Self-pay | Admitting: Internal Medicine

## 2017-01-29 NOTE — Telephone Encounter (Signed)
Spoke with Wyatt Portela at [facility], following up on a CMN from January regarding pt's 02 orders.  Wyatt Portela will resend CMN to office.  Will await fax.

## 2017-01-30 NOTE — Telephone Encounter (Signed)
Rodena Piety, Please advise if you have CMN on patient. Thanks.

## 2017-02-01 NOTE — Telephone Encounter (Signed)
Dr. Melvyn Novas signed this CMN on 03/21 and I faxed the form to [facility] 02/01/2017 in the morning. I just called Brianca and explained this to her

## 2017-02-01 NOTE — Telephone Encounter (Signed)
Anita please advise. thanks 

## 2017-02-05 DIAGNOSIS — G8929 Other chronic pain: Secondary | ICD-10-CM | POA: Diagnosis not present

## 2017-02-05 DIAGNOSIS — M79605 Pain in left leg: Secondary | ICD-10-CM | POA: Diagnosis not present

## 2017-02-05 DIAGNOSIS — M79604 Pain in right leg: Secondary | ICD-10-CM | POA: Diagnosis not present

## 2017-02-07 ENCOUNTER — Encounter (HOSPITAL_COMMUNITY): Payer: Medicare Other | Attending: Oncology

## 2017-02-07 DIAGNOSIS — K31819 Angiodysplasia of stomach and duodenum without bleeding: Secondary | ICD-10-CM | POA: Diagnosis not present

## 2017-02-07 DIAGNOSIS — R634 Abnormal weight loss: Secondary | ICD-10-CM | POA: Insufficient documentation

## 2017-02-07 DIAGNOSIS — D500 Iron deficiency anemia secondary to blood loss (chronic): Secondary | ICD-10-CM | POA: Diagnosis not present

## 2017-02-07 LAB — CBC WITH DIFFERENTIAL/PLATELET
BASOS ABS: 0.1 10*3/uL (ref 0.0–0.1)
BASOS PCT: 1 %
EOS ABS: 0.2 10*3/uL (ref 0.0–0.7)
EOS PCT: 2 %
HCT: 40.6 % (ref 36.0–46.0)
Hemoglobin: 13.2 g/dL (ref 12.0–15.0)
Lymphocytes Relative: 11 %
Lymphs Abs: 0.9 10*3/uL (ref 0.7–4.0)
MCH: 31.2 pg (ref 26.0–34.0)
MCHC: 32.5 g/dL (ref 30.0–36.0)
MCV: 96 fL (ref 78.0–100.0)
MONO ABS: 1.1 10*3/uL — AB (ref 0.1–1.0)
Monocytes Relative: 13 %
Neutro Abs: 6.3 10*3/uL (ref 1.7–7.7)
Neutrophils Relative %: 73 %
PLATELETS: 250 10*3/uL (ref 150–400)
RBC: 4.23 MIL/uL (ref 3.87–5.11)
RDW: 15.6 % — AB (ref 11.5–15.5)
WBC: 8.5 10*3/uL (ref 4.0–10.5)

## 2017-02-07 LAB — FERRITIN: Ferritin: 27 ng/mL (ref 11–307)

## 2017-02-08 ENCOUNTER — Other Ambulatory Visit (HOSPITAL_COMMUNITY): Payer: Self-pay | Admitting: Adult Health

## 2017-02-15 ENCOUNTER — Other Ambulatory Visit: Payer: Self-pay | Admitting: Vascular Surgery

## 2017-02-15 DIAGNOSIS — I83813 Varicose veins of bilateral lower extremities with pain: Secondary | ICD-10-CM

## 2017-02-16 ENCOUNTER — Other Ambulatory Visit (HOSPITAL_COMMUNITY): Payer: Self-pay | Admitting: Pharmacist

## 2017-02-16 ENCOUNTER — Encounter (HOSPITAL_COMMUNITY): Payer: Medicare Other | Attending: Hematology & Oncology

## 2017-02-16 ENCOUNTER — Encounter (HOSPITAL_COMMUNITY): Payer: Self-pay

## 2017-02-16 VITALS — BP 156/56 | HR 59 | Temp 97.4°F | Resp 18

## 2017-02-16 DIAGNOSIS — D500 Iron deficiency anemia secondary to blood loss (chronic): Secondary | ICD-10-CM | POA: Diagnosis present

## 2017-02-16 DIAGNOSIS — K31819 Angiodysplasia of stomach and duodenum without bleeding: Secondary | ICD-10-CM | POA: Insufficient documentation

## 2017-02-16 MED ORDER — SODIUM CHLORIDE 0.9 % IV SOLN
INTRAVENOUS | Status: DC
  Administered 2017-02-16: 14:00:00 via INTRAVENOUS

## 2017-02-16 MED ORDER — SODIUM CHLORIDE 0.9 % IV SOLN
750.0000 mg | Freq: Once | INTRAVENOUS | Status: AC
  Administered 2017-02-16: 750 mg via INTRAVENOUS
  Filled 2017-02-16: qty 15

## 2017-02-16 NOTE — Progress Notes (Signed)
Injectafer given as ordered. Patient tolerated it well, vitals stable and discharged home from clinic ambulatory.follow up as scheduled.

## 2017-02-16 NOTE — Patient Instructions (Signed)
[facility] Discharge Instructions  RECOMMENDATIONS MADE BY THE CONSULTANT AND ANY TEST RESULTS WILL BE SENT TO YOUR REFERRING PHYSICIAN.  Injectafer given  Follow up as scheduled  Thank you for choosing [facility] to provide your oncology and hematology care.  To afford each patient quality time with our provider, please arrive at least 15 minutes before your scheduled appointment time.    If you have a lab appointment with the Cancer Center please come in thru the  Main Entrance and check in at the main information desk  You need to re-schedule your appointment should you arrive 10 or more minutes late.  We strive to give you quality time with our providers, and arriving late affects you and other patients whose appointments are after yours.  Also, if you no show three or more times for appointments you may be dismissed from the clinic at the providers discretion.     Again, thank you for choosing [facility].  Our hope is that these requests will decrease the amount of time that you wait before being seen by our physicians.       _____________________________________________________________  Should you have questions after your visit to [facility], please contact our office at [phone] between the hours of 8:30 a.m. and 4:30 p.m.  Voicemails left after 4:30 p.m. will not be returned until the following business day.  For prescription refill requests, have your pharmacy contact our office.       Resources For Cancer Patients and their Caregivers ? American Cancer Society: Can assist with transportation, wigs, general needs, runs Look Good Feel Better.        1-888-227-6333 ? Cancer Care: Provides financial assistance, online support groups, medication/co-pay assistance.  1-800-813-HOPE (4673) ? [facility] Assists [place] cancer patients and their  families through emotional , educational and financial support.  336-427-4357 ? [place] DSS Where to apply for food stamps, Medicaid and utility assistance. 336-342-1394 ? [facility]: Transportation to medical appointments. 336-347-2287 ? Social Security Administration: May apply for disability if have a Stage IV cancer. 336-342-7796 1-800-772-1213 ? [place] Aging, Disability and Transit Services: Assists with nutrition, care and transit needs. 336-349-2343  Cancer Center Support Programs: @10RELATIVEDAYS@ > Cancer Support Group  2nd Tuesday of the month 1pm-2pm, Journey Room  > Creative Journey  3rd Tuesday of the month 1130am-1pm, Journey Room  > Look Good Feel Better  1st Wednesday of the month 10am-12 noon, Journey Room (Call American Cancer Society to register 1-800-395-5775)   

## 2017-02-22 DIAGNOSIS — E039 Hypothyroidism, unspecified: Secondary | ICD-10-CM | POA: Diagnosis not present

## 2017-02-22 DIAGNOSIS — I10 Essential (primary) hypertension: Secondary | ICD-10-CM | POA: Diagnosis not present

## 2017-02-27 DIAGNOSIS — R944 Abnormal results of kidney function studies: Secondary | ICD-10-CM | POA: Diagnosis not present

## 2017-02-27 DIAGNOSIS — I499 Cardiac arrhythmia, unspecified: Secondary | ICD-10-CM | POA: Diagnosis not present

## 2017-02-27 DIAGNOSIS — D509 Iron deficiency anemia, unspecified: Secondary | ICD-10-CM | POA: Diagnosis not present

## 2017-02-27 DIAGNOSIS — E039 Hypothyroidism, unspecified: Secondary | ICD-10-CM | POA: Diagnosis not present

## 2017-02-27 DIAGNOSIS — I10 Essential (primary) hypertension: Secondary | ICD-10-CM | POA: Diagnosis not present

## 2017-02-27 DIAGNOSIS — Z8719 Personal history of other diseases of the digestive system: Secondary | ICD-10-CM | POA: Diagnosis not present

## 2017-02-27 DIAGNOSIS — I270 Primary pulmonary hypertension: Secondary | ICD-10-CM | POA: Diagnosis not present

## 2017-03-09 ENCOUNTER — Ambulatory Visit (HOSPITAL_COMMUNITY): Payer: Medicare Other | Admitting: Adult Health

## 2017-03-09 ENCOUNTER — Other Ambulatory Visit (HOSPITAL_COMMUNITY): Payer: Medicare Other

## 2017-03-12 NOTE — Progress Notes (Signed)
[place]:  Medical Oncology/Hematology  PCP:  Wende Neighbors, MD [place] [id number]  REASON FOR VISIT:  Follow-up for iron deficiency anemia secondary to GAVE syndrome.   CURRENT THERAPY: IV Iron prn   HISTORY OF PRESENT ILLNESS: [name] 83 y.o. 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   INTERVAL HISTORY:  [name] presents for continued follow-up for severe iron deficiency anemia secondary to GI blood loss/GAVE (gastric antral vascular ectasia).   She is here today unaccompanied. She overall "feels okay."  She does not feel like her appetite is very good; she drinks about 1 can/day of Ensure to supplement her diet. Her weight is down about 4 lbs since late 11/2016 and down about 6 lbs since 10/2016. She doesn't think she has seen the dietitian in the past. Reports that her appetite is at 75%; energy levels are also at 75%.   Her feet/legs stay cold; she also has associated peripheral neuropathy to her toes. She has been referred to a vascular surgeon.   Denies any frank bleeding episodes; no blood in her stools, dark/tarry stools, hematuria, vaginal bleeding, nosebleeds, or gingival bleeding.  She last saw Dr. Laural Golden in 12/2016 when she had EGD. Her last IV iron infusion was 02/16/17 here at the cancer center. Endorses 1 dark stool after this infusion, but none since.  She has occasional constipation; she takes Miralax prn, but does not require it very often.   Otherwise, she is largely without complaints today.      REVIEW OF SYSTEMS:  Review of Systems  Constitutional: Positive for appetite change (appetite fair; 75% of baseline). Negative for fatigue and fever.  HENT:  Negative.   Eyes: Negative.   Respiratory: Negative for cough and shortness of breath.   Cardiovascular: Negative.  Negative for chest pain, leg  swelling and palpitations.  Gastrointestinal: Negative.  Negative for abdominal pain, blood in stool, constipation, diarrhea, nausea and vomiting.  Endocrine: Negative.   Genitourinary: Negative.  Negative for dysuria, hematuria and vaginal bleeding.   Musculoskeletal: Negative.   Skin: Negative.   Neurological:       Peripheral neuropathy to toes (chronic)   Hematological: Negative.  Does not bruise/bleed easily.  Psychiatric/Behavioral: Negative.      PAST MEDICAL/SURGICAL HISTORY:  Past Medical History:  Diagnosis Date  . Anemia   . Chronic bronchitis ([place])   . GAVE (gastric antral vascular ectasia) 09/12/12  . Hypertension   . Hypothyroidism   . Iron deficiency anemia 10/15/2012  . Iron deficiency anemia due to chronic blood loss 04/20/2014  . On home oxygen therapy    "2L/[place] at night" (11/07/2016)  . Palpitations   . Pneumonia 06/2016; 10/2016  . Syncope and collapse 11/06/2016   Past Surgical History:  Procedure Laterality Date  . BACK SURGERY    . BIOPSY  12/27/2016   Procedure: BIOPSY;  Surgeon: Rogene Houston, MD;  [facility];  Service: Endoscopy;;  gastric  . COLONOSCOPY  07/24/2012   Procedure: COLONOSCOPY;  Surgeon: Rogene Houston, MD;  [facility];  Service: Endoscopy;  Laterality: N/A;  200  . DILATION AND CURETTAGE OF UTERUS    . ESOPHAGOGASTRODUODENOSCOPY  09/12/2012   Procedure: ESOPHAGOGASTRODUODENOSCOPY (EGD);  Surgeon: Rogene Houston, MD;  [facility];  Service: Endoscopy;  Laterality: N/A;  325-changed to 200 per  Ann-pt moved up to 1030 Ann to notify pt  . ESOPHAGOGASTRODUODENOSCOPY  11/01/2012   Procedure: ESOPHAGOGASTRODUODENOSCOPY (EGD);  Surgeon: Rogene Houston, MD;  [facility];  Service: Endoscopy;  Laterality: N/A;  1:20  . ESOPHAGOGASTRODUODENOSCOPY N/A 07/04/2013   Procedure: ESOPHAGOGASTRODUODENOSCOPY (EGD);  Surgeon: Rogene Houston, MD;  [facility];  Service: Endoscopy;  Laterality:  N/A;  850  . ESOPHAGOGASTRODUODENOSCOPY N/A 08/06/2013   Procedure: ESOPHAGOGASTRODUODENOSCOPY (EGD);  Surgeon: Rogene Houston, MD;  [facility];  Service: Endoscopy;  Laterality: N/A;  1200  . ESOPHAGOGASTRODUODENOSCOPY N/A 10/31/2013   Procedure: ESOPHAGOGASTRODUODENOSCOPY (EGD);  Surgeon: Rogene Houston, MD;  [facility];  Service: Endoscopy;  Laterality: N/A;  125-moved to [place] notified pt  . ESOPHAGOGASTRODUODENOSCOPY N/A 10/23/2014   Procedure: ESOPHAGOGASTRODUODENOSCOPY (EGD);  Surgeon: Rogene Houston, MD;  [facility];  Service: Endoscopy;  Laterality: N/A;  1020  . ESOPHAGOGASTRODUODENOSCOPY N/A 10/11/2016   Procedure: ESOPHAGOGASTRODUODENOSCOPY (EGD);  Surgeon: Rogene Houston, MD;  [facility];  Service: Endoscopy;  Laterality: N/A;  245  . ESOPHAGOGASTRODUODENOSCOPY N/A 12/27/2016   Procedure: ESOPHAGOGASTRODUODENOSCOPY (EGD);  Surgeon: Rogene Houston, MD;  [facility];  Service: Endoscopy;  Laterality: N/A;  1225  . HEEL SPUR SURGERY Bilateral   . HOT HEMOSTASIS  11/01/2012   Procedure: HOT HEMOSTASIS (ARGON PLASMA COAGULATION/BICAP);  Surgeon: Rogene Houston, MD;  [facility];  Service: Endoscopy;  Laterality: N/A;  . HOT HEMOSTASIS N/A 07/04/2013   Procedure: HOT HEMOSTASIS (ARGON PLASMA COAGULATION/BICAP);  Surgeon: Rogene Houston, MD;  [facility];  Service: Endoscopy;  Laterality: N/A;  . HOT HEMOSTASIS N/A 08/06/2013   Procedure: HOT HEMOSTASIS (ARGON PLASMA COAGULATION/BICAP);  Surgeon: Rogene Houston, MD;  [facility];  Service: Endoscopy;  Laterality: N/A;  . HOT HEMOSTASIS N/A 10/31/2013   Procedure: HOT HEMOSTASIS (ARGON PLASMA COAGULATION/BICAP);  Surgeon: Rogene Houston, MD;  [facility];  Service: Endoscopy;  Laterality: N/A;  . HOT HEMOSTASIS N/A 10/23/2014   Procedure: HOT HEMOSTASIS (ARGON PLASMA COAGULATION/BICAP);  Surgeon: Rogene Houston, MD;   [facility];  Service: Endoscopy;  Laterality: N/A;  . HOT HEMOSTASIS N/A 10/11/2016   Procedure: HOT HEMOSTASIS (ARGON PLASMA COAGULATION/BICAP);  Surgeon: Rogene Houston, MD;  [facility];  Service: Endoscopy;  Laterality: N/A;  . JOINT REPLACEMENT    . KNEE ARTHROSCOPY Right    "before replacement"  . LUMBAR DISC SURGERY     "bone spur  . LUMBAR FUSION  06/25/2014   L4 & L5  . SHOULDER SURGERY Right    "spur"  . TONSILLECTOMY AND ADENOIDECTOMY    . TOTAL ABDOMINAL HYSTERECTOMY    . TOTAL KNEE ARTHROPLASTY Right   . US ECHOCARDIOGRAPHY  03/12/2006   EF 55-60%    ALLERGIES:  Allergies  Allergen Reactions  . Avapro [Irbesartan] Other (See Comments)    Cough   . Clarithromycin Other (See Comments)    Unknown   . Losartan Potassium-Hctz Other (See Comments)    Unknown  . Maxzide [Hydrochlorothiazide W-Triamterene] Other (See Comments)    Unknown   . Ziac [Bisoprolol-Hydrochlorothiazide]     Thinks caused itching and cough 08/12/12  . Penicillins Swelling and Rash    Has patient had a PCN reaction causing immediate rash, facial/tongue/throat swelling, SOB or lightheadedness with hypotension: no Has patient had a PCN reaction causing severe rash involving mucus membranes or skin  necrosis: no Has patient had a PCN reaction that required hospitalization {no Has patient had a PCN reaction occurring within the last 10 years: {no If all of the above answers are "NO", then may proceed with Cephalosporin use.  [facility] Cross Reactors Rash    CURRENT MEDICATIONS:  Outpatient Encounter Prescriptions as of 03/13/2017  Medication Sig  . albuterol (PROVENTIL) (2.5 MG/3ML) 0.083% nebulizer solution Take 2.5 mg by nebulization every 6 (six) hours as needed for wheezing or shortness of breath.  . Cholecalciferol (VITAMIN D) 2000 units CAPS Take 2,000 Units by mouth daily.  . digoxin (DIGOX) 0.125 MG tablet Take 1 tablet mouth daily except none on Sundays or as  directed  . feeding supplement, ENSURE ENLIVE, (ENSURE ENLIVE) LIQD Take 237 mLs by mouth 2 (two) times daily between meals.  . furosemide (LASIX) 20 MG tablet Take 20 mg by mouth daily as needed for fluid.  . hydrALAZINE (APRESOLINE) 10 MG tablet Take 1 tablet (10 mg total) by mouth 3 (three) times daily.  Marland Kitchen levothyroxine (SYNTHROID, LEVOTHROID) 112 MCG tablet Take 112 mcg by mouth daily before breakfast.  . methylPREDNISolone (MEDROL DOSEPAK) 4 MG TBPK tablet TK UTD  . metoprolol tartrate (LOPRESSOR) 25 MG tablet Take 1 tablet (25 mg total) by mouth daily.  . pantoprazole (PROTONIX) 40 MG tablet Take 1 tablet (40 mg total) by mouth daily.  . polyethylene glycol powder (GLYCOLAX/MIRALAX) powder Take 17 g by mouth daily as needed for moderate constipation.   . potassium chloride (K-DUR) 10 MEQ tablet Take 10 mEq by mouth daily as needed for fluid.  . [DISCONTINUED] Ascorbic Acid (VITAMIN C) 1000 MG tablet Take 1,000 mg by mouth 2 (two) times daily.  . [DISCONTINUED] b complex vitamins capsule Take 1 capsule by mouth daily.  . [DISCONTINUED] moxifloxacin (AVELOX) 400 MG tablet TK 1 T PO QD   No facility-administered encounter medications on file as of 03/13/2017.     SOCIAL HISTORY:  Social History   Social History  . Marital status: Married    Spouse name: N/A  . Number of children: N/A  . Years of education: N/A   Occupational History  . Not on file.   Social History Main Topics  . Smoking status: Never Smoker  . Smokeless tobacco: Never Used  . Alcohol use No  . Drug use: No  . Sexual activity: Not on file   Other Topics Concern  . Not on file   Social History Narrative  . No narrative on file    PHYSICAL EXAMINATION  ECOG PERFORMANCE STATUS: 1 - Symptomatic, but independent.   Vitals:   03/13/17 1017  BP: (!) 144/53  Pulse: 64  Resp: 18  Temp: 97.5 F (36.4 C)   Filed Weights   03/13/17 1017  Weight: 119 lb 9.6 oz (54.3 kg)    Physical Exam    Constitutional: She is oriented to person, place, and time and well-developed, well-nourished, and in no distress.  HENT:  Head: Normocephalic.  Mouth/Throat: Oropharynx is clear and moist. No oropharyngeal exudate.  Eyes: Conjunctivae are normal. Pupils are equal, round, and reactive to light. No scleral icterus.  Neck: Normal range of motion. Neck supple.  Cardiovascular: Normal rate, regular rhythm and normal heart sounds.   Pulmonary/Chest: Effort normal and breath sounds normal. No respiratory distress. She has no wheezes.  Abdominal: Soft. Bowel sounds are normal. There is no tenderness.  Musculoskeletal: Normal range of motion. She exhibits no edema.  Trace ankle edema bilaterally  Lymphadenopathy:    She has no cervical adenopathy.       Right: No supraclavicular adenopathy present.       Left: No supraclavicular adenopathy present.  Neurological: She is alert and oriented to person, place, and time. No cranial nerve deficit. Gait normal.  Skin: Skin is warm and dry. No rash noted.  Psychiatric: Mood, memory, affect and judgment normal.  Nursing note and vitals reviewed.    LABORATORY DATA: I have reviewed the data as listed. CBC    Component Value Date/Time   WBC 6.5 03/13/2017 0944   RBC 4.13 03/13/2017 0944   HGB 13.4 03/13/2017 0944   HCT 40.6 03/13/2017 0944   PLT 276 03/13/2017 0944   MCV 98.3 03/13/2017 0944   MCH 32.4 03/13/2017 0944   MCHC 33.0 03/13/2017 0944   RDW 16.2 (H) 03/13/2017 0944   LYMPHSABS 0.6 (L) 03/13/2017 0944   MONOABS 0.8 03/13/2017 0944   EOSABS 0.2 03/13/2017 0944   BASOSABS 0.0 03/13/2017 0944      Chemistry      Component Value Date/Time   NA 133 (L) 11/08/2016 0214   K 3.9 11/08/2016 0214   CL 102 11/08/2016 0214   CO2 25 11/08/2016 0214   BUN 14 11/08/2016 0214   CREATININE 0.85 11/08/2016 0214   CREATININE 1.07 (H) 09/16/2015 0855      Component Value Date/Time   CALCIUM 8.1 (L) 11/08/2016 0214   ALKPHOS 95 07/12/2016  1225   AST 29 07/12/2016 1225   ALT 23 07/12/2016 1225   BILITOT 0.5 07/12/2016 1225      Results for [name], [name] (MRN [id number])   Ref. Range 03/13/2017 09:45  Ferritin Latest Ref Range: 11 - 307 ng/mL 117     PENDING LABS:     DIAGNOSTIC IMAGING/PROCEDURES: Imaging results reviewed as listed.  Most recent EGD: 12/27/16 ([facility])    CXR: 12/22/16      ASSESSMENT AND PLAN:  Iron deficiency anemia, secondary to chronic GI related blood loss GAVE syndrome Weight loss, unintentional   Iron deficiency anemia secondary to chronic GI blood loss/GAVE syndrome:  -Her CBC is stable; hemoglobin 13.4.   -Ferritin pending; last IV iron with Injectafer given on 02/16/17. She tolerated well. We will contact her if she needs subsequent doses of IV iron. Ferritin goal >100 ng/mL.  -Labs monthly.  -Return to cancer center in 4 months for continued follow-up.   ADDENDUM:  Ferritin resulted after patient left clinic today and is 117 ng/mL. We will contact patient and let her know. No need for additional IV iron at this time.   Weight loss/Decreased appetite:  -Weight down about 4 lbs in past 4 months; down about 6 lbs in past 5 months.  -She is supplementing her intake with Ensure, generally 1 can/day. Encouraged her to increase to 2-3 cans/day if she was not going to eat much solid food.  Her teeth/dentures limit her ability to eat nuts. We discussed other high protein, high calorie foods for her try. Recommended she eat several small meals per day and supplement with Ensure more often as well.  -Referral to Nutrition placed today as well; will make arrangements to coordinate this visit with upcoming return visit to cancer center.      Dispo:  -Labs monthly; standing orders already in place for CBC with diff & ferritin.  -Referral to nutrition for weight loss/decreased appetite.  -Return to cancer center in 4 months for follow-up visit and labs (CBC with diff, CMET,  and ferritin).       All questions were answered. The patient knows to call the clinic with any problems, questions or concerns. We can certainly see the patient much sooner if necessary.  Plan of care discussed with Dr. Talbert Cage, who agrees with above aforementioned.   Ordered placed this encounter:  Orders Placed This Encounter  Procedures  . Amb Referral to Nutrition and Diabetic E     Mike Craze, NP [place] [phone]

## 2017-03-13 ENCOUNTER — Encounter (HOSPITAL_COMMUNITY): Payer: Medicare Other | Attending: Oncology

## 2017-03-13 ENCOUNTER — Encounter (HOSPITAL_BASED_OUTPATIENT_CLINIC_OR_DEPARTMENT_OTHER): Payer: Medicare Other | Admitting: Adult Health

## 2017-03-13 ENCOUNTER — Encounter (HOSPITAL_COMMUNITY): Payer: Self-pay | Admitting: Adult Health

## 2017-03-13 VITALS — BP 144/53 | HR 64 | Temp 97.5°F | Resp 18 | Ht 60.0 in | Wt 119.6 lb

## 2017-03-13 DIAGNOSIS — E039 Hypothyroidism, unspecified: Secondary | ICD-10-CM | POA: Diagnosis not present

## 2017-03-13 DIAGNOSIS — K31819 Angiodysplasia of stomach and duodenum without bleeding: Secondary | ICD-10-CM | POA: Diagnosis not present

## 2017-03-13 DIAGNOSIS — I10 Essential (primary) hypertension: Secondary | ICD-10-CM | POA: Insufficient documentation

## 2017-03-13 DIAGNOSIS — G629 Polyneuropathy, unspecified: Secondary | ICD-10-CM | POA: Diagnosis not present

## 2017-03-13 DIAGNOSIS — R634 Abnormal weight loss: Secondary | ICD-10-CM | POA: Diagnosis not present

## 2017-03-13 DIAGNOSIS — Z79899 Other long term (current) drug therapy: Secondary | ICD-10-CM | POA: Insufficient documentation

## 2017-03-13 DIAGNOSIS — D500 Iron deficiency anemia secondary to blood loss (chronic): Secondary | ICD-10-CM

## 2017-03-13 DIAGNOSIS — R630 Anorexia: Secondary | ICD-10-CM

## 2017-03-13 LAB — CBC WITH DIFFERENTIAL/PLATELET
BASOS ABS: 0 10*3/uL (ref 0.0–0.1)
Basophils Relative: 1 %
Eosinophils Absolute: 0.2 10*3/uL (ref 0.0–0.7)
Eosinophils Relative: 3 %
HEMATOCRIT: 40.6 % (ref 36.0–46.0)
Hemoglobin: 13.4 g/dL (ref 12.0–15.0)
LYMPHS PCT: 10 %
Lymphs Abs: 0.6 10*3/uL — ABNORMAL LOW (ref 0.7–4.0)
MCH: 32.4 pg (ref 26.0–34.0)
MCHC: 33 g/dL (ref 30.0–36.0)
MCV: 98.3 fL (ref 78.0–100.0)
MONO ABS: 0.8 10*3/uL (ref 0.1–1.0)
Monocytes Relative: 12 %
NEUTROS ABS: 4.9 10*3/uL (ref 1.7–7.7)
Neutrophils Relative %: 74 %
Platelets: 276 10*3/uL (ref 150–400)
RBC: 4.13 MIL/uL (ref 3.87–5.11)
RDW: 16.2 % — AB (ref 11.5–15.5)
WBC: 6.5 10*3/uL (ref 4.0–10.5)

## 2017-03-13 LAB — FERRITIN: Ferritin: 117 ng/mL (ref 11–307)

## 2017-03-13 NOTE — Patient Instructions (Addendum)
[place] at [facility] Discharge Instructions  RECOMMENDATIONS MADE BY THE CONSULTANT AND ANY TEST RESULTS WILL BE SENT TO YOUR REFERRING PHYSICIAN.  You were seen today by Mike Craze NP. Continue monthly labs. Return in 4 months for follow up.    Thank you for choosing [place] at [facility] to provide your oncology and hematology care.  To afford each patient quality time with our provider, please arrive at least 15 minutes before your scheduled appointment time.    If you have a lab appointment with the [facility] please come in thru the  Main Entrance and check in at the main information desk  You need to re-schedule your appointment should you arrive 10 or more minutes late.  We strive to give you quality time with our providers, and arriving late affects you and other patients whose appointments are after yours.  Also, if you no show three or more times for appointments you may be dismissed from the clinic at the providers discretion.     Again, thank you for choosing [facility].  Our hope is that these requests will decrease the amount of time that you wait before being seen by our physicians.       _____________________________________________________________  Should you have questions after your visit to [facility], please contact our office at [phone] between the hours of 8:30 a.m. and 4:30 p.m.  Voicemails left after 4:30 p.m. will not be returned until the following business day.  For prescription refill requests, have your pharmacy contact our office.       Resources For Cancer Patients and their Caregivers ? American Cancer Society: Can assist with transportation, wigs, general needs, runs Look Good Feel Better.        365-818-3932 ? Cancer Care: Provides financial assistance, online support groups, medication/co-pay assistance.  1-800-813-HOPE (364)415-4336) ? [place] Assists [place] cancer patients and their families through emotional , educational and financial support.  609-513-2780 ? [place] DSS Where to apply for food stamps, Medicaid and utility assistance. 786-644-9488 ? [facility]: Transportation to medical appointments. 901-834-6428 ? Social Security Administration: May apply for disability if have a Stage IV cancer. (938)238-6545 515-728-0334 ? [employer], Disability and Transit Services: Assists with nutrition, care and transit needs. [place] Support Programs: @10RELATIVEDAYS @ > Cancer Support Group  2nd Tuesday of the month 1pm-2pm, Journey Room  > Creative Journey  3rd Tuesday of the month 1130am-1pm, Journey Room  > Look Good Feel Better  1st Wednesday of the month 10am-12 noon, Journey Room (Call [place] to register [phone])

## 2017-03-14 ENCOUNTER — Other Ambulatory Visit: Payer: Self-pay | Admitting: *Deleted

## 2017-03-14 MED ORDER — METOPROLOL TARTRATE 25 MG PO TABS: 25.0000 mg | ORAL_TABLET | Freq: Every day | ORAL | 0 refills | Status: DC

## 2017-03-14 NOTE — Telephone Encounter (Signed)
REFILL 

## 2017-03-20 ENCOUNTER — Other Ambulatory Visit (HOSPITAL_COMMUNITY): Payer: Self-pay | Admitting: Neurosurgery

## 2017-03-20 DIAGNOSIS — M7071 Other bursitis of hip, right hip: Secondary | ICD-10-CM | POA: Diagnosis not present

## 2017-03-20 DIAGNOSIS — M4316 Spondylolisthesis, lumbar region: Secondary | ICD-10-CM | POA: Diagnosis not present

## 2017-03-20 DIAGNOSIS — M47816 Spondylosis without myelopathy or radiculopathy, lumbar region: Secondary | ICD-10-CM

## 2017-03-20 DIAGNOSIS — M4302 Spondylolysis, cervical region: Secondary | ICD-10-CM | POA: Diagnosis not present

## 2017-03-27 DIAGNOSIS — Z6822 Body mass index (BMI) 22.0-22.9, adult: Secondary | ICD-10-CM | POA: Diagnosis not present

## 2017-03-27 DIAGNOSIS — R601 Generalized edema: Secondary | ICD-10-CM | POA: Diagnosis not present

## 2017-04-05 ENCOUNTER — Ambulatory Visit (HOSPITAL_COMMUNITY)
Admission: RE | Admit: 2017-04-05 | Discharge: 2017-04-05 | Disposition: A | Discharge status: Home / Self Care | Payer: Medicare Other | Source: Ambulatory Visit | Attending: Neurosurgery | Admitting: Neurosurgery

## 2017-04-05 DIAGNOSIS — M48061 Spinal stenosis, lumbar region without neurogenic claudication: Secondary | ICD-10-CM | POA: Diagnosis not present

## 2017-04-05 DIAGNOSIS — I700 Atherosclerosis of aorta: Secondary | ICD-10-CM | POA: Insufficient documentation

## 2017-04-05 DIAGNOSIS — M47816 Spondylosis without myelopathy or radiculopathy, lumbar region: Secondary | ICD-10-CM | POA: Insufficient documentation

## 2017-04-12 ENCOUNTER — Encounter: Payer: Self-pay | Admitting: Advanced Practice Midwife

## 2017-04-12 ENCOUNTER — Ambulatory Visit (INDEPENDENT_AMBULATORY_CARE_PROVIDER_SITE_OTHER): Payer: Medicare Other | Admitting: Advanced Practice Midwife

## 2017-04-12 VITALS — BP 142/84 | HR 67 | Wt 119.0 lb

## 2017-04-12 DIAGNOSIS — B9689 Other specified bacterial agents as the cause of diseases classified elsewhere: Secondary | ICD-10-CM | POA: Diagnosis not present

## 2017-04-12 DIAGNOSIS — N760 Acute vaginitis: Secondary | ICD-10-CM | POA: Diagnosis not present

## 2017-04-12 DIAGNOSIS — N9089 Other specified noninflammatory disorders of vulva and perineum: Secondary | ICD-10-CM

## 2017-04-12 MED ORDER — LIDOCAINE 5 % EX OINT: 1.0000 "application " | TOPICAL_OINTMENT | CUTANEOUS | 0 refills | Status: DC | PRN

## 2017-04-12 MED ORDER — METRONIDAZOLE 500 MG PO TABS: 500.0000 mg | ORAL_TABLET | Freq: Two times a day (BID) | ORAL | 0 refills | Status: DC

## 2017-04-12 NOTE — Progress Notes (Signed)
[facility] Visit  Patient name: [name] MRN [id number]  Date of birth: October 17, 1933  CC & HPI:  [name] is a 83 y.o. Caucasian female presenting today for vuvlar pain/irritaion for a week.  Used 2 diflucans w/o any  Improvement,  Has been using clobetosol BID for a week, no better.  Urine causes terrible pain at perineum.  No odor/vagianl irritation other than external.   Pertinent History Reviewed:  Medical & Surgical Hx:   Past Medical History:  Diagnosis Date  . Anemia   . Chronic bronchitis ([place])   . GAVE (gastric antral vascular ectasia) 09/12/12  . Hypertension   . Hypothyroidism   . Iron deficiency anemia 10/15/2012  . Iron deficiency anemia due to chronic blood loss 04/20/2014  . On home oxygen therapy    "2L/[place] at night" (11/07/2016)  . Palpitations   . Pneumonia 06/2016; 10/2016  . Syncope and collapse 11/06/2016   Past Surgical History:  Procedure Laterality Date  . BACK SURGERY    . BIOPSY  12/27/2016   Procedure: BIOPSY;  Surgeon: Rogene Houston, MD;  [facility];  Service: Endoscopy;;  gastric  . COLONOSCOPY  07/24/2012   Procedure: COLONOSCOPY;  Surgeon: Rogene Houston, MD;  [facility];  Service: Endoscopy;  Laterality: N/A;  200  . DILATION AND CURETTAGE OF UTERUS    . ESOPHAGOGASTRODUODENOSCOPY  09/12/2012   Procedure: ESOPHAGOGASTRODUODENOSCOPY (EGD);  Surgeon: Rogene Houston, MD;  [facility];  Service: Endoscopy;  Laterality: N/A;  325-changed to 200 per Ann-pt moved up to [place] to notify pt  . ESOPHAGOGASTRODUODENOSCOPY  11/01/2012   Procedure: ESOPHAGOGASTRODUODENOSCOPY (EGD);  Surgeon: Rogene Houston, MD;  [facility];  Service: Endoscopy;  Laterality: N/A;  1:20  . ESOPHAGOGASTRODUODENOSCOPY N/A 07/04/2013   Procedure: ESOPHAGOGASTRODUODENOSCOPY (EGD);  Surgeon: Rogene Houston, MD;  [facility];  Service: Endoscopy;  Laterality: N/A;  850  . ESOPHAGOGASTRODUODENOSCOPY N/A  08/06/2013   Procedure: ESOPHAGOGASTRODUODENOSCOPY (EGD);  Surgeon: Rogene Houston, MD;  [facility];  Service: Endoscopy;  Laterality: N/A;  1200  . ESOPHAGOGASTRODUODENOSCOPY N/A 10/31/2013   Procedure: ESOPHAGOGASTRODUODENOSCOPY (EGD);  Surgeon: Rogene Houston, MD;  [facility];  Service: Endoscopy;  Laterality: N/A;  125-moved to [place] notified pt  . ESOPHAGOGASTRODUODENOSCOPY N/A 10/23/2014   Procedure: ESOPHAGOGASTRODUODENOSCOPY (EGD);  Surgeon: Rogene Houston, MD;  [facility];  Service: Endoscopy;  Laterality: N/A;  1020  . ESOPHAGOGASTRODUODENOSCOPY N/A 10/11/2016   Procedure: ESOPHAGOGASTRODUODENOSCOPY (EGD);  Surgeon: Rogene Houston, MD;  [facility];  Service: Endoscopy;  Laterality: N/A;  245  . ESOPHAGOGASTRODUODENOSCOPY N/A 12/27/2016   Procedure: ESOPHAGOGASTRODUODENOSCOPY (EGD);  Surgeon: Rogene Houston, MD;  [facility];  Service: Endoscopy;  Laterality: N/A;  1225  . HEEL SPUR SURGERY Bilateral   . HOT HEMOSTASIS  11/01/2012   Procedure: HOT HEMOSTASIS (ARGON PLASMA COAGULATION/BICAP);  Surgeon: Rogene Houston, MD;  [facility];  Service: Endoscopy;  Laterality: N/A;  . HOT HEMOSTASIS N/A 07/04/2013   Procedure: HOT HEMOSTASIS (ARGON PLASMA COAGULATION/BICAP);  Surgeon: Rogene Houston, MD;  [facility];  Service: Endoscopy;  Laterality: N/A;  . HOT HEMOSTASIS N/A 08/06/2013   Procedure: HOT HEMOSTASIS (ARGON PLASMA COAGULATION/BICAP);  Surgeon: Rogene Houston, MD;  [facility];  Service: Endoscopy;  Laterality: N/A;  . HOT HEMOSTASIS N/A 10/31/2013   Procedure: HOT HEMOSTASIS (  ARGON PLASMA COAGULATION/BICAP);  Surgeon: Rogene Houston, MD;  [facility];  Service: Endoscopy;  Laterality: N/A;  . HOT HEMOSTASIS N/A 10/23/2014   Procedure: HOT HEMOSTASIS (ARGON PLASMA COAGULATION/BICAP);  Surgeon: Rogene Houston, MD;  [facility];  Service: Endoscopy;   Laterality: N/A;  . HOT HEMOSTASIS N/A 10/11/2016   Procedure: HOT HEMOSTASIS (ARGON PLASMA COAGULATION/BICAP);  Surgeon: Rogene Houston, MD;  [facility];  Service: Endoscopy;  Laterality: N/A;  . JOINT REPLACEMENT    . KNEE ARTHROSCOPY Right    "before replacement"  . LUMBAR DISC SURGERY     "bone spur  . LUMBAR FUSION  06/25/2014   L4 & L5  . SHOULDER SURGERY Right    "spur"  . TONSILLECTOMY AND ADENOIDECTOMY    . TOTAL ABDOMINAL HYSTERECTOMY    . TOTAL KNEE ARTHROPLASTY Right   . US ECHOCARDIOGRAPHY  03/12/2006   EF 55-60%   Family History  Problem Relation Age of Onset  . Hypertension Mother   . Hypertension Father   . Heart attack Father   . Other Sister        aneurysm on heart  . Other Brother        aneurysm on heart    Current Outpatient Prescriptions:  .  albuterol (PROVENTIL) (2.5 MG/3ML) 0.083% nebulizer solution, Take 2.5 mg by nebulization every 6 (six) hours as needed for wheezing or shortness of breath., Disp: , Rfl:  .  digoxin (DIGOX) 0.125 MG tablet, Take 1 tablet mouth daily except none on Sundays or as directed, Disp: 30 tablet, Rfl: 5 .  feeding supplement, ENSURE ENLIVE, (ENSURE ENLIVE) LIQD, Take 237 mLs by mouth 2 (two) times daily between meals., Disp: , Rfl:  .  furosemide (LASIX) 20 MG tablet, Take 20 mg by mouth daily as needed for fluid., Disp: , Rfl:  .  hydrALAZINE (APRESOLINE) 10 MG tablet, Take 1 tablet (10 mg total) by mouth 3 (three) times daily., Disp: 270 tablet, Rfl: 3 .  levothyroxine (SYNTHROID, LEVOTHROID) 112 MCG tablet, Take 112 mcg by mouth daily before breakfast., Disp: , Rfl:  .  methylPREDNISolone (MEDROL DOSEPAK) 4 MG TBPK tablet, TK UTD, Disp: , Rfl: 0 .  metoprolol tartrate (LOPRESSOR) 25 MG tablet, Take 1 tablet (25 mg total) by mouth daily., Disp: 90 tablet, Rfl: 0 .  pantoprazole (PROTONIX) 40 MG tablet, Take 1 tablet (40 mg total) by mouth daily., Disp: 90 tablet, Rfl: 3 .  polyethylene glycol powder  (GLYCOLAX/MIRALAX) powder, Take 17 g by mouth daily as needed for moderate constipation. , Disp: , Rfl:  .  potassium chloride (K-DUR) 10 MEQ tablet, Take 10 mEq by mouth daily as needed for fluid., Disp: , Rfl:  .  lidocaine (XYLOCAINE) 5 % ointment, Apply 1 application topically as needed., Disp: 35.44 g, Rfl: 0 .  metroNIDAZOLE (FLAGYL) 500 MG tablet, Take 1 tablet (500 mg total) by mouth 2 (two) times daily., Disp: 14 tablet, Rfl: 0 Social History: Reviewed -  reports that she has never smoked. She has never used smokeless tobacco.  Review of Systems:   Constitutional: Negative for fever and chills Eyes: Negative for visual disturbances Respiratory: Negative for shortness of breath, dyspnea Cardiovascular: Negative for chest pain or palpitations  Gastrointestinal: Negative for vomiting, diarrhea and constipation; no abdominal pain Genitourinary: Negative for dysuria and urgency, vaginal irritation or itching Musculoskeletal: Negative for back pain, joint pain, myalgias  Neurological: Negative for dizziness and headaches    Objective Findings:  Physical Examination: General appearance - well appearing, and in no distress Mental status - alert, oriented to person, place, and time Chest:  Normal respiratory effort Heart - normal rate and regular rhythm Abdomen:  Soft, nontender Pelvic: red vulva/introitu/perineum.  Qtip for sample, mod clue cells  Musculoskeletal:  Normal range of motion without pain Extremities:  No edema    No results found for this or any previous visit (from the past 24 hour(s)).    Assessment & Plan:  A:   Painted w/gentian violet; rx flagyl/lidocaine cream.  Continue clobetosol BID. Call Tuesday if no improvement P:     Return for If you have any problems.  CRESENZO-DISHMAN,Marsena Taff CNM 04/12/2017 2:52 PM

## 2017-04-13 ENCOUNTER — Encounter (HOSPITAL_COMMUNITY): Payer: Medicare Other | Attending: Oncology

## 2017-04-13 DIAGNOSIS — K31819 Angiodysplasia of stomach and duodenum without bleeding: Secondary | ICD-10-CM | POA: Diagnosis not present

## 2017-04-13 DIAGNOSIS — R634 Abnormal weight loss: Secondary | ICD-10-CM | POA: Insufficient documentation

## 2017-04-13 DIAGNOSIS — Z6821 Body mass index (BMI) 21.0-21.9, adult: Secondary | ICD-10-CM | POA: Diagnosis not present

## 2017-04-13 DIAGNOSIS — D500 Iron deficiency anemia secondary to blood loss (chronic): Secondary | ICD-10-CM | POA: Insufficient documentation

## 2017-04-13 LAB — CBC WITH DIFFERENTIAL/PLATELET
Basophils Absolute: 0.1 10*3/uL (ref 0.0–0.1)
Basophils Relative: 1 %
EOS ABS: 0.2 10*3/uL (ref 0.0–0.7)
Eosinophils Relative: 3 %
HCT: 42.9 % (ref 36.0–46.0)
HEMOGLOBIN: 14.3 g/dL (ref 12.0–15.0)
LYMPHS PCT: 8 %
Lymphs Abs: 0.7 10*3/uL (ref 0.7–4.0)
MCH: 32.4 pg (ref 26.0–34.0)
MCHC: 33.3 g/dL (ref 30.0–36.0)
MCV: 97.1 fL (ref 78.0–100.0)
Monocytes Absolute: 1.4 10*3/uL — ABNORMAL HIGH (ref 0.1–1.0)
Monocytes Relative: 17 %
NEUTROS PCT: 71 %
Neutro Abs: 6.1 10*3/uL (ref 1.7–7.7)
Platelets: 337 10*3/uL (ref 150–400)
RBC: 4.42 MIL/uL (ref 3.87–5.11)
RDW: 13.8 % (ref 11.5–15.5)
WBC: 8.5 10*3/uL (ref 4.0–10.5)

## 2017-04-13 LAB — FERRITIN: FERRITIN: 46 ng/mL (ref 11–307)

## 2017-04-15 ENCOUNTER — Other Ambulatory Visit (HOSPITAL_COMMUNITY): Payer: Self-pay | Admitting: Adult Health

## 2017-04-16 ENCOUNTER — Encounter: Payer: Self-pay | Admitting: Vascular Surgery

## 2017-04-16 NOTE — Progress Notes (Signed)
Cardiology Office Note   Date:  04/18/2017   ID:  Mary, [name] 1933-05-08, MRN [id number]  PCP:  Celene Squibb, MD  Cardiologist: Peter Martinique MD  Chief Complaint  Patient presents with  . Follow-up    pt denied chest pain and SOB  . Hypertension      History of Present Illness: [name] is a 83 y.o. female who presents for follow up HTN, PACs, and syncope.   She is a retired Marine scientist.  She has a past history of essential hypertension and a history of frequent premature atrial beats. She had a nuclear stress test on 12/21/11. She was found to have no ischemia and her ejection fraction was 83% by Myoview.  Prior Echos have shown normal LV function with moderate pulmonary HTN.  She is still using oxygen 2 L per minute at night but does not have to use it during the day when she is more active. She has a history of GAVE. She had an endoscopy in December 2015. Her Hgb has been maintained with periodic iron infusions.   On October 11, 2016 she had upper EGD showing severe GAVE and she had coagulation with argon plasma for bleeding prophylaxis.  She was admitted in December 2017 with witnessed syncope x 2. Etiology unclear. She states it was because she was sick and was on antibiotics for PNA. Noted PACs on monitor. Echo and carotid dopplers unremarkable.   Outpatient event monitor was normal.  On follow up today she states she has no further syncope or dizziness. Hgb has been stable. She did get upset stomach after being on Flagyl for vaginitis. She is finding it more difficult to care for her debilitated husband and their cattle farm. She denies any chest pain or increased SOB.   Past Medical History:  Diagnosis Date  . Anemia   . Chronic bronchitis ([place])   . GAVE (gastric antral vascular ectasia) 09/12/12  . Hypertension   . Hypothyroidism   . Iron deficiency anemia 10/15/2012  . Iron deficiency anemia due to chronic blood loss 04/20/2014  . On home oxygen therapy    "2L/[place] at  night" (11/07/2016)  . Palpitations   . Pneumonia 06/2016; 10/2016  . Syncope and collapse 11/06/2016    Past Surgical History:  Procedure Laterality Date  . BACK SURGERY    . BIOPSY  12/27/2016   Procedure: BIOPSY;  Surgeon: Rogene Houston, MD;  [facility];  Service: Endoscopy;;  gastric  . COLONOSCOPY  07/24/2012   Procedure: COLONOSCOPY;  Surgeon: Rogene Houston, MD;  [facility];  Service: Endoscopy;  Laterality: N/A;  200  . DILATION AND CURETTAGE OF UTERUS    . ESOPHAGOGASTRODUODENOSCOPY  09/12/2012   Procedure: ESOPHAGOGASTRODUODENOSCOPY (EGD);  Surgeon: Rogene Houston, MD;  [facility];  Service: Endoscopy;  Laterality: N/A;  325-changed to 200 per Ann-pt moved up to [place] to notify pt  . ESOPHAGOGASTRODUODENOSCOPY  11/01/2012   Procedure: ESOPHAGOGASTRODUODENOSCOPY (EGD);  Surgeon: Rogene Houston, MD;  [facility];  Service: Endoscopy;  Laterality: N/A;  1:20  . ESOPHAGOGASTRODUODENOSCOPY N/A 07/04/2013   Procedure: ESOPHAGOGASTRODUODENOSCOPY (EGD);  Surgeon: Rogene Houston, MD;  [facility];  Service: Endoscopy;  Laterality: N/A;  850  . ESOPHAGOGASTRODUODENOSCOPY N/A 08/06/2013   Procedure: ESOPHAGOGASTRODUODENOSCOPY (EGD);  Surgeon: Rogene Houston, MD;  [facility];  Service: Endoscopy;  Laterality: N/A;  1200  . ESOPHAGOGASTRODUODENOSCOPY N/A 10/31/2013  Procedure: ESOPHAGOGASTRODUODENOSCOPY (EGD);  Surgeon: Rogene Houston, MD;  [facility];  Service: Endoscopy;  Laterality: N/A;  125-moved to [place] notified pt  . ESOPHAGOGASTRODUODENOSCOPY N/A 10/23/2014   Procedure: ESOPHAGOGASTRODUODENOSCOPY (EGD);  Surgeon: Rogene Houston, MD;  [facility];  Service: Endoscopy;  Laterality: N/A;  1020  . ESOPHAGOGASTRODUODENOSCOPY N/A 10/11/2016   Procedure: ESOPHAGOGASTRODUODENOSCOPY (EGD);  Surgeon: Rogene Houston, MD;  [facility];  Service: Endoscopy;  Laterality:  N/A;  245  . ESOPHAGOGASTRODUODENOSCOPY N/A 12/27/2016   Procedure: ESOPHAGOGASTRODUODENOSCOPY (EGD);  Surgeon: Rogene Houston, MD;  [facility];  Service: Endoscopy;  Laterality: N/A;  1225  . HEEL SPUR SURGERY Bilateral   . HOT HEMOSTASIS  11/01/2012   Procedure: HOT HEMOSTASIS (ARGON PLASMA COAGULATION/BICAP);  Surgeon: Rogene Houston, MD;  [facility];  Service: Endoscopy;  Laterality: N/A;  . HOT HEMOSTASIS N/A 07/04/2013   Procedure: HOT HEMOSTASIS (ARGON PLASMA COAGULATION/BICAP);  Surgeon: Rogene Houston, MD;  [facility];  Service: Endoscopy;  Laterality: N/A;  . HOT HEMOSTASIS N/A 08/06/2013   Procedure: HOT HEMOSTASIS (ARGON PLASMA COAGULATION/BICAP);  Surgeon: Rogene Houston, MD;  [facility];  Service: Endoscopy;  Laterality: N/A;  . HOT HEMOSTASIS N/A 10/31/2013   Procedure: HOT HEMOSTASIS (ARGON PLASMA COAGULATION/BICAP);  Surgeon: Rogene Houston, MD;  [facility];  Service: Endoscopy;  Laterality: N/A;  . HOT HEMOSTASIS N/A 10/23/2014   Procedure: HOT HEMOSTASIS (ARGON PLASMA COAGULATION/BICAP);  Surgeon: Rogene Houston, MD;  [facility];  Service: Endoscopy;  Laterality: N/A;  . HOT HEMOSTASIS N/A 10/11/2016   Procedure: HOT HEMOSTASIS (ARGON PLASMA COAGULATION/BICAP);  Surgeon: Rogene Houston, MD;  [facility];  Service: Endoscopy;  Laterality: N/A;  . JOINT REPLACEMENT    . KNEE ARTHROSCOPY Right    "before replacement"  . LUMBAR DISC SURGERY     "bone spur  . LUMBAR FUSION  06/25/2014   L4 & L5  . SHOULDER SURGERY Right    "spur"  . TONSILLECTOMY AND ADENOIDECTOMY    . TOTAL ABDOMINAL HYSTERECTOMY    . TOTAL KNEE ARTHROPLASTY Right   . US ECHOCARDIOGRAPHY  03/12/2006   EF 55-60%     Current Outpatient Prescriptions  Medication Sig Dispense Refill  . albuterol (PROVENTIL) (2.5 MG/3ML) 0.083% nebulizer solution Take 2.5 mg by nebulization every 6 (six) hours as needed for  wheezing or shortness of breath.    . clindamycin (CLEOCIN) 300 MG capsule Take 1 capsule (300 mg total) by mouth 3 (three) times daily. 21 capsule 0  . digoxin (DIGOX) 0.125 MG tablet Take 1 tablet mouth daily except none on Sundays or as directed 30 tablet 5  . feeding supplement, ENSURE ENLIVE, (ENSURE ENLIVE) LIQD Take 237 mLs by mouth 2 (two) times daily between meals.    . furosemide (LASIX) 20 MG tablet Take 20 mg by mouth daily as needed for fluid.    . hydrALAZINE (APRESOLINE) 10 MG tablet Take 1 tablet (10 mg total) by mouth 3 (three) times daily. 270 tablet 3  . levothyroxine (SYNTHROID, LEVOTHROID) 112 MCG tablet Take 112 mcg by mouth daily before breakfast.    . lidocaine (XYLOCAINE) 5 % ointment Apply 1 application topically as needed. 35.44 g 0  . metoprolol tartrate (LOPRESSOR) 25 MG tablet Take 1 tablet (25 mg total) by mouth daily. 90 tablet 0  . metroNIDAZOLE (METROGEL VAGINAL) 0.75 % vaginal gel Place 1 Applicatorful  vaginally at bedtime. For 5 nights 70 g 0  . polyethylene glycol powder (GLYCOLAX/MIRALAX) powder Take 17 g by mouth daily as needed for moderate constipation.     . potassium chloride (K-DUR) 10 MEQ tablet Take 10 mEq by mouth daily as needed for fluid.     No current facility-administered medications for this visit.     Allergies:   Flagyl [metronidazole]; Avapro [irbesartan]; Clarithromycin; Losartan potassium-hctz; Maxzide [hydrochlorothiazide w-triamterene]; Ziac [bisoprolol-hydrochlorothiazide]; Penicillins; and Sulfa drugs cross reactors    Social History:  The patient  reports that she has never smoked. She has never used smokeless tobacco. She reports that she does not drink alcohol or use drugs.   Family History:  The patient's family history includes Heart attack in her father; Hypertension in her father and mother; Other in her brother and sister.    ROS:  Please see the history of present illness.   Otherwise, review of systems are positive for  none.   All other systems are reviewed and negative.    PHYSICAL EXAM: VS:  BP 132/65   Pulse 72   Ht 5\' 1"  (1.549 m)   Wt 118 lb (53.5 kg)   BMI 22.30 kg/m  , BMI Body mass index is 22.3 kg/m. GEN: Well nourished, well developed, in no acute distress  HEENT: normal  Neck: no JVD, carotid bruits, or masses Cardiac: RRR; no murmurs, rubs, or gallops,no edema.  Respiratory:  clear to auscultation bilaterally, normal work of breathing GI: soft, nontender, nondistended, + BS MS: no deformity or atrophy  Skin: warm and dry, no rash. She has puffy lower extremities but no pitting edema.  Neuro:  Strength and sensation are intact Psych: euthymic mood, full affect   EKG:  EKG is not  ordered today.    Recent Labs: 06/08/2016: TSH 0.863 07/12/2016: ALT 23 11/07/2016: B Natriuretic Peptide 198.8 11/08/2016: BUN 14; Creatinine, Ser 0.85; Potassium 3.9; Sodium 133 04/13/2017: Hemoglobin 14.3; Platelets 337    Lipid Panel    Component Value Date/Time   CHOL 146 09/16/2015 0855   TRIG 106 09/16/2015 0855   HDL 48 09/16/2015 0855   CHOLHDL 3.0 09/16/2015 0855   VLDL 21 09/16/2015 0855   LDLCALC 77 09/16/2015 0855      Wt Readings from Last 3 Encounters:  04/18/17 118 lb (53.5 kg)  04/17/17 118 lb 9.6 oz (53.8 kg)  04/12/17 119 lb (54 kg)     Labs reviewed from 11/27/15: Hgb 11.0. TSH normal. Cholesterol 130, trig-92, HDL 48, LDL 64. Dig level low in October2016.  Dated 07/12/16: BUN 19, creatinine 1.09. Other CMET normal 06/08/16: TSH is normal.  Echo 11/08/16: Study Conclusions  - Left ventricle: The cavity size was normal. There was mild   concentric hypertrophy. Systolic function was normal. The   estimated ejection fraction was in the range of 60% to 65%. Wall   motion was normal; there were no regional wall motion   abnormalities. Features are consistent with a pseudonormal left   ventricular filling pattern, with concomitant abnormal relaxation   and increased filling  pressure (grade 2 diastolic dysfunction).   Doppler parameters are consistent with elevated ventricular   end-diastolic filling pressure. - Aortic valve: There was mild stenosis. There was moderate   regurgitation. Mean gradient (S): 11 mm Hg. Valve area (VTI):   1.74 cm^2. Valve area (Vmax): 2.13 cm^2. Valve area (Vmean): 1.79   cm^2. - Aortic root: The aortic root was normal in size. - Ascending aorta: The ascending  aorta was normal in size. - Mitral valve: Calcified annulus. Mildly thickened leaflets .   There was mild regurgitation. - Left atrium: The atrium was normal in size. - Right ventricle: The cavity size was normal. Wall thickness was   normal. Systolic function was normal. - Right atrium: The atrium was mildly dilated. - Tricuspid valve: There was mild regurgitation. - Pulmonic valve: There was trivial regurgitation. - Pulmonary arteries: Systolic pressure was mildly increased. PA   peak pressure: 32 mm Hg (S). - Inferior vena cava: The vessel was normal in size. - Pericardium, extracardiac: There was no pericardial effusion.  Event monitor Jan 2018: normal.  ASSESSMENT AND PLAN:   1. Benign hypertensive heart disease without heart failure. BP is well controlled.  2. GAVE (gastric antral vascular ectasia)- serial EGDs. No gross bleeding. Hgb stable. Getting periodic iron.  3. Chronic iron deficiency anemia secondary to gastric antral vascular ectasia. On periodic iron infusions.  4. Pulmonary hypertension- per pulmonary  5. History of  PACs, well controlled with Dig and metoprolol.  6. Syncope- resolved. No cardiac etiology seen.   Plan: Continue on same medication. Follow up 6 months.  Current medicines are reviewed at length with the patient today.  The patient does not have concerns regarding medicines.  The following changes have been made:  no change  Labs/ tests ordered today include: none  No orders of the defined types were placed in this  encounter.     Signed, Peter Martinique MD, [facility]    04/18/2017 7:48 PM    [place]

## 2017-04-17 ENCOUNTER — Ambulatory Visit (INDEPENDENT_AMBULATORY_CARE_PROVIDER_SITE_OTHER): Payer: Medicare Other | Admitting: Internal Medicine

## 2017-04-17 ENCOUNTER — Encounter (INDEPENDENT_AMBULATORY_CARE_PROVIDER_SITE_OTHER): Payer: Self-pay | Admitting: Internal Medicine

## 2017-04-17 ENCOUNTER — Other Ambulatory Visit: Payer: Self-pay | Admitting: Advanced Practice Midwife

## 2017-04-17 VITALS — BP 122/68 | HR 62 | Temp 97.0°F | Resp 18 | Ht 61.0 in | Wt 118.6 lb

## 2017-04-17 DIAGNOSIS — R634 Abnormal weight loss: Secondary | ICD-10-CM | POA: Diagnosis not present

## 2017-04-17 DIAGNOSIS — K31819 Angiodysplasia of stomach and duodenum without bleeding: Secondary | ICD-10-CM | POA: Diagnosis not present

## 2017-04-17 MED ORDER — CLINDAMYCIN HCL 300 MG PO CAPS: 300.0000 mg | ORAL_CAPSULE | Freq: Three times a day (TID) | ORAL | 0 refills | Status: DC

## 2017-04-17 MED ORDER — CLINDAMYCIN PHOSPHATE 100 MG VA SUPP: 100.0000 mg | Freq: Every day | VAGINAL | 0 refills | Status: DC

## 2017-04-17 NOTE — Progress Notes (Unsigned)
Clindamycin oral casue medicare denited vagianl

## 2017-04-17 NOTE — Progress Notes (Signed)
Flagyl tore her stomach up.  Getting better.  Cleocin ovules sent

## 2017-04-17 NOTE — Patient Instructions (Signed)
Weight check in 2 months. Call if you have melena or tarry stool.

## 2017-04-17 NOTE — Progress Notes (Signed)
Presenting complaint;  Follow-up for iron deficiency anemia.  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 Colonoscopy was unremarkable but she was found have gastric antral vascular ectasia and has undergone multiple APC sessions. Last treatment was in February 2018. Patient states she has not experienced melena since her last treatment. Recent hemoglobin was normal. She has received periodic iron infusions and she will get another one later this month because of ferritin is coming down. She denies rectal bleeding. He continues to complain of poor appetite. She does not have nausea or vomiting. She wonders if her dentures have anything to do with her poor intake. She is drinking 2 cans of and show daily. She has lost 14 pounds in the last 10 months. She does not take OTC NSAIDs.   Current Medications: Outpatient Encounter Prescriptions as of 04/17/2017  Medication Sig  . albuterol (PROVENTIL) (2.5 MG/3ML) 0.083% nebulizer solution Take 2.5 mg by nebulization every 6 (six) hours as needed for wheezing or shortness of breath.  . clindamycin (CLEOCIN) 100 MG vaginal suppository Place 1 suppository (100 mg total) vaginally at bedtime.  . digoxin (DIGOX) 0.125 MG tablet Take 1 tablet mouth daily except none on Sundays or as directed  . feeding supplement, ENSURE ENLIVE, (ENSURE ENLIVE) LIQD Take 237 mLs by mouth 2 (two) times daily between meals.  . furosemide (LASIX) 20 MG tablet Take 20 mg by mouth daily as needed for fluid.  . hydrALAZINE (APRESOLINE) 10 MG tablet Take 1 tablet (10 mg total) by mouth 3 (three) times daily.  Marland Kitchen levothyroxine (SYNTHROID, LEVOTHROID) 112 MCG tablet Take 112 mcg by mouth daily before breakfast.  . lidocaine (XYLOCAINE) 5 % ointment Apply 1 application topically as needed.  . metoprolol tartrate (LOPRESSOR) 25 MG tablet Take 1 tablet (25 mg total) by mouth daily.  .  pantoprazole (PROTONIX) 40 MG tablet Take 1 tablet (40 mg total) by mouth daily.  . polyethylene glycol powder (GLYCOLAX/MIRALAX) powder Take 17 g by mouth daily as needed for moderate constipation.   . potassium chloride (K-DUR) 10 MEQ tablet Take 10 mEq by mouth daily as needed for fluid.  . [DISCONTINUED] methylPREDNISolone (MEDROL DOSEPAK) 4 MG TBPK tablet TK UTD  . [DISCONTINUED] metroNIDAZOLE (FLAGYL) 500 MG tablet Take 1 tablet (500 mg total) by mouth 2 (two) times daily.   No facility-administered encounter medications on file as of 04/17/2017.      Objective: Blood pressure 122/68, pulse 62, temperature 97 F (36.1 C), temperature source Oral, resp. rate 18, height 5\' 1"  (1.549 m), weight 118 lb 9.6 oz (53.8 kg). Patient is alert and in no acute distress. Conjunctiva is pink. Sclera is nonicteric Oropharyngeal mucosa is normal. No neck masses or thyromegaly noted. Cardiac exam with regular rhythm normal S1 and split S2. She has faint systolic ejection murmur best heard at left sternal border. Lungs are clear to auscultation. Abdomen is symmetrical soft and nontender without organomegaly or masses. No LE edema or clubbing noted.  Labs/studies Results: Lab data from 04/13/2017  H&H is 14.3 and 42.9  Serum ferritin was 46. It was 117 one month earlier.  Assessment:  #1. Iron deficiency anemia secondary to gastric antral vascular ectasia for which she has undergone multiple APC ablation with modest results. At least she is not requiring transfusions. Recent H&H was normal. Since ferritin is trending down and she will be receiving iron infusion supervised by hematology oncology clinic. #2.  Weight loss. She has lost 10 pounds in the last 10 months. Clearly weight loss is secondary to poor oral intake.   Plan:  Patient encouraged to eat snacks between meals. Weight check in 2 months. Will confer with Dr. [name] if she would benefit from Megace or similar medications. Office  visit in next months.

## 2017-04-18 ENCOUNTER — Other Ambulatory Visit: Payer: Self-pay | Admitting: Advanced Practice Midwife

## 2017-04-18 ENCOUNTER — Telehealth: Payer: Self-pay | Admitting: *Deleted

## 2017-04-18 ENCOUNTER — Ambulatory Visit (INDEPENDENT_AMBULATORY_CARE_PROVIDER_SITE_OTHER): Payer: Medicare Other | Admitting: Cardiology

## 2017-04-18 VITALS — BP 132/65 | HR 72 | Ht 61.0 in | Wt 118.0 lb

## 2017-04-18 DIAGNOSIS — I491 Atrial premature depolarization: Secondary | ICD-10-CM

## 2017-04-18 DIAGNOSIS — I119 Hypertensive heart disease without heart failure: Secondary | ICD-10-CM | POA: Diagnosis not present

## 2017-04-18 DIAGNOSIS — R55 Syncope and collapse: Secondary | ICD-10-CM | POA: Diagnosis not present

## 2017-04-18 MED ORDER — METRONIDAZOLE 0.75 % VA GEL: 1.0000 | Freq: Every day | VAGINAL | 0 refills | Status: DC

## 2017-04-18 NOTE — Progress Notes (Signed)
Cleocin ovules not covered by insurance. Rx for both oral clindamycin and vaginal metronidazole.  Pt may choose which med she prefers (doesn't need both).

## 2017-04-18 NOTE — Telephone Encounter (Signed)
LMOM that cleocin ovules were not covered by insurance and that Rx for clindamycin and vaginal metronidazole had been sent to pharmacy. Advised that she only needed to take one of those, whichever was her preference.

## 2017-04-18 NOTE — Patient Instructions (Addendum)
Continue your current therapy  I will see you in one year   

## 2017-04-19 ENCOUNTER — Ambulatory Visit (HOSPITAL_COMMUNITY)
Admission: RE | Admit: 2017-04-19 | Discharge: 2017-04-19 | Disposition: A | Discharge status: Home / Self Care | Payer: Medicare Other | Source: Ambulatory Visit | Attending: Vascular Surgery | Admitting: Vascular Surgery

## 2017-04-19 ENCOUNTER — Ambulatory Visit (INDEPENDENT_AMBULATORY_CARE_PROVIDER_SITE_OTHER): Payer: Medicare Other | Admitting: Vascular Surgery

## 2017-04-19 ENCOUNTER — Telehealth: Payer: Self-pay | Admitting: *Deleted

## 2017-04-19 ENCOUNTER — Encounter: Payer: Self-pay | Admitting: Vascular Surgery

## 2017-04-19 VITALS — BP 161/74 | HR 67 | Temp 97.5°F | Resp 14 | Ht 61.0 in | Wt 115.0 lb

## 2017-04-19 DIAGNOSIS — G609 Hereditary and idiopathic neuropathy, unspecified: Secondary | ICD-10-CM

## 2017-04-19 DIAGNOSIS — I83813 Varicose veins of bilateral lower extremities with pain: Secondary | ICD-10-CM

## 2017-04-19 MED ORDER — GABAPENTIN 100 MG PO CAPS: 100.0000 mg | ORAL_CAPSULE | Freq: Every day | ORAL | 3 refills | Status: DC

## 2017-04-19 NOTE — Progress Notes (Signed)
Referring Physician: Dr Doran Durand  Patient name: [name] MRN: [id number] DOB: 02-May-1933 Sex: female  REASON FOR CONSULT: Leg swelling with coolness and numbness in feet  HPI: [name] is a 83 y.o. female, with a several year history of coolness in the foot and toes left greater than right. She also has some numbness in the toes. She was seen recently by Dr. Doran Durand and thought that this might be either related to venous pathology or neuropathy. The patient denies any history of diabetes or chronic alcohol use. She was tested by Dr. Verita Schneiders in the past for vitamin B12 deficiency and this was negative. She does not describe claudication or rest pain symptoms. She has no nonhealing wounds. She has had back surgery in the past. She apparently was placed on low-dose Neurontin in the past after this. Other medical problems include anemia, hypertension both of which are stable.  Past Medical History:  Diagnosis Date  . Anemia   . Chronic bronchitis ([place])   . GAVE (gastric antral vascular ectasia) 09/12/12  . Hypertension   . Hypothyroidism   . Iron deficiency anemia 10/15/2012  . Iron deficiency anemia due to chronic blood loss 04/20/2014  . On home oxygen therapy    "2L/[place] at night" (11/07/2016)  . Palpitations   . Pneumonia 06/2016; 10/2016  . Syncope and collapse 11/06/2016   Past Surgical History:  Procedure Laterality Date  . BACK SURGERY    . BIOPSY  12/27/2016   Procedure: BIOPSY;  Surgeon: Rogene Houston, MD;  [facility];  Service: Endoscopy;;  gastric  . COLONOSCOPY  07/24/2012   Procedure: COLONOSCOPY;  Surgeon: Rogene Houston, MD;  [facility];  Service: Endoscopy;  Laterality: N/A;  200  . DILATION AND CURETTAGE OF UTERUS    . ESOPHAGOGASTRODUODENOSCOPY  09/12/2012   Procedure: ESOPHAGOGASTRODUODENOSCOPY (EGD);  Surgeon: Rogene Houston, MD;  [facility];  Service: Endoscopy;  Laterality: N/A;  325-changed to 200 per Ann-pt moved up to  [place] to notify pt  . ESOPHAGOGASTRODUODENOSCOPY  11/01/2012   Procedure: ESOPHAGOGASTRODUODENOSCOPY (EGD);  Surgeon: Rogene Houston, MD;  [facility];  Service: Endoscopy;  Laterality: N/A;  1:20  . ESOPHAGOGASTRODUODENOSCOPY N/A 07/04/2013   Procedure: ESOPHAGOGASTRODUODENOSCOPY (EGD);  Surgeon: Rogene Houston, MD;  [facility];  Service: Endoscopy;  Laterality: N/A;  850  . ESOPHAGOGASTRODUODENOSCOPY N/A 08/06/2013   Procedure: ESOPHAGOGASTRODUODENOSCOPY (EGD);  Surgeon: Rogene Houston, MD;  [facility];  Service: Endoscopy;  Laterality: N/A;  1200  . ESOPHAGOGASTRODUODENOSCOPY N/A 10/31/2013   Procedure: ESOPHAGOGASTRODUODENOSCOPY (EGD);  Surgeon: Rogene Houston, MD;  [facility];  Service: Endoscopy;  Laterality: N/A;  125-moved to [place] notified pt  . ESOPHAGOGASTRODUODENOSCOPY N/A 10/23/2014   Procedure: ESOPHAGOGASTRODUODENOSCOPY (EGD);  Surgeon: Rogene Houston, MD;  [facility];  Service: Endoscopy;  Laterality: N/A;  1020  . ESOPHAGOGASTRODUODENOSCOPY N/A 10/11/2016   Procedure: ESOPHAGOGASTRODUODENOSCOPY (EGD);  Surgeon: Rogene Houston, MD;  [facility];  Service: Endoscopy;  Laterality: N/A;  245  . ESOPHAGOGASTRODUODENOSCOPY N/A 12/27/2016   Procedure: ESOPHAGOGASTRODUODENOSCOPY (EGD);  Surgeon: Rogene Houston, MD;  [facility];  Service: Endoscopy;  Laterality: N/A;  1225  . HEEL SPUR SURGERY Bilateral   . HOT HEMOSTASIS  11/01/2012   Procedure: HOT HEMOSTASIS (ARGON PLASMA COAGULATION/BICAP);  Surgeon: Rogene Houston, MD;  [facility];  Service: Endoscopy;  Laterality: N/A;  .  HOT HEMOSTASIS N/A 07/04/2013   Procedure: HOT HEMOSTASIS (ARGON PLASMA COAGULATION/BICAP);  Surgeon: Rogene Houston, MD;  [facility];  Service: Endoscopy;  Laterality: N/A;  . HOT HEMOSTASIS N/A 08/06/2013   Procedure: HOT HEMOSTASIS (ARGON PLASMA COAGULATION/BICAP);  Surgeon: Rogene Houston, MD;  [facility];  Service: Endoscopy;  Laterality: N/A;  . HOT HEMOSTASIS N/A 10/31/2013   Procedure: HOT HEMOSTASIS (ARGON PLASMA COAGULATION/BICAP);  Surgeon: Rogene Houston, MD;  [facility];  Service: Endoscopy;  Laterality: N/A;  . HOT HEMOSTASIS N/A 10/23/2014   Procedure: HOT HEMOSTASIS (ARGON PLASMA COAGULATION/BICAP);  Surgeon: Rogene Houston, MD;  [facility];  Service: Endoscopy;  Laterality: N/A;  . HOT HEMOSTASIS N/A 10/11/2016   Procedure: HOT HEMOSTASIS (ARGON PLASMA COAGULATION/BICAP);  Surgeon: Rogene Houston, MD;  [facility];  Service: Endoscopy;  Laterality: N/A;  . JOINT REPLACEMENT    . KNEE ARTHROSCOPY Right    "before replacement"  . LUMBAR DISC SURGERY     "bone spur  . LUMBAR FUSION  06/25/2014   L4 & L5  . SHOULDER SURGERY Right    "spur"  . TONSILLECTOMY AND ADENOIDECTOMY    . TOTAL ABDOMINAL HYSTERECTOMY    . TOTAL KNEE ARTHROPLASTY Right   . US ECHOCARDIOGRAPHY  03/12/2006   EF 55-60%    Family History  Problem Relation Age of Onset  . Hypertension Mother   . Hypertension Father   . Heart attack Father   . Other Sister        aneurysm on heart  . Other Brother        aneurysm on heart    SOCIAL HISTORY: Social History   Social History  . Marital status: Married    Spouse name: N/A  . Number of children: N/A  . Years of education: N/A   Occupational History  . Not on file.   Social History Main Topics  . Smoking status: Never Smoker  . Smokeless tobacco: Never Used  . Alcohol use No  . Drug use: No  . Sexual activity: No   Other Topics Concern  . Not on file   Social History Narrative  . No narrative on file    Allergies  Allergen Reactions  . Flagyl [Metronidazole] Diarrhea, Nausea Only and Other (See Comments)    Dizziness and feeling of inbalance  . Avapro [Irbesartan] Other (See Comments)    Cough   . Clarithromycin Other (See Comments)    Unknown   .  Losartan Potassium-Hctz Other (See Comments)    Unknown  . Maxzide [Hydrochlorothiazide W-Triamterene] Other (See Comments)    Unknown   . Ziac [Bisoprolol-Hydrochlorothiazide]     Thinks caused itching and cough 08/12/12  . Penicillins Swelling and Rash    Has patient had a PCN reaction causing immediate rash, facial/tongue/throat swelling, SOB or lightheadedness with hypotension: no Has patient had a PCN reaction causing severe rash involving mucus membranes or skin necrosis: no Has patient had a PCN reaction that required hospitalization {no Has patient had a PCN reaction occurring within the last 10 years: {no If all of the above answers are "NO", then may proceed with Cephalosporin use.  [facility] Cross Reactors Rash    Current Outpatient Prescriptions  Medication Sig Dispense Refill  . albuterol (PROVENTIL) (2.5 MG/3ML) 0.083% nebulizer solution Take 2.5 mg by nebulization every 6 (six) hours as needed for wheezing or shortness of breath.    . clindamycin (  CLEOCIN) 300 MG capsule Take 1 capsule (300 mg total) by mouth 3 (three) times daily. 21 capsule 0  . digoxin (DIGOX) 0.125 MG tablet Take 1 tablet mouth daily except none on Sundays or as directed 30 tablet 5  . feeding supplement, ENSURE ENLIVE, (ENSURE ENLIVE) LIQD Take 237 mLs by mouth 2 (two) times daily between meals.    . furosemide (LASIX) 20 MG tablet Take 20 mg by mouth daily as needed for fluid.    . hydrALAZINE (APRESOLINE) 10 MG tablet Take 1 tablet (10 mg total) by mouth 3 (three) times daily. 270 tablet 3  . levothyroxine (SYNTHROID, LEVOTHROID) 112 MCG tablet Take 112 mcg by mouth daily before breakfast.    . lidocaine (XYLOCAINE) 5 % ointment Apply 1 application topically as needed. 35.44 g 0  . metoprolol tartrate (LOPRESSOR) 25 MG tablet Take 1 tablet (25 mg total) by mouth daily. 90 tablet 0  . polyethylene glycol powder (GLYCOLAX/MIRALAX) powder Take 17 g by mouth daily as needed for moderate constipation.      . potassium chloride (K-DUR) 10 MEQ tablet Take 10 mEq by mouth daily as needed for fluid.    . metroNIDAZOLE (METROGEL VAGINAL) 0.75 % vaginal gel Place 1 Applicatorful vaginally at bedtime. For 5 nights (Patient not taking: Reported on 04/19/2017) 70 g 0   No current facility-administered medications for this visit.     ROS:   General:  No weight loss, Fever, chills  HEENT: No recent headaches, no nasal bleeding, no visual changes, no sore throat  Neurologic: No dizziness, blackouts, seizures. No recent symptoms of stroke or mini- stroke. No recent episodes of slurred speech, or temporary blindness.  Cardiac: No recent episodes of chest pain/pressure, no shortness of breath at rest.  No shortness of breath with exertion.  Denies history of atrial fibrillation or irregular heartbeat  Vascular: No history of rest pain in feet.  No history of claudication.  No history of non-healing ulcer, No history of DVT   Pulmonary: No home oxygen, no productive cough, no hemoptysis,  No asthma or wheezing  Musculoskeletal:  [ ]  Arthritis, [ ]  Low back pain,  [ ]  Joint pain  Hematologic:No history of hypercoagulable state.  No history of easy bleeding.  +history of anemia  Gastrointestinal: No hematochezia or melena,  No gastroesophageal reflux, no trouble swallowing  Urinary: [ ]  chronic Kidney disease, [ ]  on HD - [ ]  MWF or [ ]  TTHS, [ ]  Burning with urination, [ ]  Frequent urination, [ ]  Difficulty urinating;   Skin: No rashes  Psychological: No history of anxiety,  No history of depression   Physical Examination  Vitals:   04/19/17 1401 04/19/17 1402  BP: (!) 150/77 (!) 161/74  Pulse: 67 67  Resp: 14   Temp: 97.5 F (36.4 C)   SpO2: 95%   Weight: 115 lb (52.2 kg)   Height: 5\' 1"  (1.549 m)     Body mass index is 21.73 kg/m.  General:  Alert and oriented, no acute distress HEENT: Normal Neck: No bruit or JVD Pulmonary: Clear to auscultation bilaterally Cardiac: Regular Rate  and Rhythm without murmur Abdomen: Soft, non-tender, non-distended, no mass, no scars Skin: No rash Extremity Pulses:  2+ radial, brachial, femoral, 2+ right dorsalis pedis absent left dorsalis pedis, 2+ posterior tibial pulses bilaterally Musculoskeletal: No deformity, edema extending from the ankle to the knee bilaterally Neurologic: Upper and lower extremity motor 5/5 and symmetric  DATA:  She had a venous duplex ultrasound  today for reflux. This showed no evidence of DVT. She did have deep vein reflux bilaterally. Superficial veins were intact with no evidence of greater saphenous vein reflux.  ASSESSMENT:  Numbness tingling coolness in feet most likely secondary to neuropathy of unknown etiology. She does not have evidence of arterial occlusive disease with palpable pulses in both feet. She does have evidence of some reflux in her deep venous system. There is no intervention for this. Compression therapy is the mainstay of treatment for this. The patient states she has compression stockings currently.   PLAN:  #1 patient was given a prescription today for Neurontin 100 mg daily at bedtime. I discussed with the patient today that she may have some sedation type symptoms from this. If she does not not like the way that the medication makes her feel we can discontinue that. If she doesn't feel like she is getting any improvement in her symptoms at this low dose, I will leave this at the discretion of Dr. Nevada Crane whether or not he wishes to titrate this up. She was given a prescription for 30 tablets with 3 refills.  #2 deep vein reflux compression stockings daily as she is currently doing  #3 lower extremity arteries no significant pathology.  She will follow-up with [place] on as-needed basis.   Ruta Hinds, MD [facility] Office: [phone] Pager: [phone]

## 2017-04-19 NOTE — Progress Notes (Signed)
Vitals:   04/19/17 1401  BP: (!) 150/77  Pulse: 67  Resp: 14  Temp: 97.5 F (36.4 C)  SpO2: 95%  Weight: 115 lb (52.2 kg)  Height: 5\' 1"  (1.549 m)

## 2017-04-19 NOTE — Telephone Encounter (Signed)
Pt's insurance has denied covering Cleocin Supp. Manus Gunning, CNM has ordered Cleocin 300mg  capsule. Left message letting pt know. [place]

## 2017-04-20 ENCOUNTER — Encounter (HOSPITAL_BASED_OUTPATIENT_CLINIC_OR_DEPARTMENT_OTHER): Payer: Medicare Other

## 2017-04-20 ENCOUNTER — Encounter (HOSPITAL_COMMUNITY): Payer: Self-pay

## 2017-04-20 VITALS — BP 166/47 | HR 63 | Temp 97.5°F | Resp 18

## 2017-04-20 DIAGNOSIS — D500 Iron deficiency anemia secondary to blood loss (chronic): Secondary | ICD-10-CM

## 2017-04-20 MED ORDER — SODIUM CHLORIDE 0.9 % IV SOLN
750.0000 mg | Freq: Once | INTRAVENOUS | Status: AC
  Administered 2017-04-20: 750 mg via INTRAVENOUS
  Filled 2017-04-20: qty 15

## 2017-04-20 MED ORDER — SODIUM CHLORIDE 0.9 % IV SOLN
INTRAVENOUS | Status: DC
  Administered 2017-04-20: 11:00:00 via INTRAVENOUS

## 2017-04-20 NOTE — Patient Instructions (Signed)
[facility] Discharge Instructions  RECOMMENDATIONS MADE BY THE CONSULTANT AND ANY TEST RESULTS WILL BE SENT TO YOUR REFERRING PHYSICIAN.  Received Injectafer infusion today. Follow-up as scheduled. Call clinic for any questions or concerns  Thank you for choosing [facility] to provide your oncology and hematology care.  To afford each patient quality time with our provider, please arrive at least 15 minutes before your scheduled appointment time.    If you have a lab appointment with the Cancer Center please come in thru the  Main Entrance and check in at the main information desk  You need to re-schedule your appointment should you arrive 10 or more minutes late.  We strive to give you quality time with our providers, and arriving late affects you and other patients whose appointments are after yours.  Also, if you no show three or more times for appointments you may be dismissed from the clinic at the providers discretion.     Again, thank you for choosing [facility].  Our hope is that these requests will decrease the amount of time that you wait before being seen by our physicians.       _____________________________________________________________  Should you have questions after your visit to [facility], please contact our office at [phone] between the hours of 8:30 a.m. and 4:30 p.m.  Voicemails left after 4:30 p.m. will not be returned until the following business day.  For prescription refill requests, have your pharmacy contact our office.       Resources For Cancer Patients and their Caregivers ? American Cancer Society: Can assist with transportation, wigs, general needs, runs Look Good Feel Better.        1-888-227-6333 ? Cancer Care: Provides financial assistance, online support groups, medication/co-pay assistance.  1-800-813-HOPE (4673) ? [facility] Assists [place] cancer patients and their families through emotional , educational and financial support.  336-427-4357 ? [place] DSS Where to apply for food stamps, Medicaid and utility assistance. 336-342-1394 ? [facility]: Transportation to medical appointments. 336-347-2287 ? Social Security Administration: May apply for disability if have a Stage IV cancer. 336-342-7796 1-800-772-1213 ? [place] Aging, Disability and Transit Services: Assists with nutrition, care and transit needs. 336-349-2343  Cancer Center Support Programs: @10RELATIVEDAYS@ > Cancer Support Group  2nd Tuesday of the month 1pm-2pm, Journey Room  > Creative Journey  3rd Tuesday of the month 1130am-1pm, Journey Room  > Look Good Feel Better  1st Wednesday of the month 10am-12 noon, Journey Room (Call American Cancer Society to register 1-800-395-5775)   

## 2017-04-20 NOTE — Progress Notes (Signed)
[name] tolerated Injectafer infusion well without complaints or incident.Peripheral IV site checked for blood return prior to,during and after infusion. VSS upon discharge. Pt discharged self ambulatory in satisfactory condition

## 2017-04-27 ENCOUNTER — Encounter (HOSPITAL_COMMUNITY): Payer: Medicare Other

## 2017-04-27 NOTE — Progress Notes (Signed)
Nutrition Assessment   Reason for Assessment:   Weight loss/poor appetite  ASSESSMENT:  83 year old female with anemia from gastric antral vascular ectasia. History of HTN as well.  Patient reports that she has dentures that do not fit well and has gone back and forth to dentist over the last 3 years without much success.  Reports went as early as yesterday to get them worked on.  "I feel like I have too much in my mouth and can't swallow solid foods very well." Liquids go down great." Also reports that she does not get hungry. Reports that she drinks 2 boost/ensure per day (original and plus).  Reports typically has coffee and cereal for breakfast, lunch cooked vegetables (green beans, carrots and potatoes, supper meal has tyson chicken strips or fish or hamburger with a vegetable.    Nutrition Focused Physical Exam: deferred  Medications: miralax, Kdur, lasix  Labs: reviewed  Anthropometrics:   Height: 61 inches Weight: 115 lb UBW: 120 lb in Decemeber BMI: 21  Noted 4 lb weight loss since Jan 2018  3% weight loss in the last 5 months   Estimated Energy Needs  Kcals: 1300-1500 calories/d Protein: 62-78 g/d Fluid: 1.5 L/d  NUTRITION DIAGNOSIS: Inadequate oral intake related to poor fitting dentures and does not get hungry as evidenced by 3% weight loss in 5 months   MALNUTRITION DIAGNOSIS: none at this time   INTERVENTION:   Discussed oral nutrition supplements and calorie difference in products. Samples and coupons given. 1st complimentary case of ensure plus given as well.  Encouraged at least 2 per day. Encouraged foods high in calories and protein. Fact sheet given on ways to increase calories and protein Discussed good sources of protein, especially soft proteins due to dentures.  Fact sheet on soft moist proteins given.    MONITORING, EVALUATION, GOAL: Patient will consume adequate calories and protein to prevent weight loss   NEXT VISIT: phone call in 2  months  Chasey Dull B. Zenia Resides, [place], [place] Registered Dietitian [phone] (pager)

## 2017-05-03 DIAGNOSIS — M542 Cervicalgia: Secondary | ICD-10-CM | POA: Diagnosis not present

## 2017-05-03 DIAGNOSIS — M9901 Segmental and somatic dysfunction of cervical region: Secondary | ICD-10-CM | POA: Diagnosis not present

## 2017-05-14 ENCOUNTER — Encounter (HOSPITAL_COMMUNITY): Payer: Medicare Other | Attending: Oncology

## 2017-05-14 DIAGNOSIS — K31819 Angiodysplasia of stomach and duodenum without bleeding: Secondary | ICD-10-CM | POA: Insufficient documentation

## 2017-05-14 DIAGNOSIS — R634 Abnormal weight loss: Secondary | ICD-10-CM | POA: Insufficient documentation

## 2017-05-14 DIAGNOSIS — D500 Iron deficiency anemia secondary to blood loss (chronic): Secondary | ICD-10-CM | POA: Insufficient documentation

## 2017-05-14 LAB — CBC WITH DIFFERENTIAL/PLATELET
BASOS ABS: 0.1 10*3/uL (ref 0.0–0.1)
Basophils Relative: 1 %
Eosinophils Absolute: 0.1 10*3/uL (ref 0.0–0.7)
Eosinophils Relative: 2 %
HEMATOCRIT: 41.7 % (ref 36.0–46.0)
HEMOGLOBIN: 13.7 g/dL (ref 12.0–15.0)
LYMPHS ABS: 0.8 10*3/uL (ref 0.7–4.0)
LYMPHS PCT: 10 %
MCH: 32.2 pg (ref 26.0–34.0)
MCHC: 32.9 g/dL (ref 30.0–36.0)
MCV: 97.9 fL (ref 78.0–100.0)
Monocytes Absolute: 1.3 10*3/uL — ABNORMAL HIGH (ref 0.1–1.0)
Monocytes Relative: 16 %
NEUTROS ABS: 5.7 10*3/uL (ref 1.7–7.7)
Neutrophils Relative %: 71 %
Platelets: 277 10*3/uL (ref 150–400)
RBC: 4.26 MIL/uL (ref 3.87–5.11)
RDW: 14.5 % (ref 11.5–15.5)
WBC: 8 10*3/uL (ref 4.0–10.5)

## 2017-05-14 LAB — FERRITIN: Ferritin: 196 ng/mL (ref 11–307)

## 2017-05-17 DIAGNOSIS — M542 Cervicalgia: Secondary | ICD-10-CM | POA: Diagnosis not present

## 2017-05-17 DIAGNOSIS — M9901 Segmental and somatic dysfunction of cervical region: Secondary | ICD-10-CM | POA: Diagnosis not present

## 2017-05-22 DIAGNOSIS — H524 Presbyopia: Secondary | ICD-10-CM | POA: Diagnosis not present

## 2017-05-22 DIAGNOSIS — H5203 Hypermetropia, bilateral: Secondary | ICD-10-CM | POA: Diagnosis not present

## 2017-05-22 DIAGNOSIS — H52223 Regular astigmatism, bilateral: Secondary | ICD-10-CM | POA: Diagnosis not present

## 2017-05-22 DIAGNOSIS — H26491 Other secondary cataract, right eye: Secondary | ICD-10-CM | POA: Diagnosis not present

## 2017-05-29 ENCOUNTER — Other Ambulatory Visit: Payer: Self-pay | Admitting: Internal Medicine

## 2017-05-29 DIAGNOSIS — Z1231 Encounter for screening mammogram for malignant neoplasm of breast: Secondary | ICD-10-CM

## 2017-06-05 ENCOUNTER — Other Ambulatory Visit: Payer: Self-pay

## 2017-06-05 DIAGNOSIS — Z6822 Body mass index (BMI) 22.0-22.9, adult: Secondary | ICD-10-CM | POA: Diagnosis not present

## 2017-06-05 DIAGNOSIS — R600 Localized edema: Secondary | ICD-10-CM | POA: Diagnosis not present

## 2017-06-05 DIAGNOSIS — G64 Other disorders of peripheral nervous system: Secondary | ICD-10-CM | POA: Diagnosis not present

## 2017-06-05 DIAGNOSIS — J3489 Other specified disorders of nose and nasal sinuses: Secondary | ICD-10-CM | POA: Diagnosis not present

## 2017-06-06 ENCOUNTER — Other Ambulatory Visit: Payer: Self-pay

## 2017-06-06 MED ORDER — HYDRALAZINE HCL 10 MG PO TABS: 10.0000 mg | ORAL_TABLET | Freq: Three times a day (TID) | ORAL | 3 refills | Status: DC

## 2017-06-08 ENCOUNTER — Ambulatory Visit
Admission: RE | Admit: 2017-06-08 | Discharge: 2017-06-08 | Disposition: A | Discharge status: Home / Self Care | Payer: Medicare Other | Source: Ambulatory Visit | Attending: Internal Medicine | Admitting: Internal Medicine

## 2017-06-08 ENCOUNTER — Other Ambulatory Visit: Payer: Self-pay

## 2017-06-08 DIAGNOSIS — Z1231 Encounter for screening mammogram for malignant neoplasm of breast: Secondary | ICD-10-CM

## 2017-06-08 MED ORDER — HYDRALAZINE HCL 10 MG PO TABS: 10.0000 mg | ORAL_TABLET | Freq: Three times a day (TID) | ORAL | 3 refills | Status: DC

## 2017-06-11 ENCOUNTER — Other Ambulatory Visit: Payer: Self-pay

## 2017-06-11 MED ORDER — HYDRALAZINE HCL 10 MG PO TABS: 10.0000 mg | ORAL_TABLET | Freq: Three times a day (TID) | ORAL | 3 refills | Status: DC

## 2017-06-12 ENCOUNTER — Telehealth (INDEPENDENT_AMBULATORY_CARE_PROVIDER_SITE_OTHER): Payer: Self-pay | Admitting: Internal Medicine

## 2017-06-12 NOTE — Telephone Encounter (Signed)
At the time of patient's last ov on 04/17/2017 she weighed 118 lbs 9.6 oz.

## 2017-06-12 NOTE — Telephone Encounter (Signed)
Patient presented to the office for a weight check.  I weighed the patient, her weight was 120.3.

## 2017-06-13 NOTE — Telephone Encounter (Signed)
Forwarded to Dr.Rehman as FY At the time of patient's last ov on 04/17/2017 she weighed 118 lbs 9.6 oz.      Documentation     Surrett, Hope H routed conversation to You 20 hours ago (12:22 PM)    Surrett, Hope H 20 hours ago (12:22 PM)      Patient presented to the office for a weight check.  I weighed the patient, her weight was 120.3.       I.

## 2017-06-14 ENCOUNTER — Encounter (HOSPITAL_COMMUNITY): Payer: Medicare Other | Attending: Oncology

## 2017-06-14 DIAGNOSIS — R634 Abnormal weight loss: Secondary | ICD-10-CM | POA: Diagnosis not present

## 2017-06-14 DIAGNOSIS — K31819 Angiodysplasia of stomach and duodenum without bleeding: Secondary | ICD-10-CM | POA: Diagnosis not present

## 2017-06-14 DIAGNOSIS — D500 Iron deficiency anemia secondary to blood loss (chronic): Secondary | ICD-10-CM | POA: Diagnosis not present

## 2017-06-14 LAB — CBC WITH DIFFERENTIAL/PLATELET
BASOS PCT: 1 %
Basophils Absolute: 0.1 10*3/uL (ref 0.0–0.1)
Eosinophils Absolute: 0.1 10*3/uL (ref 0.0–0.7)
Eosinophils Relative: 2 %
HEMATOCRIT: 41.5 % (ref 36.0–46.0)
Hemoglobin: 13.7 g/dL (ref 12.0–15.0)
LYMPHS PCT: 7 %
Lymphs Abs: 0.6 10*3/uL — ABNORMAL LOW (ref 0.7–4.0)
MCH: 32.1 pg (ref 26.0–34.0)
MCHC: 33 g/dL (ref 30.0–36.0)
MCV: 97.2 fL (ref 78.0–100.0)
Monocytes Absolute: 1.2 10*3/uL — ABNORMAL HIGH (ref 0.1–1.0)
Monocytes Relative: 16 %
NEUTROS ABS: 5.8 10*3/uL (ref 1.7–7.7)
NEUTROS PCT: 74 %
Platelets: 309 10*3/uL (ref 150–400)
RBC: 4.27 MIL/uL (ref 3.87–5.11)
RDW: 13.8 % (ref 11.5–15.5)
WBC: 7.8 10*3/uL (ref 4.0–10.5)

## 2017-06-14 LAB — FERRITIN: FERRITIN: 102 ng/mL (ref 11–307)

## 2017-06-14 NOTE — Telephone Encounter (Signed)
Patient was called but she was not at home. Will try again.

## 2017-06-14 NOTE — Telephone Encounter (Signed)
Patient presented to the office and I made her aware of Dr.Rehman's recommendation. Weight check in 2 months.

## 2017-06-14 NOTE — Telephone Encounter (Signed)
Weight gradually coming up. Can recheck in 2 months.

## 2017-06-18 ENCOUNTER — Other Ambulatory Visit: Payer: Self-pay

## 2017-06-18 ENCOUNTER — Other Ambulatory Visit: Payer: Self-pay | Admitting: Cardiology

## 2017-06-18 MED ORDER — METOPROLOL TARTRATE 25 MG PO TABS: 25.0000 mg | ORAL_TABLET | Freq: Every day | ORAL | 3 refills | Status: DC

## 2017-06-18 NOTE — Telephone Encounter (Signed)
New Message   *STAT* If patient is at the pharmacy, call can be transferred to refill team.   1. Which medications need to be refilled? (please list name of each medication and dose if known) Metoprolol 25mg    2. Which pharmacy/location (including street and city if local pharmacy) is medication to be sent to? [facility] [place].  3. Do they need a 30 day or 90 day supply? 106 '

## 2017-06-18 NOTE — Telephone Encounter (Signed)
Medication Detail    Disp Refills Start End   metoprolol tartrate (LOPRESSOR) 25 MG tablet 90 tablet 3 06/18/2017    Sig - Route: Take 1 tablet (25 mg total) by mouth daily. - Oral   Sent to pharmacy as: metoprolol tartrate (LOPRESSOR) 25 MG tablet   E-Prescribing Status: Receipt confirmed by pharmacy (06/18/2017 11:45 AM EDT)   Pharmacy   [place], [place] - [address] [place]

## 2017-06-21 ENCOUNTER — Other Ambulatory Visit: Payer: Self-pay

## 2017-06-21 MED ORDER — METOPROLOL TARTRATE 25 MG PO TABS: 25.0000 mg | ORAL_TABLET | Freq: Every day | ORAL | 3 refills | Status: DC

## 2017-06-22 ENCOUNTER — Telehealth (HOSPITAL_COMMUNITY): Payer: Self-pay

## 2017-06-22 NOTE — Telephone Encounter (Signed)
Nutrition Follow-up:  Spoke with patient via phone this am for nutrition follow-up.  Patient reports that her weight is coming up a little bit.    Reports that she is drinking at least 2 ensures per day (sometimes original or plus).  Reports that she is eating about 4 small meals per day.  Typically for breakfast cereal or oatmeal or egg, for lunch may eat sandwich, supper usually meat and sometimes some vegetables.  Usually drinks ensure before bed and at lunch time.   Reports she still has issues with her dentures and often takes them out to eat.    Medications: reviewed  Labs: reviewed  Anthropometrics:   Noted weight at Dr Olevia Perches office on 7/31 120 lb 3 oz.   Increased from weight on 6/15 of 115 lb   NUTRITION DIAGNOSIS: Inadequate oral intake improving   MALNUTRITION DIAGNOSIS: continue to monitor   INTERVENTION:   Encouraged patient to continue with drinking at least 2 oral nutrition supplements with at least 350 calories or more per day.  Will plan to give patient another case of ensure plus at September 7th appointment Discussed ways to increase calories and protein with current eating pattern.      MONITORING, EVALUATION, GOAL: Patient will consume adequate calories and protein to prevent weight loss   NEXT VISIT: Friday, September 7th after MD visit  Anabeth Chilcott B. Zenia Resides, [place], [place] Registered Dietitian [phone] (pager)

## 2017-06-29 DIAGNOSIS — R269 Unspecified abnormalities of gait and mobility: Secondary | ICD-10-CM | POA: Diagnosis not present

## 2017-06-29 DIAGNOSIS — Z6822 Body mass index (BMI) 22.0-22.9, adult: Secondary | ICD-10-CM | POA: Diagnosis not present

## 2017-06-29 DIAGNOSIS — F419 Anxiety disorder, unspecified: Secondary | ICD-10-CM | POA: Diagnosis not present

## 2017-06-29 DIAGNOSIS — F39 Unspecified mood [affective] disorder: Secondary | ICD-10-CM | POA: Diagnosis not present

## 2017-07-13 DIAGNOSIS — J060 Acute laryngopharyngitis: Secondary | ICD-10-CM | POA: Diagnosis not present

## 2017-07-13 DIAGNOSIS — Z6822 Body mass index (BMI) 22.0-22.9, adult: Secondary | ICD-10-CM | POA: Diagnosis not present

## 2017-07-20 ENCOUNTER — Encounter (HOSPITAL_COMMUNITY): Payer: Medicare Other | Attending: Oncology | Admitting: Oncology

## 2017-07-20 ENCOUNTER — Encounter (HOSPITAL_COMMUNITY): Payer: Medicare Other

## 2017-07-20 ENCOUNTER — Encounter (HOSPITAL_COMMUNITY): Payer: Medicare Other | Attending: Oncology

## 2017-07-20 ENCOUNTER — Encounter (HOSPITAL_COMMUNITY): Payer: Self-pay

## 2017-07-20 VITALS — BP 177/68 | HR 65 | Temp 98.3°F | Resp 18 | Wt 118.8 lb

## 2017-07-20 DIAGNOSIS — R05 Cough: Secondary | ICD-10-CM | POA: Diagnosis not present

## 2017-07-20 DIAGNOSIS — D500 Iron deficiency anemia secondary to blood loss (chronic): Secondary | ICD-10-CM

## 2017-07-20 DIAGNOSIS — K31819 Angiodysplasia of stomach and duodenum without bleeding: Secondary | ICD-10-CM | POA: Insufficient documentation

## 2017-07-20 DIAGNOSIS — R609 Edema, unspecified: Secondary | ICD-10-CM | POA: Diagnosis not present

## 2017-07-20 LAB — COMPREHENSIVE METABOLIC PANEL
ALT: 24 U/L (ref 14–54)
AST: 30 U/L (ref 15–41)
Albumin: 2.7 g/dL — ABNORMAL LOW (ref 3.5–5.0)
Alkaline Phosphatase: 81 U/L (ref 38–126)
Anion gap: 5 (ref 5–15)
BUN: 22 mg/dL — AB (ref 6–20)
CHLORIDE: 100 mmol/L — AB (ref 101–111)
CO2: 30 mmol/L (ref 22–32)
Calcium: 8.9 mg/dL (ref 8.9–10.3)
Creatinine, Ser: 0.75 mg/dL (ref 0.44–1.00)
Glucose, Bld: 80 mg/dL (ref 65–99)
POTASSIUM: 4.5 mmol/L (ref 3.5–5.1)
SODIUM: 135 mmol/L (ref 135–145)
Total Bilirubin: 0.3 mg/dL (ref 0.3–1.2)
Total Protein: 6.3 g/dL — ABNORMAL LOW (ref 6.5–8.1)

## 2017-07-20 LAB — CBC WITH DIFFERENTIAL/PLATELET
Basophils Absolute: 0.1 10*3/uL (ref 0.0–0.1)
Basophils Relative: 1 %
Eosinophils Absolute: 0.3 10*3/uL (ref 0.0–0.7)
Eosinophils Relative: 4 %
HEMATOCRIT: 40.3 % (ref 36.0–46.0)
Hemoglobin: 13.5 g/dL (ref 12.0–15.0)
LYMPHS ABS: 0.5 10*3/uL — AB (ref 0.7–4.0)
LYMPHS PCT: 8 %
MCH: 32.5 pg (ref 26.0–34.0)
MCHC: 33.5 g/dL (ref 30.0–36.0)
MCV: 97.1 fL (ref 78.0–100.0)
Monocytes Absolute: 1.1 10*3/uL — ABNORMAL HIGH (ref 0.1–1.0)
Monocytes Relative: 15 %
NEUTROS PCT: 72 %
Neutro Abs: 5.2 10*3/uL (ref 1.7–7.7)
Platelets: 327 10*3/uL (ref 150–400)
RBC: 4.15 MIL/uL (ref 3.87–5.11)
RDW: 13.4 % (ref 11.5–15.5)
WBC: 7.1 10*3/uL (ref 4.0–10.5)

## 2017-07-20 LAB — FERRITIN: Ferritin: 123 ng/mL (ref 11–307)

## 2017-07-20 NOTE — Progress Notes (Signed)
Nutrition Follow-up:  Patient with anemia from gastric antral vascular ectasia.   Patient seen in clinic after MD appointment today.  Patient reports that she has had the "crud" over the last few weeks and has been on antibiotics.  This has effected appetite a little bit but reports it is improving.   Patient reports has been drinking at least 2 ensure plus per day every day and sometimes 3 a day.  Reports that she is continuing to try and eat 3 meals a day in addition to drinking shakes.    Patient reports teeth are still and issue when eating so usually takes them out.   No other nutrition symptoms reported.     Medications: reviewed  Labs: reviewed  Anthropometrics:   Patient weight today in clinic 118 lb increased from 115 lb on 6/15.     NUTRITION DIAGNOSIS: Inadequate oral intake stable   MALNUTRITION DIAGNOSIS: continue to monitor   INTERVENTION:   Encouraged patient to increase ensure plus to 3 per day in addition to normal meals of 3 a day.   Reviewed strategies to increase calories and protein with patient and daughter in law, [place].   Provided patient with 2nd case of ensure plus today in clinic.     MONITORING, EVALUATION, GOAL: weight trends, intake   NEXT VISIT: Jan 4 after MD appointment  Nadia Torr B. Zenia Resides, [place], [place] Registered Dietitian [phone] (pager)

## 2017-07-20 NOTE — Progress Notes (Signed)
[place]:  Medical Oncology/Hematology  PCP:  Celene Squibb, [place] [address]  REASON FOR VISIT:  Follow-up for iron deficiency anemia secondary to GAVE syndrome.   CURRENT THERAPY: IV Iron prn   HISTORY OF PRESENT ILLNESS: [name] 83 y.o. 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   INTERVAL HISTORY:  [name] presents for continued follow-up for severe iron deficiency anemia secondary to GI blood loss/GAVE (gastric antral vascular ectasia).   Patient presents today with her daughter-in-law for follow-up. She states that she has been doing well except that she has been having coughing fits in morning evening which she thinks is secondary to a viral infection. This infection has been going around her house. She took a Z-Pak previously with no improvement in her symptoms. She denies any associated fevers or chills, shortness breath or productive sputum. She states that even though she continues to have difficulty chewing due to her dentures, her appetite has improved. She denies any melena or hematochezia. She continues to have chronic swelling in her legs bilaterally.  Oncology Flowsheet 12/15/2016 02/16/2017 04/20/2017  enoxaparin (LOVENOX) [place]     ferric carboxymaltose (INJECTAFER) IV 750 mg 750 mg 750 mg  ferumoxytol (FERAHEME) IV        REVIEW OF SYSTEMS:  Review of Systems  Constitutional: Negative for appetite change, fatigue and fever.       Difficulty chewing due to her dentures  HENT:  Negative.   Eyes: Negative.   Respiratory: Positive for cough. Negative for shortness of breath.   Cardiovascular: Negative.  Negative for chest pain, leg swelling and palpitations.  Gastrointestinal: Negative.  Negative for abdominal pain, blood in stool, constipation, diarrhea, nausea and vomiting.  Endocrine: Negative.     Genitourinary: Negative.  Negative for dysuria, hematuria and vaginal bleeding.   Musculoskeletal: Negative.   Skin: Negative.   Neurological:       Peripheral neuropathy to toes (chronic)   Hematological: Negative.  Does not bruise/bleed easily.  Psychiatric/Behavioral: Negative.      PAST MEDICAL/SURGICAL HISTORY:  Past Medical History:  Diagnosis Date  . Anemia   . Chronic bronchitis ([place])   . GAVE (gastric antral vascular ectasia) 09/12/12  . Hypertension   . Hypothyroidism   . Iron deficiency anemia 10/15/2012  . Iron deficiency anemia due to chronic blood loss 04/20/2014  . On home oxygen therapy    "2L/[place] at night" (11/07/2016)  . Palpitations   . Pneumonia 06/2016; 10/2016  . Syncope and collapse 11/06/2016   Past Surgical History:  Procedure Laterality Date  . BACK SURGERY    . BIOPSY  12/27/2016   Procedure: BIOPSY;  Surgeon: Rogene Houston, MD;  [facility];  Service: Endoscopy;;  gastric  . COLONOSCOPY  07/24/2012   Procedure: COLONOSCOPY;  Surgeon: Rogene Houston, MD;  [facility];  Service: Endoscopy;  Laterality: N/A;  200  . DILATION AND CURETTAGE OF UTERUS    . ESOPHAGOGASTRODUODENOSCOPY  09/12/2012   Procedure: ESOPHAGOGASTRODUODENOSCOPY (EGD);  Surgeon: Rogene Houston, MD;  [facility];  Service: Endoscopy;  Laterality: N/A;  325-changed to 200 per Ann-pt moved up to [place] to notify pt  . ESOPHAGOGASTRODUODENOSCOPY  11/01/2012   Procedure: ESOPHAGOGASTRODUODENOSCOPY (EGD);  Surgeon: Rogene Houston, MD;  [facility];  Service: Endoscopy;  Laterality:  N/A;  1:20  . ESOPHAGOGASTRODUODENOSCOPY N/A 07/04/2013   Procedure: ESOPHAGOGASTRODUODENOSCOPY (EGD);  Surgeon: Rogene Houston, MD;  [facility];  Service: Endoscopy;  Laterality: N/A;  850  . ESOPHAGOGASTRODUODENOSCOPY N/A 08/06/2013   Procedure: ESOPHAGOGASTRODUODENOSCOPY (EGD);  Surgeon: Rogene Houston, MD;  [facility];  Service:  Endoscopy;  Laterality: N/A;  1200  . ESOPHAGOGASTRODUODENOSCOPY N/A 10/31/2013   Procedure: ESOPHAGOGASTRODUODENOSCOPY (EGD);  Surgeon: Rogene Houston, MD;  [facility];  Service: Endoscopy;  Laterality: N/A;  125-moved to [place] notified pt  . ESOPHAGOGASTRODUODENOSCOPY N/A 10/23/2014   Procedure: ESOPHAGOGASTRODUODENOSCOPY (EGD);  Surgeon: Rogene Houston, MD;  [facility];  Service: Endoscopy;  Laterality: N/A;  1020  . ESOPHAGOGASTRODUODENOSCOPY N/A 10/11/2016   Procedure: ESOPHAGOGASTRODUODENOSCOPY (EGD);  Surgeon: Rogene Houston, MD;  [facility];  Service: Endoscopy;  Laterality: N/A;  245  . ESOPHAGOGASTRODUODENOSCOPY N/A 12/27/2016   Procedure: ESOPHAGOGASTRODUODENOSCOPY (EGD);  Surgeon: Rogene Houston, MD;  [facility];  Service: Endoscopy;  Laterality: N/A;  1225  . HEEL SPUR SURGERY Bilateral   . HOT HEMOSTASIS  11/01/2012   Procedure: HOT HEMOSTASIS (ARGON PLASMA COAGULATION/BICAP);  Surgeon: Rogene Houston, MD;  [facility];  Service: Endoscopy;  Laterality: N/A;  . HOT HEMOSTASIS N/A 07/04/2013   Procedure: HOT HEMOSTASIS (ARGON PLASMA COAGULATION/BICAP);  Surgeon: Rogene Houston, MD;  [facility];  Service: Endoscopy;  Laterality: N/A;  . HOT HEMOSTASIS N/A 08/06/2013   Procedure: HOT HEMOSTASIS (ARGON PLASMA COAGULATION/BICAP);  Surgeon: Rogene Houston, MD;  [facility];  Service: Endoscopy;  Laterality: N/A;  . HOT HEMOSTASIS N/A 10/31/2013   Procedure: HOT HEMOSTASIS (ARGON PLASMA COAGULATION/BICAP);  Surgeon: Rogene Houston, MD;  [facility];  Service: Endoscopy;  Laterality: N/A;  . HOT HEMOSTASIS N/A 10/23/2014   Procedure: HOT HEMOSTASIS (ARGON PLASMA COAGULATION/BICAP);  Surgeon: Rogene Houston, MD;  [facility];  Service: Endoscopy;  Laterality: N/A;  . HOT HEMOSTASIS N/A 10/11/2016   Procedure: HOT HEMOSTASIS (ARGON PLASMA COAGULATION/BICAP);  Surgeon:  Rogene Houston, MD;  [facility];  Service: Endoscopy;  Laterality: N/A;  . JOINT REPLACEMENT    . KNEE ARTHROSCOPY Right    "before replacement"  . LUMBAR DISC SURGERY     "bone spur  . LUMBAR FUSION  06/25/2014   L4 & L5  . SHOULDER SURGERY Right    "spur"  . TONSILLECTOMY AND ADENOIDECTOMY    . TOTAL ABDOMINAL HYSTERECTOMY    . TOTAL KNEE ARTHROPLASTY Right   . US ECHOCARDIOGRAPHY  03/12/2006   EF 55-60%    ALLERGIES:  Allergies  Allergen Reactions  . Flagyl [Metronidazole] Diarrhea, Nausea Only and Other (See Comments)    Dizziness and feeling of inbalance  . Avapro [Irbesartan] Other (See Comments)    Cough   . Clarithromycin Other (See Comments)    Unknown   . Losartan Potassium-Hctz Other (See Comments)    Unknown  . Maxzide [Hydrochlorothiazide W-Triamterene] Other (See Comments)    Unknown   . Ziac [Bisoprolol-Hydrochlorothiazide]     Thinks caused itching and cough 08/12/12  . Penicillins Swelling and Rash    Has patient had a PCN reaction causing immediate rash, facial/tongue/throat swelling, SOB or lightheadedness with hypotension: no Has patient had a PCN reaction causing severe rash involving mucus membranes or skin necrosis: no Has patient had a PCN reaction that required hospitalization {no Has patient had a  PCN reaction occurring within the last 10 years: {no If all of the above answers are "NO", then may proceed with Cephalosporin use.  [name] Rash    CURRENT MEDICATIONS:  Outpatient Encounter Prescriptions as of 07/20/2017  Medication Sig  . albuterol (PROVENTIL) (2.5 MG/3ML) 0.083% nebulizer solution Take 2.5 mg by nebulization every 6 (six) hours as needed for wheezing or shortness of breath.  . clindamycin (CLEOCIN) 300 MG capsule Take 1 capsule (300 mg total) by mouth 3 (three) times daily.  . digoxin (DIGOX) 0.125 MG tablet Take 1 tablet mouth daily except none on Sundays or as directed  . feeding supplement, ENSURE  ENLIVE, (ENSURE ENLIVE) LIQD Take 237 mLs by mouth 2 (two) times daily between meals.  . furosemide (LASIX) 20 MG tablet Take 20 mg by mouth daily as needed for fluid.  Marland Kitchen gabapentin (NEURONTIN) 100 MG capsule Take 1 capsule (100 mg total) by mouth at bedtime.  . hydrALAZINE (APRESOLINE) 10 MG tablet Take 1 tablet (10 mg total) by mouth 3 (three) times daily.  Marland Kitchen levothyroxine (SYNTHROID, LEVOTHROID) 112 MCG tablet Take 112 mcg by mouth daily before breakfast.  . lidocaine (XYLOCAINE) 5 % ointment Apply 1 application topically as needed.  . metoprolol tartrate (LOPRESSOR) 25 MG tablet Take 1 tablet (25 mg total) by mouth daily.  . metroNIDAZOLE (METROGEL VAGINAL) 0.75 % vaginal gel Place 1 Applicatorful vaginally at bedtime. For 5 nights  . polyethylene glycol powder (GLYCOLAX/MIRALAX) powder Take 17 g by mouth daily as needed for moderate constipation.   . potassium chloride (K-DUR) 10 MEQ tablet Take 10 mEq by mouth daily as needed for fluid.   No facility-administered encounter medications on file as of 07/20/2017.     SOCIAL HISTORY:  Social History   Social History  . Marital status: Married    Spouse name: N/A  . Number of children: N/A  . Years of education: N/A   Occupational History  . Not on file.   Social History Main Topics  . Smoking status: Never Smoker  . Smokeless tobacco: Never Used  . Alcohol use No  . Drug use: No  . Sexual activity: No   Other Topics Concern  . Not on file   Social History Narrative  . No narrative on file    PHYSICAL EXAMINATION  ECOG PERFORMANCE STATUS: 1 - Symptomatic, but independent.   Vitals:   07/20/17 1059  BP: (!) 177/68  Pulse: 65  Resp: 18  Temp: 98.3 F (36.8 C)  SpO2: 94%   Filed Weights   07/20/17 1059  Weight: 118 lb 12.8 oz (53.9 kg)    Physical Exam  Constitutional: She is oriented to person, place, and time and well-developed, well-nourished, and in no distress.  HENT:  Head: Normocephalic.  Mouth/Throat:  Oropharynx is clear and moist. No oropharyngeal exudate.  Eyes: Pupils are equal, round, and reactive to light. Conjunctivae are normal. No scleral icterus.  Neck: Normal range of motion. Neck supple.  Cardiovascular: Normal rate, regular rhythm and normal heart sounds.   Pulmonary/Chest: Effort normal and breath sounds normal. No respiratory distress. She has no wheezes.  Abdominal: Soft. Bowel sounds are normal. There is no tenderness.  Musculoskeletal: Normal range of motion. She exhibits no edema.  Trace ankle edema bilaterally   Lymphadenopathy:    She has no cervical adenopathy.       Right: No supraclavicular adenopathy present.       Left: No supraclavicular adenopathy present.  Neurological: She  is alert and oriented to person, place, and time. No cranial nerve deficit. Gait normal.  Skin: Skin is warm and dry. No rash noted.  Psychiatric: Mood, memory, affect and judgment normal.  Nursing note and vitals reviewed.    LABORATORY DATA: I have reviewed the data as listed. CBC    Component Value Date/Time   WBC 7.1 07/20/2017 1031   RBC 4.15 07/20/2017 1031   HGB 13.5 07/20/2017 1031   HCT 40.3 07/20/2017 1031   PLT 327 07/20/2017 1031   MCV 97.1 07/20/2017 1031   MCH 32.5 07/20/2017 1031   MCHC 33.5 07/20/2017 1031   RDW 13.4 07/20/2017 1031   LYMPHSABS 0.5 (L) 07/20/2017 1031   MONOABS 1.1 (H) 07/20/2017 1031   EOSABS 0.3 07/20/2017 1031   BASOSABS 0.1 07/20/2017 1031      Chemistry      Component Value Date/Time   NA 133 (L) 11/08/2016 0214   K 3.9 11/08/2016 0214   CL 102 11/08/2016 0214   CO2 25 11/08/2016 0214   BUN 14 11/08/2016 0214   CREATININE 0.85 11/08/2016 0214   CREATININE 1.07 (H) 09/16/2015 0855      Component Value Date/Time   CALCIUM 8.1 (L) 11/08/2016 0214   ALKPHOS 95 07/12/2016 1225   AST 29 07/12/2016 1225   ALT 23 07/12/2016 1225   BILITOT 0.5 07/12/2016 1225      Results for CONNELLY, SPRUELL (MRN [id number])   Ref. Range 03/13/2017  09:45  Ferritin Latest Ref Range: 11 - 307 ng/mL 117     PENDING LABS:     DIAGNOSTIC IMAGING/PROCEDURES: Imaging results reviewed as listed.  Most recent EGD: 12/27/16 (Rehman)    CXR: 12/22/16      ASSESSMENT AND PLAN:  Iron deficiency anemia, secondary to chronic GI related blood loss GAVE syndrome    Iron deficiency anemia secondary to chronic GI blood loss/GAVE syndrome:  -Her CBC is stable; hemoglobin 13.5. Patient has been eating Injectafer roughly every 2 months, therefore will space out her standing labs to every 2 months. Her hemoglobin has been stable in the past 6 months and 13 g/dL range. -Her iron studies are pending at this time, should her ferritin be below 100 platelets that her for another dose of Injectafer. -Continue follow-up with Dr.Rehman as scheduled in December 2018 for her GAVE syndrome.   Dispo:  -Labs q2 months; standing orders already in place for CBC with diff & ferritin, iron studies.  -Return to cancer center in 4 months for follow-up visit and labs (CBC with diff, CMET, and ferritin, iron studies).      All questions were answered. The patient knows to call the clinic with any problems, questions or concerns. We can certainly see the patient much sooner if necessary.  Twana First, MD

## 2017-07-20 NOTE — Patient Instructions (Signed)
[facility] at [facility] Discharge Instructions  RECOMMENDATIONS MADE BY THE CONSULTANT AND ANY TEST RESULTS WILL BE SENT TO YOUR REFERRING PHYSICIAN.  You saw Dr. Zhou today.  Thank you for choosing [facility] at [facility] to provide your oncology and hematology care.  To afford each patient quality time with our provider, please arrive at least 15 minutes before your scheduled appointment time.    If you have a lab appointment with the Cancer Center please come in thru the  Main Entrance and check in at the main information desk  You need to re-schedule your appointment should you arrive 10 or more minutes late.  We strive to give you quality time with our providers, and arriving late affects you and other patients whose appointments are after yours.  Also, if you no show three or more times for appointments you may be dismissed from the clinic at the providers discretion.     Again, thank you for choosing [facility].  Our hope is that these requests will decrease the amount of time that you wait before being seen by our physicians.       _____________________________________________________________  Should you have questions after your visit to [facility], please contact our office at [phone] between the hours of 8:30 a.m. and 4:30 p.m.  Voicemails left after 4:30 p.m. will not be returned until the following business day.  For prescription refill requests, have your pharmacy contact our office.       Resources For Cancer Patients and their Caregivers ? American Cancer Society: Can assist with transportation, wigs, general needs, runs Look Good Feel Better.        1-888-227-6333 ? Cancer Care: Provides financial assistance, online support groups, medication/co-pay assistance.  1-800-813-HOPE (4673) ? [facility] Assists [place] cancer patients and their families through  emotional , educational and financial support.  336-427-4357 ? [place] DSS Where to apply for food stamps, Medicaid and utility assistance. 336-342-1394 ? RCATS: Transportation to medical appointments. 336-347-2287 ? Social Security Administration: May apply for disability if have a Stage IV cancer. 336-342-7796 1-800-772-1213 ? [place] Aging, Disability and Transit Services: Assists with nutrition, care and transit needs. 336-349-2343  Cancer Center Support Programs: @10RELATIVEDAYS@ > Cancer Support Group  2nd Tuesday of the month 1pm-2pm, Journey Room  > Creative Journey  3rd Tuesday of the month 1130am-1pm, Journey Room  > Look Good Feel Better  1st Wednesday of the month 10am-12 noon, Journey Room (Call American Cancer Society to register 1-800-395-5775)    

## 2017-07-25 DIAGNOSIS — J399 Disease of upper respiratory tract, unspecified: Secondary | ICD-10-CM | POA: Diagnosis not present

## 2017-07-27 DIAGNOSIS — J060 Acute laryngopharyngitis: Secondary | ICD-10-CM | POA: Diagnosis not present

## 2017-07-30 DIAGNOSIS — Z6821 Body mass index (BMI) 21.0-21.9, adult: Secondary | ICD-10-CM | POA: Diagnosis not present

## 2017-07-30 DIAGNOSIS — J069 Acute upper respiratory infection, unspecified: Secondary | ICD-10-CM | POA: Diagnosis not present

## 2017-08-17 DIAGNOSIS — Z23 Encounter for immunization: Secondary | ICD-10-CM | POA: Diagnosis not present

## 2017-08-29 DIAGNOSIS — I270 Primary pulmonary hypertension: Secondary | ICD-10-CM | POA: Diagnosis not present

## 2017-08-29 DIAGNOSIS — I499 Cardiac arrhythmia, unspecified: Secondary | ICD-10-CM | POA: Diagnosis not present

## 2017-08-29 DIAGNOSIS — I10 Essential (primary) hypertension: Secondary | ICD-10-CM | POA: Diagnosis not present

## 2017-08-29 DIAGNOSIS — E039 Hypothyroidism, unspecified: Secondary | ICD-10-CM | POA: Diagnosis not present

## 2017-08-31 DIAGNOSIS — I499 Cardiac arrhythmia, unspecified: Secondary | ICD-10-CM | POA: Diagnosis not present

## 2017-08-31 DIAGNOSIS — R944 Abnormal results of kidney function studies: Secondary | ICD-10-CM | POA: Diagnosis not present

## 2017-08-31 DIAGNOSIS — I270 Primary pulmonary hypertension: Secondary | ICD-10-CM | POA: Diagnosis not present

## 2017-08-31 DIAGNOSIS — I10 Essential (primary) hypertension: Secondary | ICD-10-CM | POA: Diagnosis not present

## 2017-08-31 DIAGNOSIS — I739 Peripheral vascular disease, unspecified: Secondary | ICD-10-CM | POA: Diagnosis not present

## 2017-08-31 DIAGNOSIS — D509 Iron deficiency anemia, unspecified: Secondary | ICD-10-CM | POA: Diagnosis not present

## 2017-08-31 DIAGNOSIS — K31819 Angiodysplasia of stomach and duodenum without bleeding: Secondary | ICD-10-CM | POA: Diagnosis not present

## 2017-08-31 DIAGNOSIS — E039 Hypothyroidism, unspecified: Secondary | ICD-10-CM | POA: Diagnosis not present

## 2017-09-19 ENCOUNTER — Encounter (HOSPITAL_COMMUNITY): Payer: Medicare Other | Attending: Oncology

## 2017-09-19 DIAGNOSIS — D500 Iron deficiency anemia secondary to blood loss (chronic): Secondary | ICD-10-CM | POA: Diagnosis not present

## 2017-09-19 LAB — COMPREHENSIVE METABOLIC PANEL
ALK PHOS: 88 U/L (ref 38–126)
ALT: 24 U/L (ref 14–54)
ANION GAP: 5 (ref 5–15)
AST: 29 U/L (ref 15–41)
Albumin: 3 g/dL — ABNORMAL LOW (ref 3.5–5.0)
BUN: 17 mg/dL (ref 6–20)
CALCIUM: 9 mg/dL (ref 8.9–10.3)
CO2: 31 mmol/L (ref 22–32)
CREATININE: 0.88 mg/dL (ref 0.44–1.00)
Chloride: 98 mmol/L — ABNORMAL LOW (ref 101–111)
GFR, EST NON AFRICAN AMERICAN: 60 mL/min — AB (ref >60)
Glucose, Bld: 92 mg/dL (ref 65–99)
Potassium: 3.8 mmol/L (ref 3.5–5.1)
SODIUM: 134 mmol/L — AB (ref 135–145)
TOTAL PROTEIN: 6.3 g/dL — AB (ref 6.5–8.1)
Total Bilirubin: 0.6 mg/dL (ref 0.3–1.2)

## 2017-09-19 LAB — CBC WITH DIFFERENTIAL/PLATELET
Basophils Absolute: 0 10*3/uL (ref 0.0–0.1)
Basophils Relative: 1 %
EOS ABS: 0.1 10*3/uL (ref 0.0–0.7)
EOS PCT: 2 %
HCT: 43.9 % (ref 36.0–46.0)
HEMOGLOBIN: 14.2 g/dL (ref 12.0–15.0)
LYMPHS ABS: 0.6 10*3/uL — AB (ref 0.7–4.0)
LYMPHS PCT: 10 %
MCH: 31.1 pg (ref 26.0–34.0)
MCHC: 32.3 g/dL (ref 30.0–36.0)
MCV: 96.3 fL (ref 78.0–100.0)
MONOS PCT: 15 %
Monocytes Absolute: 0.9 10*3/uL (ref 0.1–1.0)
NEUTROS PCT: 74 %
Neutro Abs: 4.4 10*3/uL (ref 1.7–7.7)
Platelets: 230 10*3/uL (ref 150–400)
RBC: 4.56 MIL/uL (ref 3.87–5.11)
RDW: 14 % (ref 11.5–15.5)
WBC: 6 10*3/uL (ref 4.0–10.5)

## 2017-09-19 LAB — FERRITIN: Ferritin: 56 ng/mL (ref 11–307)

## 2017-09-19 LAB — IRON AND TIBC
IRON: 76 ug/dL (ref 28–170)
SATURATION RATIOS: 25 % (ref 10.4–31.8)
TIBC: 300 ug/dL (ref 250–450)
UIBC: 224 ug/dL

## 2017-10-10 ENCOUNTER — Telehealth (HOSPITAL_COMMUNITY): Payer: Self-pay

## 2017-10-10 NOTE — Telephone Encounter (Signed)
Patient called concerned that her ferritin was in the 50's last time (09/19/17). She wants to know if she needs Iron.

## 2017-10-10 NOTE — Telephone Encounter (Signed)
Reviewed labs with Dr. Talbert Cage. She states patient does not need iron at this time. Called and let patient know she did not need iron. She verbalized understanding.

## 2017-10-16 ENCOUNTER — Ambulatory Visit (INDEPENDENT_AMBULATORY_CARE_PROVIDER_SITE_OTHER): Payer: Medicare Other | Admitting: Internal Medicine

## 2017-10-16 DIAGNOSIS — M4716 Other spondylosis with myelopathy, lumbar region: Secondary | ICD-10-CM | POA: Diagnosis not present

## 2017-10-30 DIAGNOSIS — H9209 Otalgia, unspecified ear: Secondary | ICD-10-CM | POA: Diagnosis not present

## 2017-11-08 DIAGNOSIS — H6990 Unspecified Eustachian tube disorder, unspecified ear: Secondary | ICD-10-CM | POA: Diagnosis not present

## 2017-11-08 DIAGNOSIS — R0602 Shortness of breath: Secondary | ICD-10-CM | POA: Diagnosis not present

## 2017-11-08 DIAGNOSIS — R600 Localized edema: Secondary | ICD-10-CM | POA: Diagnosis not present

## 2017-11-08 DIAGNOSIS — K219 Gastro-esophageal reflux disease without esophagitis: Secondary | ICD-10-CM | POA: Diagnosis not present

## 2017-11-08 DIAGNOSIS — G4700 Insomnia, unspecified: Secondary | ICD-10-CM | POA: Diagnosis not present

## 2017-11-09 ENCOUNTER — Other Ambulatory Visit (HOSPITAL_COMMUNITY): Payer: Self-pay | Admitting: *Deleted

## 2017-11-09 DIAGNOSIS — D508 Other iron deficiency anemias: Secondary | ICD-10-CM

## 2017-11-12 ENCOUNTER — Encounter (HOSPITAL_COMMUNITY): Payer: Medicare Other | Attending: Oncology

## 2017-11-12 DIAGNOSIS — D508 Other iron deficiency anemias: Secondary | ICD-10-CM | POA: Insufficient documentation

## 2017-11-12 LAB — COMPREHENSIVE METABOLIC PANEL
ALBUMIN: 3 g/dL — AB (ref 3.5–5.0)
ALT: 17 U/L (ref 14–54)
ANION GAP: 11 (ref 5–15)
AST: 23 U/L (ref 15–41)
Alkaline Phosphatase: 89 U/L (ref 38–126)
BUN: 15 mg/dL (ref 6–20)
CO2: 29 mmol/L (ref 22–32)
Calcium: 8.9 mg/dL (ref 8.9–10.3)
Chloride: 96 mmol/L — ABNORMAL LOW (ref 101–111)
Creatinine, Ser: 0.95 mg/dL (ref 0.44–1.00)
GFR calc non Af Amer: 54 mL/min — ABNORMAL LOW (ref >60)
GLUCOSE: 62 mg/dL — AB (ref 65–99)
POTASSIUM: 3.7 mmol/L (ref 3.5–5.1)
SODIUM: 136 mmol/L (ref 135–145)
TOTAL PROTEIN: 6.5 g/dL (ref 6.5–8.1)
Total Bilirubin: 0.5 mg/dL (ref 0.3–1.2)

## 2017-11-12 LAB — CBC WITH DIFFERENTIAL/PLATELET
BASOS PCT: 1 %
Basophils Absolute: 0.1 10*3/uL (ref 0.0–0.1)
EOS ABS: 0.2 10*3/uL (ref 0.0–0.7)
EOS PCT: 3 %
HCT: 44.6 % (ref 36.0–46.0)
Hemoglobin: 14 g/dL (ref 12.0–15.0)
Lymphocytes Relative: 9 %
Lymphs Abs: 0.6 10*3/uL — ABNORMAL LOW (ref 0.7–4.0)
MCH: 30.8 pg (ref 26.0–34.0)
MCHC: 31.4 g/dL (ref 30.0–36.0)
MCV: 98.2 fL (ref 78.0–100.0)
MONO ABS: 1.1 10*3/uL — AB (ref 0.1–1.0)
MONOS PCT: 16 %
NEUTROS PCT: 71 %
Neutro Abs: 4.9 10*3/uL (ref 1.7–7.7)
Platelets: 315 10*3/uL (ref 150–400)
RBC: 4.54 MIL/uL (ref 3.87–5.11)
RDW: 14.4 % (ref 11.5–15.5)
WBC: 6.9 10*3/uL (ref 4.0–10.5)

## 2017-11-12 LAB — FERRITIN: Ferritin: 53 ng/mL (ref 11–307)

## 2017-11-12 LAB — IRON AND TIBC
IRON: 75 ug/dL (ref 28–170)
Saturation Ratios: 26 % (ref 10.4–31.8)
TIBC: 294 ug/dL (ref 250–450)
UIBC: 219 ug/dL

## 2017-11-14 ENCOUNTER — Encounter (HOSPITAL_COMMUNITY): Payer: Self-pay | Admitting: *Deleted

## 2017-11-16 ENCOUNTER — Inpatient Hospital Stay (HOSPITAL_COMMUNITY): Payer: Medicare Other

## 2017-11-16 ENCOUNTER — Ambulatory Visit (HOSPITAL_COMMUNITY): Payer: Medicare Other | Admitting: Oncology

## 2017-11-16 ENCOUNTER — Inpatient Hospital Stay (HOSPITAL_COMMUNITY): Payer: Medicare Other | Attending: Oncology | Admitting: Oncology

## 2017-11-16 ENCOUNTER — Other Ambulatory Visit: Payer: Self-pay

## 2017-11-16 ENCOUNTER — Encounter (HOSPITAL_COMMUNITY): Payer: Medicare Other

## 2017-11-16 ENCOUNTER — Encounter (HOSPITAL_COMMUNITY): Payer: Self-pay | Admitting: Oncology

## 2017-11-16 ENCOUNTER — Inpatient Hospital Stay (HOSPITAL_BASED_OUTPATIENT_CLINIC_OR_DEPARTMENT_OTHER): Payer: Medicare Other

## 2017-11-16 VITALS — BP 163/58 | HR 67 | Temp 97.8°F | Resp 20 | Ht 61.0 in | Wt 117.0 lb

## 2017-11-16 VITALS — BP 182/72 | HR 64 | Temp 97.4°F | Resp 18

## 2017-11-16 DIAGNOSIS — D500 Iron deficiency anemia secondary to blood loss (chronic): Secondary | ICD-10-CM

## 2017-11-16 DIAGNOSIS — K219 Gastro-esophageal reflux disease without esophagitis: Secondary | ICD-10-CM | POA: Diagnosis not present

## 2017-11-16 DIAGNOSIS — F329 Major depressive disorder, single episode, unspecified: Secondary | ICD-10-CM

## 2017-11-16 DIAGNOSIS — K31819 Angiodysplasia of stomach and duodenum without bleeding: Secondary | ICD-10-CM | POA: Diagnosis not present

## 2017-11-16 DIAGNOSIS — H65191 Other acute nonsuppurative otitis media, right ear: Secondary | ICD-10-CM | POA: Diagnosis not present

## 2017-11-16 DIAGNOSIS — E039 Hypothyroidism, unspecified: Secondary | ICD-10-CM | POA: Diagnosis not present

## 2017-11-16 DIAGNOSIS — I10 Essential (primary) hypertension: Secondary | ICD-10-CM | POA: Diagnosis not present

## 2017-11-16 DIAGNOSIS — S81811A Laceration without foreign body, right lower leg, initial encounter: Secondary | ICD-10-CM

## 2017-11-16 MED ORDER — PREDNISONE 20 MG PO TABS: 20.0000 mg | ORAL_TABLET | Freq: Every day | ORAL | 0 refills | Status: DC

## 2017-11-16 MED ORDER — SODIUM CHLORIDE 0.9 % IV SOLN
750.0000 mg | Freq: Once | INTRAVENOUS | Status: AC
  Administered 2017-11-16: 750 mg via INTRAVENOUS
  Filled 2017-11-16: qty 15

## 2017-11-16 MED ORDER — SODIUM CHLORIDE 0.9 % IV SOLN
INTRAVENOUS | Status: AC
  Administered 2017-11-16: 10:00:00 via INTRAVENOUS

## 2017-11-16 NOTE — Patient Instructions (Signed)
Labs performed on 11/12/2017 demonstrated iron deficiency.  We will replace your iron through the IV. For your skin lesion is healing nicely.  No sign of infection. For your right ear, keep your appointment with Dr. Benjamine Mola.  I will try a prednisone pulse to see if that can help with the fluid behind your ear. Nutritionist will visit you today regarding your weight. For your bowels, you can try senokot-S.  This is over the counter. You can take 1-2 tablets one or twice per day. Return for blood work as directed.   [facility] at [facility] Discharge Instructions  RECOMMENDATIONS MADE BY THE CONSULTANT AND ANY TEST RESULTS WILL BE SENT TO YOUR REFERRING PHYSICIAN.   Thank you for choosing [facility] at [facility] to provide your oncology and hematology care.  To afford each patient quality time with our provider, please arrive at least 15 minutes before your scheduled appointment time.    If you have a lab appointment with the [facility] please come in thru the  Main Entrance and check in at the main information desk  You need to re-schedule your appointment should you arrive 10 or more minutes late.  We strive to give you quality time with our providers, and arriving late affects you and other patients whose appointments are after yours.  Also, if you no show three or more times for appointments you may be dismissed from the clinic at the providers discretion.     Again, thank you for choosing [facility]  Our hope is that these requests will decrease the amount of time that you wait before being seen by our physicians.       _____________________________________________________________  Should you have questions after your visit to [facility], please contact our office at [phone] between the hours of 8:30 a.m. and 4:30 p.m.  Voicemails left after 4:30 p.m. will not be returned until the following business day.   For prescription refill requests, have your pharmacy contact our office.       Resources For Cancer Patients and their Caregivers ? American Cancer Society: Can assist with transportation, wigs, general needs, runs Look Good Feel Better.        619 655 5705 ? Cancer Care: Provides financial assistance, online support groups, medication/co-pay assistance.  1-800-813-HOPE 2165133816) ? [facility] Assists [place] cancer patients and their families through emotional , educational and financial support.  226 328 3257 ? [place] DSS Where to apply for food stamps, Medicaid and utility assistance. 204-183-6130 ? RCATS: Transportation to medical appointments. 3031186797 ? Social Security Administration: May apply for disability if have a Stage IV cancer. (561) 553-9381 250-435-2536 ? [employer], Disability and Transit Services: Assists with nutrition, care and transit needs. [place] Support Programs: @10RELATIVEDAYS @ > Cancer Support Group  2nd Tuesday of the month 1pm-2pm, Journey Room  > Creative Journey  3rd Tuesday of the month 1130am-1pm, Journey Room  > Look Good Feel Better  1st Wednesday of the month 10am-12 noon, Journey Room (Call [facility] to register [phone])

## 2017-11-16 NOTE — Progress Notes (Signed)
Nutrition Follow-up:  Patient with anemia from gastric antral vascular ectasia syndrome.  Planning IV iron replacement today.    Met with patient today following MD visit.  Patient reports that she lost her husband of 22 years on November 11th.  She has been mourning his loss and appetite has been effected.  Reports that she feels like she is eating more at this time.  Reports that she is trying to eat 3 meals per day, drinking about 1 ensure plus per day sometimes 2.  Reports that her teeth are still an issue, "they are too big in my mouth."  "I take them out most of the time."  Has trouble chewing foods.    Medications: reviewed  Labs: reviewed  Anthropometrics:   Weight stable today at 117 lb especially with recent loss of husband. Noted weight 118 lb on 9/7 and 115 lb on 6/15.   NUTRITION DIAGNOSIS: Inadequate oral intake improving   MALNUTRITION DIAGNOSIS: continue to monitor   INTERVENTION:   Encouraged patient to increase ensure plus back to 2-3 per day for added calories and protein Reviewed foods high in calories and protein for patient to choose more often.  Talked about ways patient can add calories to foods she is already eating. Patient wants to hold off on getting 3rd ensure plus case today as she has some at home that grand-daughter gave her.   Has contact information.    MONITORING, EVALUATION, GOAL: weight trends, intake   NEXT VISIT: May 3rd following labs  Tanya Marvin B. Zenia Resides, [place], [place] Registered Dietitian [phone] (pager)

## 2017-11-16 NOTE — Progress Notes (Signed)
Mary Squibb, MD [address] [place] [address]  Iron deficiency anemia due to chronic blood loss - Plan: CBC with Differential, Iron and TIBC, Ferritin, Comprehensive metabolic panel  GAVE (gastric antral vascular ectasia) - Plan: CBC with Differential, Iron and TIBC, Ferritin  Gastroesophageal reflux disease without esophagitis  Essential hypertension  Hypothyroidism, unspecified type  Acute effusion of right ear  Noninfected skin tear of leg, right, initial encounter  Reactive depression  CURRENT THERAPY: IV iron when indicated to maintain a ferritin of 100 or greater  INTERVAL HISTORY: [name] 84 y.o. female returns for followup of iron deficiency anemia secondary to chronic GI blood loss from GAVE syndrome.   HPI Elements   Location: Blood  Quality: Iron deficiency   Severity: Controlled, mild  Duration: Since at least 2013  Context: Chronic GI blood loss  Timing:   Modifying Factors: GAVE syndrome  Associated Signs & Symptoms:    From an iron deficiency standpoint, laboratory work on 11/12/2017 demonstrated iron deficiency.  She is scheduled for IV iron replacement therapy.  She notes some mild fatigue and tiredness.  Unfortunately, the patient's experienced a recent family death with her husband passing away on September 23, 2017, secondary to renal failure.  She is coping with this loss in her life but it is difficult.  She reports an injury to her right lower extremity with 2 skin tears.  Both are healing nicely with some mild erythema.  No heat is noted.  Erythema is limited to immediate surrounding of injury site.  No discharge.  She reports a decrease in hearing in her right ear secondary to "fluid behind the eardrum".  She has made an appointment with ENT and this is scheduled later this month.  She is encouraged to keep this appointment.  She denies any fevers or chills.  She denies any right ear pain.  Review of Systems    Constitutional: Positive for weight loss. Negative for chills and fever.  HENT: Positive for hearing loss (right). Negative for congestion, sinus pain and sore throat.   Eyes: Negative.  Negative for blurred vision.  Respiratory: Negative.  Negative for cough.   Cardiovascular: Negative.  Negative for chest pain.  Gastrointestinal: Negative.  Negative for blood in stool, constipation, diarrhea, melena, nausea and vomiting.  Genitourinary: Negative.   Musculoskeletal: Negative.  Negative for falls.  Skin: Negative.        Skin tear  Neurological: Negative.  Negative for weakness.  Endo/Heme/Allergies: Negative.  Does not bruise/bleed easily.  Psychiatric/Behavioral: Positive for depression (reactive to husband's passing).    Past Medical History:  Diagnosis Date  . Anemia   . Chronic bronchitis ([place])   . GAVE (gastric antral vascular ectasia) 09/12/12  . Hypertension   . Hypothyroidism   . Iron deficiency anemia 10/15/2012  . Iron deficiency anemia due to chronic blood loss 04/20/2014  . On home oxygen therapy    "2L/[place] at night" (11/07/2016)  . Palpitations   . Pneumonia 06/2016; 10/2016  . Syncope and collapse 11/06/2016    Past Surgical History:  Procedure Laterality Date  . BACK SURGERY    . BIOPSY  12/27/2016   Procedure: BIOPSY;  Surgeon: Rogene Houston, MD;  [facility];  Service: Endoscopy;;  gastric  . COLONOSCOPY  07/24/2012   Procedure: COLONOSCOPY;  Surgeon: Rogene Houston, MD;  [facility];  Service: Endoscopy;  Laterality: N/A;  200  .  DILATION AND CURETTAGE OF UTERUS    . ESOPHAGOGASTRODUODENOSCOPY  09/12/2012   Procedure: ESOPHAGOGASTRODUODENOSCOPY (EGD);  Surgeon: Rogene Houston, MD;  [facility];  Service: Endoscopy;  Laterality: N/A;  325-changed to 200 per Ann-pt moved up to [place] to notify pt  . ESOPHAGOGASTRODUODENOSCOPY  11/01/2012   Procedure: ESOPHAGOGASTRODUODENOSCOPY (EGD);  Surgeon: Rogene Houston, MD;   [facility];  Service: Endoscopy;  Laterality: N/A;  1:20  . ESOPHAGOGASTRODUODENOSCOPY N/A 07/04/2013   Procedure: ESOPHAGOGASTRODUODENOSCOPY (EGD);  Surgeon: Rogene Houston, MD;  [facility];  Service: Endoscopy;  Laterality: N/A;  850  . ESOPHAGOGASTRODUODENOSCOPY N/A 08/06/2013   Procedure: ESOPHAGOGASTRODUODENOSCOPY (EGD);  Surgeon: Rogene Houston, MD;  [facility];  Service: Endoscopy;  Laterality: N/A;  1200  . ESOPHAGOGASTRODUODENOSCOPY N/A 10/31/2013   Procedure: ESOPHAGOGASTRODUODENOSCOPY (EGD);  Surgeon: Rogene Houston, MD;  [facility];  Service: Endoscopy;  Laterality: N/A;  125-moved to [place] notified pt  . ESOPHAGOGASTRODUODENOSCOPY N/A 10/23/2014   Procedure: ESOPHAGOGASTRODUODENOSCOPY (EGD);  Surgeon: Rogene Houston, MD;  [facility];  Service: Endoscopy;  Laterality: N/A;  1020  . ESOPHAGOGASTRODUODENOSCOPY N/A 10/11/2016   Procedure: ESOPHAGOGASTRODUODENOSCOPY (EGD);  Surgeon: Rogene Houston, MD;  [facility];  Service: Endoscopy;  Laterality: N/A;  245  . ESOPHAGOGASTRODUODENOSCOPY N/A 12/27/2016   Procedure: ESOPHAGOGASTRODUODENOSCOPY (EGD);  Surgeon: Rogene Houston, MD;  [facility];  Service: Endoscopy;  Laterality: N/A;  1225  . HEEL SPUR SURGERY Bilateral   . HOT HEMOSTASIS  11/01/2012   Procedure: HOT HEMOSTASIS (ARGON PLASMA COAGULATION/BICAP);  Surgeon: Rogene Houston, MD;  [facility];  Service: Endoscopy;  Laterality: N/A;  . HOT HEMOSTASIS N/A 07/04/2013   Procedure: HOT HEMOSTASIS (ARGON PLASMA COAGULATION/BICAP);  Surgeon: Rogene Houston, MD;  [facility];  Service: Endoscopy;  Laterality: N/A;  . HOT HEMOSTASIS N/A 08/06/2013   Procedure: HOT HEMOSTASIS (ARGON PLASMA COAGULATION/BICAP);  Surgeon: Rogene Houston, MD;  [facility];  Service: Endoscopy;  Laterality: N/A;  . HOT HEMOSTASIS N/A 10/31/2013   Procedure: HOT HEMOSTASIS (ARGON  PLASMA COAGULATION/BICAP);  Surgeon: Rogene Houston, MD;  [facility];  Service: Endoscopy;  Laterality: N/A;  . HOT HEMOSTASIS N/A 10/23/2014   Procedure: HOT HEMOSTASIS (ARGON PLASMA COAGULATION/BICAP);  Surgeon: Rogene Houston, MD;  [facility];  Service: Endoscopy;  Laterality: N/A;  . HOT HEMOSTASIS N/A 10/11/2016   Procedure: HOT HEMOSTASIS (ARGON PLASMA COAGULATION/BICAP);  Surgeon: Rogene Houston, MD;  [facility];  Service: Endoscopy;  Laterality: N/A;  . JOINT REPLACEMENT    . KNEE ARTHROSCOPY Right    "before replacement"  . LUMBAR DISC SURGERY     "bone spur  . LUMBAR FUSION  06/25/2014   L4 & L5  . SHOULDER SURGERY Right    "spur"  . TONSILLECTOMY AND ADENOIDECTOMY    . TOTAL ABDOMINAL HYSTERECTOMY    . TOTAL KNEE ARTHROPLASTY Right   . US ECHOCARDIOGRAPHY  03/12/2006   EF 55-60%    Family History  Problem Relation Age of Onset  . Hypertension Mother   . Hypertension Father   . Heart attack Father   . Other Sister        aneurysm on heart  . Other Brother        aneurysm on heart  . Breast cancer Neg Hx     Social History   Socioeconomic History  .  Marital status: Married    Spouse name: None  . Number of children: None  . Years of education: None  . Highest education level: None  Social Needs  . Financial resource strain: None  . Food insecurity - worry: None  . Food insecurity - inability: None  . Transportation needs - medical: None  . Transportation needs - non-medical: None  Occupational History  . None  Tobacco Use  . Smoking status: Never Smoker  . Smokeless tobacco: Never Used  Substance and Sexual Activity  . Alcohol use: No  . Drug use: No  . Sexual activity: No  Other Topics Concern  . None  Social History Narrative  . None     PHYSICAL EXAMINATION  ECOG PERFORMANCE STATUS: 1 - Symptomatic but completely ambulatory  Vitals:   11/16/17 1009  BP: (!) 163/58  Pulse: 67  Resp: 20  Temp:  97.8 F (36.6 C)  SpO2: 94%    Blood pressure today is 163/58 Pulse is 67 Respirations 20 Temperature 97.46F Oxygen saturation 94% Weight 117 pounds  GENERAL:alert, no distress, well developed, comfortable, cooperative and smiling SKIN: skin color, texture, turgor are normal, positive for: Right lower extremity skin tear x2 with minimal erythema and no abnormal discharge.  Skin tear is healing nicely. HEAD: Normocephalic, No masses, lesions, tenderness or abnormalities EYES: normal, EOMI, Conjunctiva are pink and non-injected EARS: R TM amber colored and bulging, L TM  normal OROPHARYNX:lips, buccal mucosa, and tongue normal and mucous membranes are moist  NECK: supple, no adenopathy, trachea midline LYMPH:  no palpable lymphadenopathy BREAST:not examined LUNGS: clear to auscultation  HEART: regular rate & rhythm ABDOMEN:abdomen soft and normal bowel sounds BACK: Back symmetric, no curvature. EXTREMITIES:less then 2 second capillary refill, no joint deformities, effusion, or inflammation, no skin discoloration, no cyanosis, positive findings:  edema B/L pedal edema  NEURO: alert & oriented x 3 with fluent speech, no focal motor/sensory deficits, gait normal   LABORATORY DATA: CBC    Component Value Date/Time   WBC 6.9 11/12/2017 1025   RBC 4.54 11/12/2017 1025   HGB 14.0 11/12/2017 1025   HCT 44.6 11/12/2017 1025   PLT 315 11/12/2017 1025   MCV 98.2 11/12/2017 1025   MCH 30.8 11/12/2017 1025   MCHC 31.4 11/12/2017 1025   RDW 14.4 11/12/2017 1025   LYMPHSABS 0.6 (L) 11/12/2017 1025   MONOABS 1.1 (H) 11/12/2017 1025   EOSABS 0.2 11/12/2017 1025   BASOSABS 0.1 11/12/2017 1025      Chemistry      Component Value Date/Time   NA 136 11/12/2017 1025   K 3.7 11/12/2017 1025   CL 96 (L) 11/12/2017 1025   CO2 29 11/12/2017 1025   BUN 15 11/12/2017 1025   CREATININE 0.95 11/12/2017 1025   CREATININE 1.07 (H) 09/16/2015 0855      Component Value Date/Time   CALCIUM 8.9  11/12/2017 1025   ALKPHOS 89 11/12/2017 1025   AST 23 11/12/2017 1025   ALT 17 11/12/2017 1025   BILITOT 0.5 11/12/2017 1025     Lab Results  Component Value Date   IRON 75 11/12/2017   TIBC 294 11/12/2017   FERRITIN 53 11/12/2017      PENDING LABS:   RADIOGRAPHIC STUDIES:  No results found.   PATHOLOGY:    ASSESSMENT AND PLAN:  GAVE (gastric antral vascular ectasia) Iron deficiency anemia secondary to GAVE syndrome with resultant chronic GI blood loss, having required IV iron replacement therapy.   Labs on 11/12/2018  reviewed: CBC diff, CMET, iron/TIBC, ferritin.  I personally reviewed and went over laboratory results with the patient.  The results are noted within this dictation.  Blood counts on 11/12/2018 is good with normal hemoglobin.  Iron studies demonstrate a ferritin less than 100 at 53.  Will set-up patient for 1 dose of Injectafer to maintain ferritin of 100 or greater.  Supportive therapy plan is built.   Labs every 8 weeks: CBC diff, iron/TIBC, ferritin. Labs in 6 months: CMET.  Return in 6 months for follow-up.  1. Iron deficiency anemia due to chronic blood loss Secondary to GAVE syndrome and chronic GI blood loss - CBC with Differential; Standing - Iron and TIBC; Standing - Ferritin; Standing - Comprehensive metabolic panel; Future  3. Gastroesophageal reflux disease without esophagitis Stable  4. Essential hypertension Above goal today at 163/58.  She will discuss this with her primary care provider and cardiologist  5. Hypothyroidism, unspecified type On levothyroxine replacement.  6. Acute effusion of right ear Noted on examination and patient complains of decreased hearing on right side.  She has tried antihistamines.  She has an appointment with ENT later this month.  In the interim, I will attempt a prednisone pulse with an attempt to decrease inflammation and assist with fluid drainage.  Prednisone 20 mg daily with breakfast as  prescribed and prescription is printed for the patient.  She is advised to maintain her ENT appointment even if symptoms are better/resolved.  7. Noninfected skin tear of leg, right, initial encounter Of right lower extremity, healing nicely.  No signs/symptoms of infection.  8. Reactive depression Secondary to the passing of her husband in November 2018.  Coping well at this time.  No antidepressant intervention recommended at this time.  9. Weight loss Stable at 117 pounds today.  Nutritionist is visiting with the patient today in follow-up.    Final Result of Complexity      Choose decision making level with 2 or 3 checks OR choose the decision making level on Section B       A Number of diagnoses or treatment options  []   </= 1 Minimal  []   2 Limited  []   3 Multiple  [x]   >/= 4 Extensive  B Amount and complexity of data  []   </= 1 Minimal or low  []   2 Limited  [x]   3  Moderate  []   >/= 4 Extensive  C Highest risk  []   Minimal  []   Low  [x]   Moderate  []   High   Type of decision making  []   Straight-forward  []   Low Complexity  [x]   Moderate- Complexity  []   High- Complexity     ORDERS PLACED FOR THIS ENCOUNTER: Orders Placed This Encounter  Procedures  . CBC with Differential  . Iron and TIBC  . Ferritin  . Comprehensive metabolic panel    MEDICATIONS PRESCRIBED THIS ENCOUNTER: Meds ordered this encounter  Medications  . DISCONTD: predniSONE (DELTASONE) 20 MG tablet    Sig: Take 1 tablet (20 mg total) by mouth daily with breakfast.    Dispense:  5 tablet    Refill:  0    Order Specific Question:   Supervising Provider    Answer:   Brunetta Genera [[id number]]  . predniSONE (DELTASONE) 20 MG tablet    Sig: Take 1 tablet (20 mg total) by mouth daily with breakfast.    Dispense:  5 tablet    Refill:  0  Order Specific Question:   Supervising Provider    Answer:   Brunetta Genera [[id number]]    THERAPY PLAN:  Continue with IV iron  replacement therapy when indicated to maintain a ferritin of 100 or greater.  All questions were answered. The patient knows to call the clinic with any problems, questions or concerns. We can certainly see the patient much sooner if necessary.  Patient and plan discussed with Dr. Twana First and she is in agreement with the aforementioned.   This note is electronically signed by: Robynn Pane, PA-C 11/16/2017 10:14 AM

## 2017-11-16 NOTE — Assessment & Plan Note (Signed)
Iron deficiency anemia secondary to GAVE syndrome with resultant chronic GI blood loss, having required IV iron replacement therapy.   Labs on 11/12/2018 reviewed: CBC diff, CMET, iron/TIBC, ferritin.  I personally reviewed and went over laboratory results with the patient.  The results are noted within this dictation.  Blood counts on 11/12/2018 is good with normal hemoglobin.  Iron studies demonstrate a ferritin less than 100 at 53.  Will set-up patient for 1 dose of Injectafer to maintain ferritin of 100 or greater.  Supportive therapy plan is built.   Labs every 8 weeks: CBC diff, iron/TIBC, ferritin. Labs in 6 months: CMET.  Return in 6 months for follow-up.

## 2017-11-19 DIAGNOSIS — R0602 Shortness of breath: Secondary | ICD-10-CM | POA: Diagnosis not present

## 2017-11-19 DIAGNOSIS — R0902 Hypoxemia: Secondary | ICD-10-CM | POA: Diagnosis not present

## 2017-11-21 ENCOUNTER — Other Ambulatory Visit (HOSPITAL_COMMUNITY): Payer: Self-pay | Admitting: Adult Health Nurse Practitioner

## 2017-11-21 DIAGNOSIS — R0602 Shortness of breath: Secondary | ICD-10-CM

## 2017-11-23 DIAGNOSIS — R0902 Hypoxemia: Secondary | ICD-10-CM | POA: Diagnosis not present

## 2017-11-26 ENCOUNTER — Encounter (INDEPENDENT_AMBULATORY_CARE_PROVIDER_SITE_OTHER): Payer: Self-pay | Admitting: Internal Medicine

## 2017-11-26 ENCOUNTER — Ambulatory Visit (INDEPENDENT_AMBULATORY_CARE_PROVIDER_SITE_OTHER): Payer: Medicare Other | Admitting: Internal Medicine

## 2017-11-26 ENCOUNTER — Encounter (INDEPENDENT_AMBULATORY_CARE_PROVIDER_SITE_OTHER): Payer: Self-pay | Admitting: *Deleted

## 2017-11-26 VITALS — BP 140/80 | HR 66 | Temp 97.0°F | Resp 18 | Ht 61.0 in | Wt 114.9 lb

## 2017-11-26 DIAGNOSIS — R634 Abnormal weight loss: Secondary | ICD-10-CM

## 2017-11-26 DIAGNOSIS — K31819 Angiodysplasia of stomach and duodenum without bleeding: Secondary | ICD-10-CM

## 2017-11-26 DIAGNOSIS — R0989 Other specified symptoms and signs involving the circulatory and respiratory systems: Secondary | ICD-10-CM | POA: Diagnosis not present

## 2017-11-26 NOTE — Patient Instructions (Addendum)
Physician will call with results of ultrasound when completed. weight check in 4-6 weeks. Take Senokot on scheduled rather than as needed.

## 2017-11-26 NOTE — Progress Notes (Signed)
Presenting complaint;  Follow-up for GAVE  and weight loss.  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  She has a history of iron deficiency anemia secondary to chronic GI blood loss from gastric antral vascular ectasia and has undergone multiple APC ablations last 1 of which was in February 2018.  She has been receiving parenteral iron via oncology clinic on as needed basis.  She says her hemoglobin 2 weeks ago was normal.  She has not experienced melena recently.  She has been under a lot of stress.  She lost her husband a few weeks ago.  She lives alone but both her children see her virtually every day and provide her with meals etc. She continues to lose weight.  She has lost another 4 pounds in the last 7 months.  Her daughter stated that she is eating better and she is also drinking 2 cans of Ensure daily.  She is experiencing dyspnea with minimal exertion.  She has been felt to have CHF.  She is scheduled for echocardiography in the near future.  She has intermittent burping but she denies heartburn nausea or vomiting.  She also complains of feeling dizzy and has buzzing in her ears.  She is scheduled to see Dr. Benjamine Mola later this week.   Current Medications: Outpatient Encounter Medications as of 11/26/2017  Medication Sig  . albuterol (PROVENTIL) (2.5 MG/3ML) 0.083% nebulizer solution Take 2.5 mg by nebulization every 6 (six) hours as needed for wheezing or shortness of breath.  . digoxin (DIGOX) 0.125 MG tablet Take 1 tablet mouth daily except none on Sundays or as directed  . feeding supplement, ENSURE ENLIVE, (ENSURE ENLIVE) LIQD Take 237 mLs by mouth 2 (two) times daily between meals.  . furosemide (LASIX) 20 MG tablet Take 20 mg by mouth daily.   . hydrALAZINE (APRESOLINE) 10 MG tablet Take 1 tablet (10 mg total) by mouth 3 (three) times daily.  Marland Kitchen levothyroxine (SYNTHROID, LEVOTHROID) 112 MCG tablet  Take 112 mcg by mouth daily before breakfast.  . lidocaine (XYLOCAINE) 5 % ointment Apply 1 application topically as needed.  . metoprolol tartrate (LOPRESSOR) 25 MG tablet Take 1 tablet (25 mg total) by mouth daily.  . pantoprazole (PROTONIX) 40 MG tablet TK 1 T PO D  . potassium chloride (K-DUR) 10 MEQ tablet Take 10 mEq by mouth daily.   Marland Kitchen senna (SENOKOT) 8.6 MG tablet Take 1 tablet by mouth as needed for constipation.  . [DISCONTINUED] polyethylene glycol powder (GLYCOLAX/MIRALAX) powder Take 17 g by mouth daily as needed for moderate constipation.   . [DISCONTINUED] predniSONE (DELTASONE) 20 MG tablet Take 1 tablet (20 mg total) by mouth daily with breakfast. (Patient not taking: Reported on 11/16/2017)   Facility-Administered Encounter Medications as of 11/26/2017  Medication  . 0.9 %  sodium chloride infusion     Objective: Blood pressure 140/80, pulse 66, temperature (!) 97 F (36.1 C), temperature source Oral, resp. rate 18, height 5\' 1"  (1.549 m), weight 114 lb 14.4 oz (52.1 kg). Well-developed thin Caucasian female in no acute distress. Conjunctiva is pink. Sclera is nonicteric Oropharyngeal mucosa is normal. No neck masses or thyromegaly noted. Cardiac exam with regular rhythm normal S1 and S2.  Grade 2/6 systolic ejection murmur best heard at left sternal border. Lungs are clear to auscultation. Abdomen is flat.  Bowel sounds are normal.  No bruit noted.  Abdominal aorta is easily palpable and somewhat expanded.  Nonpitting pretibial edema noted to both legs.  Labs/studies Results: Lab data from 11/12/2017 WBC 6.9, H&H 14 and 44.6 and platelet count 315K. Serum iron 76 TIBC 300 and saturation 25%. Serum ferritin 53   Assessment:  #1.  Gastric antral vascular ectasia resulting in iron deficiency anemia due to chronic blood loss.  She is requiring periodic parenteral iron.  Recent H&H normal and so her iron stores.  We will continue to watch.  She has undergone multiple APC  sessions with some success.  #2.  Weight loss.  Weight loss is secondary to diminished oral intake and it appears to be multifactorial.  I do not believe she has cardiac cachexia.  #3.  Palpable abdominal aorta.  Family history significant for thoracic aneurysm in  2 siblings.   Plan:  Abdominal ultrasound to examine abdominal aorta and also review liver and pancreas given her weight loss. Patient can takes 1 Senokot tablets daily or every other day as tolerated.  She should not waits to become constipated before she uses this medication. Weight check in 4-6 weeks. Office visit in 6 months.

## 2017-11-27 ENCOUNTER — Ambulatory Visit (HOSPITAL_COMMUNITY)
Admission: RE | Admit: 2017-11-27 | Discharge: 2017-11-27 | Disposition: A | Discharge status: Home / Self Care | Payer: Medicare Other | Source: Ambulatory Visit | Attending: Adult Health Nurse Practitioner | Admitting: Adult Health Nurse Practitioner

## 2017-11-27 DIAGNOSIS — K219 Gastro-esophageal reflux disease without esophagitis: Secondary | ICD-10-CM | POA: Insufficient documentation

## 2017-11-27 DIAGNOSIS — R0602 Shortness of breath: Secondary | ICD-10-CM | POA: Diagnosis not present

## 2017-11-27 DIAGNOSIS — R55 Syncope and collapse: Secondary | ICD-10-CM | POA: Insufficient documentation

## 2017-11-27 DIAGNOSIS — I082 Rheumatic disorders of both aortic and tricuspid valves: Secondary | ICD-10-CM | POA: Insufficient documentation

## 2017-11-27 DIAGNOSIS — R002 Palpitations: Secondary | ICD-10-CM | POA: Diagnosis not present

## 2017-11-27 DIAGNOSIS — I119 Hypertensive heart disease without heart failure: Secondary | ICD-10-CM | POA: Diagnosis not present

## 2017-11-27 LAB — ECHOCARDIOGRAM COMPLETE
AO mean calculated velocity dopler: 122 cm/s
AV Mean grad: 7 mmHg
AV Peak grad: 14 mmHg
AV peak Index: 1.14
AV pk vel: 184 cm/s
AV vel: 1.74
AVAREAMEANV: 1.64 cm2
AVAREAMEANVIN: 1.1 cm2/m2
AVAREAVTI: 1.71 cm2
AVAREAVTIIND: 1.16 cm2/m2
AVCELMEANRAT: 0.72
Ao pk vel: 0.76 m/s
CHL CUP AV VALUE AREA INDEX: 1.16
CHL CUP MV DEC (S): 211
CHL CUP STROKE VOLUME: 36 mL
EERAT: 10.16
EWDT: 211 ms
FS: 38 % (ref 28–44)
IV/PV OW: 1.12
LA ID, A-P, ES: 43 mm
LA diam index: 2.86 cm/m2
LA vol A4C: 52.8 ml
LA vol: 59.7 mL
LAVOLIN: 39.8 mL/m2
LDCA: 2.27 cm2
LEFT ATRIUM END SYS DIAM: 43 mm
LV E/e' medial: 10.16
LV E/e'average: 10.16
LV PW d: 9.54 mm — AB (ref 0.6–1.1)
LV dias vol: 55 mL (ref 46–106)
LV e' LATERAL: 8.27 cm/s
LV sys vol: 19 mL (ref 14–42)
LVDIAVOLIN: 37 mL/m2
LVOT SV: 67 mL
LVOT VTI: 29.4 cm
LVOT diameter: 17 mm
LVOT peak VTI: 0.77 cm
LVOT peak grad rest: 8 mmHg
LVOTPV: 139 cm/s
LVSYSVOLIN: 13 mL/m2
Lateral S' vel: 10.9 cm/s
MV Peak grad: 3 mmHg
MV pk A vel: 84.8 m/s
MVPKEVEL: 84 m/s
P 1/2 time: 513 ms
RV TAPSE: 20.7 mm
RV sys press: 26 mmHg
Reg peak vel: 241 cm/s
Simpson's disk: 65
TDI e' lateral: 8.27
TDI e' medial: 5.66
TRMAXVEL: 241 cm/s
VTI: 38.4 cm
Valve area: 1.74 cm2

## 2017-11-27 NOTE — Progress Notes (Signed)
*  PRELIMINARY RESULTS* Echocardiogram 2D Echocardiogram has been performed.  [name] 11/27/2017, 10:39 AM

## 2017-11-29 ENCOUNTER — Ambulatory Visit (INDEPENDENT_AMBULATORY_CARE_PROVIDER_SITE_OTHER): Payer: Medicare Other | Admitting: Otolaryngology

## 2017-11-29 DIAGNOSIS — H6981 Other specified disorders of Eustachian tube, right ear: Secondary | ICD-10-CM | POA: Diagnosis not present

## 2017-11-29 DIAGNOSIS — H9071 Mixed conductive and sensorineural hearing loss, unilateral, right ear, with unrestricted hearing on the contralateral side: Secondary | ICD-10-CM | POA: Diagnosis not present

## 2017-11-29 DIAGNOSIS — J310 Chronic rhinitis: Secondary | ICD-10-CM | POA: Diagnosis not present

## 2017-11-29 DIAGNOSIS — H6521 Chronic serous otitis media, right ear: Secondary | ICD-10-CM | POA: Diagnosis not present

## 2017-11-29 DIAGNOSIS — J343 Hypertrophy of nasal turbinates: Secondary | ICD-10-CM | POA: Diagnosis not present

## 2017-12-03 ENCOUNTER — Ambulatory Visit (HOSPITAL_COMMUNITY)
Admission: RE | Admit: 2017-12-03 | Discharge: 2017-12-03 | Disposition: A | Discharge status: Home / Self Care | Payer: Medicare Other | Source: Ambulatory Visit | Attending: Internal Medicine | Admitting: Internal Medicine

## 2017-12-03 DIAGNOSIS — R0989 Other specified symptoms and signs involving the circulatory and respiratory systems: Secondary | ICD-10-CM | POA: Insufficient documentation

## 2017-12-03 DIAGNOSIS — R634 Abnormal weight loss: Secondary | ICD-10-CM | POA: Insufficient documentation

## 2017-12-03 DIAGNOSIS — J90 Pleural effusion, not elsewhere classified: Secondary | ICD-10-CM | POA: Diagnosis not present

## 2017-12-03 DIAGNOSIS — N281 Cyst of kidney, acquired: Secondary | ICD-10-CM | POA: Diagnosis not present

## 2017-12-17 ENCOUNTER — Ambulatory Visit (INDEPENDENT_AMBULATORY_CARE_PROVIDER_SITE_OTHER): Payer: Medicare Other | Admitting: Physician Assistant

## 2017-12-17 ENCOUNTER — Encounter: Payer: Self-pay | Admitting: Physician Assistant

## 2017-12-17 VITALS — BP 152/74 | HR 68 | Ht 61.0 in | Wt 114.0 lb

## 2017-12-17 DIAGNOSIS — I10 Essential (primary) hypertension: Secondary | ICD-10-CM | POA: Diagnosis not present

## 2017-12-17 DIAGNOSIS — I491 Atrial premature depolarization: Secondary | ICD-10-CM | POA: Diagnosis not present

## 2017-12-17 DIAGNOSIS — I5032 Chronic diastolic (congestive) heart failure: Secondary | ICD-10-CM

## 2017-12-17 DIAGNOSIS — J90 Pleural effusion, not elsewhere classified: Secondary | ICD-10-CM | POA: Diagnosis not present

## 2017-12-17 LAB — BASIC METABOLIC PANEL
BUN/Creatinine Ratio: 19 (ref 12–28)
BUN: 15 mg/dL (ref 8–27)
CO2: 28 mmol/L (ref 20–29)
CREATININE: 0.78 mg/dL (ref 0.57–1.00)
Calcium: 9.1 mg/dL (ref 8.7–10.3)
Chloride: 90 mmol/L — ABNORMAL LOW (ref 96–106)
GFR calc Af Amer: 82 mL/min/{1.73_m2} (ref >59)
GFR, EST NON AFRICAN AMERICAN: 71 mL/min/{1.73_m2} (ref >59)
Glucose: 67 mg/dL (ref 65–99)
Potassium: 5.4 mmol/L — ABNORMAL HIGH (ref 3.5–5.2)
SODIUM: 130 mmol/L — AB (ref 134–144)

## 2017-12-17 MED ORDER — METOPROLOL TARTRATE 25 MG PO TABS
25.0000 mg | ORAL_TABLET | Freq: Two times a day (BID) | ORAL | 5 refills | Status: DC
Start: 2017-12-17 — End: 2018-07-09

## 2017-12-17 MED ORDER — HYDRALAZINE HCL 10 MG PO TABS: 10.0000 mg | ORAL_TABLET | Freq: Two times a day (BID) | ORAL | 3 refills | Status: DC

## 2017-12-17 NOTE — Progress Notes (Signed)
Cardiology Office Note    Date:  12/19/2017   ID:  JAKIA KENNEBREW, DOB [birth year], MRN [id number]  PCP:  Celene Squibb, MD  Cardiologist:  Dr. Martinique   Chief Complaint  Patient presents with  . Follow-up    seen for Dr. Martinique.     History of Present Illness:  [name] is a 84 y.o. female with PMH of HTN, PACs, and syncope.  Myoview performed in February 2013 was negative.  EF 83%.  Echocardiogram showed normal LV function with moderate pulmonary hypertension.  She uses 2 L nasal cannula at night however does not need to use it during the day.  She has a history of gastric antral vascular ectasia.  She underwent upper EGD in November 2017 and had coagulation with argon plasma for bleeding prophylaxis.  Hemoglobin has been maintained with periodic iron infusion.  She had 2 witnessed syncope in December 2017.  Carotid Doppler obtained on 11/09/2016 showed 1-39% disease in bilateral ICA.  30 day Heart monitor at the time showed PACs however no significant ventricular ectopy.  She apparently was sick and was on antibiotic for pneumonia at the time.  Last echocardiogram obtained on 11/27/2017 showed EF 60-65%, grade 2 DD, mild LVH, moderate AI, mildly dilated left atrium.  Patient presents today for cardiology office visit.  Her blood pressure continue to be elevated, however according to patient, she only has been taking hydralazine twice daily dosing.  She cannot remember to take the third tablet.  Otherwise she is also on daily dosing of metoprolol tartrate, I will increase the metoprolol tartrate to twice daily.  Recent abdominal ultrasound did not show any aneurysm however did show a right pleural effusion.  This is consistent with my physical exam.  I will order a repeat chest x-ray in a week, if pleural effusion remain mild, I would recommend continue observation.  Otherwise her creatinine has been trending up on the current dose of diuretic.  I will order a repeat basic metabolic panel to  reassess.  She does not appears to be volume overloaded on physical exam.   Past Medical History:  Diagnosis Date  . Anemia   . Chronic bronchitis ([place])   . GAVE (gastric antral vascular ectasia) 09/12/12  . Hypertension   . Hypothyroidism   . Iron deficiency anemia 10/15/2012  . Iron deficiency anemia due to chronic blood loss 04/20/2014  . On home oxygen therapy    "2L/[place] at night" (11/07/2016)  . Palpitations   . Pneumonia 06/2016; 10/2016  . Syncope and collapse 11/06/2016    Past Surgical History:  Procedure Laterality Date  . BACK SURGERY    . BIOPSY  12/27/2016   Procedure: BIOPSY;  Surgeon: Rogene Houston, MD;  [facility];  Service: Endoscopy;;  gastric  . COLONOSCOPY  07/24/2012   Procedure: COLONOSCOPY;  Surgeon: Rogene Houston, MD;  [facility];  Service: Endoscopy;  Laterality: N/A;  200  . DILATION AND CURETTAGE OF UTERUS    . ESOPHAGOGASTRODUODENOSCOPY  09/12/2012   Procedure: ESOPHAGOGASTRODUODENOSCOPY (EGD);  Surgeon: Rogene Houston, MD;  [facility];  Service: Endoscopy;  Laterality: N/A;  325-changed to 200 per Ann-pt moved up to [place] to notify pt  . ESOPHAGOGASTRODUODENOSCOPY  11/01/2012   Procedure: ESOPHAGOGASTRODUODENOSCOPY (EGD);  Surgeon: Rogene Houston, MD;  [facility];  Service: Endoscopy;  Laterality: N/A;  1:20  . ESOPHAGOGASTRODUODENOSCOPY N/A 07/04/2013   Procedure: ESOPHAGOGASTRODUODENOSCOPY (EGD);  Surgeon: Rogene Houston, MD;  [facility];  Service: Endoscopy;  Laterality: N/A;  850  . ESOPHAGOGASTRODUODENOSCOPY N/A 08/06/2013   Procedure: ESOPHAGOGASTRODUODENOSCOPY (EGD);  Surgeon: Rogene Houston, MD;  [facility];  Service: Endoscopy;  Laterality: N/A;  1200  . ESOPHAGOGASTRODUODENOSCOPY N/A 10/31/2013   Procedure: ESOPHAGOGASTRODUODENOSCOPY (EGD);  Surgeon: Rogene Houston, MD;  [facility];  Service: Endoscopy;  Laterality: N/A;  125-moved to [place]  notified pt  . ESOPHAGOGASTRODUODENOSCOPY N/A 10/23/2014   Procedure: ESOPHAGOGASTRODUODENOSCOPY (EGD);  Surgeon: Rogene Houston, MD;  [facility];  Service: Endoscopy;  Laterality: N/A;  1020  . ESOPHAGOGASTRODUODENOSCOPY N/A 10/11/2016   Procedure: ESOPHAGOGASTRODUODENOSCOPY (EGD);  Surgeon: Rogene Houston, MD;  [facility];  Service: Endoscopy;  Laterality: N/A;  245  . ESOPHAGOGASTRODUODENOSCOPY N/A 12/27/2016   Procedure: ESOPHAGOGASTRODUODENOSCOPY (EGD);  Surgeon: Rogene Houston, MD;  [facility];  Service: Endoscopy;  Laterality: N/A;  1225  . HEEL SPUR SURGERY Bilateral   . HOT HEMOSTASIS  11/01/2012   Procedure: HOT HEMOSTASIS (ARGON PLASMA COAGULATION/BICAP);  Surgeon: Rogene Houston, MD;  [facility];  Service: Endoscopy;  Laterality: N/A;  . HOT HEMOSTASIS N/A 07/04/2013   Procedure: HOT HEMOSTASIS (ARGON PLASMA COAGULATION/BICAP);  Surgeon: Rogene Houston, MD;  [facility];  Service: Endoscopy;  Laterality: N/A;  . HOT HEMOSTASIS N/A 08/06/2013   Procedure: HOT HEMOSTASIS (ARGON PLASMA COAGULATION/BICAP);  Surgeon: Rogene Houston, MD;  [facility];  Service: Endoscopy;  Laterality: N/A;  . HOT HEMOSTASIS N/A 10/31/2013   Procedure: HOT HEMOSTASIS (ARGON PLASMA COAGULATION/BICAP);  Surgeon: Rogene Houston, MD;  [facility];  Service: Endoscopy;  Laterality: N/A;  . HOT HEMOSTASIS N/A 10/23/2014   Procedure: HOT HEMOSTASIS (ARGON PLASMA COAGULATION/BICAP);  Surgeon: Rogene Houston, MD;  [facility];  Service: Endoscopy;  Laterality: N/A;  . HOT HEMOSTASIS N/A 10/11/2016   Procedure: HOT HEMOSTASIS (ARGON PLASMA COAGULATION/BICAP);  Surgeon: Rogene Houston, MD;  [facility];  Service: Endoscopy;  Laterality: N/A;  . JOINT REPLACEMENT    . KNEE ARTHROSCOPY Right    "before replacement"  . LUMBAR DISC SURGERY     "bone spur  . LUMBAR FUSION  06/25/2014   L4 & L5  .  SHOULDER SURGERY Right    "spur"  . TONSILLECTOMY AND ADENOIDECTOMY    . TOTAL ABDOMINAL HYSTERECTOMY    . TOTAL KNEE ARTHROPLASTY Right   . US ECHOCARDIOGRAPHY  03/12/2006   EF 55-60%    Current Medications: Outpatient Medications Prior to Visit  Medication Sig Dispense Refill  . albuterol (PROVENTIL) (2.5 MG/3ML) 0.083% nebulizer solution Take 2.5 mg by nebulization every 6 (six) hours as needed for wheezing or shortness of breath.    . cyanocobalamin 1000 MCG tablet Take 1,000 mcg by mouth daily.    . digoxin (DIGOX) 0.125 MG tablet Take 1 tablet mouth daily except none on Sundays or as directed 30 tablet 5  . feeding supplement, ENSURE ENLIVE, (ENSURE ENLIVE) LIQD Take 237 mLs by mouth 2 (two) times daily between meals.    . furosemide (LASIX) 20 MG tablet Take 20 mg by mouth daily.     Marland Kitchen levothyroxine (SYNTHROID, LEVOTHROID) 112 MCG tablet Take 112 mcg by mouth daily before breakfast.    . lidocaine (XYLOCAINE) 5 % ointment Apply 1 application topically as needed. 35.44 g 0  . pantoprazole (PROTONIX) 40 MG tablet TK  1 T PO D  5  . potassium chloride (K-DUR) 10 MEQ tablet Take 10 mEq by mouth daily.     Marland Kitchen senna (SENOKOT) 8.6 MG tablet Take 1 tablet by mouth as needed for constipation.    . hydrALAZINE (APRESOLINE) 10 MG tablet Take 1 tablet (10 mg total) by mouth 3 (three) times daily. 270 tablet 3  . metoprolol tartrate (LOPRESSOR) 25 MG tablet Take 1 tablet (25 mg total) by mouth daily. 90 tablet 3   Facility-Administered Medications Prior to Visit  Medication Dose Route Frequency Provider Last Rate Last Dose  . 0.9 %  sodium chloride infusion   Intravenous Continuous [facility], PA-C 20 mL/hr at 11/16/17 1025       Allergies:   Flagyl [metronidazole]; Avapro [irbesartan]; Clarithromycin; Dexamethasone; Losartan potassium-hctz; Maxzide [hydrochlorothiazide w-triamterene]; Ziac [bisoprolol-hydrochlorothiazide]; Penicillins; and Sulfa drugs cross reactors   Social History    Socioeconomic History  . Marital status: Widowed    Spouse name: None  . Number of children: None  . Years of education: None  . Highest education level: None  Social Needs  . Financial resource strain: None  . Food insecurity - worry: None  . Food insecurity - inability: None  . Transportation needs - medical: None  . Transportation needs - non-medical: None  Occupational History  . None  Tobacco Use  . Smoking status: Never Smoker  . Smokeless tobacco: Never Used  Substance and Sexual Activity  . Alcohol use: No  . Drug use: No  . Sexual activity: No  Other Topics Concern  . None  Social History Narrative  . None     Family History:  The patient's family history includes Heart attack in her father; Hypertension in her father and mother; Other in her brother and sister.   ROS:   Please see the history of present illness.    ROS All other systems reviewed and are negative.   PHYSICAL EXAM:   VS:  BP (!) 152/74   Pulse 68   Ht 5\' 1"  (1.549 m)   Wt 114 lb (51.7 kg)   BMI 21.54 kg/m    GEN: Well nourished, well developed, in no acute distress  HEENT: normal  Neck: no JVD, carotid bruits, or masses Cardiac: RRR; no murmurs, rubs, or gallops,no edema  Respiratory: Decreased breath sounds in the right base, otherwise clear to auscultation GI: soft, nontender, nondistended, + BS MS: no deformity or atrophy  Skin: warm and dry, no rash Neuro:  Alert and Oriented x 3, Strength and sensation are intact Psych: euthymic mood, full affect  Wt Readings from Last 3 Encounters:  12/17/17 114 lb (51.7 kg)  11/26/17 114 lb 14.4 oz (52.1 kg)  11/16/17 117 lb (53.1 kg)      Studies/Labs Reviewed:   EKG:  EKG is ordered today.  The ekg ordered today demonstrates normal sinus rhythm without significant ST-T wave changes.  LVH.  Recent Labs: 11/12/2017: ALT 17; Hemoglobin 14.0; Platelets 315 12/17/2017: BUN 15; Creatinine, Ser 0.78; Potassium 5.4; Sodium 130   Lipid  Panel    Component Value Date/Time   CHOL 146 09/16/2015 0855   TRIG 106 09/16/2015 0855   HDL 48 09/16/2015 0855   CHOLHDL 3.0 09/16/2015 0855   VLDL 21 09/16/2015 0855   LDLCALC 77 09/16/2015 0855    Additional studies/ records that were reviewed today include:   Carotid US 11/09/2016 Summary:  - The vertebral arteries appear patent with antegrade flow. - Findings consistent with  1-39 percent stenosis involving the right internal carotid artery and the left internal carotid artery.   Echo 11/27/2017 LV EF: 60% -   65%  Study Conclusions  - Left ventricle: Wall thickness was increased in a pattern of mild   LVH. Systolic function was normal. The estimated ejection   fraction was in the range of 60% to 65%. Wall motion was normal;   there were no regional wall motion abnormalities. Features are   consistent with a pseudonormal left ventricular filling pattern,   with concomitant abnormal relaxation and increased filling   pressure (grade 2 diastolic dysfunction). Doppler parameters are   consistent with high ventricular filling pressure. - Aortic valve: Moderately calcified annulus. Trileaflet. Sclerosis   without stenosis. There was moderate regurgitation. - Mitral valve: Calcified annulus. Mildly thickened leaflets . - Left atrium: The atrium was mildly dilated. - Right atrium: The atrium was mildly dilated. - Tricuspid valve: There was mild regurgitation.    ASSESSMENT:    1. Chronic diastolic heart failure ([place])   2. Pleural effusion, right   3. Essential hypertension   4. PAC (premature atrial contraction)      PLAN:  In order of problems listed above:  1. Chronic diastolic heart failure: I will hold off on increasing the diuretic, based on the recent lab, her creatinine has been slowly trending up.  She appears to be euvolemic on physical exam. BMET today.  2. Right pleural effusion: Seen on recent abdominal ultrasound, I recommended a repeat chest  x-ray in a week, if right pleural effusion remains very mild, I would recommend continue observation.  3. Hypertension: Blood pressure elevated today, she is only taking hydralazine twice a day, she cannot take the third dose because she keeps forgetting it.  I increased her metoprolol tartrate to 25 mg twice a day from daily dosing.  4. PACs: Denies any palpitation, increase metoprolol to 25 mg twice daily.    Medication Adjustments/Labs and Tests Ordered: Current medicines are reviewed at length with the patient today.  Concerns regarding medicines are outlined above.  Medication changes, Labs and Tests ordered today are listed in the Patient Instructions below. Patient Instructions  Medication Instructions:  DECREASE Hydralazine Take 1 tablet twice a day INCREASE Metoprolol to 25 mg Take 1 tablet twice a day  Labwork: Your physician recommends that you return for lab work in: TODAY-BMET  Testing/Procedures: A chest x-ray takes a picture of the organs and structures inside the chest, including the heart, lungs, and blood vessels. This test can show several things, including, whether the heart is enlarges; whether fluid is building up in the lungs; and whether pacemaker / defibrillator leads are still in place. 1 WEEK COMPLETE CXR AT [place]  Follow-Up: Your physician recommends that you schedule a follow-up appointment in: 4 MONTHS with DR Martinique.  Any Other Special Instructions Will Be Listed Below (If Applicable). If you need a refill on your cardiac medications before your next appointment, please call your pharmacy.     Hilbert Corrigan, [place]  12/19/2017 6:36 AM    [address], [place], [place]  [address] Phone: [phone]; Fax: [phone]

## 2017-12-17 NOTE — Patient Instructions (Addendum)
Medication Instructions:  DECREASE Hydralazine Take 1 tablet twice a day INCREASE Metoprolol to 25 mg Take 1 tablet twice a day  Labwork: Your physician recommends that you return for lab work in: TODAY-BMET  Testing/Procedures: A chest x-ray takes a picture of the organs and structures inside the chest, including the heart, lungs, and blood vessels. This test can show several things, including, whether the heart is enlarges; whether fluid is building up in the lungs; and whether pacemaker / defibrillator leads are still in place. 1 WEEK COMPLETE CXR AT [place]  Follow-Up: Your physician recommends that you schedule a follow-up appointment in: 4 MONTHS with DR Martinique.  Any Other Special Instructions Will Be Listed Below (If Applicable). If you need a refill on your cardiac medications before your next appointment, please call your pharmacy.

## 2017-12-19 ENCOUNTER — Encounter: Payer: Self-pay | Admitting: Physician Assistant

## 2017-12-21 ENCOUNTER — Other Ambulatory Visit: Payer: Self-pay

## 2017-12-21 DIAGNOSIS — I5032 Chronic diastolic (congestive) heart failure: Secondary | ICD-10-CM

## 2017-12-21 NOTE — Progress Notes (Signed)
Patient aware to have labs drawn between 2/18 and 2/22

## 2017-12-24 ENCOUNTER — Ambulatory Visit (HOSPITAL_COMMUNITY)
Admission: RE | Admit: 2017-12-24 | Discharge: 2017-12-24 | Disposition: A | Discharge status: Home / Self Care | Payer: Medicare Other | Source: Ambulatory Visit | Attending: Physician Assistant | Admitting: Physician Assistant

## 2017-12-24 DIAGNOSIS — I110 Hypertensive heart disease with heart failure: Secondary | ICD-10-CM | POA: Diagnosis not present

## 2017-12-24 DIAGNOSIS — R0602 Shortness of breath: Secondary | ICD-10-CM | POA: Diagnosis not present

## 2017-12-24 DIAGNOSIS — I5032 Chronic diastolic (congestive) heart failure: Secondary | ICD-10-CM | POA: Insufficient documentation

## 2017-12-24 DIAGNOSIS — J90 Pleural effusion, not elsewhere classified: Secondary | ICD-10-CM | POA: Diagnosis not present

## 2017-12-24 DIAGNOSIS — I491 Atrial premature depolarization: Secondary | ICD-10-CM | POA: Insufficient documentation

## 2017-12-24 DIAGNOSIS — I700 Atherosclerosis of aorta: Secondary | ICD-10-CM | POA: Diagnosis not present

## 2017-12-26 ENCOUNTER — Other Ambulatory Visit: Payer: Self-pay

## 2017-12-26 DIAGNOSIS — J90 Pleural effusion, not elsewhere classified: Secondary | ICD-10-CM

## 2017-12-27 ENCOUNTER — Ambulatory Visit (INDEPENDENT_AMBULATORY_CARE_PROVIDER_SITE_OTHER): Payer: Medicare Other | Admitting: Otolaryngology

## 2017-12-27 DIAGNOSIS — H6983 Other specified disorders of Eustachian tube, bilateral: Secondary | ICD-10-CM | POA: Diagnosis not present

## 2017-12-27 DIAGNOSIS — H7201 Central perforation of tympanic membrane, right ear: Secondary | ICD-10-CM

## 2017-12-27 DIAGNOSIS — H903 Sensorineural hearing loss, bilateral: Secondary | ICD-10-CM

## 2018-01-04 ENCOUNTER — Other Ambulatory Visit (HOSPITAL_COMMUNITY)
Admission: RE | Admit: 2018-01-04 | Discharge: 2018-01-04 | Disposition: A | Discharge status: Home / Self Care | Payer: Medicare Other | Source: Ambulatory Visit | Attending: Adult Health | Admitting: Adult Health

## 2018-01-04 ENCOUNTER — Other Ambulatory Visit (HOSPITAL_COMMUNITY)
Admission: RE | Admit: 2018-01-04 | Discharge: 2018-01-04 | Disposition: A | Discharge status: Home / Self Care | Payer: Medicare Other | Source: Ambulatory Visit | Attending: Physician Assistant | Admitting: Physician Assistant

## 2018-01-04 DIAGNOSIS — D500 Iron deficiency anemia secondary to blood loss (chronic): Secondary | ICD-10-CM | POA: Insufficient documentation

## 2018-01-04 DIAGNOSIS — K31819 Angiodysplasia of stomach and duodenum without bleeding: Secondary | ICD-10-CM | POA: Insufficient documentation

## 2018-01-04 LAB — IRON AND TIBC
IRON: 47 ug/dL (ref 28–170)
Saturation Ratios: 18 % (ref 10.4–31.8)
TIBC: 265 ug/dL (ref 250–450)
UIBC: 218 ug/dL

## 2018-01-04 LAB — CBC WITH DIFFERENTIAL/PLATELET
BASOS ABS: 0 10*3/uL (ref 0.0–0.1)
Basophils Relative: 0 %
EOS ABS: 0.1 10*3/uL (ref 0.0–0.7)
EOS PCT: 1 %
HCT: 40.4 % (ref 36.0–46.0)
Hemoglobin: 12.9 g/dL (ref 12.0–15.0)
Lymphocytes Relative: 6 %
Lymphs Abs: 0.6 10*3/uL — ABNORMAL LOW (ref 0.7–4.0)
MCH: 31.5 pg (ref 26.0–34.0)
MCHC: 31.9 g/dL (ref 30.0–36.0)
MCV: 98.8 fL (ref 78.0–100.0)
Monocytes Absolute: 1.6 10*3/uL — ABNORMAL HIGH (ref 0.1–1.0)
Monocytes Relative: 14 %
Neutro Abs: 8.7 10*3/uL — ABNORMAL HIGH (ref 1.7–7.7)
Neutrophils Relative %: 79 %
PLATELETS: 319 10*3/uL (ref 150–400)
RBC: 4.09 MIL/uL (ref 3.87–5.11)
RDW: 13.8 % (ref 11.5–15.5)
WBC: 11 10*3/uL — ABNORMAL HIGH (ref 4.0–10.5)

## 2018-01-04 LAB — COMPREHENSIVE METABOLIC PANEL
ALT: 15 U/L (ref 14–54)
AST: 21 U/L (ref 15–41)
Albumin: 2.9 g/dL — ABNORMAL LOW (ref 3.5–5.0)
Alkaline Phosphatase: 92 U/L (ref 38–126)
Anion gap: 9 (ref 5–15)
BUN: 18 mg/dL (ref 6–20)
CHLORIDE: 92 mmol/L — AB (ref 101–111)
CO2: 32 mmol/L (ref 22–32)
CREATININE: 0.87 mg/dL (ref 0.44–1.00)
Calcium: 9.1 mg/dL (ref 8.9–10.3)
GFR calc Af Amer: >60 mL/min (ref >60)
GLUCOSE: 73 mg/dL (ref 65–99)
POTASSIUM: 3.7 mmol/L (ref 3.5–5.1)
Sodium: 133 mmol/L — ABNORMAL LOW (ref 135–145)
Total Bilirubin: 0.6 mg/dL (ref 0.3–1.2)
Total Protein: 6.5 g/dL (ref 6.5–8.1)

## 2018-01-04 LAB — FERRITIN: Ferritin: 226 ng/mL (ref 11–307)

## 2018-01-14 ENCOUNTER — Other Ambulatory Visit (HOSPITAL_COMMUNITY): Payer: PRIVATE HEALTH INSURANCE

## 2018-01-23 ENCOUNTER — Ambulatory Visit (HOSPITAL_COMMUNITY)
Admission: RE | Admit: 2018-01-23 | Discharge: 2018-01-23 | Disposition: A | Discharge status: Home / Self Care | Payer: Medicare Other | Source: Ambulatory Visit | Attending: Physician Assistant | Admitting: Physician Assistant

## 2018-01-23 DIAGNOSIS — J984 Other disorders of lung: Secondary | ICD-10-CM | POA: Insufficient documentation

## 2018-01-23 DIAGNOSIS — J90 Pleural effusion, not elsewhere classified: Secondary | ICD-10-CM | POA: Diagnosis not present

## 2018-01-23 NOTE — Progress Notes (Signed)
Still has small amount of fluid outside the right lung (pleural effusion), however unchanged, would not recommend further workup for this and continue observation for now

## 2018-01-25 ENCOUNTER — Other Ambulatory Visit: Payer: Self-pay

## 2018-01-25 MED ORDER — FUROSEMIDE 20 MG PO TABS: 20.0000 mg | ORAL_TABLET | Freq: Every day | ORAL | 2 refills | Status: DC

## 2018-01-25 NOTE — Telephone Encounter (Signed)
Called patient to give her test results and she requested refill for her Lasix. Informed her that I would send rx to pharmacy. She voiced understanding.

## 2018-01-29 DIAGNOSIS — M4716 Other spondylosis with myelopathy, lumbar region: Secondary | ICD-10-CM | POA: Diagnosis not present

## 2018-02-20 DIAGNOSIS — G64 Other disorders of peripheral nervous system: Secondary | ICD-10-CM | POA: Diagnosis not present

## 2018-02-20 DIAGNOSIS — I499 Cardiac arrhythmia, unspecified: Secondary | ICD-10-CM | POA: Diagnosis not present

## 2018-02-20 DIAGNOSIS — F39 Unspecified mood [affective] disorder: Secondary | ICD-10-CM | POA: Diagnosis not present

## 2018-02-20 DIAGNOSIS — J188 Other pneumonia, unspecified organism: Secondary | ICD-10-CM | POA: Diagnosis not present

## 2018-02-20 DIAGNOSIS — F419 Anxiety disorder, unspecified: Secondary | ICD-10-CM | POA: Diagnosis not present

## 2018-02-20 DIAGNOSIS — D509 Iron deficiency anemia, unspecified: Secondary | ICD-10-CM | POA: Diagnosis not present

## 2018-02-20 DIAGNOSIS — Z8719 Personal history of other diseases of the digestive system: Secondary | ICD-10-CM | POA: Diagnosis not present

## 2018-02-20 DIAGNOSIS — R0781 Pleurodynia: Secondary | ICD-10-CM | POA: Diagnosis not present

## 2018-02-20 DIAGNOSIS — I270 Primary pulmonary hypertension: Secondary | ICD-10-CM | POA: Diagnosis not present

## 2018-02-20 DIAGNOSIS — E039 Hypothyroidism, unspecified: Secondary | ICD-10-CM | POA: Diagnosis not present

## 2018-02-20 DIAGNOSIS — I10 Essential (primary) hypertension: Secondary | ICD-10-CM | POA: Diagnosis not present

## 2018-02-20 DIAGNOSIS — K219 Gastro-esophageal reflux disease without esophagitis: Secondary | ICD-10-CM | POA: Diagnosis not present

## 2018-02-22 DIAGNOSIS — I739 Peripheral vascular disease, unspecified: Secondary | ICD-10-CM | POA: Diagnosis not present

## 2018-02-22 DIAGNOSIS — E039 Hypothyroidism, unspecified: Secondary | ICD-10-CM | POA: Diagnosis not present

## 2018-02-22 DIAGNOSIS — I499 Cardiac arrhythmia, unspecified: Secondary | ICD-10-CM | POA: Diagnosis not present

## 2018-02-22 DIAGNOSIS — I10 Essential (primary) hypertension: Secondary | ICD-10-CM | POA: Diagnosis not present

## 2018-02-22 DIAGNOSIS — K31819 Angiodysplasia of stomach and duodenum without bleeding: Secondary | ICD-10-CM | POA: Diagnosis not present

## 2018-02-22 DIAGNOSIS — Z0000 Encounter for general adult medical examination without abnormal findings: Secondary | ICD-10-CM | POA: Diagnosis not present

## 2018-02-22 DIAGNOSIS — I5022 Chronic systolic (congestive) heart failure: Secondary | ICD-10-CM | POA: Diagnosis not present

## 2018-02-22 DIAGNOSIS — G4700 Insomnia, unspecified: Secondary | ICD-10-CM | POA: Diagnosis not present

## 2018-02-22 DIAGNOSIS — I270 Primary pulmonary hypertension: Secondary | ICD-10-CM | POA: Diagnosis not present

## 2018-02-22 DIAGNOSIS — R002 Palpitations: Secondary | ICD-10-CM | POA: Diagnosis not present

## 2018-02-22 DIAGNOSIS — Z6822 Body mass index (BMI) 22.0-22.9, adult: Secondary | ICD-10-CM | POA: Diagnosis not present

## 2018-03-15 ENCOUNTER — Inpatient Hospital Stay (HOSPITAL_COMMUNITY): Payer: Medicare Other | Attending: Hematology

## 2018-03-15 ENCOUNTER — Inpatient Hospital Stay (HOSPITAL_COMMUNITY): Payer: Medicare Other

## 2018-03-15 DIAGNOSIS — D500 Iron deficiency anemia secondary to blood loss (chronic): Secondary | ICD-10-CM | POA: Insufficient documentation

## 2018-03-15 DIAGNOSIS — D508 Other iron deficiency anemias: Secondary | ICD-10-CM

## 2018-03-15 LAB — IRON AND TIBC
IRON: 62 ug/dL (ref 28–170)
Saturation Ratios: 21 % (ref 10.4–31.8)
TIBC: 301 ug/dL (ref 250–450)
UIBC: 239 ug/dL

## 2018-03-15 LAB — CBC WITH DIFFERENTIAL/PLATELET
Basophils Absolute: 0 10*3/uL (ref 0.0–0.1)
Basophils Relative: 0 %
EOS ABS: 0.1 10*3/uL (ref 0.0–0.7)
EOS PCT: 1 %
HCT: 40.3 % (ref 36.0–46.0)
HEMOGLOBIN: 12.9 g/dL (ref 12.0–15.0)
LYMPHS ABS: 0.5 10*3/uL — AB (ref 0.7–4.0)
LYMPHS PCT: 6 %
MCH: 31.5 pg (ref 26.0–34.0)
MCHC: 32 g/dL (ref 30.0–36.0)
MCV: 98.5 fL (ref 78.0–100.0)
MONOS PCT: 13 %
Monocytes Absolute: 1.3 10*3/uL — ABNORMAL HIGH (ref 0.1–1.0)
Neutro Abs: 7.9 10*3/uL — ABNORMAL HIGH (ref 1.7–7.7)
Neutrophils Relative %: 80 %
PLATELETS: 301 10*3/uL (ref 150–400)
RBC: 4.09 MIL/uL (ref 3.87–5.11)
RDW: 13.8 % (ref 11.5–15.5)
WBC: 9.8 10*3/uL (ref 4.0–10.5)

## 2018-03-15 LAB — COMPREHENSIVE METABOLIC PANEL
ALK PHOS: 86 U/L (ref 38–126)
ALT: 19 U/L (ref 14–54)
ANION GAP: 10 (ref 5–15)
AST: 24 U/L (ref 15–41)
Albumin: 3.2 g/dL — ABNORMAL LOW (ref 3.5–5.0)
BUN: 31 mg/dL — ABNORMAL HIGH (ref 6–20)
CALCIUM: 9.3 mg/dL (ref 8.9–10.3)
CO2: 31 mmol/L (ref 22–32)
CREATININE: 0.94 mg/dL (ref 0.44–1.00)
Chloride: 94 mmol/L — ABNORMAL LOW (ref 101–111)
GFR, EST NON AFRICAN AMERICAN: 55 mL/min — AB (ref >60)
Glucose, Bld: 104 mg/dL — ABNORMAL HIGH (ref 65–99)
Potassium: 4.2 mmol/L (ref 3.5–5.1)
Sodium: 135 mmol/L (ref 135–145)
Total Bilirubin: 0.6 mg/dL (ref 0.3–1.2)
Total Protein: 6.8 g/dL (ref 6.5–8.1)

## 2018-03-15 LAB — FERRITIN: FERRITIN: 54 ng/mL (ref 11–307)

## 2018-03-15 NOTE — Progress Notes (Signed)
Nutrition Follow-up:  Patient with anemia from gastric antral vascular ectasia syndrome.    Met with patient following lab work today.  Patient's friend from church comes with her today.  Patient reports that she gives out easily.  "I think I need some iron."  Patient able to walk from waiting room to office but holds on to friend.  Request wheelchair out of the office today.  Reports that she has been trying to eat and drinks about 3 ensure plus per day.  Reports she tries to make milkshake with cream 1 time per day.  Eats a egg every morning.  Lunch is usually soup and supper is beef pot pie or leftovers that daughter prepares.  "I don't have much energy."     Medications: reviewed  Labs: reviewed  Anthropometrics:   Weight measured today in clinic and 111 lb decreased from 117 lb on 1/4.   5% weight loss in the last 4 months.  Re-Estimated Energy Needs  Kcals: 1500 -1750calories  Protein: 75-100 g/d Fluid: >1.5 L/d  NUTRITION DIAGNOSIS: Inadequate oral intake continues   MALNUTRITION DIAGNOSIS: continues to monitor   INTERVENTION:   Reviewed strategies to increase calories and protein. Handout provided We discussed quick,easy meals to prepare due to fatigue  Encouraged ensure plus at least 3 per day (additional 1050 calories). Provided patient with 3 rd case of complimentary ensure plus provided today Patient may benefit from adding appetite stimulant Discussed with RN, Horris Latino about patient fatigue and question if needed iron.  Iron labs not back yet and nurse will call with results.  Not able to accommodate patient today in infusion even if labs indicated needing treatment.  If symptoms get worse patient was instructed to go to ED over the weekend.    MONITORING, EVALUATION, GOAL: weight trends, intake   NEXT VISIT: as needed  Hollye Pritt B. Zenia Resides, [place]aria Arenas, [place] Registered Dietitian [phone] (pager)

## 2018-03-20 ENCOUNTER — Encounter (HOSPITAL_COMMUNITY): Payer: Self-pay | Admitting: Adult Health

## 2018-03-20 ENCOUNTER — Other Ambulatory Visit (HOSPITAL_COMMUNITY): Payer: Self-pay | Admitting: Adult Health

## 2018-03-20 ENCOUNTER — Telehealth (HOSPITAL_COMMUNITY): Payer: Self-pay | Admitting: *Deleted

## 2018-03-20 NOTE — Telephone Encounter (Signed)
Reviewed labs with Mike Craze, NP, and the patient will receive one dose of iron next week.  Scheduling to call to the patient with date and time.  See note in chart.

## 2018-03-20 NOTE — Progress Notes (Signed)
Patient's recent blood work discussed with Horris Latino, Therapist, sports.  Patient reportedly with shortness of breath.  Her ferritin is 54 on recent labs dated 03/15/2018; Hgb normal at 12.9 g/dL.  Remainder of CBC largely within normal limits and stable.  Patient is requesting a dose of IV iron, "because ferritin is less than 100 and I was told and always needed a dose if my ferritin is < 100."  Given her shortness of breath, we will make arrangements for 1 dose of IV Feraheme when she is able.  Supportive therapy treatment plan felt.  Nursing to help make arrangements for patient to come in when she is able.    Mike Craze, NP [place] [phone]

## 2018-03-25 ENCOUNTER — Encounter (HOSPITAL_COMMUNITY): Payer: Self-pay

## 2018-03-25 ENCOUNTER — Inpatient Hospital Stay (HOSPITAL_COMMUNITY): Payer: Medicare Other

## 2018-03-25 VITALS — BP 124/56 | HR 73 | Temp 97.7°F | Resp 18

## 2018-03-25 DIAGNOSIS — D500 Iron deficiency anemia secondary to blood loss (chronic): Secondary | ICD-10-CM

## 2018-03-25 MED ORDER — SODIUM CHLORIDE 0.9 % IV SOLN
510.0000 mg | Freq: Once | INTRAVENOUS | Status: AC
  Administered 2018-03-25: 510 mg via INTRAVENOUS
  Filled 2018-03-25: qty 17

## 2018-03-25 MED ORDER — SODIUM CHLORIDE 0.9 % IV SOLN
Freq: Once | INTRAVENOUS | Status: AC
  Administered 2018-03-25: 14:00:00 via INTRAVENOUS

## 2018-03-25 NOTE — Patient Instructions (Signed)
[facility] Discharge Instructions  Received Feraheme infusion today. Follow-up as scheduled. Call clinic for any questions or concerns   Thank you for choosing [facility] at [facility] to provide your oncology and hematology care.  To afford each patient quality time with our provider, please arrive at least 15 minutes before your scheduled appointment time.   If you have a lab appointment with the Cancer Center please come in thru the  Main Entrance and check in at the main information desk  You need to re-schedule your appointment should you arrive 10 or more minutes late.  We strive to give you quality time with our providers, and arriving late affects you and other patients whose appointments are after yours.  Also, if you no show three or more times for appointments you may be dismissed from the clinic at the providers discretion.     Again, thank you for choosing [facility].  Our hope is that these requests will decrease the amount of time that you wait before being seen by our physicians.       _____________________________________________________________  Should you have questions after your visit to [facility], please contact our office at [phone] between the hours of 8:30 a.m. and 4:30 p.m.  Voicemails left after 4:30 p.m. will not be returned until the following business day.  For prescription refill requests, have your pharmacy contact our office.       Resources For Cancer Patients and their Caregivers ? American Cancer Society: Can assist with transportation, wigs, general needs, runs Look Good Feel Better.        1-888-227-6333 ? Cancer Care: Provides financial assistance, online support groups, medication/co-pay assistance.  1-800-813-HOPE (4673) ? [facility] Assists [place] cancer patients and their families through emotional , educational and  financial support.  336-427-4357 ? [place] DSS Where to apply for food stamps, Medicaid and utility assistance. 336-342-1394 ? [facility]: Transportation to medical appointments. 336-347-2287 ? Social Security Administration: May apply for disability if have a Stage IV cancer. 336-342-7796 1-800-772-1213 ? [place] Aging, Disability and Transit Services: Assists with nutrition, care and transit needs. 336-349-2343  Cancer Center Support Programs:   > Cancer Support Group  2nd Tuesday of the month 1pm-2pm, Journey Room   > Creative Journey  3rd Tuesday of the month 1130am-1pm, Journey Room    

## 2018-03-25 NOTE — Progress Notes (Signed)
[name] tolerated Feraheme infusion well without complaints or incident. VSS upon discharge. Pt discharged self ambulatory in satisfactory condition accompanied by a friend

## 2018-04-01 DIAGNOSIS — H5203 Hypermetropia, bilateral: Secondary | ICD-10-CM | POA: Diagnosis not present

## 2018-04-01 DIAGNOSIS — H26493 Other secondary cataract, bilateral: Secondary | ICD-10-CM | POA: Diagnosis not present

## 2018-04-01 DIAGNOSIS — Z961 Presence of intraocular lens: Secondary | ICD-10-CM | POA: Diagnosis not present

## 2018-04-01 DIAGNOSIS — H43813 Vitreous degeneration, bilateral: Secondary | ICD-10-CM | POA: Diagnosis not present

## 2018-04-10 IMAGING — DX DG CHEST 2V
2 series · 2 of 2 positions shown · non-contrast
Comparison: 07/12/2016

CLINICAL DATA: Pneumonia in the beginning [DATE]

EXAM:
CHEST  2 VIEW

[chest pa]
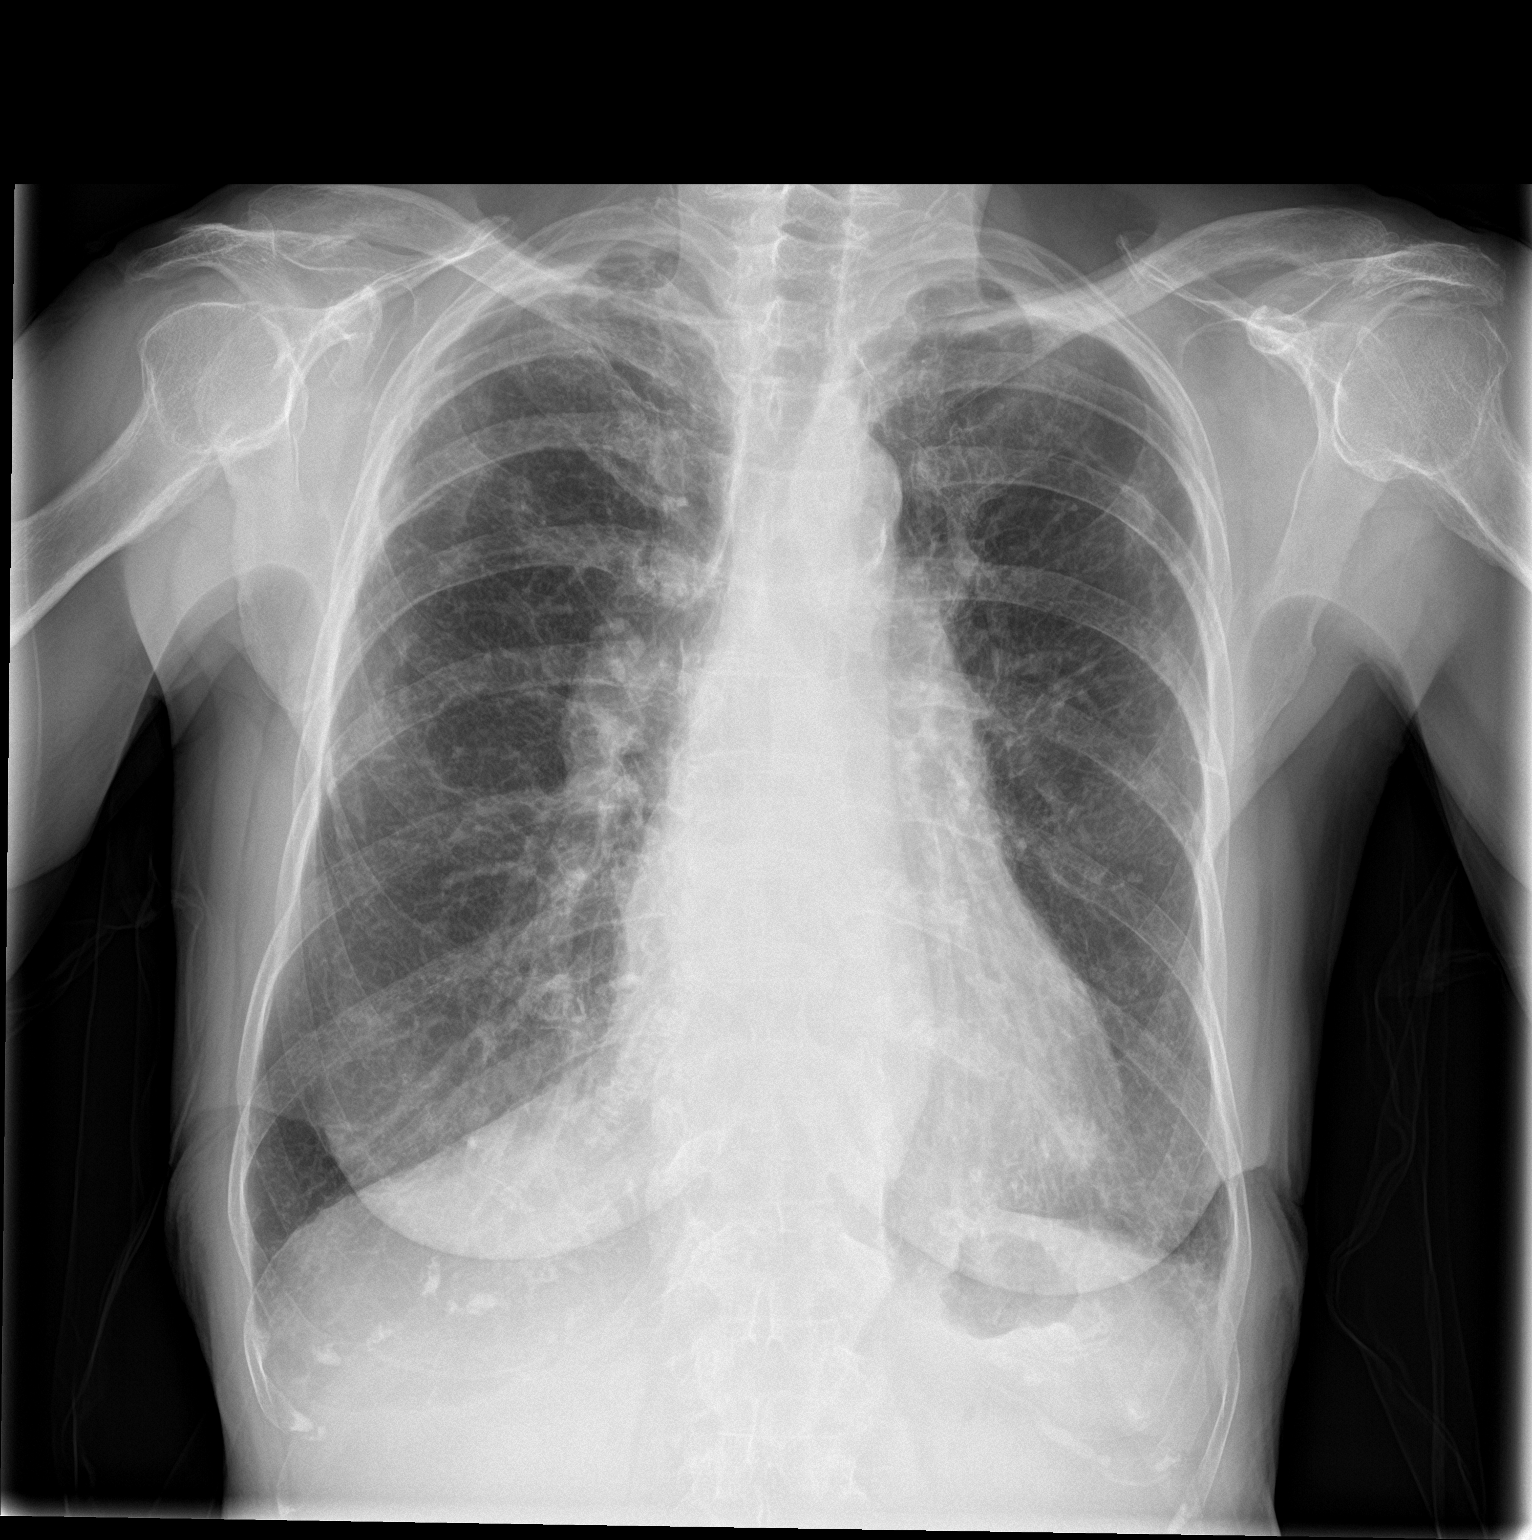

[chest lat]
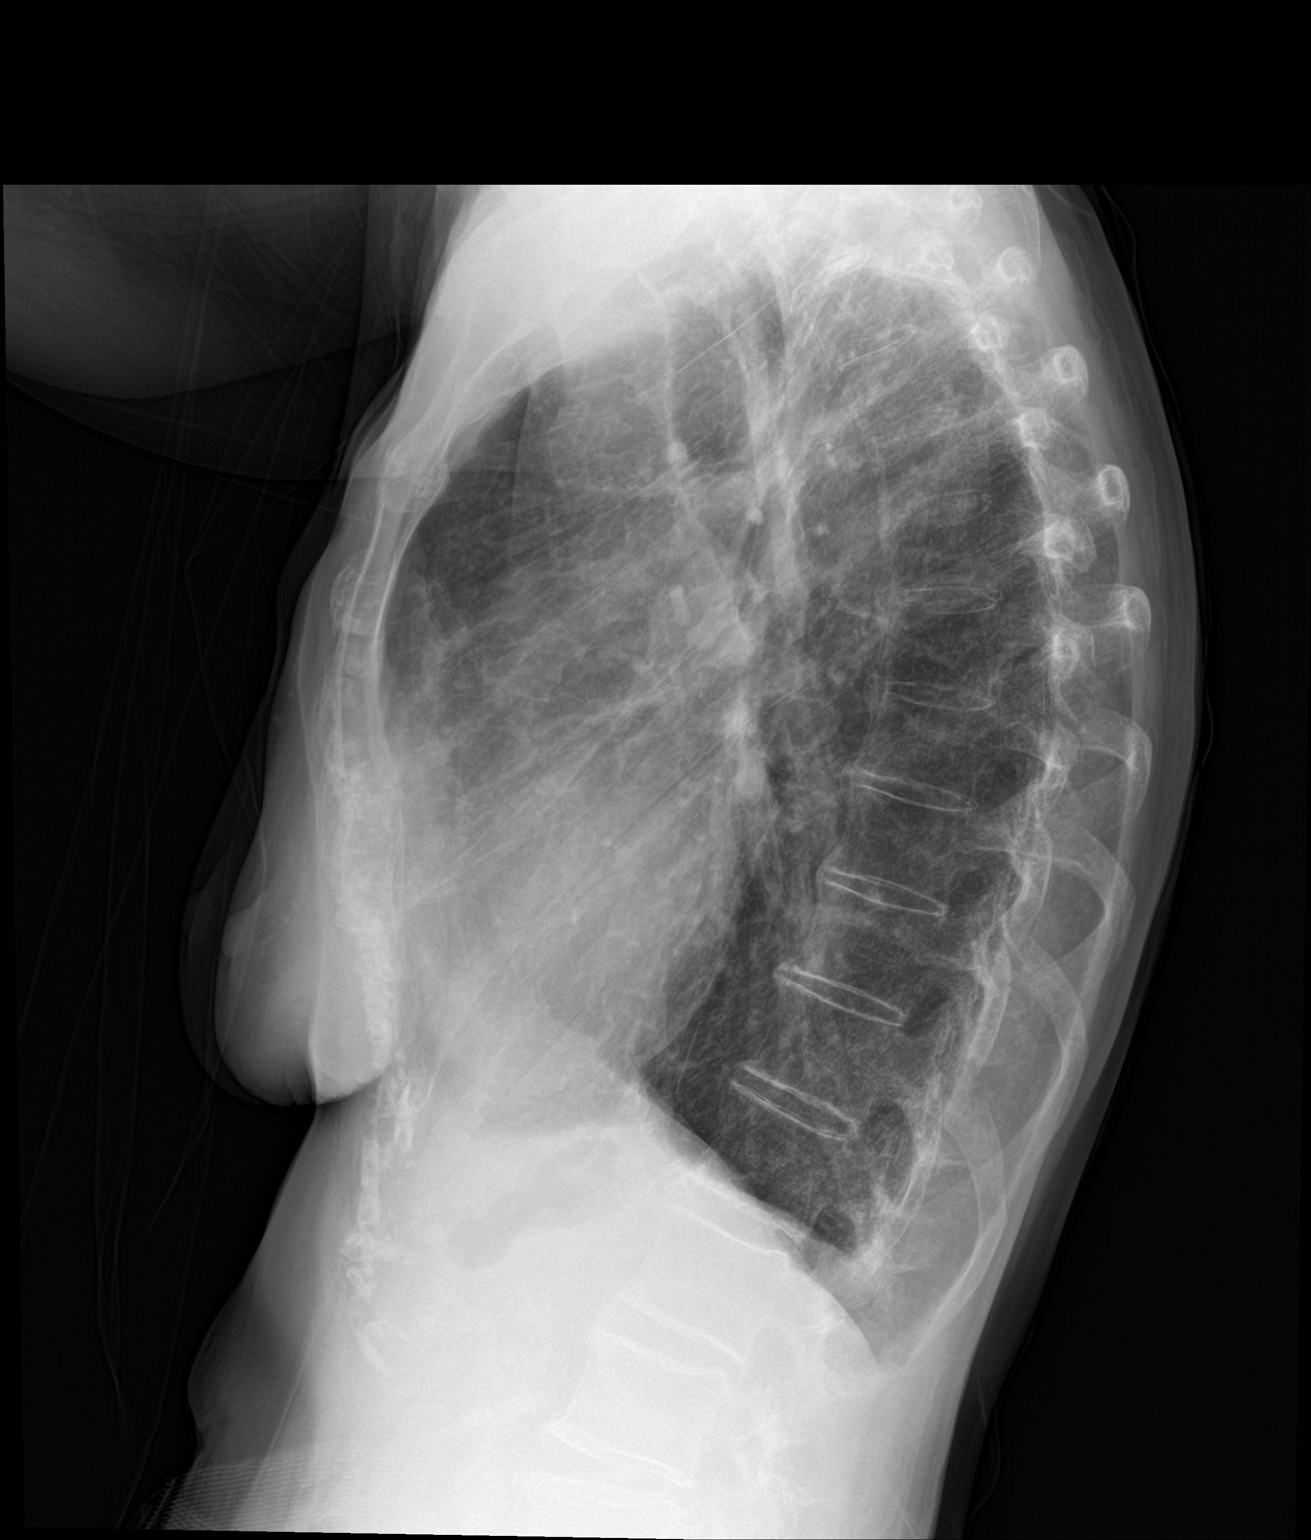

[2 of 2 positions shown; findings below may reference images not displayed]

FINDINGS: Emphysematous hyperinflation of the lungs. The opacity localizing to
the lingula or right upper lobe on the lateral view has cleared.
Chronic stable pleural parenchymal thickening is seen at the disease
with left upper lobe subpleural scarring projecting over the left
fifth rib. The cardiac and mediastinal contours are stable with
again atherosclerosis in the aortic arch. No aneurysm. No pleural
effusion or pneumothorax. No suspicious osseous abnormalities.
IMPRESSION: Clearing of previously noted consolidation best seen on the lateral
view localizing to the right upper lobe anteriorly or lingula.
Chronic apical pleural parenchymal thickening and scarring. COPD.

## 2018-04-20 NOTE — Progress Notes (Signed)
Cardiology Office Note    Date:  04/24/2018   ID:  KJERSTIN [name], DOB [birth year], MRN [id number]  PCP:  Celene Squibb, MD  Cardiologist:  Dr. Martinique   Chief Complaint  Patient presents with  . Hypertension    History of Present Illness:  [name] is a 84 y.o. female with PMH of HTN, PACs, and syncope.  Myoview performed in February 2013 was negative.  EF 83%.  Echocardiogram showed normal LV function with moderate pulmonary hypertension.  She uses 2 L nasal cannula at night however does not need to use it during the day.  She has a history of gastric antral vascular ectasia.  She underwent upper EGD in November 2017 and had coagulation with argon plasma for bleeding prophylaxis.  Hemoglobin has been maintained with periodic iron infusion.  She had 2 witnessed syncopal episodes in December 2017.  Carotid Doppler obtained on 11/09/2016 showed 1-39% disease in bilateral ICA.  30 day Heart monitor at the time showed PACs however no significant ventricular ectopy.  She apparently was sick and was on antibiotic for pneumonia at the time.  Last echocardiogram obtained on 11/27/2017 showed EF 60-65%, grade 2 DD, mild LVH, moderate AI, mildly dilated left atrium.  She was last seen by Almyra Deforest PA-C in February. BP was elevated and lopressor was increased. CXR showed a small right pleural effusion that was stable.   On follow up today she is doing well. Denies any increase dyspnea or edema. She has a tube in her ear and notes she can hear her heartbeat in her ears. She was losing weight but has gained some back. She is fairly inactive. No chest pain.   Past Medical History:  Diagnosis Date  . Anemia   . Chronic bronchitis ([place])   . GAVE (gastric antral vascular ectasia) 09/12/12  . Hypertension   . Hypothyroidism   . Iron deficiency anemia 10/15/2012  . Iron deficiency anemia due to chronic blood loss 04/20/2014  . On home oxygen therapy    "2L/[place] at night" (11/07/2016)  . Palpitations   .  Pneumonia 06/2016; 10/2016  . Syncope and collapse 11/06/2016    Past Surgical History:  Procedure Laterality Date  . BACK SURGERY    . BIOPSY  12/27/2016   Procedure: BIOPSY;  Surgeon: Rogene Houston, MD;  [facility];  Service: Endoscopy;;  gastric  . COLONOSCOPY  07/24/2012   Procedure: COLONOSCOPY;  Surgeon: Rogene Houston, MD;  [facility];  Service: Endoscopy;  Laterality: N/A;  200  . DILATION AND CURETTAGE OF UTERUS    . ESOPHAGOGASTRODUODENOSCOPY  09/12/2012   Procedure: ESOPHAGOGASTRODUODENOSCOPY (EGD);  Surgeon: Rogene Houston, MD;  [facility];  Service: Endoscopy;  Laterality: N/A;  325-changed to 200 per Ann-pt moved up to [place] to notify pt  . ESOPHAGOGASTRODUODENOSCOPY  11/01/2012   Procedure: ESOPHAGOGASTRODUODENOSCOPY (EGD);  Surgeon: Rogene Houston, MD;  [facility];  Service: Endoscopy;  Laterality: N/A;  1:20  . ESOPHAGOGASTRODUODENOSCOPY N/A 07/04/2013   Procedure: ESOPHAGOGASTRODUODENOSCOPY (EGD);  Surgeon: Rogene Houston, MD;  [facility];  Service: Endoscopy;  Laterality: N/A;  850  . ESOPHAGOGASTRODUODENOSCOPY N/A 08/06/2013   Procedure: ESOPHAGOGASTRODUODENOSCOPY (EGD);  Surgeon: Rogene Houston, MD;  [facility];  Service: Endoscopy;  Laterality: N/A;  1200  . ESOPHAGOGASTRODUODENOSCOPY N/A 10/31/2013   Procedure: ESOPHAGOGASTRODUODENOSCOPY (EGD);  Surgeon: Rogene Houston, MD;  [facility];  Service:  Endoscopy;  Laterality: N/A;  125-moved to [place] notified pt  . ESOPHAGOGASTRODUODENOSCOPY N/A 10/23/2014   Procedure: ESOPHAGOGASTRODUODENOSCOPY (EGD);  Surgeon: Rogene Houston, MD;  [facility];  Service: Endoscopy;  Laterality: N/A;  1020  . ESOPHAGOGASTRODUODENOSCOPY N/A 10/11/2016   Procedure: ESOPHAGOGASTRODUODENOSCOPY (EGD);  Surgeon: Rogene Houston, MD;  [facility];  Service: Endoscopy;  Laterality: N/A;  245  . ESOPHAGOGASTRODUODENOSCOPY N/A  12/27/2016   Procedure: ESOPHAGOGASTRODUODENOSCOPY (EGD);  Surgeon: Rogene Houston, MD;  [facility];  Service: Endoscopy;  Laterality: N/A;  1225  . HEEL SPUR SURGERY Bilateral   . HOT HEMOSTASIS  11/01/2012   Procedure: HOT HEMOSTASIS (ARGON PLASMA COAGULATION/BICAP);  Surgeon: Rogene Houston, MD;  [facility];  Service: Endoscopy;  Laterality: N/A;  . HOT HEMOSTASIS N/A 07/04/2013   Procedure: HOT HEMOSTASIS (ARGON PLASMA COAGULATION/BICAP);  Surgeon: Rogene Houston, MD;  [facility];  Service: Endoscopy;  Laterality: N/A;  . HOT HEMOSTASIS N/A 08/06/2013   Procedure: HOT HEMOSTASIS (ARGON PLASMA COAGULATION/BICAP);  Surgeon: Rogene Houston, MD;  [facility];  Service: Endoscopy;  Laterality: N/A;  . HOT HEMOSTASIS N/A 10/31/2013   Procedure: HOT HEMOSTASIS (ARGON PLASMA COAGULATION/BICAP);  Surgeon: Rogene Houston, MD;  [facility];  Service: Endoscopy;  Laterality: N/A;  . HOT HEMOSTASIS N/A 10/23/2014   Procedure: HOT HEMOSTASIS (ARGON PLASMA COAGULATION/BICAP);  Surgeon: Rogene Houston, MD;  [facility];  Service: Endoscopy;  Laterality: N/A;  . HOT HEMOSTASIS N/A 10/11/2016   Procedure: HOT HEMOSTASIS (ARGON PLASMA COAGULATION/BICAP);  Surgeon: Rogene Houston, MD;  [facility];  Service: Endoscopy;  Laterality: N/A;  . JOINT REPLACEMENT    . KNEE ARTHROSCOPY Right    "before replacement"  . LUMBAR DISC SURGERY     "bone spur  . LUMBAR FUSION  06/25/2014   L4 & L5  . SHOULDER SURGERY Right    "spur"  . TONSILLECTOMY AND ADENOIDECTOMY    . TOTAL ABDOMINAL HYSTERECTOMY    . TOTAL KNEE ARTHROPLASTY Right   . US ECHOCARDIOGRAPHY  03/12/2006   EF 55-60%    Current Medications: Outpatient Medications Prior to Visit  Medication Sig Dispense Refill  . albuterol (PROVENTIL) (2.5 MG/3ML) 0.083% nebulizer solution Take 2.5 mg by nebulization every 6 (six) hours as needed for wheezing or shortness of  breath.    . cyanocobalamin 1000 MCG tablet Take 1,000 mcg by mouth daily.    . digoxin (DIGOX) 0.125 MG tablet Take 1 tablet mouth daily except none on Sundays or as directed 30 tablet 5  . feeding supplement, ENSURE ENLIVE, (ENSURE ENLIVE) LIQD Take 237 mLs by mouth 2 (two) times daily between meals.    . furosemide (LASIX) 20 MG tablet Take 1 tablet (20 mg total) by mouth daily. 90 tablet 2  . hydrALAZINE (APRESOLINE) 10 MG tablet Take 1 tablet (10 mg total) by mouth 2 (two) times daily. 60 tablet 3  . levothyroxine (SYNTHROID, LEVOTHROID) 112 MCG tablet Take 112 mcg by mouth daily before breakfast.    . metoprolol tartrate (LOPRESSOR) 25 MG tablet Take 1 tablet (25 mg total) by mouth 2 (two) times daily. 60 tablet 5  . pantoprazole (PROTONIX) 40 MG tablet TK 1 T PO D  5  . senna (SENOKOT) 8.6 MG tablet Take 1 tablet by mouth as needed for constipation.    . lidocaine (XYLOCAINE) 5 % ointment Apply 1 application topically as needed. (Patient  not taking: Reported on 04/24/2018) 35.44 g 0  . lidocaine (XYLOCAINE) 5 % ointment Apply 1 application topically as directed.    . potassium chloride (K-DUR) 10 MEQ tablet Take 10 mEq by mouth daily.      Facility-Administered Medications Prior to Visit  Medication Dose Route Frequency Provider Last Rate Last Dose  . 0.9 %  sodium chloride infusion   Intravenous Continuous B[facility], PA-C 20 mL/hr at 11/16/17 1025       Allergies:   Flagyl [metronidazole]; Avapro [irbesartan]; Clarithromycin; Dexamethasone; Losartan potassium-hctz; Maxzide [hydrochlorothiazide w-triamterene]; Ziac [bisoprolol-hydrochlorothiazide]; Penicillins; and Sulfa drugs cross reactors   Social History   Socioeconomic History  . Marital status: Widowed    Spouse name: Not on file  . Number of children: Not on file  . Years of education: Not on file  . Highest education level: Not on file  Occupational History  . Not on file  Social Needs  . Financial resource  strain: Not on file  . Food insecurity:    Worry: Not on file    Inability: Not on file  . Transportation needs:    Medical: Not on file    Non-medical: Not on file  Tobacco Use  . Smoking status: Never Smoker  . Smokeless tobacco: Never Used  Substance and Sexual Activity  . Alcohol use: No  . Drug use: No  . Sexual activity: Never  Lifestyle  . Physical activity:    Days per week: Not on file    Minutes per session: Not on file  . Stress: Not on file  Relationships  . Social connections:    Talks on phone: Not on file    Gets together: Not on file    Attends religious service: Not on file    Active member of club or organization: Not on file    Attends meetings of clubs or organizations: Not on file    Relationship status: Not on file  Other Topics Concern  . Not on file  Social History Narrative  . Not on file     Family History:  The patient's family history includes Heart attack in her father; Hypertension in her father and mother; Other in her brother and sister.   ROS:   Please see the history of present illness.    ROS All other systems reviewed and are negative.   PHYSICAL EXAM:   VS:  BP (!) 142/82   Pulse 66   Ht 5\' 1"  (1.549 m)   Wt 113 lb (51.3 kg)   SpO2 93%   BMI 21.35 kg/m    GENERAL:  Well appearing, elderly WF in NAD HEENT:  PERRL, EOMI, sclera are clear. Oropharynx is clear. NECK:  No jugular venous distention, carotid upstroke brisk and symmetric, no bruits, no thyromegaly or adenopathy LUNGS:  Clear to auscultation bilaterally CHEST:  Unremarkable HEART:  RRR,  PMI not displaced or sustained,S1 and S2 within normal limits, no S3, no S4: no clicks, no rubs, no murmurs ABD:  Soft, nontender. BS +, no masses or bruits. No hepatomegaly, no splenomegaly EXT:  2 + pulses throughout, no edema, no cyanosis no clubbing SKIN:  Warm and dry.  No rashes NEURO:  Alert and oriented x 3. Cranial nerves II through XII intact. PSYCH:  Cognitively  intact    Wt Readings from Last 3 Encounters:  04/24/18 113 lb (51.3 kg)  03/15/18 111 lb (50.3 kg)  12/17/17 114 lb (51.7 kg)      Studies/Labs  Reviewed:   EKG:  EKG is not ordered today.    Recent Labs: 03/15/2018: ALT 19; BUN 31; Creatinine, Ser 0.94; Hemoglobin 12.9; Platelets 301; Potassium 4.2; Sodium 135   Lipid Panel    Component Value Date/Time   CHOL 146 09/16/2015 0855   TRIG 106 09/16/2015 0855   HDL 48 09/16/2015 0855   CHOLHDL 3.0 09/16/2015 0855   VLDL 21 09/16/2015 0855   LDLCALC 77 09/16/2015 0855   Dated 02/20/18: cholesterol 165, triglycerides 77, HDL 60, LDL 77.   Additional studies/ records that were reviewed today include:   Carotid US 11/09/2016 Summary:  - The vertebral arteries appear patent with antegrade flow. - Findings consistent with 1-39 percent stenosis involving the right internal carotid artery and the left internal carotid artery.   Echo 11/27/2017 LV EF: 60% -   65%  Study Conclusions  - Left ventricle: Wall thickness was increased in a pattern of mild   LVH. Systolic function was normal. The estimated ejection   fraction was in the range of 60% to 65%. Wall motion was normal;   there were no regional wall motion abnormalities. Features are   consistent with a pseudonormal left ventricular filling pattern,   with concomitant abnormal relaxation and increased filling   pressure (grade 2 diastolic dysfunction). Doppler parameters are   consistent with high ventricular filling pressure. - Aortic valve: Moderately calcified annulus. Trileaflet. Sclerosis   without stenosis. There was moderate regurgitation. - Mitral valve: Calcified annulus. Mildly thickened leaflets . - Left atrium: The atrium was mildly dilated. - Right atrium: The atrium was mildly dilated. - Tricuspid valve: There was mild regurgitation.    ASSESSMENT:    1. Chronic diastolic heart failure ([place])   2. Essential hypertension   3. PAC (premature  atrial contraction)      PLAN:  In order of problems listed above:  1. Chronic diastolic heart failure: She appears to be euvolemic on physical exam. Will continue lasix 20 mg daily. Encourage use of support hose.   2. Right pleural effusion: Small by CXR and stable. No therapy needed.   3. Hypertension: Blood pressure is improved since increase in metoprolol.   4. PACs: Denies any palpitation  I will follow up in 6 months.    Medication Adjustments/Labs and Tests Ordered: Current medicines are reviewed at length with the patient today.  Concerns regarding medicines are outlined above.  Medication changes, Labs and Tests ordered today are listed in the Patient Instructions below. Patient Instructions  Continue your current therapy  I will see you in 6 months    Signed, Damita Eppard Martinique, MD  04/24/2018 10:29 AM    [facility] [address], [place], [place]  [address] Phone: [phone]; Fax: [phone]

## 2018-04-22 ENCOUNTER — Ambulatory Visit (INDEPENDENT_AMBULATORY_CARE_PROVIDER_SITE_OTHER): Payer: Medicare Other | Admitting: Otolaryngology

## 2018-04-22 DIAGNOSIS — H903 Sensorineural hearing loss, bilateral: Secondary | ICD-10-CM | POA: Diagnosis not present

## 2018-04-22 DIAGNOSIS — H9311 Tinnitus, right ear: Secondary | ICD-10-CM

## 2018-04-22 DIAGNOSIS — H7201 Central perforation of tympanic membrane, right ear: Secondary | ICD-10-CM | POA: Diagnosis not present

## 2018-04-24 ENCOUNTER — Encounter: Payer: Self-pay | Admitting: Cardiology

## 2018-04-24 ENCOUNTER — Ambulatory Visit (INDEPENDENT_AMBULATORY_CARE_PROVIDER_SITE_OTHER): Payer: Medicare Other | Admitting: Cardiology

## 2018-04-24 VITALS — BP 142/82 | HR 66 | Ht 61.0 in | Wt 113.0 lb

## 2018-04-24 DIAGNOSIS — I10 Essential (primary) hypertension: Secondary | ICD-10-CM | POA: Diagnosis not present

## 2018-04-24 DIAGNOSIS — I5032 Chronic diastolic (congestive) heart failure: Secondary | ICD-10-CM

## 2018-04-24 DIAGNOSIS — I491 Atrial premature depolarization: Secondary | ICD-10-CM | POA: Diagnosis not present

## 2018-04-24 NOTE — Patient Instructions (Signed)
Continue your current therapy  I will see you in 6 months.   

## 2018-05-03 DIAGNOSIS — M4716 Other spondylosis with myelopathy, lumbar region: Secondary | ICD-10-CM | POA: Diagnosis not present

## 2018-05-13 ENCOUNTER — Other Ambulatory Visit (HOSPITAL_COMMUNITY): Payer: Self-pay | Admitting: *Deleted

## 2018-05-13 DIAGNOSIS — D508 Other iron deficiency anemias: Secondary | ICD-10-CM

## 2018-05-14 ENCOUNTER — Other Ambulatory Visit: Payer: Self-pay | Admitting: Internal Medicine

## 2018-05-14 DIAGNOSIS — Z1231 Encounter for screening mammogram for malignant neoplasm of breast: Secondary | ICD-10-CM

## 2018-05-15 ENCOUNTER — Inpatient Hospital Stay (HOSPITAL_COMMUNITY): Payer: Medicare Other | Attending: Hematology

## 2018-05-15 DIAGNOSIS — R0609 Other forms of dyspnea: Secondary | ICD-10-CM | POA: Insufficient documentation

## 2018-05-15 DIAGNOSIS — R200 Anesthesia of skin: Secondary | ICD-10-CM | POA: Diagnosis not present

## 2018-05-15 DIAGNOSIS — R5383 Other fatigue: Secondary | ICD-10-CM | POA: Insufficient documentation

## 2018-05-15 DIAGNOSIS — D500 Iron deficiency anemia secondary to blood loss (chronic): Secondary | ICD-10-CM | POA: Diagnosis not present

## 2018-05-15 DIAGNOSIS — D508 Other iron deficiency anemias: Secondary | ICD-10-CM

## 2018-05-15 LAB — CBC WITH DIFFERENTIAL/PLATELET
BASOS ABS: 0.1 10*3/uL (ref 0.0–0.1)
Basophils Relative: 1 %
EOS ABS: 0.2 10*3/uL (ref 0.0–0.7)
Eosinophils Relative: 2 %
HCT: 42.1 % (ref 36.0–46.0)
Hemoglobin: 13.5 g/dL (ref 12.0–15.0)
LYMPHS ABS: 1.1 10*3/uL (ref 0.7–4.0)
Lymphocytes Relative: 11 %
MCH: 31.7 pg (ref 26.0–34.0)
MCHC: 32.1 g/dL (ref 30.0–36.0)
MCV: 98.8 fL (ref 78.0–100.0)
Monocytes Absolute: 1 10*3/uL (ref 0.1–1.0)
Monocytes Relative: 10 %
NEUTROS PCT: 76 %
Neutro Abs: 7.8 10*3/uL (ref 1.7–7.7)
PLATELETS: 319 10*3/uL (ref 150–400)
RBC: 4.26 MIL/uL (ref 3.87–5.11)
RDW: 14.4 % (ref 11.5–15.5)
WBC MORPHOLOGY: INCREASED
WBC: 10.2 10*3/uL (ref 4.0–10.5)

## 2018-05-15 LAB — COMPREHENSIVE METABOLIC PANEL
ALBUMIN: 3 g/dL — AB (ref 3.5–5.0)
ALT: 21 U/L (ref 0–44)
AST: 26 U/L (ref 15–41)
Alkaline Phosphatase: 87 U/L (ref 38–126)
Anion gap: 7 (ref 5–15)
BUN: 27 mg/dL — ABNORMAL HIGH (ref 8–23)
CHLORIDE: 92 mmol/L — AB (ref 98–111)
CO2: 35 mmol/L — ABNORMAL HIGH (ref 22–32)
Calcium: 9 mg/dL (ref 8.9–10.3)
Creatinine, Ser: 0.88 mg/dL (ref 0.44–1.00)
GFR, EST NON AFRICAN AMERICAN: 59 mL/min — AB (ref >60)
Glucose, Bld: 82 mg/dL (ref 70–99)
POTASSIUM: 4.1 mmol/L (ref 3.5–5.1)
SODIUM: 134 mmol/L — AB (ref 135–145)
Total Bilirubin: 0.5 mg/dL (ref 0.3–1.2)
Total Protein: 6.7 g/dL (ref 6.5–8.1)

## 2018-05-15 LAB — IRON AND TIBC
IRON: 56 ug/dL (ref 28–170)
Saturation Ratios: 18 % (ref 10.4–31.8)
TIBC: 312 ug/dL (ref 250–450)
UIBC: 256 ug/dL

## 2018-05-15 LAB — FERRITIN: FERRITIN: 50 ng/mL (ref 11–307)

## 2018-05-17 ENCOUNTER — Encounter (HOSPITAL_COMMUNITY): Payer: Self-pay | Admitting: Hematology

## 2018-05-17 ENCOUNTER — Inpatient Hospital Stay (HOSPITAL_BASED_OUTPATIENT_CLINIC_OR_DEPARTMENT_OTHER): Payer: Medicare Other | Admitting: Hematology

## 2018-05-17 VITALS — BP 148/57 | HR 68 | Temp 97.7°F | Resp 16 | Wt 114.8 lb

## 2018-05-17 DIAGNOSIS — R5383 Other fatigue: Secondary | ICD-10-CM | POA: Diagnosis not present

## 2018-05-17 DIAGNOSIS — R200 Anesthesia of skin: Secondary | ICD-10-CM | POA: Diagnosis not present

## 2018-05-17 DIAGNOSIS — D500 Iron deficiency anemia secondary to blood loss (chronic): Secondary | ICD-10-CM

## 2018-05-17 DIAGNOSIS — D509 Iron deficiency anemia, unspecified: Secondary | ICD-10-CM

## 2018-05-17 DIAGNOSIS — R0609 Other forms of dyspnea: Secondary | ICD-10-CM

## 2018-05-17 NOTE — Assessment & Plan Note (Signed)
1.  Iron deficiency state: - History of gastric antral vascular ectasia, last EGD on 12/27/2016 showing normal esophagus, moderate GAVE with bleeding in the antrum and prepyloric region, status post APC -Denies any bleeding per rectum or melena.  Complains of feeling tired.  Ferritin level today was 50.  Last Feraheme infusion was on 03/25/2018.  She did not get much improvement in her ferritin from last infusion.  However her hemoglobin is normal.  Because of her tiredness, we will give her 1 more infusion of Feraheme next week.  We will see her back in 3 months with repeat blood work.

## 2018-05-17 NOTE — Patient Instructions (Signed)
[facility] Discharge Instructions  You saw Dr. Katragadda today.   Thank you for choosing [facility] to provide your oncology and hematology care.  To afford each patient quality time with our provider, please arrive at least 15 minutes before your scheduled appointment time.   If you have a lab appointment with the Cancer Center please come in thru the  Main Entrance and check in at the main information desk  You need to re-schedule your appointment should you arrive 10 or more minutes late.  We strive to give you quality time with our providers, and arriving late affects you and other patients whose appointments are after yours.  Also, if you no show three or more times for appointments you may be dismissed from the clinic at the providers discretion.     Again, thank you for choosing [facility].  Our hope is that these requests will decrease the amount of time that you wait before being seen by our physicians.       _____________________________________________________________  Should you have questions after your visit to [facility], please contact our office at [phone] between the hours of 8:30 a.m. and 4:30 p.m.  Voicemails left after 4:30 p.m. will not be returned until the following business day.  For prescription refill requests, have your pharmacy contact our office.       Resources For Cancer Patients and their Caregivers ? American Cancer Society: Can assist with transportation, wigs, general needs, runs Look Good Feel Better.        1-888-227-6333 ? Cancer Care: Provides financial assistance, online support groups, medication/co-pay assistance.  1-800-813-HOPE (4673) ? [facility] Assists [place] cancer patients and their families through emotional , educational and financial support.  336-427-4357 ? [place] DSS Where to apply for  food stamps, Medicaid and utility assistance. 336-342-1394 ? [facility]: Transportation to medical appointments. 336-347-2287 ? Social Security Administration: May apply for disability if have a Stage IV cancer. 336-342-7796 1-800-772-1213 ? [place] Aging, Disability and Transit Services: Assists with nutrition, care and transit needs. 336-349-2343  Cancer Center Support Programs:   > Cancer Support Group  2nd Tuesday of the month 1pm-2pm, Journey Room   > Creative Journey  3rd Tuesday of the month 1130am-1pm, Journey Room     

## 2018-05-17 NOTE — Progress Notes (Signed)
[place] [place], [place] [address]   CLINIC:  Medical Oncology/Hematology  PCP:  Celene Squibb, MD [place] [address] [phone]   REASON FOR VISIT:  Follow-up for iron deficiency state.  CURRENT THERAPY: Intermittent Feraheme infusions.   INTERVAL HISTORY:  [name] 84 y.o. female returns for follow-up of iron deficiency state.  Last Feraheme infusion was on 03/25/2018.  She denies any bleeding per rectum or melena.  Denies any recent hospitalizations.  Denies any fevers, night sweats or weight loss in the last 3 to 6 months.  Complains of fatigue and easy shortness of breath on exertion.  Leg swellings have been stable.  Numbness in the feet is also stable.    REVIEW OF SYSTEMS:  Review of Systems  Constitutional: Positive for fatigue.  Respiratory: Positive for shortness of breath.   Gastrointestinal: Positive for constipation.  Neurological: Positive for numbness.  Hematological: Bruises/bleeds easily.  Psychiatric/Behavioral: Positive for sleep disturbance.  All other systems reviewed and are negative.    PAST MEDICAL/SURGICAL HISTORY:  Past Medical History:  Diagnosis Date  . Anemia   . Chronic bronchitis ([place])   . GAVE (gastric antral vascular ectasia) 09/12/12  . Hypertension   . Hypothyroidism   . Iron deficiency anemia 10/15/2012  . Iron deficiency anemia due to chronic blood loss 04/20/2014  . On home oxygen therapy    "2L/[place] at night" (11/07/2016)  . Palpitations   . Pneumonia 06/2016; 10/2016  . Syncope and collapse 11/06/2016   Past Surgical History:  Procedure Laterality Date  . BACK SURGERY    . BIOPSY  12/27/2016   Procedure: BIOPSY;  Surgeon: Rogene Houston, MD;  [facility];  Service: Endoscopy;;  gastric  . COLONOSCOPY  07/24/2012   Procedure: COLONOSCOPY;  Surgeon: Rogene Houston, MD;  [facility];  Service: Endoscopy;  Laterality: N/A;  200  . DILATION AND  CURETTAGE OF UTERUS    . ESOPHAGOGASTRODUODENOSCOPY  09/12/2012   Procedure: ESOPHAGOGASTRODUODENOSCOPY (EGD);  Surgeon: Rogene Houston, MD;  [facility];  Service: Endoscopy;  Laterality: N/A;  325-changed to 200 per Ann-pt moved up to [place] to notify pt  . ESOPHAGOGASTRODUODENOSCOPY  11/01/2012   Procedure: ESOPHAGOGASTRODUODENOSCOPY (EGD);  Surgeon: Rogene Houston, MD;  [facility];  Service: Endoscopy;  Laterality: N/A;  1:20  . ESOPHAGOGASTRODUODENOSCOPY N/A 07/04/2013   Procedure: ESOPHAGOGASTRODUODENOSCOPY (EGD);  Surgeon: Rogene Houston, MD;  [facility];  Service: Endoscopy;  Laterality: N/A;  850  . ESOPHAGOGASTRODUODENOSCOPY N/A 08/06/2013   Procedure: ESOPHAGOGASTRODUODENOSCOPY (EGD);  Surgeon: Rogene Houston, MD;  [facility];  Service: Endoscopy;  Laterality: N/A;  1200  . ESOPHAGOGASTRODUODENOSCOPY N/A 10/31/2013   Procedure: ESOPHAGOGASTRODUODENOSCOPY (EGD);  Surgeon: Rogene Houston, MD;  [facility];  Service: Endoscopy;  Laterality: N/A;  125-moved to [place] notified pt  . ESOPHAGOGASTRODUODENOSCOPY N/A 10/23/2014   Procedure: ESOPHAGOGASTRODUODENOSCOPY (EGD);  Surgeon: Rogene Houston, MD;  [facility];  Service: Endoscopy;  Laterality: N/A;  1020  . ESOPHAGOGASTRODUODENOSCOPY N/A 10/11/2016   Procedure: ESOPHAGOGASTRODUODENOSCOPY (EGD);  Surgeon: Rogene Houston, MD;  [facility];  Service: Endoscopy;  Laterality: N/A;  245  . ESOPHAGOGASTRODUODENOSCOPY N/A 12/27/2016   Procedure: ESOPHAGOGASTRODUODENOSCOPY (EGD);  Surgeon: Rogene Houston, MD;  [facility];  Service: Endoscopy;  Laterality: N/A;  1225  . HEEL SPUR SURGERY Bilateral   .  HOT HEMOSTASIS  11/01/2012   Procedure: HOT HEMOSTASIS (ARGON PLASMA COAGULATION/BICAP);  Surgeon: Rogene Houston, MD;  [facility];  Service: Endoscopy;  Laterality: N/A;  . HOT HEMOSTASIS N/A 07/04/2013   Procedure: HOT  HEMOSTASIS (ARGON PLASMA COAGULATION/BICAP);  Surgeon: Rogene Houston, MD;  [facility];  Service: Endoscopy;  Laterality: N/A;  . HOT HEMOSTASIS N/A 08/06/2013   Procedure: HOT HEMOSTASIS (ARGON PLASMA COAGULATION/BICAP);  Surgeon: Rogene Houston, MD;  [facility];  Service: Endoscopy;  Laterality: N/A;  . HOT HEMOSTASIS N/A 10/31/2013   Procedure: HOT HEMOSTASIS (ARGON PLASMA COAGULATION/BICAP);  Surgeon: Rogene Houston, MD;  [facility];  Service: Endoscopy;  Laterality: N/A;  . HOT HEMOSTASIS N/A 10/23/2014   Procedure: HOT HEMOSTASIS (ARGON PLASMA COAGULATION/BICAP);  Surgeon: Rogene Houston, MD;  [facility];  Service: Endoscopy;  Laterality: N/A;  . HOT HEMOSTASIS N/A 10/11/2016   Procedure: HOT HEMOSTASIS (ARGON PLASMA COAGULATION/BICAP);  Surgeon: Rogene Houston, MD;  [facility];  Service: Endoscopy;  Laterality: N/A;  . JOINT REPLACEMENT    . KNEE ARTHROSCOPY Right    "before replacement"  . LUMBAR DISC SURGERY     "bone spur  . LUMBAR FUSION  06/25/2014   L4 & L5  . SHOULDER SURGERY Right    "spur"  . TONSILLECTOMY AND ADENOIDECTOMY    . TOTAL ABDOMINAL HYSTERECTOMY    . TOTAL KNEE ARTHROPLASTY Right   . US ECHOCARDIOGRAPHY  03/12/2006   EF 55-60%     SOCIAL HISTORY:  Social History   Socioeconomic History  . Marital status: Widowed    Spouse name: Not on file  . Number of children: Not on file  . Years of education: Not on file  . Highest education level: Not on file  Occupational History  . Not on file  Social Needs  . Financial resource strain: Not on file  . Food insecurity:    Worry: Not on file    Inability: Not on file  . Transportation needs:    Medical: Not on file    Non-medical: Not on file  Tobacco Use  . Smoking status: Never Smoker  . Smokeless tobacco: Never Used  Substance and Sexual Activity  . Alcohol use: No  . Drug use: No  . Sexual activity: Never  Lifestyle  . Physical  activity:    Days per week: Not on file    Minutes per session: Not on file  . Stress: Not on file  Relationships  . Social connections:    Talks on phone: Not on file    Gets together: Not on file    Attends religious service: Not on file    Active member of club or organization: Not on file    Attends meetings of clubs or organizations: Not on file    Relationship status: Not on file  . Intimate partner violence:    Fear of current or ex partner: Not on file    Emotionally abused: Not on file    Physically abused: Not on file    Forced sexual activity: Not on file  Other Topics Concern  . Not on file  Social History Narrative  . Not on file    FAMILY HISTORY:  Family History  Problem Relation Age of Onset  . Hypertension Mother   . Hypertension Father   . Heart attack Father   . Other Sister        aneurysm  on heart  . Other Brother        aneurysm on heart  . Breast cancer Neg Hx     CURRENT MEDICATIONS:  Outpatient Encounter Medications as of 05/17/2018  Medication Sig  . albuterol (PROVENTIL) (2.5 MG/3ML) 0.083% nebulizer solution Take 2.5 mg by nebulization every 6 (six) hours as needed for wheezing or shortness of breath.  . cyanocobalamin 1000 MCG tablet Take 1,000 mcg by mouth daily.  . digoxin (DIGOX) 0.125 MG tablet Take 1 tablet mouth daily except none on Sundays or as directed  . feeding supplement, ENSURE ENLIVE, (ENSURE ENLIVE) LIQD Take 237 mLs by mouth 2 (two) times daily between meals.  . furosemide (LASIX) 20 MG tablet Take 1 tablet (20 mg total) by mouth daily.  . hydrALAZINE (APRESOLINE) 10 MG tablet Take 1 tablet (10 mg total) by mouth 2 (two) times daily.  Marland Kitchen levothyroxine (SYNTHROID, LEVOTHROID) 112 MCG tablet Take 112 mcg by mouth daily before breakfast.  . metoprolol tartrate (LOPRESSOR) 25 MG tablet Take 1 tablet (25 mg total) by mouth 2 (two) times daily.  . pantoprazole (PROTONIX) 40 MG tablet TK 1 T PO D  . senna (SENOKOT) 8.6 MG tablet Take  1 tablet by mouth as needed for constipation.  . temazepam (RESTORIL) 15 MG capsule TK 1 C PO QHS FOR SLP   Facility-Administered Encounter Medications as of 05/17/2018  Medication  . 0.9 %  sodium chloride infusion    ALLERGIES:  Allergies  Allergen Reactions  . Flagyl [Metronidazole] Diarrhea, Nausea Only and Other (See Comments)    Dizziness and feeling of inbalance  . Avapro [Irbesartan] Other (See Comments)    Cough   . Clarithromycin Other (See Comments)    Unknown   . Dexamethasone     UPSET STOMACH  . Losartan Potassium-Hctz Other (See Comments)    Unknown  . Maxzide [Hydrochlorothiazide W-Triamterene] Other (See Comments)    Unknown   . Ziac [Bisoprolol-Hydrochlorothiazide]     Thinks caused itching and cough 08/12/12  . Penicillins Swelling and Rash    Has patient had a PCN reaction causing immediate rash, facial/tongue/throat swelling, SOB or lightheadedness with hypotension: no Has patient had a PCN reaction causing severe rash involving mucus membranes or skin necrosis: no Has patient had a PCN reaction that required hospitalization {no Has patient had a PCN reaction occurring within the last 10 years: {no If all of the above answers are "NO", then may proceed with Cephalosporin use.  . Sulfa Drugs Cross Reactors Rash     PHYSICAL EXAM:  ECOG Performance status: 1  Vitals:   05/17/18 1037  BP: (!) 148/57  Pulse: 68  Resp: 16  Temp: 97.7 F (36.5 C)  SpO2: 90%   Filed Weights   05/17/18 1037  Weight: 114 lb 12.8 oz (52.1 kg)    Physical Exam   LABORATORY DATA:  I have reviewed the labs as listed.  CBC    Component Value Date/Time   WBC 10.2 05/15/2018 1035   RBC 4.26 05/15/2018 1035   HGB 13.5 05/15/2018 1035   HCT 42.1 05/15/2018 1035   PLT 319 05/15/2018 1035   MCV 98.8 05/15/2018 1035   MCH 31.7 05/15/2018 1035   MCHC 32.1 05/15/2018 1035   RDW 14.4 05/15/2018 1035   LYMPHSABS 1.1 05/15/2018 1035   MONOABS 1.0 05/15/2018 1035   EOSABS  0.2 05/15/2018 1035   BASOSABS 0.1 05/15/2018 1035   CMP Latest Ref Rng & Units 05/15/2018 03/15/2018 01/04/2018  Glucose  70 - 99 mg/dL 82 104(H) 73  BUN 8 - 23 mg/dL 27(H) 31(H) 18  Creatinine 0.44 - 1.00 mg/dL 0.88 0.94 0.87  Sodium 135 - 145 mmol/L 134(L) 135 133(L)  Potassium 3.5 - 5.1 mmol/L 4.1 4.2 3.7  Chloride 98 - 111 mmol/L 92(L) 94(L) 92(L)  CO2 22 - 32 mmol/L 35(H) 31 32  Calcium 8.9 - 10.3 mg/dL 9.0 9.3 9.1  Total Protein 6.5 - 8.1 g/dL 6.7 6.8 6.5  Total Bilirubin 0.3 - 1.2 mg/dL 0.5 0.6 0.6  Alkaline Phos 38 - 126 U/L 87 86 92  AST 15 - 41 U/L 26 24 21   ALT 0 - 44 U/L 21 19 15         ASSESSMENT & PLAN:   Iron deficiency anemia due to chronic blood loss 1.  Iron deficiency state: - History of gastric antral vascular ectasia, last EGD on 12/27/2016 showing normal esophagus, moderate GAVE with bleeding in the antrum and prepyloric region, status post APC -Denies any bleeding per rectum or melena.  Complains of feeling tired.  Ferritin level today was 50.  Last Feraheme infusion was on 03/25/2018.  She did not get much improvement in her ferritin from last infusion.  However her hemoglobin is normal.  Because of her tiredness, we will give her 1 more infusion of Feraheme next week.  We will see her back in 3 months with repeat blood work.      Orders placed this encounter:  Orders Placed This Encounter  Procedures  . CBC with Differential/Platelet  . Comprehensive metabolic panel  . Ferritin  . Iron and TIBC      Derek Jack, MD [place] [phone]

## 2018-05-20 ENCOUNTER — Encounter (HOSPITAL_COMMUNITY): Payer: Self-pay

## 2018-05-20 ENCOUNTER — Inpatient Hospital Stay (HOSPITAL_COMMUNITY): Payer: Medicare Other

## 2018-05-20 DIAGNOSIS — R200 Anesthesia of skin: Secondary | ICD-10-CM | POA: Diagnosis not present

## 2018-05-20 DIAGNOSIS — D500 Iron deficiency anemia secondary to blood loss (chronic): Secondary | ICD-10-CM | POA: Diagnosis not present

## 2018-05-20 DIAGNOSIS — R0609 Other forms of dyspnea: Secondary | ICD-10-CM | POA: Diagnosis not present

## 2018-05-20 DIAGNOSIS — R5383 Other fatigue: Secondary | ICD-10-CM | POA: Diagnosis not present

## 2018-05-20 MED ORDER — SODIUM CHLORIDE 0.9 % IV SOLN
INTRAVENOUS | Status: DC
Start: 2018-05-20 — End: 2018-05-20
  Administered 2018-05-20: 14:00:00 via INTRAVENOUS

## 2018-05-20 MED ORDER — SODIUM CHLORIDE 0.9 % IV SOLN
510.0000 mg | Freq: Once | INTRAVENOUS | Status: AC
  Administered 2018-05-20: 510 mg via INTRAVENOUS
  Filled 2018-05-20: qty 17

## 2018-05-20 NOTE — Progress Notes (Signed)
Patient tolerated iron infusion with no complaints voiced.  Good blood return noted before and after administration of iron.  Peripheral IV site clean and dry with no bruising or swelling noted.  Band aid applied.  VSS with discharge and left ambulatory with no s/s of distress noted.

## 2018-05-20 NOTE — Patient Instructions (Signed)
[facility]  Discharge Instructions:  You received an iron infusion today.  _______________________________________________________________  Thank you for choosing [facility] to provide your oncology and hematology care.  To afford each patient quality time with our providers, please arrive at least 15 minutes before your scheduled appointment.  You need to re-schedule your appointment if you arrive 10 or more minutes late.  We strive to give you quality time with our providers, and arriving late affects you and other patients whose appointments are after yours.  Also, if you no show three or more times for appointments you may be dismissed from the clinic.  Again, thank you for choosing [facility]. Our hope is that these requests will allow you access to exceptional care and in a timely manner. _______________________________________________________________  If you have questions after your visit, please contact our office at [phone] between the hours of 8:30 a.m. and 5:00 p.m. Voicemails left after 4:30 p.m. will not be returned until the following business day. _______________________________________________________________  For prescription refill requests, have your pharmacy contact our office. _______________________________________________________________  Recommendations made by the consultant and any test results will be sent to your referring physician. _______________________________________________________________ 

## 2018-05-21 ENCOUNTER — Ambulatory Visit (HOSPITAL_COMMUNITY): Payer: Medicare Other

## 2018-06-04 ENCOUNTER — Ambulatory Visit (INDEPENDENT_AMBULATORY_CARE_PROVIDER_SITE_OTHER): Payer: Medicare Other | Admitting: Internal Medicine

## 2018-06-04 ENCOUNTER — Encounter (INDEPENDENT_AMBULATORY_CARE_PROVIDER_SITE_OTHER): Payer: Self-pay | Admitting: Internal Medicine

## 2018-06-04 VITALS — BP 124/80 | HR 64 | Temp 98.2°F | Resp 18 | Ht 61.0 in | Wt 114.3 lb

## 2018-06-04 DIAGNOSIS — K31819 Angiodysplasia of stomach and duodenum without bleeding: Secondary | ICD-10-CM

## 2018-06-04 DIAGNOSIS — R634 Abnormal weight loss: Secondary | ICD-10-CM | POA: Diagnosis not present

## 2018-06-04 DIAGNOSIS — D500 Iron deficiency anemia secondary to blood loss (chronic): Secondary | ICD-10-CM

## 2018-06-04 NOTE — Patient Instructions (Signed)
Daily physical activity as discussed. Weight check in 2 months. Notify if you have tarry stools.

## 2018-06-04 NOTE — Progress Notes (Signed)
Presenting complaint;  History of gave the GI bleed/iron deficiency anemia. Weight loss.  Database and subjective:  Patient is 84 year old Caucasian female who is here for scheduled visit accompanied by her daughter Langley Gauss.  She was last seen 6 months ago.  She has a history of gastric antral vascular ectasia dating back to 2013.  Last EGD with APC therapy was in February 2018.  She states she has not had melena recently.  She is receiving intermittent iron infusion via oncology clinic.  She had a dose yesterday.  Her appetite remains fair.  She feels she is unable to eat because of her dental problems.  She is hoping to get her dentures within the next few weeks.  She is drinking 3 cans of Ensure daily while she is not able to eat.  She denies abdominal pain.  She also denies nausea vomiting fever or chills.  She does not take OTC NSAIDs    Current Medications: Outpatient Encounter Medications as of 06/04/2018  Medication Sig  . albuterol (PROVENTIL) (2.5 MG/3ML) 0.083% nebulizer solution Take 2.5 mg by nebulization every 6 (six) hours as needed for wheezing or shortness of breath.  . cyanocobalamin 1000 MCG tablet Take 1,000 mcg by mouth daily.  . digoxin (DIGOX) 0.125 MG tablet Take 1 tablet mouth daily except none on Sundays or as directed  . feeding supplement, ENSURE ENLIVE, (ENSURE ENLIVE) LIQD Take 237 mLs by mouth 2 (two) times daily between meals.  . furosemide (LASIX) 20 MG tablet Take 1 tablet (20 mg total) by mouth daily.  . hydrALAZINE (APRESOLINE) 10 MG tablet Take 1 tablet (10 mg total) by mouth 2 (two) times daily.  Marland Kitchen levothyroxine (SYNTHROID, LEVOTHROID) 112 MCG tablet Take 112 mcg by mouth daily before breakfast.  . metoprolol tartrate (LOPRESSOR) 25 MG tablet Take 1 tablet (25 mg total) by mouth 2 (two) times daily.  . pantoprazole (PROTONIX) 40 MG tablet TK 1 T PO D  . senna (SENOKOT) 8.6 MG tablet Take 1 tablet by mouth as needed for constipation.  . temazepam (RESTORIL)  15 MG capsule TK 1 C PO QHS FOR SLP   Facility-Administered Encounter Medications as of 06/04/2018  Medication  . 0.9 %  sodium chloride infusion     Objective: Blood pressure 124/80, pulse 64, temperature 98.2 F (36.8 C), resp. rate 18, height 5\' 1"  (1.549 m), weight 114 lb 4.8 oz (51.8 kg). Well-developed thin Caucasian female in NAD. Conjunctiva is pink. Sclera is nonicteric Oropharyngeal mucosa is normal. No neck masses or thyromegaly noted. Cardiac exam with regular rhythm normal S1 and S2.  Grade 2/6 systolic ejection murmur best heard at upper left sternal border. Lungs are clear to auscultation. Abdomen is symmetrical and soft.  Liver edge is palpable.  No organomegaly or masses. No LE edema or clubbing noted.  Labs/studies Results: Lab data from 05/15/2018 WBC 10.2, H&H 13.5 and 42.1 and platelet count 319K Serum albumin 3.0  serum iron 56 TIBC 312 saturation 18% and ferritin 50  Assessment:  #1.  Iron deficiency anemia secondary to gastric antral vascular ectasia for which she is undergone APC therapy on multiple occasions.  Last EGD was in February 2018.  She is maintaining her H&H and iron stores with intermittent iron infusion.  #2.  Weight loss.  Weight loss is clearly due to poor appetite and it remains to be seen if her appetite will improve once her dentures are fixed.  At least she has not lost any weight in the last 6  months.   Plan:  Patient will call if she has melena or rectal bleeding. Patient advised to increase physical activity as tolerated as it may help improve her appetite if nothing else. Weight check in 2 months. Office visit in 1 year.

## 2018-06-10 ENCOUNTER — Ambulatory Visit
Admission: RE | Admit: 2018-06-10 | Discharge: 2018-06-10 | Disposition: A | Discharge status: Home / Self Care | Payer: Medicare Other | Source: Ambulatory Visit | Attending: Internal Medicine | Admitting: Internal Medicine

## 2018-06-10 ENCOUNTER — Other Ambulatory Visit: Payer: Self-pay | Admitting: Internal Medicine

## 2018-06-10 DIAGNOSIS — Z1231 Encounter for screening mammogram for malignant neoplasm of breast: Secondary | ICD-10-CM | POA: Diagnosis not present

## 2018-06-26 DIAGNOSIS — L570 Actinic keratosis: Secondary | ICD-10-CM | POA: Diagnosis not present

## 2018-06-26 DIAGNOSIS — L281 Prurigo nodularis: Secondary | ICD-10-CM | POA: Diagnosis not present

## 2018-07-05 IMAGING — CR DG CHEST 2V
3 series · 3 of 3 positions shown · non-contrast
Comparison: 11/07/2016 and earlier.

CLINICAL DATA: 83-year-old female with chest pains, pleuritic pain.
Bilateral pneumonia suspected on chest radiographs yesterday.
Initial encounter.

EXAM:
CHEST  2 VIEW

[chest lat (1 of 2)]
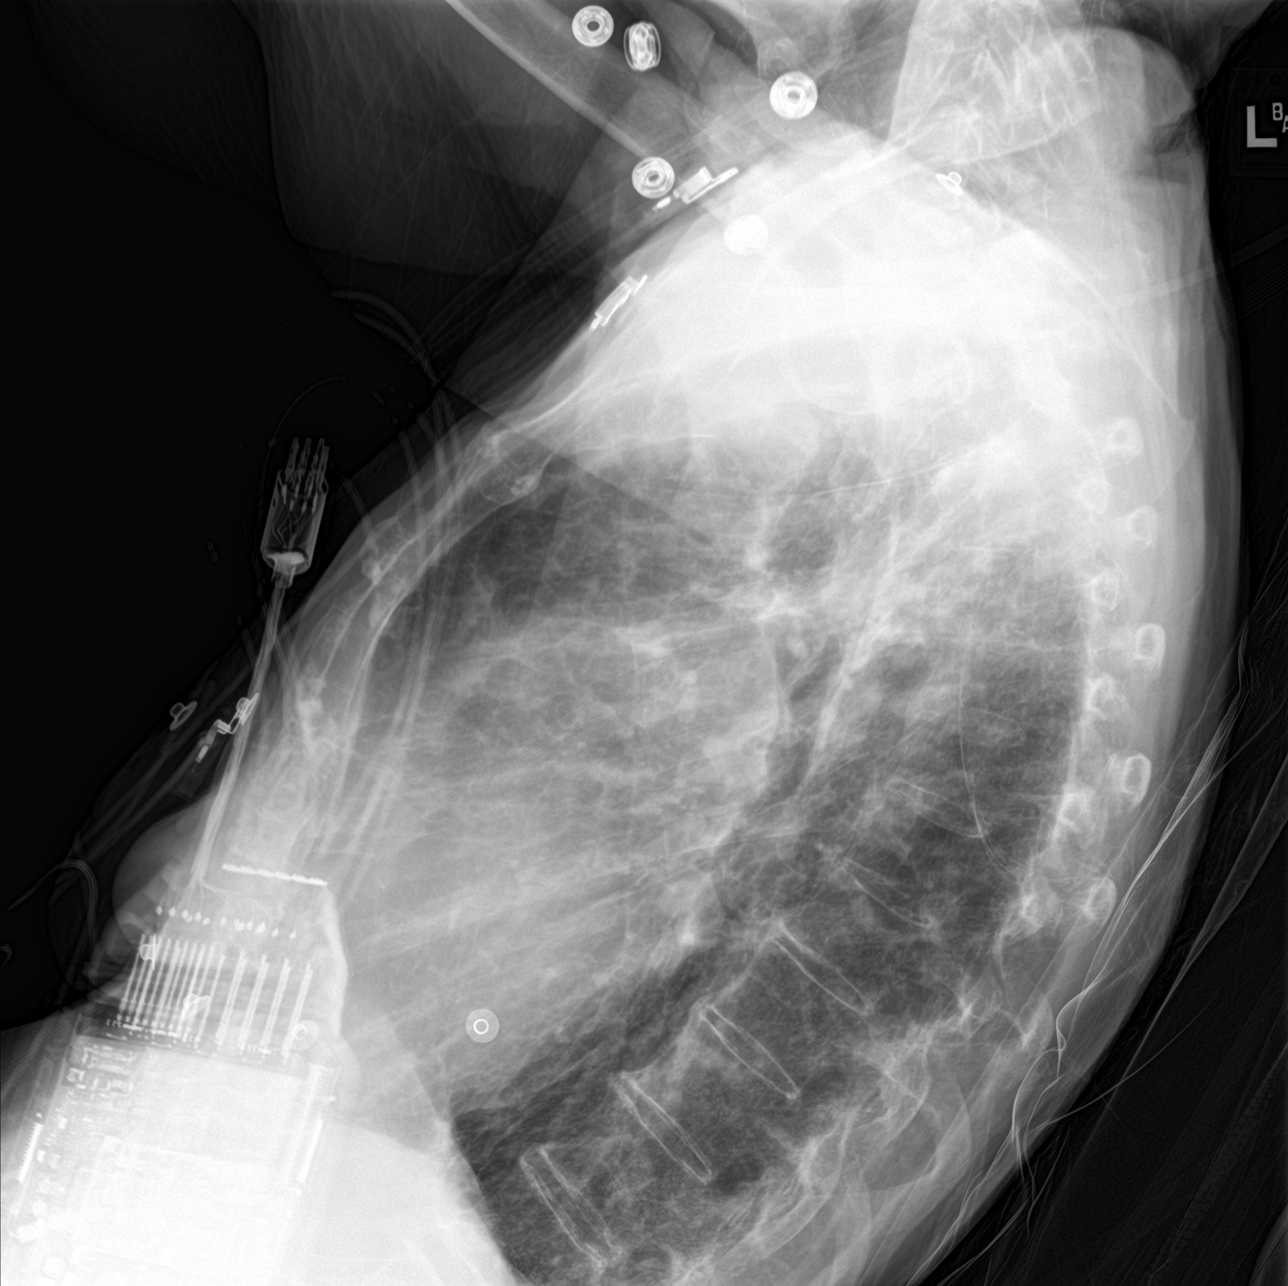

[chest ap]
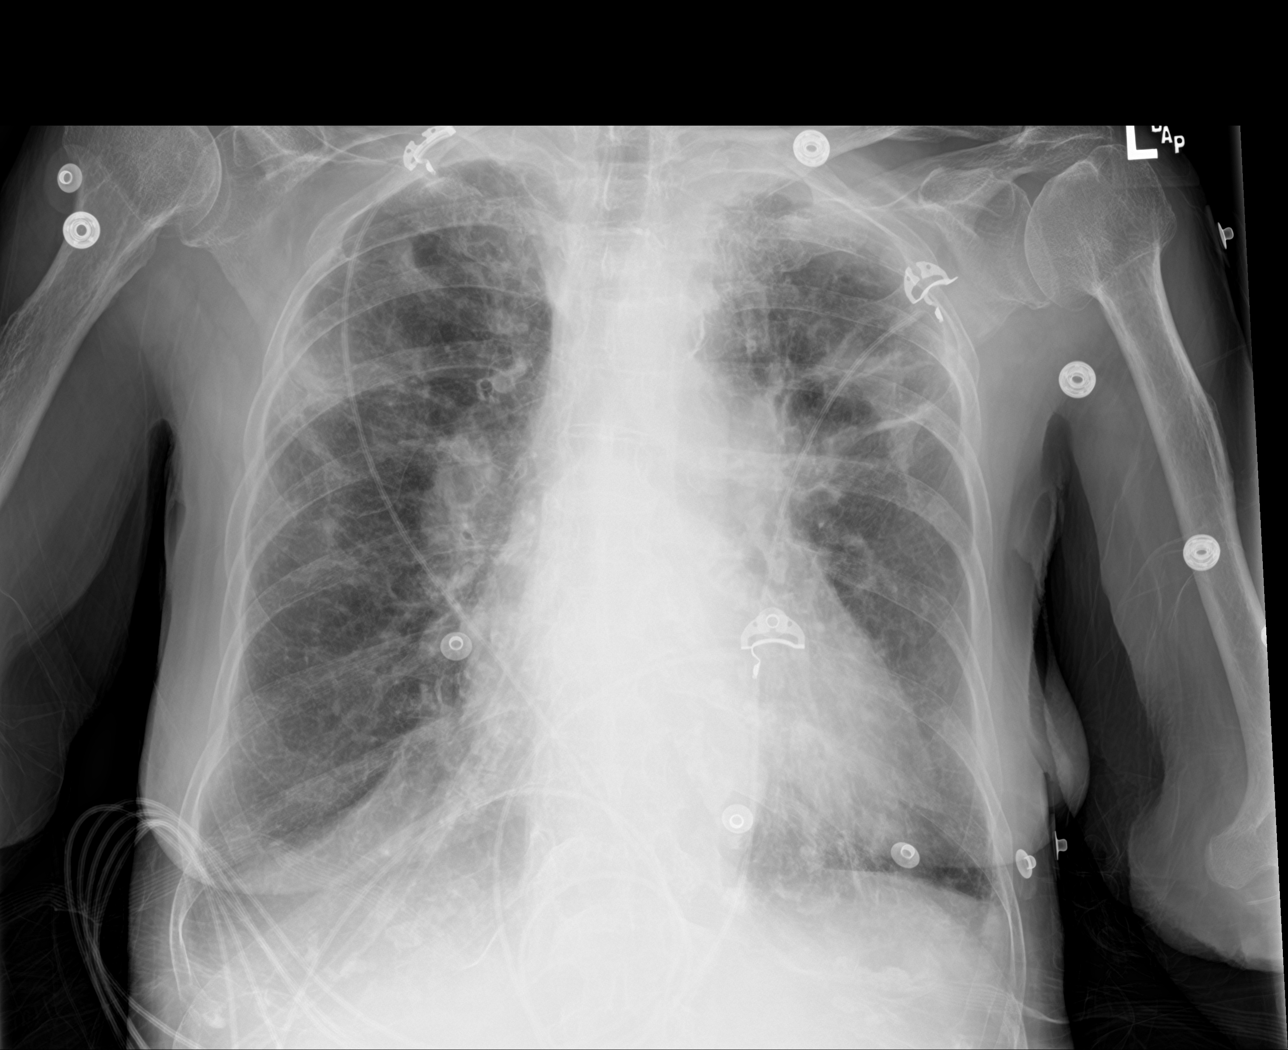

[chest lat (2 of 2)]
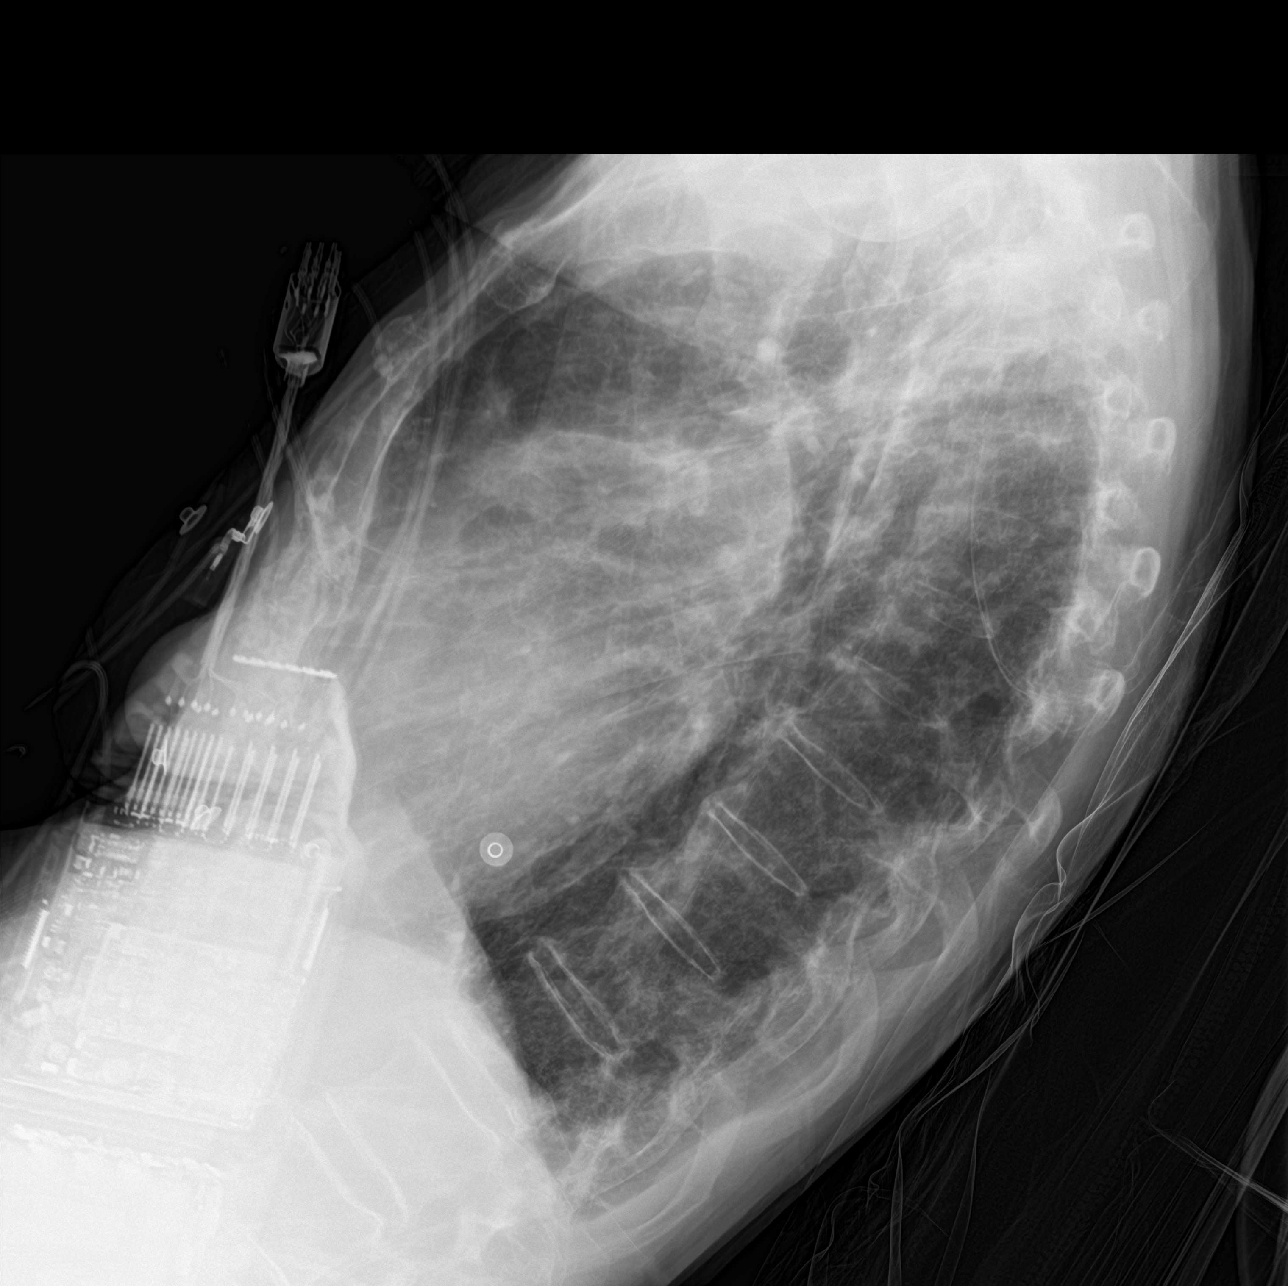

[3 of 3 positions shown; findings below may reference images not displayed]

FINDINGS: Stable patchy bilateral upper lobe opacity, new since [REDACTED],
compatible with bilateral pneumonia. No pleural effusion. No
pulmonary edema. No other acute pulmonary opacity. Stable
cardiomegaly and mediastinal contours. Calcified aortic
atherosclerosis. Visualized tracheal air column is within normal
limits. No pneumothorax. Osteopenia. Stable visualized osseous
structures.
IMPRESSION: 1. Patchy bilateral upper lobe opacity is unchanged since yesterday.
This is nonspecific but compatible with bilateral pneumonia. No
pleural effusion.
2. No other acute cardiopulmonary abnormality.
3.  Calcified aortic atherosclerosis.

## 2018-07-09 ENCOUNTER — Other Ambulatory Visit: Payer: Self-pay | Admitting: Physician Assistant

## 2018-07-09 NOTE — Telephone Encounter (Signed)
Rx sent to pharmacy   

## 2018-08-12 ENCOUNTER — Other Ambulatory Visit (HOSPITAL_COMMUNITY): Payer: Medicare Other

## 2018-08-13 ENCOUNTER — Inpatient Hospital Stay (HOSPITAL_COMMUNITY): Payer: Medicare Other | Attending: Nurse Practitioner

## 2018-08-13 DIAGNOSIS — Z79899 Other long term (current) drug therapy: Secondary | ICD-10-CM | POA: Diagnosis not present

## 2018-08-13 DIAGNOSIS — Z23 Encounter for immunization: Secondary | ICD-10-CM | POA: Diagnosis not present

## 2018-08-13 DIAGNOSIS — K31811 Angiodysplasia of stomach and duodenum with bleeding: Secondary | ICD-10-CM | POA: Diagnosis not present

## 2018-08-13 DIAGNOSIS — D509 Iron deficiency anemia, unspecified: Secondary | ICD-10-CM | POA: Insufficient documentation

## 2018-08-13 DIAGNOSIS — D500 Iron deficiency anemia secondary to blood loss (chronic): Secondary | ICD-10-CM

## 2018-08-13 LAB — CBC WITH DIFFERENTIAL/PLATELET
BASOS ABS: 0 10*3/uL (ref 0.0–0.1)
BASOS PCT: 0 %
EOS PCT: 1 %
Eosinophils Absolute: 0.1 10*3/uL (ref 0.0–0.7)
HCT: 42.4 % (ref 36.0–46.0)
Hemoglobin: 13.7 g/dL (ref 12.0–15.0)
LYMPHS PCT: 5 %
Lymphs Abs: 0.6 10*3/uL — ABNORMAL LOW (ref 0.7–4.0)
MCH: 32.3 pg (ref 26.0–34.0)
MCHC: 32.3 g/dL (ref 30.0–36.0)
MCV: 100 fL (ref 78.0–100.0)
Monocytes Absolute: 1.7 10*3/uL — ABNORMAL HIGH (ref 0.1–1.0)
Monocytes Relative: 15 %
NEUTROS ABS: 9.4 10*3/uL — AB (ref 1.7–7.7)
Neutrophils Relative %: 79 %
PLATELETS: 304 10*3/uL (ref 150–400)
RBC: 4.24 MIL/uL (ref 3.87–5.11)
RDW: 14.3 % (ref 11.5–15.5)
WBC: 11.8 10*3/uL — AB (ref 4.0–10.5)

## 2018-08-13 LAB — COMPREHENSIVE METABOLIC PANEL
ALBUMIN: 3.3 g/dL — AB (ref 3.5–5.0)
ALT: 18 U/L (ref 0–44)
AST: 23 U/L (ref 15–41)
Alkaline Phosphatase: 69 U/L (ref 38–126)
Anion gap: 9 (ref 5–15)
BUN: 27 mg/dL — AB (ref 8–23)
CHLORIDE: 91 mmol/L — AB (ref 98–111)
CO2: 34 mmol/L — AB (ref 22–32)
CREATININE: 0.94 mg/dL (ref 0.44–1.00)
Calcium: 9.1 mg/dL (ref 8.9–10.3)
GFR calc Af Amer: >60 mL/min (ref >60)
GFR calc non Af Amer: 55 mL/min — ABNORMAL LOW (ref >60)
GLUCOSE: 94 mg/dL (ref 70–99)
POTASSIUM: 4.4 mmol/L (ref 3.5–5.1)
SODIUM: 134 mmol/L — AB (ref 135–145)
Total Bilirubin: 0.6 mg/dL (ref 0.3–1.2)
Total Protein: 7 g/dL (ref 6.5–8.1)

## 2018-08-13 LAB — IRON AND TIBC
Iron: 80 ug/dL (ref 28–170)
Saturation Ratios: 20 % (ref 10.4–31.8)
TIBC: 405 ug/dL (ref 250–450)
UIBC: 325 ug/dL

## 2018-08-13 LAB — FERRITIN: FERRITIN: 22 ng/mL (ref 11–307)

## 2018-08-19 ENCOUNTER — Inpatient Hospital Stay (HOSPITAL_BASED_OUTPATIENT_CLINIC_OR_DEPARTMENT_OTHER): Payer: Medicare Other | Admitting: Hematology

## 2018-08-19 ENCOUNTER — Encounter (HOSPITAL_COMMUNITY): Payer: Self-pay | Admitting: Hematology

## 2018-08-19 VITALS — BP 148/50 | HR 77 | Temp 98.2°F | Resp 20 | Wt 111.6 lb

## 2018-08-19 DIAGNOSIS — K5900 Constipation, unspecified: Secondary | ICD-10-CM | POA: Diagnosis not present

## 2018-08-19 DIAGNOSIS — D500 Iron deficiency anemia secondary to blood loss (chronic): Secondary | ICD-10-CM | POA: Diagnosis not present

## 2018-08-19 DIAGNOSIS — R630 Anorexia: Secondary | ICD-10-CM | POA: Diagnosis not present

## 2018-08-19 DIAGNOSIS — K31811 Angiodysplasia of stomach and duodenum with bleeding: Secondary | ICD-10-CM | POA: Diagnosis not present

## 2018-08-19 DIAGNOSIS — R600 Localized edema: Secondary | ICD-10-CM | POA: Diagnosis not present

## 2018-08-19 DIAGNOSIS — D509 Iron deficiency anemia, unspecified: Secondary | ICD-10-CM | POA: Diagnosis not present

## 2018-08-19 DIAGNOSIS — Z79899 Other long term (current) drug therapy: Secondary | ICD-10-CM | POA: Diagnosis not present

## 2018-08-19 DIAGNOSIS — Z23 Encounter for immunization: Secondary | ICD-10-CM | POA: Diagnosis not present

## 2018-08-19 DIAGNOSIS — R5383 Other fatigue: Secondary | ICD-10-CM | POA: Diagnosis not present

## 2018-08-19 DIAGNOSIS — R197 Diarrhea, unspecified: Secondary | ICD-10-CM | POA: Diagnosis not present

## 2018-08-19 DIAGNOSIS — K31819 Angiodysplasia of stomach and duodenum without bleeding: Secondary | ICD-10-CM

## 2018-08-19 NOTE — Progress Notes (Signed)
Eugenio Saenz [place], [place] [address]   CLINIC:  Medical Oncology/Hematology  PCP:  Celene Squibb, MD [place] [address] [phone]   REASON FOR VISIT: Follow-up for iron deficiency anemia  CURRENT THERAPY: intermittent feraheme infusions   INTERVAL HISTORY:  Ms. Boehm 84 y.o. female returns for routine follow-up for iron deficiency anemia. Patient is here today with family. She is more fatigued and has no energy to do any daily activities. She has intermittent diarrhea or constipation. She also has lower extremity edema. Her appetite is decreased and she has lost 3 pounds since her last visit. She denies any nausea, vomiting or diarrhea. Denies any headaches or vision changes. Denies any dark stools or blood in her stool. Denies any SOB.    REVIEW OF SYSTEMS:  Review of Systems  Constitutional: Positive for fatigue.  Gastrointestinal: Positive for constipation and diarrhea.  All other systems reviewed and are negative.    PAST MEDICAL/SURGICAL HISTORY:  Past Medical History:  Diagnosis Date  . Anemia   . Chronic bronchitis ([place])   . GAVE (gastric antral vascular ectasia) 09/12/12  . Hypertension   . Hypothyroidism   . Iron deficiency anemia 10/15/2012  . Iron deficiency anemia due to chronic blood loss 04/20/2014  . On home oxygen therapy    "2L/[place] at night" (11/07/2016)  . Palpitations   . Pneumonia 06/2016; 10/2016  . Syncope and collapse 11/06/2016   Past Surgical History:  Procedure Laterality Date  . BACK SURGERY    . BIOPSY  12/27/2016   Procedure: BIOPSY;  Surgeon: Rogene Houston, MD;  [facility];  Service: Endoscopy;;  gastric  . COLONOSCOPY  07/24/2012   Procedure: COLONOSCOPY;  Surgeon: Rogene Houston, MD;  [facility];  Service: Endoscopy;  Laterality: N/A;  200  . DILATION AND CURETTAGE OF UTERUS    . ESOPHAGOGASTRODUODENOSCOPY  09/12/2012   Procedure:  ESOPHAGOGASTRODUODENOSCOPY (EGD);  Surgeon: Rogene Houston, MD;  [facility];  Service: Endoscopy;  Laterality: N/A;  325-changed to 200 per Ann-pt moved up to [place] to notify pt  . ESOPHAGOGASTRODUODENOSCOPY  11/01/2012   Procedure: ESOPHAGOGASTRODUODENOSCOPY (EGD);  Surgeon: Rogene Houston, MD;  [facility];  Service: Endoscopy;  Laterality: N/A;  1:20  . ESOPHAGOGASTRODUODENOSCOPY N/A 07/04/2013   Procedure: ESOPHAGOGASTRODUODENOSCOPY (EGD);  Surgeon: Rogene Houston, MD;  [facility];  Service: Endoscopy;  Laterality: N/A;  850  . ESOPHAGOGASTRODUODENOSCOPY N/A 08/06/2013   Procedure: ESOPHAGOGASTRODUODENOSCOPY (EGD);  Surgeon: Rogene Houston, MD;  [facility];  Service: Endoscopy;  Laterality: N/A;  1200  . ESOPHAGOGASTRODUODENOSCOPY N/A 10/31/2013   Procedure: ESOPHAGOGASTRODUODENOSCOPY (EGD);  Surgeon: Rogene Houston, MD;  [facility];  Service: Endoscopy;  Laterality: N/A;  125-moved to [place] notified pt  . ESOPHAGOGASTRODUODENOSCOPY N/A 10/23/2014   Procedure: ESOPHAGOGASTRODUODENOSCOPY (EGD);  Surgeon: Rogene Houston, MD;  [facility];  Service: Endoscopy;  Laterality: N/A;  1020  . ESOPHAGOGASTRODUODENOSCOPY N/A 10/11/2016   Procedure: ESOPHAGOGASTRODUODENOSCOPY (EGD);  Surgeon: Rogene Houston, MD;  [facility];  Service: Endoscopy;  Laterality: N/A;  245  . ESOPHAGOGASTRODUODENOSCOPY N/A 12/27/2016   Procedure: ESOPHAGOGASTRODUODENOSCOPY (EGD);  Surgeon: Rogene Houston, MD;  [facility];  Service: Endoscopy;  Laterality: N/A;  1225  . HEEL SPUR SURGERY Bilateral   . HOT HEMOSTASIS  11/01/2012   Procedure: HOT HEMOSTASIS (ARGON PLASMA COAGULATION/BICAP);  Surgeon: Rogene Houston, MD;  [facility];  Service: Endoscopy;  Laterality: N/A;  . HOT HEMOSTASIS N/A 07/04/2013   Procedure: HOT HEMOSTASIS (ARGON PLASMA COAGULATION/BICAP);  Surgeon: Rogene Houston, MD;  [facility];  Service: Endoscopy;  Laterality: N/A;  . HOT HEMOSTASIS N/A 08/06/2013   Procedure: HOT HEMOSTASIS (ARGON PLASMA COAGULATION/BICAP);  Surgeon: Rogene Houston, MD;  [facility];  Service: Endoscopy;  Laterality: N/A;  . HOT HEMOSTASIS N/A 10/31/2013   Procedure: HOT HEMOSTASIS (ARGON PLASMA COAGULATION/BICAP);  Surgeon: Rogene Houston, MD;  [facility];  Service: Endoscopy;  Laterality: N/A;  . HOT HEMOSTASIS N/A 10/23/2014   Procedure: HOT HEMOSTASIS (ARGON PLASMA COAGULATION/BICAP);  Surgeon: Rogene Houston, MD;  [facility];  Service: Endoscopy;  Laterality: N/A;  . HOT HEMOSTASIS N/A 10/11/2016   Procedure: HOT HEMOSTASIS (ARGON PLASMA COAGULATION/BICAP);  Surgeon: Rogene Houston, MD;  [facility];  Service: Endoscopy;  Laterality: N/A;  . JOINT REPLACEMENT    . KNEE ARTHROSCOPY Right    "before replacement"  . LUMBAR DISC SURGERY     "bone spur  . LUMBAR FUSION  06/25/2014   L4 & L5  . SHOULDER SURGERY Right    "spur"  . TONSILLECTOMY AND ADENOIDECTOMY    . TOTAL ABDOMINAL HYSTERECTOMY    . TOTAL KNEE ARTHROPLASTY Right   . US ECHOCARDIOGRAPHY  03/12/2006   EF 55-60%     SOCIAL HISTORY:  Social History   Socioeconomic History  . Marital status: Widowed    Spouse name: Not on file  . Number of children: Not on file  . Years of education: Not on file  . Highest education level: Not on file  Occupational History  . Not on file  Social Needs  . Financial resource strain: Not on file  . Food insecurity:    Worry: Not on file    Inability: Not on file  . Transportation needs:    Medical: Not on file    Non-medical: Not on file  Tobacco Use  . Smoking status: Never Smoker  . Smokeless tobacco: Never Used  Substance and Sexual Activity  . Alcohol use: No  . Drug use: No  . Sexual activity: Never  Lifestyle  . Physical activity:    Days per week: Not on file    Minutes per session: Not on file  .  Stress: Not on file  Relationships  . Social connections:    Talks on phone: Not on file    Gets together: Not on file    Attends religious service: Not on file    Active member of club or organization: Not on file    Attends meetings of clubs or organizations: Not on file    Relationship status: Not on file  . Intimate partner violence:    Fear of current or ex partner: Not on file    Emotionally abused: Not on file    Physically abused: Not on file    Forced sexual activity: Not on file  Other Topics Concern  . Not on file  Social History Narrative  . Not on file    FAMILY HISTORY:  Family History  Problem Relation Age of Onset  . Hypertension Mother   . Hypertension Father   . Heart attack Father   . Other Sister        aneurysm on heart  . Other Brother  aneurysm on heart  . Breast cancer Neg Hx     CURRENT MEDICATIONS:  Outpatient Encounter Medications as of 08/19/2018  Medication Sig  . albuterol (PROVENTIL) (2.5 MG/3ML) 0.083% nebulizer solution Take 2.5 mg by nebulization every 6 (six) hours as needed for wheezing or shortness of breath.  . cyanocobalamin 1000 MCG tablet Take 1,000 mcg by mouth daily.  . digoxin (DIGOX) 0.125 MG tablet Take 1 tablet mouth daily except none on Sundays or as directed  . feeding supplement, ENSURE ENLIVE, (ENSURE ENLIVE) LIQD Take 237 mLs by mouth 2 (two) times daily between meals.  . furosemide (LASIX) 20 MG tablet Take 1 tablet (20 mg total) by mouth daily.  . hydrALAZINE (APRESOLINE) 10 MG tablet Take 1 tablet (10 mg total) by mouth 2 (two) times daily.  Marland Kitchen levothyroxine (SYNTHROID, LEVOTHROID) 112 MCG tablet Take 112 mcg by mouth daily before breakfast.  . metoprolol tartrate (LOPRESSOR) 25 MG tablet TAKE 1 TABLET(25 MG) BY MOUTH TWICE DAILY  . pantoprazole (PROTONIX) 40 MG tablet TK 1 T PO D  . senna (SENOKOT) 8.6 MG tablet Take 1 tablet by mouth as needed for constipation.  . temazepam (RESTORIL) 15 MG capsule TK 1 C PO  QHS FOR SLP   Facility-Administered Encounter Medications as of 08/19/2018  Medication  . 0.9 %  sodium chloride infusion    ALLERGIES:  Allergies  Allergen Reactions  . Flagyl [Metronidazole] Diarrhea, Nausea Only and Other (See Comments)    Dizziness and feeling of inbalance  . Avapro [Irbesartan] Other (See Comments)    Cough   . Clarithromycin Other (See Comments)    Unknown   . Dexamethasone     UPSET STOMACH  . Losartan Potassium-Hctz Other (See Comments)    Unknown  . Maxzide [Hydrochlorothiazide W-Triamterene] Other (See Comments)    Unknown   . Ziac [Bisoprolol-Hydrochlorothiazide]     Thinks caused itching and cough 08/12/12  . Penicillins Swelling and Rash    Has patient had a PCN reaction causing immediate rash, facial/tongue/throat swelling, SOB or lightheadedness with hypotension: no Has patient had a PCN reaction causing severe rash involving mucus membranes or skin necrosis: no Has patient had a PCN reaction that required hospitalization {no Has patient had a PCN reaction occurring within the last 10 years: {no If all of the above answers are "NO", then may proceed with Cephalosporin use.  . Sulfa Drugs Cross Reactors Rash     PHYSICAL EXAM:  ECOG Performance status: 1  Vitals:   08/19/18 1416  BP: (!) 148/50  Pulse: 77  Resp: 20  Temp: 98.2 F (36.8 C)  SpO2: 93%   Filed Weights   08/19/18 1416  Weight: 111 lb 9.6 oz (50.6 kg)    Physical Exam  Constitutional: She is oriented to person, place, and time. She appears well-developed and well-nourished.  Musculoskeletal: Normal range of motion.  Neurological: She is alert and oriented to person, place, and time.  Skin: Skin is warm and dry.  Psychiatric: She has a normal mood and affect. Her behavior is normal. Judgment and thought content normal.     LABORATORY DATA:  I have reviewed the labs as listed.  CBC    Component Value Date/Time   WBC 11.8 (H) 08/13/2018 1137   RBC 4.24 08/13/2018  1137   HGB 13.7 08/13/2018 1137   HCT 42.4 08/13/2018 1137   PLT 304 08/13/2018 1137   MCV 100.0 08/13/2018 1137   MCH 32.3 08/13/2018 1137   MCHC 32.3 08/13/2018  1137   RDW 14.3 08/13/2018 1137   LYMPHSABS 0.6 (L) 08/13/2018 1137   MONOABS 1.7 (H) 08/13/2018 1137   EOSABS 0.1 08/13/2018 1137   BASOSABS 0.0 08/13/2018 1137   CMP Latest Ref Rng & Units 08/13/2018 05/15/2018 03/15/2018  Glucose 70 - 99 mg/dL 94 82 104(H)  BUN 8 - 23 mg/dL 27(H) 27(H) 31(H)  Creatinine 0.44 - 1.00 mg/dL 0.94 0.88 0.94  Sodium 135 - 145 mmol/L 134(L) 134(L) 135  Potassium 3.5 - 5.1 mmol/L 4.4 4.1 4.2  Chloride 98 - 111 mmol/L 91(L) 92(L) 94(L)  CO2 22 - 32 mmol/L 34(H) 35(H) 31  Calcium 8.9 - 10.3 mg/dL 9.1 9.0 9.3  Total Protein 6.5 - 8.1 g/dL 7.0 6.7 6.8  Total Bilirubin 0.3 - 1.2 mg/dL 0.6 0.5 0.6  Alkaline Phos 38 - 126 U/L 69 87 86  AST 15 - 41 U/L 23 26 24   ALT 0 - 44 U/L 18 21 19         ASSESSMENT & PLAN:   GAVE (gastric antral vascular ectasia) 1.  Iron deficiency state: - History of gastric antral vascular ectasia, last EGD on 12/27/2016 showing normal esophagus, moderate GAVE with bleeding in the antrum and prepyloric region, status post APC - She does not have any bleeding per rectum or melena. - Last Feraheme was on 05/20/2018. -We discussed the results of the blood work including ferritin which dropped to 22.  This was in the 55s.  Hemoglobin was normal at 13.7. -She is also feeling very tired.  I have recommended maintenance Feraheme infusions every 8 weeks.  We will schedule her for the first infusion this week and in 2 months.  I will see her back in 4 months for follow-up with repeat blood work.      Orders placed this encounter:  Orders Placed This Encounter  Procedures  . CBC with Differential/Platelet  . Comprehensive metabolic panel  . Ferritin  . Iron and TIBC  . Vitamin B12  . Folate      Derek Jack, MD [place] [phone]

## 2018-08-19 NOTE — Assessment & Plan Note (Signed)
1.  Iron deficiency state: - History of gastric antral vascular ectasia, last EGD on 12/27/2016 showing normal esophagus, moderate GAVE with bleeding in the antrum and prepyloric region, status post APC - She does not have any bleeding per rectum or melena. - Last Feraheme was on 05/20/2018. -We discussed the results of the blood work including ferritin which dropped to 22.  This was in the 56s.  Hemoglobin was normal at 13.7. -She is also feeling very tired.  I have recommended maintenance Feraheme infusions every 8 weeks.  We will schedule her for the first infusion this week and in 2 months.  I will see her back in 4 months for follow-up with repeat blood work.

## 2018-08-19 NOTE — Patient Instructions (Signed)
[facility] at [facility] Discharge Instructions     Thank you for choosing [facility] at [facility] to provide your oncology and hematology care.  To afford each patient quality time with our provider, please arrive at least 15 minutes before your scheduled appointment time.   If you have a lab appointment with the Cancer Center please come in thru the  Main Entrance and check in at the main information desk  You need to re-schedule your appointment should you arrive 10 or more minutes late.  We strive to give you quality time with our providers, and arriving late affects you and other patients whose appointments are after yours.  Also, if you no show three or more times for appointments you may be dismissed from the clinic at the providers discretion.     Again, thank you for choosing [facility].  Our hope is that these requests will decrease the amount of time that you wait before being seen by our physicians.       _____________________________________________________________  Should you have questions after your visit to [facility], please contact our office at [phone] between the hours of 8:00 a.m. and 4:30 p.m.  Voicemails left after 4:00 p.m. will not be returned until the following business day.  For prescription refill requests, have your pharmacy contact our office and allow 72 hours.    Cancer Center Support Programs:   > Cancer Support Group  2nd Tuesday of the month 1pm-2pm, Journey Room    

## 2018-08-20 NOTE — Addendum Note (Signed)
Addended by: Derek Jack on: 08/20/2018 04:37 PM   Modules accepted: Orders

## 2018-08-22 ENCOUNTER — Encounter (HOSPITAL_COMMUNITY): Payer: Self-pay

## 2018-08-22 ENCOUNTER — Inpatient Hospital Stay (HOSPITAL_COMMUNITY): Payer: Medicare Other

## 2018-08-22 VITALS — BP 150/54 | HR 67 | Temp 98.9°F | Resp 18

## 2018-08-22 DIAGNOSIS — Z79899 Other long term (current) drug therapy: Secondary | ICD-10-CM | POA: Diagnosis not present

## 2018-08-22 DIAGNOSIS — D500 Iron deficiency anemia secondary to blood loss (chronic): Secondary | ICD-10-CM

## 2018-08-22 DIAGNOSIS — K31811 Angiodysplasia of stomach and duodenum with bleeding: Secondary | ICD-10-CM | POA: Diagnosis not present

## 2018-08-22 DIAGNOSIS — D509 Iron deficiency anemia, unspecified: Secondary | ICD-10-CM | POA: Diagnosis not present

## 2018-08-22 DIAGNOSIS — Z23 Encounter for immunization: Secondary | ICD-10-CM | POA: Diagnosis not present

## 2018-08-22 DIAGNOSIS — Z0000 Encounter for general adult medical examination without abnormal findings: Secondary | ICD-10-CM

## 2018-08-22 MED ORDER — INFLUENZA VAC SPLIT HIGH-DOSE 0.5 ML IM SUSY
PREFILLED_SYRINGE | INTRAMUSCULAR | Status: AC
  Filled 2018-08-22: qty 0.5

## 2018-08-22 MED ORDER — SODIUM CHLORIDE 0.9 % IV SOLN
510.0000 mg | Freq: Once | INTRAVENOUS | Status: AC
  Administered 2018-08-22: 510 mg via INTRAVENOUS
  Filled 2018-08-22: qty 17

## 2018-08-22 MED ORDER — INFLUENZA VAC SPLIT HIGH-DOSE 0.5 ML IM SUSY
0.5000 mL | PREFILLED_SYRINGE | INTRAMUSCULAR | Status: AC
  Administered 2018-08-22: 0.5 mL via INTRAMUSCULAR

## 2018-08-22 MED ORDER — SODIUM CHLORIDE 0.9 % IV SOLN
Freq: Once | INTRAVENOUS | Status: AC
Start: 2018-08-22 — End: 2018-08-22
  Administered 2018-08-22: 12:00:00 via INTRAVENOUS

## 2018-08-22 NOTE — Progress Notes (Signed)
Lissie Hinesley Anastasi tolerated Feraheme infusion and Influenza vaccine well without complaints or incident. VSS upon discharge. Pt discharged via wheelchair in satisfactory condition accompanied by family member

## 2018-08-22 NOTE — Patient Instructions (Addendum)
[place] at [facility] Discharge Instructions  Received Feraheme infusion and Influenza vaccine today. Follow-up as scheduled. Call clinic for any questions or concerns   Thank you for choosing [place] at [facility] to provide your oncology and hematology care.  To afford each patient quality time with our provider, please arrive at least 15 minutes before your scheduled appointment time.   If you have a lab appointment with the [facility] please come in thru the  Main Entrance and check in at the main information desk  You need to re-schedule your appointment should you arrive 10 or more minutes late.  We strive to give you quality time with our providers, and arriving late affects you and other patients whose appointments are after yours.  Also, if you no show three or more times for appointments you may be dismissed from the clinic at the providers discretion.     Again, thank you for choosing [facility].  Our hope is that these requests will decrease the amount of time that you wait before being seen by our physicians.       _____________________________________________________________  Should you have questions after your visit to [facility], please contact our office at [phone] between the hours of 8:00 a.m. and 4:30 p.m.  Voicemails left after 4:00 p.m. will not be returned until the following business day.  For prescription refill requests, have your pharmacy contact our office and allow 72 hours.    Cancer Center Support Programs:   > Cancer Support Group  2nd Tuesday of the month 1pm-2pm, Journey Room

## 2018-08-22 NOTE — Progress Notes (Signed)
[name] presents today for injection per MD orders High dose flu vaccine administered IM in left deltoid. Administration without incident. Patient tolerated well.

## 2018-08-27 ENCOUNTER — Other Ambulatory Visit: Payer: Self-pay | Admitting: *Deleted

## 2018-08-27 DIAGNOSIS — F39 Unspecified mood [affective] disorder: Secondary | ICD-10-CM | POA: Diagnosis not present

## 2018-08-27 DIAGNOSIS — R0781 Pleurodynia: Secondary | ICD-10-CM | POA: Diagnosis not present

## 2018-08-27 DIAGNOSIS — Z6821 Body mass index (BMI) 21.0-21.9, adult: Secondary | ICD-10-CM | POA: Diagnosis not present

## 2018-08-27 DIAGNOSIS — D509 Iron deficiency anemia, unspecified: Secondary | ICD-10-CM | POA: Diagnosis not present

## 2018-08-27 DIAGNOSIS — Z8719 Personal history of other diseases of the digestive system: Secondary | ICD-10-CM | POA: Diagnosis not present

## 2018-08-27 DIAGNOSIS — G64 Other disorders of peripheral nervous system: Secondary | ICD-10-CM | POA: Diagnosis not present

## 2018-08-27 DIAGNOSIS — I270 Primary pulmonary hypertension: Secondary | ICD-10-CM | POA: Diagnosis not present

## 2018-08-27 DIAGNOSIS — I10 Essential (primary) hypertension: Secondary | ICD-10-CM | POA: Diagnosis not present

## 2018-08-27 DIAGNOSIS — J188 Other pneumonia, unspecified organism: Secondary | ICD-10-CM | POA: Diagnosis not present

## 2018-08-27 DIAGNOSIS — Z0000 Encounter for general adult medical examination without abnormal findings: Secondary | ICD-10-CM | POA: Diagnosis not present

## 2018-08-27 DIAGNOSIS — F419 Anxiety disorder, unspecified: Secondary | ICD-10-CM | POA: Diagnosis not present

## 2018-08-27 DIAGNOSIS — I499 Cardiac arrhythmia, unspecified: Secondary | ICD-10-CM | POA: Diagnosis not present

## 2018-08-27 DIAGNOSIS — E039 Hypothyroidism, unspecified: Secondary | ICD-10-CM | POA: Diagnosis not present

## 2018-08-27 DIAGNOSIS — K219 Gastro-esophageal reflux disease without esophagitis: Secondary | ICD-10-CM | POA: Diagnosis not present

## 2018-08-27 MED ORDER — HYDRALAZINE HCL 10 MG PO TABS: 10.0000 mg | ORAL_TABLET | Freq: Two times a day (BID) | ORAL | 1 refills | Status: DC

## 2018-08-30 DIAGNOSIS — F331 Major depressive disorder, recurrent, moderate: Secondary | ICD-10-CM | POA: Diagnosis not present

## 2018-08-30 DIAGNOSIS — E039 Hypothyroidism, unspecified: Secondary | ICD-10-CM | POA: Diagnosis not present

## 2018-08-30 DIAGNOSIS — I499 Cardiac arrhythmia, unspecified: Secondary | ICD-10-CM | POA: Diagnosis not present

## 2018-08-30 DIAGNOSIS — K31811 Angiodysplasia of stomach and duodenum with bleeding: Secondary | ICD-10-CM | POA: Diagnosis not present

## 2018-08-30 DIAGNOSIS — I10 Essential (primary) hypertension: Secondary | ICD-10-CM | POA: Diagnosis not present

## 2018-08-30 DIAGNOSIS — Z6822 Body mass index (BMI) 22.0-22.9, adult: Secondary | ICD-10-CM | POA: Diagnosis not present

## 2018-08-30 DIAGNOSIS — R002 Palpitations: Secondary | ICD-10-CM | POA: Diagnosis not present

## 2018-08-30 DIAGNOSIS — K5900 Constipation, unspecified: Secondary | ICD-10-CM | POA: Diagnosis not present

## 2018-08-30 DIAGNOSIS — I739 Peripheral vascular disease, unspecified: Secondary | ICD-10-CM | POA: Diagnosis not present

## 2018-08-30 DIAGNOSIS — I509 Heart failure, unspecified: Secondary | ICD-10-CM | POA: Diagnosis not present

## 2018-08-30 DIAGNOSIS — I272 Pulmonary hypertension, unspecified: Secondary | ICD-10-CM | POA: Diagnosis not present

## 2018-08-30 DIAGNOSIS — G4700 Insomnia, unspecified: Secondary | ICD-10-CM | POA: Diagnosis not present

## 2018-09-09 DIAGNOSIS — R2232 Localized swelling, mass and lump, left upper limb: Secondary | ICD-10-CM | POA: Diagnosis not present

## 2018-09-10 DIAGNOSIS — X32XXXA Exposure to sunlight, initial encounter: Secondary | ICD-10-CM | POA: Diagnosis not present

## 2018-09-10 DIAGNOSIS — L820 Inflamed seborrheic keratosis: Secondary | ICD-10-CM | POA: Diagnosis not present

## 2018-09-10 DIAGNOSIS — C44629 Squamous cell carcinoma of skin of left upper limb, including shoulder: Secondary | ICD-10-CM | POA: Diagnosis not present

## 2018-09-10 DIAGNOSIS — L570 Actinic keratosis: Secondary | ICD-10-CM | POA: Diagnosis not present

## 2018-09-17 DIAGNOSIS — L989 Disorder of the skin and subcutaneous tissue, unspecified: Secondary | ICD-10-CM | POA: Diagnosis not present

## 2018-09-17 DIAGNOSIS — F331 Major depressive disorder, recurrent, moderate: Secondary | ICD-10-CM | POA: Diagnosis not present

## 2018-09-17 DIAGNOSIS — G4700 Insomnia, unspecified: Secondary | ICD-10-CM | POA: Diagnosis not present

## 2018-09-19 ENCOUNTER — Inpatient Hospital Stay (HOSPITAL_COMMUNITY): Payer: Medicare Other | Attending: Hematology

## 2018-09-19 DIAGNOSIS — D500 Iron deficiency anemia secondary to blood loss (chronic): Secondary | ICD-10-CM | POA: Insufficient documentation

## 2018-09-19 DIAGNOSIS — K31811 Angiodysplasia of stomach and duodenum with bleeding: Secondary | ICD-10-CM | POA: Insufficient documentation

## 2018-09-19 LAB — FERRITIN: FERRITIN: 98 ng/mL (ref 11–307)

## 2018-09-19 LAB — CBC
HCT: 43.9 % (ref 36.0–46.0)
HEMOGLOBIN: 13.9 g/dL (ref 12.0–15.0)
MCH: 32.3 pg (ref 26.0–34.0)
MCHC: 31.7 g/dL (ref 30.0–36.0)
MCV: 101.9 fL — ABNORMAL HIGH (ref 80.0–100.0)
NRBC: 0 % (ref 0.0–0.2)
PLATELETS: 256 10*3/uL (ref 150–400)
RBC: 4.31 MIL/uL (ref 3.87–5.11)
RDW: 14.2 % (ref 11.5–15.5)
WBC: 7.3 10*3/uL (ref 4.0–10.5)

## 2018-09-27 ENCOUNTER — Other Ambulatory Visit: Payer: Self-pay

## 2018-10-21 DIAGNOSIS — L570 Actinic keratosis: Secondary | ICD-10-CM | POA: Diagnosis not present

## 2018-10-21 DIAGNOSIS — D485 Neoplasm of uncertain behavior of skin: Secondary | ICD-10-CM | POA: Diagnosis not present

## 2018-10-21 DIAGNOSIS — X32XXXD Exposure to sunlight, subsequent encounter: Secondary | ICD-10-CM | POA: Diagnosis not present

## 2018-10-21 DIAGNOSIS — D0462 Carcinoma in situ of skin of left upper limb, including shoulder: Secondary | ICD-10-CM | POA: Diagnosis not present

## 2018-10-21 DIAGNOSIS — C44629 Squamous cell carcinoma of skin of left upper limb, including shoulder: Secondary | ICD-10-CM | POA: Diagnosis not present

## 2018-10-22 ENCOUNTER — Encounter (HOSPITAL_COMMUNITY): Payer: Self-pay

## 2018-10-22 ENCOUNTER — Inpatient Hospital Stay (HOSPITAL_COMMUNITY): Payer: Medicare Other | Attending: Nurse Practitioner

## 2018-10-22 ENCOUNTER — Other Ambulatory Visit: Payer: Self-pay

## 2018-10-22 VITALS — BP 163/60 | HR 69 | Temp 98.4°F | Resp 18

## 2018-10-22 DIAGNOSIS — D500 Iron deficiency anemia secondary to blood loss (chronic): Secondary | ICD-10-CM | POA: Insufficient documentation

## 2018-10-22 MED ORDER — SODIUM CHLORIDE 0.9 % IV SOLN
510.0000 mg | Freq: Once | INTRAVENOUS | Status: AC
  Administered 2018-10-22: 510 mg via INTRAVENOUS
  Filled 2018-10-22: qty 17

## 2018-10-22 MED ORDER — SODIUM CHLORIDE 0.9 % IV SOLN
Freq: Once | INTRAVENOUS | Status: AC
  Administered 2018-10-22: 14:00:00 via INTRAVENOUS

## 2018-10-22 NOTE — Patient Instructions (Signed)
[facility] at [facility]  Discharge Instructions:   _______________________________________________________________  Thank you for choosing [facility] at [facility] to provide your oncology and hematology care.  To afford each patient quality time with our providers, please arrive at least 15 minutes before your scheduled appointment.  You need to re-schedule your appointment if you arrive 10 or more minutes late.  We strive to give you quality time with our providers, and arriving late affects you and other patients whose appointments are after yours.  Also, if you no show three or more times for appointments you may be dismissed from the clinic.  Again, thank you for choosing  Can[facility] Center at [facility]. Our hope is that these requests will allow you access to exceptional care and in a timely manner. _______________________________________________________________  If you have questions after your visit, please contact our office at [phone] between the hours of 8:30 a.m. and 5:00 p.m. Voicemails left after 4:30 p.m. will not be returned until the following business day. _______________________________________________________________  For prescription refill requests, have your pharmacy contact our office. _______________________________________________________________  Recommendations made by the consultant and any test results will be sent to your referring physician. _______________________________________________________________ 

## 2018-10-22 NOTE — Progress Notes (Signed)
Feraheme given today per MD orders. Tolerated infusion without adverse affects. Vital signs stable. No complaints at this time. Discharged from clinic ambulatory. F/U with [facility] as scheduled.  

## 2018-10-23 ENCOUNTER — Other Ambulatory Visit: Payer: Self-pay

## 2018-10-23 MED ORDER — METOPROLOL TARTRATE 25 MG PO TABS: ORAL_TABLET | ORAL | 1 refills | Status: DC

## 2018-11-12 ENCOUNTER — Other Ambulatory Visit: Payer: Self-pay

## 2018-11-12 MED ORDER — FUROSEMIDE 20 MG PO TABS: 20.0000 mg | ORAL_TABLET | Freq: Every day | ORAL | 0 refills | Status: DC

## 2018-12-02 DIAGNOSIS — L570 Actinic keratosis: Secondary | ICD-10-CM | POA: Diagnosis not present

## 2018-12-02 DIAGNOSIS — Z85828 Personal history of other malignant neoplasm of skin: Secondary | ICD-10-CM | POA: Diagnosis not present

## 2018-12-02 DIAGNOSIS — X32XXXD Exposure to sunlight, subsequent encounter: Secondary | ICD-10-CM | POA: Diagnosis not present

## 2018-12-02 DIAGNOSIS — Z08 Encounter for follow-up examination after completed treatment for malignant neoplasm: Secondary | ICD-10-CM | POA: Diagnosis not present

## 2018-12-11 ENCOUNTER — Inpatient Hospital Stay (HOSPITAL_COMMUNITY): Payer: Medicare Other | Attending: Hematology

## 2018-12-11 ENCOUNTER — Other Ambulatory Visit (HOSPITAL_COMMUNITY): Payer: Medicare Other

## 2018-12-11 DIAGNOSIS — D500 Iron deficiency anemia secondary to blood loss (chronic): Secondary | ICD-10-CM | POA: Diagnosis not present

## 2018-12-11 LAB — COMPREHENSIVE METABOLIC PANEL
ALBUMIN: 3.3 g/dL — AB (ref 3.5–5.0)
ALK PHOS: 66 U/L (ref 38–126)
ALT: 26 U/L (ref 0–44)
AST: 30 U/L (ref 15–41)
Anion gap: 8 (ref 5–15)
BILIRUBIN TOTAL: 0.5 mg/dL (ref 0.3–1.2)
BUN: 35 mg/dL — AB (ref 8–23)
CO2: 35 mmol/L — ABNORMAL HIGH (ref 22–32)
Calcium: 9.3 mg/dL (ref 8.9–10.3)
Chloride: 93 mmol/L — ABNORMAL LOW (ref 98–111)
Creatinine, Ser: 1.03 mg/dL — ABNORMAL HIGH (ref 0.44–1.00)
GFR calc Af Amer: 58 mL/min — ABNORMAL LOW (ref >60)
GFR calc non Af Amer: 50 mL/min — ABNORMAL LOW (ref >60)
Glucose, Bld: 94 mg/dL (ref 70–99)
POTASSIUM: 4.3 mmol/L (ref 3.5–5.1)
SODIUM: 136 mmol/L (ref 135–145)
TOTAL PROTEIN: 6.8 g/dL (ref 6.5–8.1)

## 2018-12-11 LAB — CBC WITH DIFFERENTIAL/PLATELET
ABS IMMATURE GRANULOCYTES: 0.03 10*3/uL (ref 0.00–0.07)
BASOS ABS: 0.1 10*3/uL (ref 0.0–0.1)
Basophils Relative: 1 %
Eosinophils Absolute: 0.2 10*3/uL (ref 0.0–0.5)
Eosinophils Relative: 3 %
HEMATOCRIT: 44.4 % (ref 36.0–46.0)
HEMOGLOBIN: 13.7 g/dL (ref 12.0–15.0)
Immature Granulocytes: 0 %
LYMPHS ABS: 0.8 10*3/uL (ref 0.7–4.0)
LYMPHS PCT: 10 %
MCH: 32.4 pg (ref 26.0–34.0)
MCHC: 30.9 g/dL (ref 30.0–36.0)
MCV: 105 fL — ABNORMAL HIGH (ref 80.0–100.0)
Monocytes Absolute: 1 10*3/uL (ref 0.1–1.0)
Monocytes Relative: 14 %
NEUTROS ABS: 5.2 10*3/uL (ref 1.7–7.7)
NRBC: 0 % (ref 0.0–0.2)
Neutrophils Relative %: 72 %
Platelets: 251 10*3/uL (ref 150–400)
RBC: 4.23 MIL/uL (ref 3.87–5.11)
RDW: 13.8 % (ref 11.5–15.5)
WBC: 7.2 10*3/uL (ref 4.0–10.5)

## 2018-12-11 LAB — IRON AND TIBC
Iron: 98 ug/dL (ref 28–170)
Saturation Ratios: 32 % — ABNORMAL HIGH (ref 10.4–31.8)
TIBC: 304 ug/dL (ref 250–450)
UIBC: 206 ug/dL

## 2018-12-11 LAB — VITAMIN B12: Vitamin B-12: 1463 pg/mL — ABNORMAL HIGH (ref 180–914)

## 2018-12-11 LAB — FERRITIN: Ferritin: 99 ng/mL (ref 11–307)

## 2018-12-11 LAB — FOLATE: Folate: 25.1 ng/mL (ref >5.9)

## 2018-12-17 DIAGNOSIS — M4716 Other spondylosis with myelopathy, lumbar region: Secondary | ICD-10-CM | POA: Diagnosis not present

## 2018-12-18 ENCOUNTER — Inpatient Hospital Stay (HOSPITAL_COMMUNITY): Payer: Medicare Other | Attending: Nurse Practitioner | Admitting: Hematology

## 2018-12-18 ENCOUNTER — Encounter (HOSPITAL_COMMUNITY): Payer: Self-pay | Admitting: Hematology

## 2018-12-18 ENCOUNTER — Ambulatory Visit (HOSPITAL_COMMUNITY): Payer: Medicare Other | Admitting: Hematology

## 2018-12-18 VITALS — BP 131/52 | HR 70 | Temp 98.1°F | Resp 16 | Wt 112.3 lb

## 2018-12-18 DIAGNOSIS — K31819 Angiodysplasia of stomach and duodenum without bleeding: Secondary | ICD-10-CM

## 2018-12-18 DIAGNOSIS — D500 Iron deficiency anemia secondary to blood loss (chronic): Secondary | ICD-10-CM | POA: Diagnosis not present

## 2018-12-18 NOTE — Assessment & Plan Note (Signed)
1.  Iron deficiency anemia: - History of gastric antral vascular ectasia, last EGD on 12/27/2016 showing normal esophagus, moderate GAVE with bleeding in the antrum and prepyloric region, status post APC - Denies any bleeding per rectum or melena. - At last visit we have changed her Feraheme to every 8 weeks maintenance.  Last infusion was on 10/22/2018. -We reviewed her blood work today.  Hemoglobin is 13.7 and ferritin is 99. -I have recommended spreading out her Feraheme maintenance to every 12 weeks.  We will see her back in 6 months for follow-up.

## 2018-12-18 NOTE — Progress Notes (Signed)
[name], [place] [address]   CLINIC:  Medical Oncology/Hematology  PCP:  Celene Squibb, MD [place] [address] [phone]   REASON FOR VISIT: Follow-up for iron deficiency anemia  CURRENT THERAPY: intermittent feraheme infusions   INTERVAL HISTORY:  [name] 85 y.o. female returns for routine follow-up for iron deficiency anemia. She is here today with her friend. She reports she has been doing well since her last visit. She needs help with most of her daily activities. She is not very active during the day. She mostly sit and crochets. She wear oxygen at night time only. Denies any nausea, vomiting, or diarrhea. Denies any new pains. Had not noticed any recent bleeding such as epistaxis, hematuria or hematochezia. Denies recent chest pain on exertion, shortness of breath on minimal exertion, pre-syncopal episodes, or palpitations. Denies any numbness or tingling in hands or feet. Denies any recent fevers, infections, or recent hospitalizations. Patient reports appetite at 50% and energy level at 50%.   REVIEW OF SYSTEMS:  Review of Systems  Constitutional: Positive for fatigue.  All other systems reviewed and are negative.    PAST MEDICAL/SURGICAL HISTORY:  Past Medical History:  Diagnosis Date  . Anemia   . Chronic bronchitis ([place])   . GAVE (gastric antral vascular ectasia) 09/12/12  . Hypertension   . Hypothyroidism   . Iron deficiency anemia 10/15/2012  . Iron deficiency anemia due to chronic blood loss 04/20/2014  . On home oxygen therapy    "2L/[place] at night" (11/07/2016)  . Palpitations   . Pneumonia 06/2016; 10/2016  . Syncope and collapse 11/06/2016   Past Surgical History:  Procedure Laterality Date  . BACK SURGERY    . BIOPSY  12/27/2016   Procedure: BIOPSY;  Surgeon: Rogene Houston, MD;  [facility];  Service: Endoscopy;;  gastric  . COLONOSCOPY  07/24/2012   Procedure: COLONOSCOPY;   Surgeon: Rogene Houston, MD;  [facility];  Service: Endoscopy;  Laterality: N/A;  200  . DILATION AND CURETTAGE OF UTERUS    . ESOPHAGOGASTRODUODENOSCOPY  09/12/2012   Procedure: ESOPHAGOGASTRODUODENOSCOPY (EGD);  Surgeon: Rogene Houston, MD;  [facility];  Service: Endoscopy;  Laterality: N/A;  325-changed to 200 per Ann-pt moved up to [place] to notify pt  . ESOPHAGOGASTRODUODENOSCOPY  11/01/2012   Procedure: ESOPHAGOGASTRODUODENOSCOPY (EGD);  Surgeon: Rogene Houston, MD;  [facility];  Service: Endoscopy;  Laterality: N/A;  1:20  . ESOPHAGOGASTRODUODENOSCOPY N/A 07/04/2013   Procedure: ESOPHAGOGASTRODUODENOSCOPY (EGD);  Surgeon: Rogene Houston, MD;  [facility];  Service: Endoscopy;  Laterality: N/A;  850  . ESOPHAGOGASTRODUODENOSCOPY N/A 08/06/2013   Procedure: ESOPHAGOGASTRODUODENOSCOPY (EGD);  Surgeon: Rogene Houston, MD;  [facility];  Service: Endoscopy;  Laterality: N/A;  1200  . ESOPHAGOGASTRODUODENOSCOPY N/A 10/31/2013   Procedure: ESOPHAGOGASTRODUODENOSCOPY (EGD);  Surgeon: Rogene Houston, MD;  [facility];  Service: Endoscopy;  Laterality: N/A;  125-moved to [place] notified pt  . ESOPHAGOGASTRODUODENOSCOPY N/A 10/23/2014   Procedure: ESOPHAGOGASTRODUODENOSCOPY (EGD);  Surgeon: Rogene Houston, MD;  [facility];  Service: Endoscopy;  Laterality: N/A;  1020  . ESOPHAGOGASTRODUODENOSCOPY N/A 10/11/2016   Procedure: ESOPHAGOGASTRODUODENOSCOPY (EGD);  Surgeon: Rogene Houston, MD;  [facility];  Service: Endoscopy;  Laterality: N/A;  245  . ESOPHAGOGASTRODUODENOSCOPY N/A 12/27/2016   Procedure: ESOPHAGOGASTRODUODENOSCOPY (EGD);  Surgeon: Rogene Houston, MD;  [facility];  Service: Endoscopy;  Laterality: N/A;  1225  . HEEL SPUR SURGERY Bilateral   . HOT HEMOSTASIS  11/01/2012   Procedure: HOT HEMOSTASIS (ARGON PLASMA COAGULATION/BICAP);  Surgeon: Rogene Houston, MD;   [facility];  Service: Endoscopy;  Laterality: N/A;  . HOT HEMOSTASIS N/A 07/04/2013   Procedure: HOT HEMOSTASIS (ARGON PLASMA COAGULATION/BICAP);  Surgeon: Rogene Houston, MD;  [facility];  Service: Endoscopy;  Laterality: N/A;  . HOT HEMOSTASIS N/A 08/06/2013   Procedure: HOT HEMOSTASIS (ARGON PLASMA COAGULATION/BICAP);  Surgeon: Rogene Houston, MD;  [facility];  Service: Endoscopy;  Laterality: N/A;  . HOT HEMOSTASIS N/A 10/31/2013   Procedure: HOT HEMOSTASIS (ARGON PLASMA COAGULATION/BICAP);  Surgeon: Rogene Houston, MD;  [facility];  Service: Endoscopy;  Laterality: N/A;  . HOT HEMOSTASIS N/A 10/23/2014   Procedure: HOT HEMOSTASIS (ARGON PLASMA COAGULATION/BICAP);  Surgeon: Rogene Houston, MD;  [facility];  Service: Endoscopy;  Laterality: N/A;  . HOT HEMOSTASIS N/A 10/11/2016   Procedure: HOT HEMOSTASIS (ARGON PLASMA COAGULATION/BICAP);  Surgeon: Rogene Houston, MD;  [facility];  Service: Endoscopy;  Laterality: N/A;  . JOINT REPLACEMENT    . KNEE ARTHROSCOPY Right    "before replacement"  . LUMBAR DISC SURGERY     "bone spur  . LUMBAR FUSION  06/25/2014   L4 & L5  . SHOULDER SURGERY Right    "spur"  . TONSILLECTOMY AND ADENOIDECTOMY    . TOTAL ABDOMINAL HYSTERECTOMY    . TOTAL KNEE ARTHROPLASTY Right   . US ECHOCARDIOGRAPHY  03/12/2006   EF 55-60%     SOCIAL HISTORY:  Social History   Socioeconomic History  . Marital status: Widowed    Spouse name: Not on file  . Number of children: Not on file  . Years of education: Not on file  . Highest education level: Not on file  Occupational History  . Not on file  Social Needs  . Financial resource strain: Not on file  . Food insecurity:    Worry: Not on file    Inability: Not on file  . Transportation needs:    Medical: Not on file    Non-medical: Not on file  Tobacco Use  . Smoking status: Never Smoker  . Smokeless tobacco: Never Used    Substance and Sexual Activity  . Alcohol use: No  . Drug use: No  . Sexual activity: Never  Lifestyle  . Physical activity:    Days per week: Not on file    Minutes per session: Not on file  . Stress: Not on file  Relationships  . Social connections:    Talks on phone: Not on file    Gets together: Not on file    Attends religious service: Not on file    Active member of club or organization: Not on file    Attends meetings of clubs or organizations: Not on file    Relationship status: Not on file  . Intimate partner violence:    Fear of current or ex partner: Not on file    Emotionally abused: Not on file    Physically abused: Not on file    Forced sexual activity: Not on file  Other Topics Concern  . Not on file  Social History Narrative  . Not on file    FAMILY HISTORY:  Family History  Problem Relation Age of Onset  . Hypertension Mother   .  Hypertension Father   . Heart attack Father   . Other Sister        aneurysm on heart  . Other Brother        aneurysm on heart  . Breast cancer Neg Hx     CURRENT MEDICATIONS:  Outpatient Encounter Medications as of 12/18/2018  Medication Sig  . albuterol (PROVENTIL) (2.5 MG/3ML) 0.083% nebulizer solution Take 2.5 mg by nebulization every 6 (six) hours as needed for wheezing or shortness of breath.  . cyanocobalamin 1000 MCG tablet Take 1,000 mcg by mouth daily.  . digoxin (DIGOX) 0.125 MG tablet Take 1 tablet mouth daily except none on Sundays or as directed  . feeding supplement, ENSURE ENLIVE, (ENSURE ENLIVE) LIQD Take 237 mLs by mouth 2 (two) times daily between meals.  . furosemide (LASIX) 20 MG tablet Take 1 tablet (20 mg total) by mouth daily. SCHEDULE OV FOR FURTHER REFILLS. 1ST ATP  . hydrALAZINE (APRESOLINE) 10 MG tablet Take 1 tablet (10 mg total) by mouth 2 (two) times daily.  Marland Kitchen levothyroxine (SYNTHROID, LEVOTHROID) 112 MCG tablet Take 112 mcg by mouth daily before breakfast.  . linaclotide (LINZESS) 72 MCG  capsule Take 72 mcg by mouth daily before breakfast.  . metoprolol tartrate (LOPRESSOR) 25 MG tablet TAKE 1 TABLET(25 MG) BY MOUTH TWICE DAILY, please call time for follow up appointment  . mirtazapine (REMERON) 15 MG tablet Take 15 mg by mouth at bedtime.  . pantoprazole (PROTONIX) 40 MG tablet TK 1 T PO D  . [DISCONTINUED] senna (SENOKOT) 8.6 MG tablet Take 1 tablet by mouth as needed for constipation.  . [DISCONTINUED] temazepam (RESTORIL) 15 MG capsule TK 1 C PO QHS FOR SLP   Facility-Administered Encounter Medications as of 12/18/2018  Medication  . 0.9 %  sodium chloride infusion    ALLERGIES:  Allergies  Allergen Reactions  . Flagyl [Metronidazole] Diarrhea, Nausea Only and Other (See Comments)    Dizziness and feeling of inbalance  . Avapro [Irbesartan] Other (See Comments)    Cough   . Clarithromycin Other (See Comments)    Unknown   . Dexamethasone     UPSET STOMACH  . Losartan Potassium-Hctz Other (See Comments)    Unknown  . Maxzide [Hydrochlorothiazide W-Triamterene] Other (See Comments)    Unknown   . Ziac [Bisoprolol-Hydrochlorothiazide]     Thinks caused itching and cough 08/12/12  . Penicillins Swelling and Rash    Has patient had a PCN reaction causing immediate rash, facial/tongue/throat swelling, SOB or lightheadedness with hypotension: no Has patient had a PCN reaction causing severe rash involving mucus membranes or skin necrosis: no Has patient had a PCN reaction that required hospitalization {no Has patient had a PCN reaction occurring within the last 10 years: {no If all of the above answers are "NO", then may proceed with Cephalosporin use.  . Sulfa Drugs Cross Reactors Rash     PHYSICAL EXAM:  ECOG Performance status: 1  Vitals:   12/18/18 0841  BP: (!) 131/52  Pulse: 70  Resp: 16  Temp: 98.1 F (36.7 C)  SpO2: 90%   Filed Weights   12/18/18 0841  Weight: 112 lb 4.8 oz (50.9 kg)    Physical Exam Constitutional:      Appearance: Normal  appearance. She is normal weight.  Cardiovascular:     Rate and Rhythm: Normal rate and regular rhythm.     Heart sounds: Normal heart sounds.  Pulmonary:     Effort: Pulmonary effort is normal.  Breath sounds: Normal breath sounds.  Musculoskeletal: Normal range of motion.  Skin:    General: Skin is warm and dry.  Neurological:     Mental Status: She is alert and oriented to person, place, and time. Mental status is at baseline.  Psychiatric:        Mood and Affect: Mood normal.        Behavior: Behavior normal.        Thought Content: Thought content normal.        Judgment: Judgment normal.      LABORATORY DATA:  I have reviewed the labs as listed.  CBC    Component Value Date/Time   WBC 7.2 12/11/2018 1053   RBC 4.23 12/11/2018 1053   HGB 13.7 12/11/2018 1053   HCT 44.4 12/11/2018 1053   PLT 251 12/11/2018 1053   MCV 105.0 (H) 12/11/2018 1053   MCH 32.4 12/11/2018 1053   MCHC 30.9 12/11/2018 1053   RDW 13.8 12/11/2018 1053   LYMPHSABS 0.8 12/11/2018 1053   MONOABS 1.0 12/11/2018 1053   EOSABS 0.2 12/11/2018 1053   BASOSABS 0.1 12/11/2018 1053   CMP Latest Ref Rng & Units 12/11/2018 08/13/2018 05/15/2018  Glucose 70 - 99 mg/dL 94 94 82  BUN 8 - 23 mg/dL 35(H) 27(H) 27(H)  Creatinine 0.44 - 1.00 mg/dL 1.03(H) 0.94 0.88  Sodium 135 - 145 mmol/L 136 134(L) 134(L)  Potassium 3.5 - 5.1 mmol/L 4.3 4.4 4.1  Chloride 98 - 111 mmol/L 93(L) 91(L) 92(L)  CO2 22 - 32 mmol/L 35(H) 34(H) 35(H)  Calcium 8.9 - 10.3 mg/dL 9.3 9.1 9.0  Total Protein 6.5 - 8.1 g/dL 6.8 7.0 6.7  Total Bilirubin 0.3 - 1.2 mg/dL 0.5 0.6 0.5  Alkaline Phos 38 - 126 U/L 66 69 87  AST 15 - 41 U/L 30 23 26   ALT 0 - 44 U/L 26 18 21        DIAGNOSTIC IMAGING:  I have independently reviewed the scans and discussed with the patient.   I have reviewed Francene Finders, NP's note and agree with the documentation.  I personally performed a face-to-face visit, made revisions and my assessment and plan is  as follows.    ASSESSMENT & PLAN:   GAVE (gastric antral vascular ectasia) 1.  Iron deficiency anemia: - History of gastric antral vascular ectasia, last EGD on 12/27/2016 showing normal esophagus, moderate GAVE with bleeding in the antrum and prepyloric region, status post APC - Denies any bleeding per rectum or melena. - At last visit we have changed her Feraheme to every 8 weeks maintenance.  Last infusion was on 10/22/2018. -We reviewed her blood work today.  Hemoglobin is 13.7 and ferritin is 99. -I have recommended spreading out her Feraheme maintenance to every 12 weeks.  We will see her back in 6 months for follow-up.      Orders placed this encounter:  Orders Placed This Encounter  Procedures  . CBC with Differential/Platelet  . Comprehensive metabolic panel  . Ferritin  . Iron and TIBC  . Vitamin B12  . Folate      Derek Jack, MD [place] [phone]

## 2018-12-18 NOTE — Patient Instructions (Signed)
[facility] at [facility] Discharge Instructions     Thank you for choosing [facility] at [facility] to provide your oncology and hematology care.  To afford each patient quality time with our provider, please arrive at least 15 minutes before your scheduled appointment time.   If you have a lab appointment with the Cancer Center please come in thru the  Main Entrance and check in at the main information desk  You need to re-schedule your appointment should you arrive 10 or more minutes late.  We strive to give you quality time with our providers, and arriving late affects you and other patients whose appointments are after yours.  Also, if you no show three or more times for appointments you may be dismissed from the clinic at the providers discretion.     Again, thank you for choosing [facility].  Our hope is that these requests will decrease the amount of time that you wait before being seen by our physicians.       _____________________________________________________________  Should you have questions after your visit to [facility], please contact our office at [phone] between the hours of 8:00 a.m. and 4:30 p.m.  Voicemails left after 4:00 p.m. will not be returned until the following business day.  For prescription refill requests, have your pharmacy contact our office and allow 72 hours.    Cancer Center Support Programs:   > Cancer Support Group  2nd Tuesday of the month 1pm-2pm, Journey Room    

## 2019-01-28 DIAGNOSIS — I10 Essential (primary) hypertension: Secondary | ICD-10-CM | POA: Diagnosis not present

## 2019-01-28 DIAGNOSIS — D509 Iron deficiency anemia, unspecified: Secondary | ICD-10-CM | POA: Diagnosis not present

## 2019-01-28 DIAGNOSIS — E039 Hypothyroidism, unspecified: Secondary | ICD-10-CM | POA: Diagnosis not present

## 2019-01-31 DIAGNOSIS — I10 Essential (primary) hypertension: Secondary | ICD-10-CM | POA: Diagnosis not present

## 2019-01-31 DIAGNOSIS — K31819 Angiodysplasia of stomach and duodenum without bleeding: Secondary | ICD-10-CM | POA: Diagnosis not present

## 2019-01-31 DIAGNOSIS — I272 Pulmonary hypertension, unspecified: Secondary | ICD-10-CM | POA: Diagnosis not present

## 2019-01-31 DIAGNOSIS — F331 Major depressive disorder, recurrent, moderate: Secondary | ICD-10-CM | POA: Diagnosis not present

## 2019-01-31 DIAGNOSIS — G4700 Insomnia, unspecified: Secondary | ICD-10-CM | POA: Diagnosis not present

## 2019-01-31 DIAGNOSIS — N183 Chronic kidney disease, stage 3 (moderate): Secondary | ICD-10-CM | POA: Diagnosis not present

## 2019-01-31 DIAGNOSIS — I491 Atrial premature depolarization: Secondary | ICD-10-CM | POA: Diagnosis not present

## 2019-01-31 DIAGNOSIS — K5900 Constipation, unspecified: Secondary | ICD-10-CM | POA: Diagnosis not present

## 2019-01-31 DIAGNOSIS — D509 Iron deficiency anemia, unspecified: Secondary | ICD-10-CM | POA: Diagnosis not present

## 2019-01-31 DIAGNOSIS — E039 Hypothyroidism, unspecified: Secondary | ICD-10-CM | POA: Diagnosis not present

## 2019-01-31 DIAGNOSIS — I5032 Chronic diastolic (congestive) heart failure: Secondary | ICD-10-CM | POA: Diagnosis not present

## 2019-01-31 DIAGNOSIS — I739 Peripheral vascular disease, unspecified: Secondary | ICD-10-CM | POA: Diagnosis not present

## 2019-02-10 ENCOUNTER — Other Ambulatory Visit: Payer: Self-pay

## 2019-02-10 MED ORDER — METOPROLOL TARTRATE 25 MG PO TABS: ORAL_TABLET | ORAL | 3 refills | Status: DC

## 2019-02-25 ENCOUNTER — Other Ambulatory Visit: Payer: Self-pay

## 2019-02-25 MED ORDER — HYDRALAZINE HCL 10 MG PO TABS: 10.0000 mg | ORAL_TABLET | Freq: Two times a day (BID) | ORAL | 1 refills | Status: AC

## 2019-03-14 DIAGNOSIS — I270 Primary pulmonary hypertension: Secondary | ICD-10-CM | POA: Diagnosis not present

## 2019-03-14 DIAGNOSIS — D509 Iron deficiency anemia, unspecified: Secondary | ICD-10-CM | POA: Diagnosis not present

## 2019-03-14 DIAGNOSIS — J188 Other pneumonia, unspecified organism: Secondary | ICD-10-CM | POA: Diagnosis not present

## 2019-03-14 DIAGNOSIS — E039 Hypothyroidism, unspecified: Secondary | ICD-10-CM | POA: Diagnosis not present

## 2019-03-14 DIAGNOSIS — F39 Unspecified mood [affective] disorder: Secondary | ICD-10-CM | POA: Diagnosis not present

## 2019-03-14 DIAGNOSIS — F419 Anxiety disorder, unspecified: Secondary | ICD-10-CM | POA: Diagnosis not present

## 2019-03-14 DIAGNOSIS — I10 Essential (primary) hypertension: Secondary | ICD-10-CM | POA: Diagnosis not present

## 2019-03-14 DIAGNOSIS — Z8719 Personal history of other diseases of the digestive system: Secondary | ICD-10-CM | POA: Diagnosis not present

## 2019-03-14 DIAGNOSIS — R0781 Pleurodynia: Secondary | ICD-10-CM | POA: Diagnosis not present

## 2019-03-14 DIAGNOSIS — G64 Other disorders of peripheral nervous system: Secondary | ICD-10-CM | POA: Diagnosis not present

## 2019-03-14 DIAGNOSIS — K219 Gastro-esophageal reflux disease without esophagitis: Secondary | ICD-10-CM | POA: Diagnosis not present

## 2019-03-14 DIAGNOSIS — I499 Cardiac arrhythmia, unspecified: Secondary | ICD-10-CM | POA: Diagnosis not present

## 2019-03-19 DIAGNOSIS — E039 Hypothyroidism, unspecified: Secondary | ICD-10-CM | POA: Diagnosis not present

## 2019-03-19 DIAGNOSIS — F331 Major depressive disorder, recurrent, moderate: Secondary | ICD-10-CM | POA: Diagnosis not present

## 2019-03-19 DIAGNOSIS — N183 Chronic kidney disease, stage 3 (moderate): Secondary | ICD-10-CM | POA: Diagnosis not present

## 2019-03-19 DIAGNOSIS — J9620 Acute and chronic respiratory failure, unspecified whether with hypoxia or hypercapnia: Secondary | ICD-10-CM | POA: Diagnosis not present

## 2019-03-19 DIAGNOSIS — I272 Pulmonary hypertension, unspecified: Secondary | ICD-10-CM | POA: Diagnosis not present

## 2019-03-24 ENCOUNTER — Other Ambulatory Visit: Payer: Self-pay

## 2019-03-24 ENCOUNTER — Telehealth: Payer: Self-pay | Admitting: Cardiology

## 2019-03-24 MED ORDER — FUROSEMIDE 20 MG PO TABS: 20.0000 mg | ORAL_TABLET | Freq: Every day | ORAL | 0 refills | Status: AC

## 2019-03-24 NOTE — Telephone Encounter (Signed)
Follow up    Please call Izora Gala at [phone].

## 2019-03-24 NOTE — Telephone Encounter (Signed)
Spoke to patient's daughter [name] she stated she wanted to know if mother had a appointment with Dr.Jordan.Advised she is past due.Virtual appointment scheduled with Dr.Jordan 05/13/19 at 1:40 pm.Advised to call sooner if needed.

## 2019-03-24 NOTE — Telephone Encounter (Signed)
New message   Per Izora Gala at [facility] office wants to know if Digoxin Level can be done at your office or Dr. Juel Burrow office. Please call to discuss.

## 2019-04-17 ENCOUNTER — Telehealth: Payer: Self-pay | Admitting: Cardiology

## 2019-04-17 NOTE — Telephone Encounter (Signed)
  Dr Juel Burrow office is calling and requesting that we add [facility] as [facility] preferred pharmacy and send all new scripts over to [facility]

## 2019-04-17 NOTE — Telephone Encounter (Signed)
Added as requested.

## 2019-05-11 NOTE — Progress Notes (Signed)
Cardiology Office Note    Date:  05/13/2019   ID:  [name], [name] 21-Dec-1932, MRN [id number]  PCP:  Celene Squibb, MD  Cardiologist:  Dr. Martinique   Chief Complaint  Patient presents with  . Edema    History of Present Illness:  [name] is a 85 y.o. female with PMH of HTN, PACs, and syncope.  Myoview performed in February 2013 was negative.  EF 83%.  Echocardiogram showed normal LV function with moderate pulmonary hypertension.  She uses 2 L nasal cannula at night however does not need to use it during the day.  She has a history of gastric antral vascular ectasia.  She underwent upper EGD in November 2017 and had coagulation with argon plasma for bleeding prophylaxis.  Hemoglobin has been maintained with periodic iron infusion.  She had 2 witnessed syncopal episodes in December 2017.  Carotid Doppler obtained on 11/09/2016 showed 1-39% disease in bilateral ICA.  30 day Heart monitor at the time showed PACs however no significant ventricular ectopy.  Last echocardiogram obtained on 11/27/2017 showed EF 60-65%, grade 2 DD, mild LVH, moderate AI, mildly dilated left atrium.  She was seen by Almyra Deforest PA-C in February 2019.  BP was elevated and lopressor was increased. CXR showed a small right pleural effusion that was stable. I saw her one year ago and she was doing well.   On follow up today she is doing OK. Now on continuous oxygen. Has chronic SOB. Very sedentary.  Denies any increase dyspnea or edema. No cough or fever.    Past Medical History:  Diagnosis Date  . Anemia   . Chronic bronchitis ([place])   . GAVE (gastric antral vascular ectasia) 09/12/12  . Hypertension   . Hypothyroidism   . Iron deficiency anemia 10/15/2012  . Iron deficiency anemia due to chronic blood loss 04/20/2014  . On home oxygen therapy    "2L/[place] at night" (11/07/2016)  . Palpitations   . Pneumonia 06/2016; 10/2016  . Syncope and collapse 11/06/2016    Past Surgical History:  Procedure Laterality Date   . BACK SURGERY    . BIOPSY  12/27/2016   Procedure: BIOPSY;  Surgeon: Rogene Houston, MD;  [facility];  Service: Endoscopy;;  gastric  . COLONOSCOPY  07/24/2012   Procedure: COLONOSCOPY;  Surgeon: Rogene Houston, MD;  [facility];  Service: Endoscopy;  Laterality: N/A;  200  . DILATION AND CURETTAGE OF UTERUS    . ESOPHAGOGASTRODUODENOSCOPY  09/12/2012   Procedure: ESOPHAGOGASTRODUODENOSCOPY (EGD);  Surgeon: Rogene Houston, MD;  [facility];  Service: Endoscopy;  Laterality: N/A;  325-changed to 200 per Ann-pt moved up to [place] to notify pt  . ESOPHAGOGASTRODUODENOSCOPY  11/01/2012   Procedure: ESOPHAGOGASTRODUODENOSCOPY (EGD);  Surgeon: Rogene Houston, MD;  [facility];  Service: Endoscopy;  Laterality: N/A;  1:20  . ESOPHAGOGASTRODUODENOSCOPY N/A 07/04/2013   Procedure: ESOPHAGOGASTRODUODENOSCOPY (EGD);  Surgeon: Rogene Houston, MD;  [facility];  Service: Endoscopy;  Laterality: N/A;  850  . ESOPHAGOGASTRODUODENOSCOPY N/A 08/06/2013   Procedure: ESOPHAGOGASTRODUODENOSCOPY (EGD);  Surgeon: Rogene Houston, MD;  [facility];  Service: Endoscopy;  Laterality: N/A;  1200  . ESOPHAGOGASTRODUODENOSCOPY N/A 10/31/2013   Procedure: ESOPHAGOGASTRODUODENOSCOPY (EGD);  Surgeon: Rogene Houston, MD;  [facility];  Service: Endoscopy;  Laterality: N/A;  125-moved to [place] notified pt  . ESOPHAGOGASTRODUODENOSCOPY N/A 10/23/2014   Procedure: ESOPHAGOGASTRODUODENOSCOPY (  EGD);  Surgeon: Rogene Houston, MD;  [facility];  Service: Endoscopy;  Laterality: N/A;  1020  . ESOPHAGOGASTRODUODENOSCOPY N/A 10/11/2016   Procedure: ESOPHAGOGASTRODUODENOSCOPY (EGD);  Surgeon: Rogene Houston, MD;  [facility];  Service: Endoscopy;  Laterality: N/A;  245  . ESOPHAGOGASTRODUODENOSCOPY N/A 12/27/2016   Procedure: ESOPHAGOGASTRODUODENOSCOPY (EGD);  Surgeon: Rogene Houston, MD;  [facility];   Service: Endoscopy;  Laterality: N/A;  1225  . HEEL SPUR SURGERY Bilateral   . HOT HEMOSTASIS  11/01/2012   Procedure: HOT HEMOSTASIS (ARGON PLASMA COAGULATION/BICAP);  Surgeon: Rogene Houston, MD;  [facility];  Service: Endoscopy;  Laterality: N/A;  . HOT HEMOSTASIS N/A 07/04/2013   Procedure: HOT HEMOSTASIS (ARGON PLASMA COAGULATION/BICAP);  Surgeon: Rogene Houston, MD;  [facility];  Service: Endoscopy;  Laterality: N/A;  . HOT HEMOSTASIS N/A 08/06/2013   Procedure: HOT HEMOSTASIS (ARGON PLASMA COAGULATION/BICAP);  Surgeon: Rogene Houston, MD;  [facility];  Service: Endoscopy;  Laterality: N/A;  . HOT HEMOSTASIS N/A 10/31/2013   Procedure: HOT HEMOSTASIS (ARGON PLASMA COAGULATION/BICAP);  Surgeon: Rogene Houston, MD;  [facility];  Service: Endoscopy;  Laterality: N/A;  . HOT HEMOSTASIS N/A 10/23/2014   Procedure: HOT HEMOSTASIS (ARGON PLASMA COAGULATION/BICAP);  Surgeon: Rogene Houston, MD;  [facility];  Service: Endoscopy;  Laterality: N/A;  . HOT HEMOSTASIS N/A 10/11/2016   Procedure: HOT HEMOSTASIS (ARGON PLASMA COAGULATION/BICAP);  Surgeon: Rogene Houston, MD;  [facility];  Service: Endoscopy;  Laterality: N/A;  . JOINT REPLACEMENT    . KNEE ARTHROSCOPY Right    "before replacement"  . LUMBAR DISC SURGERY     "bone spur  . LUMBAR FUSION  06/25/2014   L4 & L5  . SHOULDER SURGERY Right    "spur"  . TONSILLECTOMY AND ADENOIDECTOMY    . TOTAL ABDOMINAL HYSTERECTOMY    . TOTAL KNEE ARTHROPLASTY Right   . US ECHOCARDIOGRAPHY  03/12/2006   EF 55-60%    Current Medications: Outpatient Medications Prior to Visit  Medication Sig Dispense Refill  . albuterol (PROVENTIL) (2.5 MG/3ML) 0.083% nebulizer solution Take 2.5 mg by nebulization every 6 (six) hours as needed for wheezing or shortness of breath.    . cyanocobalamin 1000 MCG tablet Take 1,000 mcg by mouth daily.    . digoxin (DIGOX) 0.125 MG tablet  Take 1 tablet mouth daily except none on Sundays or as directed 30 tablet 5  . feeding supplement, ENSURE ENLIVE, (ENSURE ENLIVE) LIQD Take 237 mLs by mouth 2 (two) times daily between meals. (Patient taking differently: Take 237 mLs by mouth 3 (three) times daily between meals. )    . furosemide (LASIX) 20 MG tablet Take 1 tablet (20 mg total) by mouth daily. SCHEDULE OV FOR FURTHER REFILLS. 2nd Attempt. (Patient taking differently: Take 20 mg by mouth daily. Pt takes on Sun, Tues, Thurs, Sat only) 90 tablet 0  . hydrALAZINE (APRESOLINE) 10 MG tablet Take 1 tablet (10 mg total) by mouth 2 (two) times daily. 180 tablet 1  . levothyroxine (SYNTHROID, LEVOTHROID) 112 MCG tablet Take 112 mcg by mouth daily before breakfast.    . metoprolol tartrate (LOPRESSOR) 25 MG tablet TAKE 1 TABLET(25 MG) BY MOUTH TWICE DAILY, please call time for follow up appointment 60 tablet 3  . mirtazapine (REMERON) 15 MG tablet Take 15 mg by mouth at bedtime.    . pantoprazole (PROTONIX) 40 MG tablet TK  1 T PO D  5  . linaclotide (LINZESS) 72 MCG capsule Take 72 mcg by mouth daily before breakfast.     Facility-Administered Medications Prior to Visit  Medication Dose Route Frequency Provider Last Rate Last Dose  . 0.9 %  sodium chloride infusion   Intravenous Continuous Baird Cancer, PA-C 20 mL/hr at 11/16/17 1025       Allergies:   Flagyl [metronidazole], Avapro [irbesartan], Clarithromycin, Dexamethasone, Losartan potassium-hctz, Maxzide [hydrochlorothiazide w-triamterene], Ziac [bisoprolol-hydrochlorothiazide], Penicillins, and Sulfa drugs cross reactors   Social History   Socioeconomic History  . Marital status: Widowed    Spouse name: Not on file  . Number of children: Not on file  . Years of education: Not on file  . Highest education level: Not on file  Occupational History  . Not on file  Social Needs  . Financial resource strain: Not on file  . Food insecurity    Worry: Not on file    Inability:  Not on file  . Transportation needs    Medical: Not on file    Non-medical: Not on file  Tobacco Use  . Smoking status: Never Smoker  . Smokeless tobacco: Never Used  Substance and Sexual Activity  . Alcohol use: No  . Drug use: No  . Sexual activity: Never  Lifestyle  . Physical activity    Days per week: Not on file    Minutes per session: Not on file  . Stress: Not on file  Relationships  . Social Herbalist on phone: Not on file    Gets together: Not on file    Attends religious service: Not on file    Active member of club or organization: Not on file    Attends meetings of clubs or organizations: Not on file    Relationship status: Not on file  Other Topics Concern  . Not on file  Social History Narrative  . Not on file     Family History:  The patient's family history includes Heart attack in her father; Hypertension in her father and mother; Other in her brother and sister.   ROS:   Please see the history of present illness.    ROS All other systems reviewed and are negative.   PHYSICAL EXAM:   VS:  BP (!) 153/71   Pulse 69   Temp 97.9 F (36.6 C)   Ht 5\' 1"  (1.549 m)   Wt 115 lb 3.2 oz (52.3 kg)   SpO2 93%   BMI 21.77 kg/m    GENERAL:   elderly WF in NAD HEENT:  PERRL, EOMI, sclera are clear. Oropharynx is clear. NECK:  No jugular venous distention, carotid upstroke brisk and symmetric, no bruits, no thyromegaly or adenopathy LUNGS:  Clear to auscultation bilaterally CHEST:  Unremarkable HEART:  RRR,  PMI not displaced or sustained,S1 and S2 within normal limits, no S3, no S4: no clicks, no rubs, no murmurs ABD:  Soft, nontender. BS +, no masses or bruits. No hepatomegaly, no splenomegaly EXT:  2 + pulses throughout, lower legs are big but no pitting edema. , no cyanosis no clubbing SKIN:  Warm and dry.  No rashes NEURO:  Alert and oriented x 3. Cranial nerves II through XII intact. PSYCH:  Cognitively intact    Wt Readings from Last 3  Encounters:  05/13/19 115 lb 3.2 oz (52.3 kg)  12/18/18 112 lb 4.8 oz (50.9 kg)  08/19/18 111 lb 9.6 oz (50.6 kg)  Studies/Labs Reviewed:   EKG:  EKG is  ordered today.  NSR with nonspecific ST-T wave changes.   Recent Labs: 12/11/2018: ALT 26; BUN 35; Creatinine, Ser 1.03; Hemoglobin 13.7; Platelets 251; Potassium 4.3; Sodium 136   Lipid Panel    Component Value Date/Time   CHOL 146 09/16/2015 0855   TRIG 106 09/16/2015 0855   HDL 48 09/16/2015 0855   CHOLHDL 3.0 09/16/2015 0855   VLDL 21 09/16/2015 0855   LDLCALC 77 09/16/2015 0855   Dated 02/20/18: cholesterol 165, triglycerides 77, HDL 60, LDL 77. Dated 01/28/19: cholesterol 167, triglycerides 86, HDL 54, LDL 96.  Dated 03/14/19: CMET, CBC, and TFTs normal   Additional studies/ records that were reviewed today include:   Carotid US 11/09/2016 Summary:  - The vertebral arteries appear patent with antegrade flow. - Findings consistent with 1-39 percent stenosis involving the right internal carotid artery and the left internal carotid artery.   Echo 11/27/2017 LV EF: 60% -   65%  Study Conclusions  - Left ventricle: Wall thickness was increased in a pattern of mild   LVH. Systolic function was normal. The estimated ejection   fraction was in the range of 60% to 65%. Wall motion was normal;   there were no regional wall motion abnormalities. Features are   consistent with a pseudonormal left ventricular filling pattern,   with concomitant abnormal relaxation and increased filling   pressure (grade 2 diastolic dysfunction). Doppler parameters are   consistent with high ventricular filling pressure. - Aortic valve: Moderately calcified annulus. Trileaflet. Sclerosis   without stenosis. There was moderate regurgitation. - Mitral valve: Calcified annulus. Mildly thickened leaflets . - Left atrium: The atrium was mildly dilated. - Right atrium: The atrium was mildly dilated. - Tricuspid valve: There was mild  regurgitation.    ASSESSMENT:    1. Chronic diastolic heart failure ([place])   2. Essential hypertension   3. PAC (premature atrial contraction)      PLAN:  In order of problems listed above:  1. Chronic diastolic heart failure: She appears to be euvolemic on physical exam. Will continue lasix 20 mg daily.   2. Hypertension: Blood pressure is well controlled.  3. PACs: Denies any palpitation  I will follow up in one year     Medication Adjustments/Labs and Tests Ordered: Current medicines are reviewed at length with the patient today.  Concerns regarding medicines are outlined above.  Medication changes, Labs and Tests ordered today are listed in the Patient Instructions below. Patient Instructions  Continue your current therapy  Follow up in one year    Signed,  [place], MD  05/13/2019 1:56 PM    [facility] [place], [place], [place]  [address] Phone: [phone]; Fax: [phone]

## 2019-05-13 ENCOUNTER — Other Ambulatory Visit: Payer: Self-pay

## 2019-05-13 ENCOUNTER — Encounter: Payer: Self-pay | Admitting: Cardiology

## 2019-05-13 ENCOUNTER — Ambulatory Visit (INDEPENDENT_AMBULATORY_CARE_PROVIDER_SITE_OTHER): Payer: Medicare Other | Admitting: Cardiology

## 2019-05-13 VITALS — BP 153/71 | HR 69 | Temp 97.9°F | Ht 61.0 in | Wt 115.2 lb

## 2019-05-13 DIAGNOSIS — I10 Essential (primary) hypertension: Secondary | ICD-10-CM

## 2019-05-13 DIAGNOSIS — I491 Atrial premature depolarization: Secondary | ICD-10-CM | POA: Diagnosis not present

## 2019-05-13 DIAGNOSIS — I5032 Chronic diastolic (congestive) heart failure: Secondary | ICD-10-CM

## 2019-05-13 NOTE — Patient Instructions (Signed)
Continue your current therapy  Follow up in one year

## 2019-06-10 ENCOUNTER — Ambulatory Visit (INDEPENDENT_AMBULATORY_CARE_PROVIDER_SITE_OTHER): Payer: Medicare Other | Admitting: Internal Medicine

## 2019-06-11 ENCOUNTER — Other Ambulatory Visit: Payer: Self-pay

## 2019-06-11 ENCOUNTER — Inpatient Hospital Stay (HOSPITAL_COMMUNITY): Payer: Medicare Other | Attending: Hematology

## 2019-06-11 DIAGNOSIS — D500 Iron deficiency anemia secondary to blood loss (chronic): Secondary | ICD-10-CM | POA: Insufficient documentation

## 2019-06-11 LAB — CBC WITH DIFFERENTIAL/PLATELET
Abs Immature Granulocytes: 0.02 10*3/uL (ref 0.00–0.07)
Basophils Absolute: 0.1 10*3/uL (ref 0.0–0.1)
Basophils Relative: 1 %
Eosinophils Absolute: 0.2 10*3/uL (ref 0.0–0.5)
Eosinophils Relative: 3 %
HCT: 43.9 % (ref 36.0–46.0)
Hemoglobin: 13.5 g/dL (ref 12.0–15.0)
Immature Granulocytes: 0 %
Lymphocytes Relative: 14 %
Lymphs Abs: 1 10*3/uL (ref 0.7–4.0)
MCH: 32.1 pg (ref 26.0–34.0)
MCHC: 30.8 g/dL (ref 30.0–36.0)
MCV: 104.3 fL — ABNORMAL HIGH (ref 80.0–100.0)
Monocytes Absolute: 1.1 10*3/uL — ABNORMAL HIGH (ref 0.1–1.0)
Monocytes Relative: 14 %
Neutro Abs: 5 10*3/uL (ref 1.7–7.7)
Neutrophils Relative %: 68 %
Platelets: 219 10*3/uL (ref 150–400)
RBC: 4.21 MIL/uL (ref 3.87–5.11)
RDW: 13.7 % (ref 11.5–15.5)
WBC: 7.4 10*3/uL (ref 4.0–10.5)
nRBC: 0 % (ref 0.0–0.2)

## 2019-06-11 LAB — VITAMIN B12: Vitamin B-12: 1902 pg/mL — ABNORMAL HIGH (ref 180–914)

## 2019-06-11 LAB — FERRITIN: Ferritin: 26 ng/mL (ref 11–307)

## 2019-06-11 LAB — COMPREHENSIVE METABOLIC PANEL
ALT: 24 U/L (ref 0–44)
AST: 30 U/L (ref 15–41)
Albumin: 3.5 g/dL (ref 3.5–5.0)
Alkaline Phosphatase: 78 U/L (ref 38–126)
Anion gap: 9 (ref 5–15)
BUN: 39 mg/dL — ABNORMAL HIGH (ref 8–23)
CO2: 37 mmol/L — ABNORMAL HIGH (ref 22–32)
Calcium: 9.3 mg/dL (ref 8.9–10.3)
Chloride: 95 mmol/L — ABNORMAL LOW (ref 98–111)
Creatinine, Ser: 0.97 mg/dL (ref 0.44–1.00)
GFR calc Af Amer: >60 mL/min (ref >60)
GFR calc non Af Amer: 54 mL/min — ABNORMAL LOW (ref >60)
Glucose, Bld: 94 mg/dL (ref 70–99)
Potassium: 4.5 mmol/L (ref 3.5–5.1)
Sodium: 141 mmol/L (ref 135–145)
Total Bilirubin: 0.7 mg/dL (ref 0.3–1.2)
Total Protein: 7.1 g/dL (ref 6.5–8.1)

## 2019-06-11 LAB — FOLATE: Folate: 30.7 ng/mL (ref >5.9)

## 2019-06-11 LAB — IRON AND TIBC
Iron: 68 ug/dL (ref 28–170)
Saturation Ratios: 17 % (ref 10.4–31.8)
TIBC: 391 ug/dL (ref 250–450)
UIBC: 323 ug/dL

## 2019-06-18 ENCOUNTER — Encounter (HOSPITAL_COMMUNITY): Payer: Self-pay | Admitting: Nurse Practitioner

## 2019-06-18 ENCOUNTER — Other Ambulatory Visit: Payer: Self-pay

## 2019-06-18 ENCOUNTER — Inpatient Hospital Stay (HOSPITAL_COMMUNITY): Payer: Medicare Other | Attending: Hematology | Admitting: Nurse Practitioner

## 2019-06-18 DIAGNOSIS — D500 Iron deficiency anemia secondary to blood loss (chronic): Secondary | ICD-10-CM | POA: Insufficient documentation

## 2019-06-18 NOTE — Assessment & Plan Note (Addendum)
1.  Iron deficiency anemia: - History of gastric antral vascular ectasia, last EGD on 12/27/2016 showing normal esophagus, moderate GAVE with bleeding in the antrum and prepyloric region, status post APC. -She receives Feraheme every 12 weeks for maintenance.  Last infusion was on 10/22/2018. -Patient denies any bright red bleeding per rectum or melena.  She reports her energy is still holding pretty well throughout the day. - Labs on 06/11/2019 showed her her hemoglobin 13.5, WBC 7.4, platelets 219, ferritin 26, percent saturation 17. -We will set her up with 1 infusion of IV Feraheme at this time. - Patient will follow-up in 3 months with repeat labs

## 2019-06-18 NOTE — Progress Notes (Signed)
[place], [place] [address]   CLINIC:  Medical Oncology/Hematology  PCP:  Celene Squibb, MD [address] [place] [address] [phone]   REASON FOR VISIT: Follow-up for iron deficiency anemia.   CURRENT THERAPY: Intermittent iron infusions   INTERVAL HISTORY:  [name] 85 y.o. female returns for routine follow-up for iron deficiency anemia.  She reports she has been feeling great since her last visit.  Denies any bright red bleeding per rectum or melena.  Denies any easy bruising. Denies any nausea, vomiting, or diarrhea. Denies any new pains. Had not noticed any recent bleeding such as epistaxis, hematuria or hematochezia. Denies recent chest pain on exertion, shortness of breath on minimal exertion, pre-syncopal episodes, or palpitations. Denies any numbness or tingling in hands or feet. Denies any recent fevers, infections, or recent hospitalizations. Patient reports appetite at 100% and energy level at 50%.  She is eating well maintaining her weight at this time.   REVIEW OF SYSTEMS:  Review of Systems  Constitutional: Positive for fatigue.  All other systems reviewed and are negative.    PAST MEDICAL/SURGICAL HISTORY:  Past Medical History:  Diagnosis Date  . Anemia   . Chronic bronchitis ([place])   . GAVE (gastric antral vascular ectasia) 09/12/12  . Hypertension   . Hypothyroidism   . Iron deficiency anemia 10/15/2012  . Iron deficiency anemia due to chronic blood loss 04/20/2014  . On home oxygen therapy    "2L/[place] at night" (11/07/2016)  . Palpitations   . Pneumonia 06/2016; 10/2016  . Syncope and collapse 11/06/2016   Past Surgical History:  Procedure Laterality Date  . BACK SURGERY    . BIOPSY  12/27/2016   Procedure: BIOPSY;  Surgeon: Rogene Houston, MD;  [facility];  Service: Endoscopy;;  gastric  . COLONOSCOPY  07/24/2012   Procedure: COLONOSCOPY;  Surgeon: Rogene Houston, MD;  [facility];  Service: Endoscopy;  Laterality: N/A;  200  . DILATION AND CURETTAGE OF UTERUS    . ESOPHAGOGASTRODUODENOSCOPY  09/12/2012   Procedure: ESOPHAGOGASTRODUODENOSCOPY (EGD);  Surgeon: Rogene Houston, MD;  [facility];  Service: Endoscopy;  Laterality: N/A;  325-changed to 200 per Ann-pt moved up to [place] to notify pt  . ESOPHAGOGASTRODUODENOSCOPY  11/01/2012   Procedure: ESOPHAGOGASTRODUODENOSCOPY (EGD);  Surgeon: Rogene Houston, MD;  [facility];  Service: Endoscopy;  Laterality: N/A;  1:20  . ESOPHAGOGASTRODUODENOSCOPY N/A 07/04/2013   Procedure: ESOPHAGOGASTRODUODENOSCOPY (EGD);  Surgeon: Rogene Houston, MD;  [facility];  Service: Endoscopy;  Laterality: N/A;  850  . ESOPHAGOGASTRODUODENOSCOPY N/A 08/06/2013   Procedure: ESOPHAGOGASTRODUODENOSCOPY (EGD);  Surgeon: Rogene Houston, MD;  [facility];  Service: Endoscopy;  Laterality: N/A;  1200  . ESOPHAGOGASTRODUODENOSCOPY N/A 10/31/2013   Procedure: ESOPHAGOGASTRODUODENOSCOPY (EGD);  Surgeon: Rogene Houston, MD;  [facility];  Service: Endoscopy;  Laterality: N/A;  125-moved to [place] notified pt  . ESOPHAGOGASTRODUODENOSCOPY N/A 10/23/2014   Procedure: ESOPHAGOGASTRODUODENOSCOPY (EGD);  Surgeon: Rogene Houston, MD;  [facility];  Service: Endoscopy;  Laterality: N/A;  1020  . ESOPHAGOGASTRODUODENOSCOPY N/A 10/11/2016   Procedure: ESOPHAGOGASTRODUODENOSCOPY (EGD);  Surgeon: Rogene Houston, MD;  [facility];  Service: Endoscopy;  Laterality: N/A;  245  . ESOPHAGOGASTRODUODENOSCOPY N/A 12/27/2016   Procedure: ESOPHAGOGASTRODUODENOSCOPY (EGD);  Surgeon: Rogene Houston, MD;  [facility];  Service: Endoscopy;  Laterality: N/A;  1225  . HEEL SPUR SURGERY Bilateral   . HOT HEMOSTASIS  11/01/2012   Procedure: HOT HEMOSTASIS (ARGON PLASMA COAGULATION/BICAP);  Surgeon: Rogene Houston, MD;  [facility];  Service: Endoscopy;   Laterality: N/A;  . HOT HEMOSTASIS N/A 07/04/2013   Procedure: HOT HEMOSTASIS (ARGON PLASMA COAGULATION/BICAP);  Surgeon: Rogene Houston, MD;  [facility];  Service: Endoscopy;  Laterality: N/A;  . HOT HEMOSTASIS N/A 08/06/2013   Procedure: HOT HEMOSTASIS (ARGON PLASMA COAGULATION/BICAP);  Surgeon: Rogene Houston, MD;  [facility];  Service: Endoscopy;  Laterality: N/A;  . HOT HEMOSTASIS N/A 10/31/2013   Procedure: HOT HEMOSTASIS (ARGON PLASMA COAGULATION/BICAP);  Surgeon: Rogene Houston, MD;  [facility];  Service: Endoscopy;  Laterality: N/A;  . HOT HEMOSTASIS N/A 10/23/2014   Procedure: HOT HEMOSTASIS (ARGON PLASMA COAGULATION/BICAP);  Surgeon: Rogene Houston, MD;  [facility];  Service: Endoscopy;  Laterality: N/A;  . HOT HEMOSTASIS N/A 10/11/2016   Procedure: HOT HEMOSTASIS (ARGON PLASMA COAGULATION/BICAP);  Surgeon: Rogene Houston, MD;  [facility];  Service: Endoscopy;  Laterality: N/A;  . JOINT REPLACEMENT    . KNEE ARTHROSCOPY Right    "before replacement"  . LUMBAR DISC SURGERY     "bone spur  . LUMBAR FUSION  06/25/2014   L4 & L5  . SHOULDER SURGERY Right    "spur"  . TONSILLECTOMY AND ADENOIDECTOMY    . TOTAL ABDOMINAL HYSTERECTOMY    . TOTAL KNEE ARTHROPLASTY Right   . US ECHOCARDIOGRAPHY  03/12/2006   EF 55-60%     SOCIAL HISTORY:  Social History   Socioeconomic History  . Marital status: Widowed    Spouse name: Not on file  . Number of children: Not on file  . Years of education: Not on file  . Highest education level: Not on file  Occupational History  . Not on file  Social Needs  . Financial resource strain: Not on file  . Food insecurity    Worry: Not on file    Inability: Not on file  . Transportation needs    Medical: Not on file    Non-medical: Not on file  Tobacco Use  . Smoking status: Never Smoker  . Smokeless tobacco: Never Used  Substance and Sexual Activity  . Alcohol use: No   . Drug use: No  . Sexual activity: Never  Lifestyle  . Physical activity    Days per week: Not on file    Minutes per session: Not on file  . Stress: Not on file  Relationships  . Social Herbalist on phone: Not on file    Gets together: Not on file    Attends religious service: Not on file    Active member of club or organization: Not on file    Attends meetings of clubs or organizations: Not on file    Relationship status: Not on file  . Intimate partner violence    Fear of current or ex partner: Not on file    Emotionally abused: Not on file    Physically abused: Not on file    Forced sexual activity: Not on file  Other Topics Concern  . Not on file  Social History Narrative  . Not on file    FAMILY HISTORY:  Family History  Problem Relation Age of Onset  . Hypertension Mother   . Hypertension Father   . Heart attack Father   .  Other Sister        aneurysm on heart  . Other Brother        aneurysm on heart  . Breast cancer Neg Hx     CURRENT MEDICATIONS:  Outpatient Encounter Medications as of 06/18/2019  Medication Sig  . albuterol (PROVENTIL) (2.5 MG/3ML) 0.083% nebulizer solution Take 2.5 mg by nebulization every 6 (six) hours as needed for wheezing or shortness of breath.  . cyanocobalamin 1000 MCG tablet Take 1,000 mcg by mouth daily.  . digoxin (DIGOX) 0.125 MG tablet Take 1 tablet mouth daily except none on Sundays or as directed  . feeding supplement, ENSURE ENLIVE, (ENSURE ENLIVE) LIQD Take 237 mLs by mouth 2 (two) times daily between meals. (Patient taking differently: Take 237 mLs by mouth 3 (three) times daily between meals. )  . furosemide (LASIX) 20 MG tablet Take 1 tablet (20 mg total) by mouth daily. SCHEDULE OV FOR FURTHER REFILLS. 2nd Attempt. (Patient taking differently: Take 20 mg by mouth daily. Pt takes on Sun, Tues, Thurs, Sat only)  . hydrALAZINE (APRESOLINE) 10 MG tablet Take 1 tablet (10 mg total) by mouth 2 (two) times daily.  Marland Kitchen  levothyroxine (SYNTHROID, LEVOTHROID) 112 MCG tablet Take 112 mcg by mouth daily before breakfast.  . linaclotide (LINZESS) 72 MCG capsule Take 72 mcg by mouth daily before breakfast.  . metoprolol tartrate (LOPRESSOR) 25 MG tablet TAKE 1 TABLET(25 MG) BY MOUTH TWICE DAILY, please call time for follow up appointment  . mirtazapine (REMERON) 15 MG tablet Take 15 mg by mouth at bedtime.  . pantoprazole (PROTONIX) 40 MG tablet TK 1 T PO D   Facility-Administered Encounter Medications as of 06/18/2019  Medication  . 0.9 %  sodium chloride infusion    ALLERGIES:  Allergies  Allergen Reactions  . Flagyl [Metronidazole] Diarrhea, Nausea Only and Other (See Comments)    Dizziness and feeling of inbalance  . Avapro [Irbesartan] Other (See Comments)    Cough   . Clarithromycin Other (See Comments)    Unknown   . Dexamethasone     UPSET STOMACH  . Losartan Potassium-Hctz Other (See Comments)    Unknown  . Maxzide [Hydrochlorothiazide W-Triamterene] Other (See Comments)    Unknown   . Ziac [Bisoprolol-Hydrochlorothiazide]     Thinks caused itching and cough 08/12/12  . Penicillins Swelling and Rash    Has patient had a PCN reaction causing immediate rash, facial/tongue/throat swelling, SOB or lightheadedness with hypotension: no Has patient had a PCN reaction causing severe rash involving mucus membranes or skin necrosis: no Has patient had a PCN reaction that required hospitalization {no Has patient had a PCN reaction occurring within the last 10 years: {no If all of the above answers are "NO", then may proceed with Cephalosporin use.  . Sulfa Drugs Cross Reactors Rash     PHYSICAL EXAM:  ECOG Performance status: 1  Vitals:   06/18/19 0930 06/18/19 0933  BP: (!) 158/68 (!) 156/63  Pulse: 70   Resp: 20   Temp: 97.7 F (36.5 C)   SpO2: (!) 88%    Filed Weights   06/18/19 0930  Weight: 113 lb (51.3 kg)  She reports she wears home O2.  However she does not wear it when she goes out  shopping or to doctor's appointments.  She did not want [place] to put her on oxygen here in the department.  She wished to go home and get back on her own home O2.  Physical Exam Constitutional:  Appearance: Normal appearance. She is normal weight.  Cardiovascular:     Rate and Rhythm: Normal rate and regular rhythm.     Heart sounds: Normal heart sounds.  Pulmonary:     Effort: Pulmonary effort is normal.     Breath sounds: Normal breath sounds.  Abdominal:     General: Bowel sounds are normal.     Palpations: Abdomen is soft.  Musculoskeletal: Normal range of motion.  Skin:    General: Skin is warm and dry.  Neurological:     Mental Status: She is alert and oriented to person, place, and time. Mental status is at baseline.  Psychiatric:        Mood and Affect: Mood normal.        Behavior: Behavior normal.        Thought Content: Thought content normal.        Judgment: Judgment normal.      LABORATORY DATA:  I have reviewed the labs as listed.  CBC    Component Value Date/Time   WBC 7.4 06/11/2019 1314   RBC 4.21 06/11/2019 1314   HGB 13.5 06/11/2019 1314   HCT 43.9 06/11/2019 1314   PLT 219 06/11/2019 1314   MCV 104.3 (H) 06/11/2019 1314   MCH 32.1 06/11/2019 1314   MCHC 30.8 06/11/2019 1314   RDW 13.7 06/11/2019 1314   LYMPHSABS 1.0 06/11/2019 1314   MONOABS 1.1 (H) 06/11/2019 1314   EOSABS 0.2 06/11/2019 1314   BASOSABS 0.1 06/11/2019 1314   CMP Latest Ref Rng & Units 06/11/2019 12/11/2018 08/13/2018  Glucose 70 - 99 mg/dL 94 94 94  BUN 8 - 23 mg/dL 39(H) 35(H) 27(H)  Creatinine 0.44 - 1.00 mg/dL 0.97 1.03(H) 0.94  Sodium 135 - 145 mmol/L 141 136 134(L)  Potassium 3.5 - 5.1 mmol/L 4.5 4.3 4.4  Chloride 98 - 111 mmol/L 95(L) 93(L) 91(L)  CO2 22 - 32 mmol/L 37(H) 35(H) 34(H)  Calcium 8.9 - 10.3 mg/dL 9.3 9.3 9.1  Total Protein 6.5 - 8.1 g/dL 7.1 6.8 7.0  Total Bilirubin 0.3 - 1.2 mg/dL 0.7 0.5 0.6  Alkaline Phos 38 - 126 U/L 78 66 69  AST 15 - 41 U/L 30 30  23   ALT 0 - 44 U/L 24 26 18      I personally performed a face-to-face visit.  All questions were answered to patient's stated satisfaction. Encouraged patient to call with any new concerns or questions before his next visit to the cancer center and we can certain see him sooner, if needed.     ASSESSMENT & PLAN:   Iron deficiency anemia due to chronic blood loss 1.  Iron deficiency anemia: - History of gastric antral vascular ectasia, last EGD on 12/27/2016 showing normal esophagus, moderate GAVE with bleeding in the antrum and prepyloric region, status post APC. -She receives Feraheme every 12 weeks for maintenance.  Last infusion was on 10/22/2018. -Patient denies any bright red bleeding per rectum or melena.  She reports her energy is still holding pretty well throughout the day. - Labs on 06/11/2019 showed her her hemoglobin 13.5, WBC 7.4, platelets 219, ferritin 26, percent saturation 17. -We will set her up with 1 infusion of IV Feraheme at this time. - Patient will follow-up in 3 months with repeat labs      Orders placed this encounter:  Orders Placed This Encounter  Procedures  . Lactate dehydrogenase  . CBC with Differential/Platelet  . Comprehensive metabolic panel  . Ferritin  . Iron  and TIBC  . Vitamin B12  . VITAMIN D 25 Hydroxy (Vit-D Deficiency, Fractures)  . Folate     Francene Finders, FNP-C [place] [phone]

## 2019-06-18 NOTE — Patient Instructions (Addendum)
Exam and discussion today with Francene Finders, NP. Return to clinic for lab work and follow-up as scheduled. Return for iron infusions as scheduled.  [place] at [facility] Discharge Instructions  Follow up in 3 months with labs  We will set up an iron infusion.    Thank you for choosing [place] at [facility] to provide your oncology and hematology care.  To afford each patient quality time with our provider, please arrive at least 15 minutes before your scheduled appointment time.   If you have a lab appointment with the [facility] please come in thru the  Main Entrance and check in at the main information desk  You need to re-schedule your appointment should you arrive 10 or more minutes late.  We strive to give you quality time with our providers, and arriving late affects you and other patients whose appointments are after yours.  Also, if you no show three or more times for appointments you may be dismissed from the clinic at the providers discretion.     Again, thank you for choosing [facility].  Our hope is that these requests will decrease the amount of time that you wait before being seen by our physicians.       _____________________________________________________________  Should you have questions after your visit to [facility], please contact our office at [phone] between the hours of 8:00 a.m. and 4:30 p.m.  Voicemails left after 4:00 p.m. will not be returned until the following business day.  For prescription refill requests, have your pharmacy contact our office and allow 72 hours.    Cancer Center Support Programs:   > Cancer Support Group  2nd Tuesday of the month 1pm-2pm, Journey Room

## 2019-06-20 ENCOUNTER — Inpatient Hospital Stay (HOSPITAL_COMMUNITY): Payer: Medicare Other

## 2019-06-20 ENCOUNTER — Other Ambulatory Visit: Payer: Self-pay

## 2019-06-20 ENCOUNTER — Encounter (HOSPITAL_COMMUNITY): Payer: Self-pay

## 2019-06-20 VITALS — BP 153/62 | HR 66 | Temp 97.1°F | Resp 18

## 2019-06-20 DIAGNOSIS — D500 Iron deficiency anemia secondary to blood loss (chronic): Secondary | ICD-10-CM

## 2019-06-20 MED ORDER — SODIUM CHLORIDE 0.9 % IV SOLN
510.0000 mg | Freq: Once | INTRAVENOUS | Status: AC
  Administered 2019-06-20: 510 mg via INTRAVENOUS
  Filled 2019-06-20: qty 510

## 2019-06-20 MED ORDER — SODIUM CHLORIDE 0.9 % IV SOLN
Freq: Once | INTRAVENOUS | Status: AC
  Administered 2019-06-20: 09:00:00 via INTRAVENOUS

## 2019-06-20 NOTE — Patient Instructions (Signed)
[facility] at [facility] Discharge Instructions  Received Feraheme infusion today. Follow-up as scheduled. Call clinic for any questions or concerns   Thank you for choosing [facility] at [facility] to provide your oncology and hematology care.  To afford each patient quality time with our provider, please arrive at least 15 minutes before your scheduled appointment time.   If you have a lab appointment with the Cancer Center please come in thru the  Main Entrance and check in at the main information desk  You need to re-schedule your appointment should you arrive 10 or more minutes late.  We strive to give you quality time with our providers, and arriving late affects you and other patients whose appointments are after yours.  Also, if you no show three or more times for appointments you may be dismissed from the clinic at the providers discretion.     Again, thank you for choosing [facility].  Our hope is that these requests will decrease the amount of time that you wait before being seen by our physicians.       _____________________________________________________________  Should you have questions after your visit to [facility], please contact our office at [phone] between the hours of 8:00 a.m. and 4:30 p.m.  Voicemails left after 4:00 p.m. will not be returned until the following business day.  For prescription refill requests, have your pharmacy contact our office and allow 72 hours.    Cancer Center Support Programs:   > Cancer Support Group  2nd Tuesday of the month 1pm-2pm, Journey Room   

## 2019-06-20 NOTE — Progress Notes (Signed)
[name] tolerated Feraheme infusion well without complaints or incident. Peripheral IV site with positive blood return noted prior to and after infusion. VSS upon discharge. Pt discharged via wheelchair in satisfactory condition

## 2019-08-01 DIAGNOSIS — I739 Peripheral vascular disease, unspecified: Secondary | ICD-10-CM | POA: Diagnosis not present

## 2019-08-01 DIAGNOSIS — I272 Pulmonary hypertension, unspecified: Secondary | ICD-10-CM | POA: Diagnosis not present

## 2019-08-01 DIAGNOSIS — D509 Iron deficiency anemia, unspecified: Secondary | ICD-10-CM | POA: Diagnosis not present

## 2019-08-01 DIAGNOSIS — I5032 Chronic diastolic (congestive) heart failure: Secondary | ICD-10-CM | POA: Diagnosis not present

## 2019-08-01 DIAGNOSIS — F331 Major depressive disorder, recurrent, moderate: Secondary | ICD-10-CM | POA: Diagnosis not present

## 2019-08-01 DIAGNOSIS — K219 Gastro-esophageal reflux disease without esophagitis: Secondary | ICD-10-CM | POA: Diagnosis not present

## 2019-08-01 DIAGNOSIS — G64 Other disorders of peripheral nervous system: Secondary | ICD-10-CM | POA: Diagnosis not present

## 2019-08-01 DIAGNOSIS — J188 Other pneumonia, unspecified organism: Secondary | ICD-10-CM | POA: Diagnosis not present

## 2019-08-01 DIAGNOSIS — F419 Anxiety disorder, unspecified: Secondary | ICD-10-CM | POA: Diagnosis not present

## 2019-08-01 DIAGNOSIS — R0781 Pleurodynia: Secondary | ICD-10-CM | POA: Diagnosis not present

## 2019-08-01 DIAGNOSIS — I491 Atrial premature depolarization: Secondary | ICD-10-CM | POA: Diagnosis not present

## 2019-08-01 DIAGNOSIS — I130 Hypertensive heart and chronic kidney disease with heart failure and stage 1 through stage 4 chronic kidney disease, or unspecified chronic kidney disease: Secondary | ICD-10-CM | POA: Diagnosis not present

## 2019-08-01 DIAGNOSIS — K5900 Constipation, unspecified: Secondary | ICD-10-CM | POA: Diagnosis not present

## 2019-08-01 DIAGNOSIS — F39 Unspecified mood [affective] disorder: Secondary | ICD-10-CM | POA: Diagnosis not present

## 2019-08-01 DIAGNOSIS — K31819 Angiodysplasia of stomach and duodenum without bleeding: Secondary | ICD-10-CM | POA: Diagnosis not present

## 2019-08-01 DIAGNOSIS — I499 Cardiac arrhythmia, unspecified: Secondary | ICD-10-CM | POA: Diagnosis not present

## 2019-08-01 DIAGNOSIS — N183 Chronic kidney disease, stage 3 (moderate): Secondary | ICD-10-CM | POA: Diagnosis not present

## 2019-08-01 DIAGNOSIS — E039 Hypothyroidism, unspecified: Secondary | ICD-10-CM | POA: Diagnosis not present

## 2019-08-01 DIAGNOSIS — Z8719 Personal history of other diseases of the digestive system: Secondary | ICD-10-CM | POA: Diagnosis not present

## 2019-08-01 DIAGNOSIS — G4700 Insomnia, unspecified: Secondary | ICD-10-CM | POA: Diagnosis not present

## 2019-08-01 DIAGNOSIS — I10 Essential (primary) hypertension: Secondary | ICD-10-CM | POA: Diagnosis not present

## 2019-08-01 DIAGNOSIS — I270 Primary pulmonary hypertension: Secondary | ICD-10-CM | POA: Diagnosis not present

## 2019-08-12 ENCOUNTER — Other Ambulatory Visit: Payer: Self-pay

## 2019-08-12 ENCOUNTER — Encounter (INDEPENDENT_AMBULATORY_CARE_PROVIDER_SITE_OTHER): Payer: Self-pay | Admitting: Nurse Practitioner

## 2019-08-12 ENCOUNTER — Ambulatory Visit (INDEPENDENT_AMBULATORY_CARE_PROVIDER_SITE_OTHER): Payer: Medicare Other | Admitting: Nurse Practitioner

## 2019-08-12 VITALS — BP 153/81 | HR 82 | Temp 98.2°F | Ht 61.0 in | Wt 113.1 lb

## 2019-08-12 DIAGNOSIS — K31819 Angiodysplasia of stomach and duodenum without bleeding: Secondary | ICD-10-CM

## 2019-08-12 NOTE — Patient Instructions (Addendum)
1. I will contact you after I receive your most recent iron and ferritin levels from [facility]  2. I will call you after I receive your recent lab results   3. Follow up in the office in 6 months and as needed   4. Call our office if you develop black stools or rectal bleeding

## 2019-08-12 NOTE — Progress Notes (Addendum)
Subjective:    Patient ID: [name], female    DOB: 25-Jan-1933, 85 y.o.   MRN: [id number]  HPI [name] is a 85 year old female with a past medical history significant for hypertension, hypothyroidism, iron deficiency anemia, gastric antral vascular ectasia (GAVE). Her last EGD 12/27/2016 showed a normal esophagus, moderate GAVE and bleeding in the antrum and prepyloric region status post APC.  She is taking Protonix 40 mg once daily.  No NSAIDs.  She is followed by hematologist Dr.Sreedhar Delton Coombes. Her most recent Feraheme infusion was 06/2019.  Her most recent CBC was 08/01/2019, see results below.  Iron level and ferritin levels were not drawn at that time.  Labs 08/01/2019: WBC 8.4.  RBC 4.19.  Hemoglobin 13.6.  Hematocrit 42.0.  Platelet 252. Labs 06/11/2019: WBC 7.4.  RBC 4.21.  Hemoglobin 13.5.  Hematocrit 43.9.  MCV 104.3.  Platelet 219.  Iron 68.  TIBC 391.  Ferritin 26.  Folate 307.  Vitamin B12 1902.   She presents today accompanied by her daughter.  She sometimes feels that food must be in the right position in her mouth before she can swallow.  Food passes down the esophagus without any difficulty.  No difficulty swallowing pills or liquids.  She is drinking 3 cans of Ensure Plus daily.  She is eating soft foods, however, her daughter reports her food intake is low.  No weight loss.  She reports her weight has been stable at 113 pounds for the past year.  She is passing a normal brown formed stool every 2 to 3 days.  No rectal bleeding or hematochezia.  She is wearing O2 2 L nasal cannula at home due to having chronic SOB.  No other complaints today.  12/27/2016 EGD: - Normal esophagus. - Z-line regular, 40 cm from the incisors. - A few gastric polyps. - Gastric antral vascular ectasia with bleeding covering polypoidal nodular mucosa which is almost circumferential. Treated with argon plasma coagulation (APC). Biopsy taken long with snare polypectomy with removal of 10 mm of  this lesion. - Chronic duodenitis. - Normal second portion of the duodenum.  Colonoscopy 07/24/2012: Prep excellent. Normal terminal ileal mucosa. Few areas at cecum and proximal ascending colon covered with specks of blood but no erosions or arteriovenous malformations noted. 3 mm hyperplastic polyp ablated via cold biopsy from distal sigmoid colon. Normal rectal mucosa and anal rectal junction. No further colonoscopies recommended by Dr. Dorien Chihuahua unless clinically indicated.  Past Medical History:  Diagnosis Date  . Anemia   . Chronic bronchitis ([place])   . GAVE (gastric antral vascular ectasia) 09/12/12  . Hypertension   . Hypothyroidism   . Iron deficiency anemia 10/15/2012  . Iron deficiency anemia due to chronic blood loss 04/20/2014  . On home oxygen therapy    "2L/[place] at night" (11/07/2016)  . Palpitations   . Pneumonia 06/2016; 10/2016  . Syncope and collapse 11/06/2016   Past Surgical History:  Procedure Laterality Date  . BACK SURGERY    . BIOPSY  12/27/2016   Procedure: BIOPSY;  Surgeon: Rogene Houston, MD;  [facility];  Service: Endoscopy;;  gastric  . COLONOSCOPY  07/24/2012   Procedure: COLONOSCOPY;  Surgeon: Rogene Houston, MD;  [facility];  Service: Endoscopy;  Laterality: N/A;  200  . DILATION AND CURETTAGE OF UTERUS    . ESOPHAGOGASTRODUODENOSCOPY  09/12/2012   Procedure: ESOPHAGOGASTRODUODENOSCOPY (EGD);  Surgeon: Rogene Houston, MD;  [facility];  Service:  Endoscopy;  Laterality: N/A;  325-changed to 200 per Ann-pt moved up to [place] to notify pt  . ESOPHAGOGASTRODUODENOSCOPY  11/01/2012   Procedure: ESOPHAGOGASTRODUODENOSCOPY (EGD);  Surgeon: Rogene Houston, MD;  [facility];  Service: Endoscopy;  Laterality: N/A;  1:20  . ESOPHAGOGASTRODUODENOSCOPY N/A 07/04/2013   Procedure: ESOPHAGOGASTRODUODENOSCOPY (EGD);  Surgeon: Rogene Houston, MD;  [facility];  Service: Endoscopy;  Laterality: N/A;  850  .  ESOPHAGOGASTRODUODENOSCOPY N/A 08/06/2013   Procedure: ESOPHAGOGASTRODUODENOSCOPY (EGD);  Surgeon: Rogene Houston, MD;  [facility];  Service: Endoscopy;  Laterality: N/A;  1200  . ESOPHAGOGASTRODUODENOSCOPY N/A 10/31/2013   Procedure: ESOPHAGOGASTRODUODENOSCOPY (EGD);  Surgeon: Rogene Houston, MD;  [facility];  Service: Endoscopy;  Laterality: N/A;  125-moved to [place] notified pt  . ESOPHAGOGASTRODUODENOSCOPY N/A 10/23/2014   Procedure: ESOPHAGOGASTRODUODENOSCOPY (EGD);  Surgeon: Rogene Houston, MD;  [facility];  Service: Endoscopy;  Laterality: N/A;  1020  . ESOPHAGOGASTRODUODENOSCOPY N/A 10/11/2016   Procedure: ESOPHAGOGASTRODUODENOSCOPY (EGD);  Surgeon: Rogene Houston, MD;  [facility];  Service: Endoscopy;  Laterality: N/A;  245  . ESOPHAGOGASTRODUODENOSCOPY N/A 12/27/2016   Procedure: ESOPHAGOGASTRODUODENOSCOPY (EGD);  Surgeon: Rogene Houston, MD;  [facility];  Service: Endoscopy;  Laterality: N/A;  1225  . HEEL SPUR SURGERY Bilateral   . HOT HEMOSTASIS  11/01/2012   Procedure: HOT HEMOSTASIS (ARGON PLASMA COAGULATION/BICAP);  Surgeon: Rogene Houston, MD;  [facility];  Service: Endoscopy;  Laterality: N/A;  . HOT HEMOSTASIS N/A 07/04/2013   Procedure: HOT HEMOSTASIS (ARGON PLASMA COAGULATION/BICAP);  Surgeon: Rogene Houston, MD;  [facility];  Service: Endoscopy;  Laterality: N/A;  . HOT HEMOSTASIS N/A 08/06/2013   Procedure: HOT HEMOSTASIS (ARGON PLASMA COAGULATION/BICAP);  Surgeon: Rogene Houston, MD;  [facility];  Service: Endoscopy;  Laterality: N/A;  . HOT HEMOSTASIS N/A 10/31/2013   Procedure: HOT HEMOSTASIS (ARGON PLASMA COAGULATION/BICAP);  Surgeon: Rogene Houston, MD;  [facility];  Service: Endoscopy;  Laterality: N/A;  . HOT HEMOSTASIS N/A 10/23/2014   Procedure: HOT HEMOSTASIS (ARGON PLASMA COAGULATION/BICAP);  Surgeon: Rogene Houston, MD;  [facility];  Service: Endoscopy;  Laterality: N/A;  . HOT HEMOSTASIS N/A 10/11/2016   Procedure: HOT HEMOSTASIS (ARGON PLASMA COAGULATION/BICAP);  Surgeon: Rogene Houston, MD;  [facility];  Service: Endoscopy;  Laterality: N/A;  . JOINT REPLACEMENT    . KNEE ARTHROSCOPY Right    "before replacement"  . LUMBAR DISC SURGERY     "bone spur  . LUMBAR FUSION  06/25/2014   L4 & L5  . SHOULDER SURGERY Right    "spur"  . TONSILLECTOMY AND ADENOIDECTOMY    . TOTAL ABDOMINAL HYSTERECTOMY    . TOTAL KNEE ARTHROPLASTY Right   . US ECHOCARDIOGRAPHY  03/12/2006   EF 55-60%   Current Outpatient Medications on File Prior to Visit  Medication Sig Dispense Refill  . albuterol (PROVENTIL) (2.5 MG/3ML) 0.083% nebulizer solution Take 2.5 mg by nebulization every 6 (six) hours as needed for wheezing or shortness of breath.    . cyanocobalamin 1000 MCG tablet Take 1,000 mcg by mouth daily.    . digoxin (DIGOX) 0.125 MG tablet Take 1 tablet mouth daily except none on Sundays or as directed 30 tablet 5  . feeding supplement, ENSURE ENLIVE, (ENSURE ENLIVE) LIQD Take 237 mLs by mouth 2 (two) times daily between meals. (Patient taking  differently: Take 237 mLs by mouth 3 (three) times daily between meals. )    . furosemide (LASIX) 20 MG tablet Take 1 tablet (20 mg total) by mouth daily. SCHEDULE OV FOR FURTHER REFILLS. 2nd Attempt. (Patient taking differently: Take 20 mg by mouth daily. Pt takes on Sun, Tues, Thurs, Sat only) 90 tablet 0  . hydrALAZINE (APRESOLINE) 10 MG tablet Take 1 tablet (10 mg total) by mouth 2 (two) times daily. 180 tablet 1  . levothyroxine (SYNTHROID, LEVOTHROID) 112 MCG tablet Take 112 mcg by mouth daily before breakfast.    . metoprolol tartrate (LOPRESSOR) 25 MG tablet TAKE 1 TABLET(25 MG) BY MOUTH TWICE DAILY, please call time for follow up appointment 60 tablet 3  . mirtazapine (REMERON) 15 MG tablet Take 30 mg by mouth at bedtime.     . pantoprazole (PROTONIX) 40 MG tablet TK  1 T PO D  5  . linaclotide (LINZESS) 72 MCG capsule Take 72 mcg by mouth daily before breakfast.     Current Facility-Administered Medications on File Prior to Visit  Medication Dose Route Frequency Provider Last Rate Last Dose  . 0.9 %  sodium chloride infusion   Intravenous Continuous [facility], PA-C 20 mL/hr at 11/16/17 1025     Allergies  Allergen Reactions  . Flagyl [Metronidazole] Diarrhea, Nausea Only and Other (See Comments)    Dizziness and feeling of inbalance  . Avapro [Irbesartan] Other (See Comments)    Cough   . Clarithromycin Other (See Comments)    Unknown   . Dexamethasone     UPSET STOMACH  . Losartan Potassium-Hctz Other (See Comments)    Unknown  . Maxzide [Hydrochlorothiazide W-Triamterene] Other (See Comments)    Unknown   . Ziac [Bisoprolol-Hydrochlorothiazide]     Thinks caused itching and cough 08/12/12  . Penicillins Swelling and Rash    Has patient had a PCN reaction causing immediate rash, facial/tongue/throat swelling, SOB or lightheadedness with hypotension: no Has patient had a PCN reaction causing severe rash involving mucus membranes or skin necrosis: no Has patient had a PCN reaction that required hospitalization {no Has patient had a PCN reaction occurring within the last 10 years: {no If all of the above answers are "NO", then may proceed with Cephalosporin use.  . Sulfa Drugs Cross Reactors Rash    Review of Systems see HPI, all other systems reviewed and are negative     Objective:   Physical Exam  BP (!) 153/81   Pulse 82   Temp 98.2 F (36.8 C) (Oral)   Ht 5\' 1"  (1.549 m)   Wt 113 lb 1.6 oz (51.3 kg)   BMI 21.37 kg/m  General: 85 year old female wearing O2 nasal cannula no acute distress Eyes: Sclera nonicteric, conjunctiva pink Mouth: Absent upper dentition, no ulcers or lesions Heart: Rhythm slightly irregular, no murmurs Lungs: Bibasilar crackles, no wheezes or rhonchi Abdomen: Soft, nontender, no masses or  organomegaly, positive bowel sounds all 4 quadrants Extremities: 2+ pitting edema to the distal tibia and ankles bilaterally, left > right crackes in bases, no dentrues, L > R edema 2+ Neuro: Alert and oriented x4, speech is clear, she responds to questions appropriately, her gait is slow but steady    Assessment & Plan:   54.  85 year old female with GAVE with associated iron deficiency anemia.  Last Feraheme infusion was 06/2019.  08/01/2019 hemoglobin 13.6 and hematocrit 42.0.   -I requested a copy of her most recent iron and ferritin studies which I  presume were done after her August 2020 Feraheme infusion -EGD to be completed if iron and ferritin levels have decreased  2.  Weight loss.  No further weight loss -Continue with Ensure +3 cans daily, soft diet as tolerated  Further recommendations per Dr. Laural Golden Follow-up in the office in 6 months and as needed

## 2019-09-11 DIAGNOSIS — Z23 Encounter for immunization: Secondary | ICD-10-CM | POA: Diagnosis not present

## 2019-09-15 ENCOUNTER — Other Ambulatory Visit: Payer: Self-pay | Admitting: *Deleted

## 2019-09-15 MED ORDER — METOPROLOL TARTRATE 25 MG PO TABS: ORAL_TABLET | ORAL | 2 refills | Status: AC

## 2019-09-18 ENCOUNTER — Other Ambulatory Visit: Payer: Self-pay

## 2019-09-18 ENCOUNTER — Inpatient Hospital Stay (HOSPITAL_COMMUNITY): Payer: Medicare Other | Attending: Hematology

## 2019-09-18 DIAGNOSIS — E538 Deficiency of other specified B group vitamins: Secondary | ICD-10-CM | POA: Diagnosis not present

## 2019-09-18 DIAGNOSIS — D509 Iron deficiency anemia, unspecified: Secondary | ICD-10-CM | POA: Insufficient documentation

## 2019-09-18 DIAGNOSIS — D500 Iron deficiency anemia secondary to blood loss (chronic): Secondary | ICD-10-CM

## 2019-09-18 LAB — COMPREHENSIVE METABOLIC PANEL
ALT: 22 U/L (ref 0–44)
AST: 27 U/L (ref 15–41)
Albumin: 3.4 g/dL — ABNORMAL LOW (ref 3.5–5.0)
Alkaline Phosphatase: 85 U/L (ref 38–126)
Anion gap: 10 (ref 5–15)
BUN: 39 mg/dL — ABNORMAL HIGH (ref 8–23)
CO2: 38 mmol/L — ABNORMAL HIGH (ref 22–32)
Calcium: 9.5 mg/dL (ref 8.9–10.3)
Chloride: 95 mmol/L — ABNORMAL LOW (ref 98–111)
Creatinine, Ser: 0.94 mg/dL (ref 0.44–1.00)
GFR calc Af Amer: >60 mL/min (ref >60)
GFR calc non Af Amer: 56 mL/min — ABNORMAL LOW (ref >60)
Glucose, Bld: 123 mg/dL — ABNORMAL HIGH (ref 70–99)
Potassium: 4.3 mmol/L (ref 3.5–5.1)
Sodium: 143 mmol/L (ref 135–145)
Total Bilirubin: 0.3 mg/dL (ref 0.3–1.2)
Total Protein: 7 g/dL (ref 6.5–8.1)

## 2019-09-18 LAB — CBC WITH DIFFERENTIAL/PLATELET
Abs Immature Granulocytes: 0.02 10*3/uL (ref 0.00–0.07)
Basophils Absolute: 0.1 10*3/uL (ref 0.0–0.1)
Basophils Relative: 1 %
Eosinophils Absolute: 0.1 10*3/uL (ref 0.0–0.5)
Eosinophils Relative: 1 %
HCT: 44.1 % (ref 36.0–46.0)
Hemoglobin: 13.4 g/dL (ref 12.0–15.0)
Immature Granulocytes: 0 %
Lymphocytes Relative: 13 %
Lymphs Abs: 1 10*3/uL (ref 0.7–4.0)
MCH: 33.1 pg (ref 26.0–34.0)
MCHC: 30.4 g/dL (ref 30.0–36.0)
MCV: 108.9 fL — ABNORMAL HIGH (ref 80.0–100.0)
Monocytes Absolute: 0.9 10*3/uL (ref 0.1–1.0)
Monocytes Relative: 11 %
Neutro Abs: 6.2 10*3/uL (ref 1.7–7.7)
Neutrophils Relative %: 74 %
Platelets: 246 10*3/uL (ref 150–400)
RBC: 4.05 MIL/uL (ref 3.87–5.11)
RDW: 13.2 % (ref 11.5–15.5)
WBC: 8.3 10*3/uL (ref 4.0–10.5)
nRBC: 0 % (ref 0.0–0.2)

## 2019-09-18 LAB — IRON AND TIBC
Iron: 89 ug/dL (ref 28–170)
Saturation Ratios: 29 % (ref 10.4–31.8)
TIBC: 312 ug/dL (ref 250–450)
UIBC: 223 ug/dL

## 2019-09-18 LAB — VITAMIN D 25 HYDROXY (VIT D DEFICIENCY, FRACTURES): Vit D, 25-Hydroxy: 58.44 ng/mL (ref 30–100)

## 2019-09-18 LAB — FOLATE: Folate: 23.3 ng/mL (ref >5.9)

## 2019-09-18 LAB — FERRITIN: Ferritin: 73 ng/mL (ref 11–307)

## 2019-09-18 LAB — LACTATE DEHYDROGENASE: LDH: 157 U/L (ref 98–192)

## 2019-09-18 LAB — VITAMIN B12: Vitamin B-12: 2110 pg/mL — ABNORMAL HIGH (ref 180–914)

## 2019-09-25 ENCOUNTER — Encounter (HOSPITAL_COMMUNITY): Payer: Self-pay | Admitting: Nurse Practitioner

## 2019-09-25 ENCOUNTER — Other Ambulatory Visit: Payer: Self-pay

## 2019-09-25 ENCOUNTER — Inpatient Hospital Stay (HOSPITAL_BASED_OUTPATIENT_CLINIC_OR_DEPARTMENT_OTHER): Payer: Medicare Other | Admitting: Nurse Practitioner

## 2019-09-25 DIAGNOSIS — D500 Iron deficiency anemia secondary to blood loss (chronic): Secondary | ICD-10-CM

## 2019-09-25 DIAGNOSIS — E538 Deficiency of other specified B group vitamins: Secondary | ICD-10-CM | POA: Diagnosis not present

## 2019-09-25 DIAGNOSIS — D509 Iron deficiency anemia, unspecified: Secondary | ICD-10-CM | POA: Diagnosis not present

## 2019-09-25 NOTE — Progress Notes (Signed)
[place], [place] [address]   CLINIC:  Medical Oncology/Hematology  PCP:  Celene Squibb, MD [address] [place] [address] [phone]   REASON FOR VISIT: Follow-up for iron deficiency anemia  CURRENT THERAPY: Intermittent iron infusions   INTERVAL HISTORY:  [name] 85 y.o. female returns for routine follow-up for iron deficiency anemia.  Patient reports she has been doing well since her last visit.  She denies any bright red bleeding per rectum or melena.  She denies any easy bruising.  She denies any extreme fatigue.  She does have bilateral lower leg swelling that she sees her PCP for. Denies any nausea, vomiting, or diarrhea. Denies any new pains. Had not noticed any recent bleeding such as epistaxis, hematuria or hematochezia. Denies recent chest pain on exertion, shortness of breath on minimal exertion, pre-syncopal episodes, or palpitations. Denies any numbness or tingling in hands or feet. Denies any recent fevers, infections, or recent hospitalizations. Patient reports appetite at 25% and energy level at 25%.  She has maintain her weight at this time.     REVIEW OF SYSTEMS:  Review of Systems  Cardiovascular: Positive for leg swelling.  Neurological: Positive for numbness.  All other systems reviewed and are negative.    PAST MEDICAL/SURGICAL HISTORY:  Past Medical History:  Diagnosis Date  . Anemia   . Chronic bronchitis ([place])   . GAVE (gastric antral vascular ectasia) 09/12/12  . Hypertension   . Hypothyroidism   . Iron deficiency anemia 10/15/2012  . Iron deficiency anemia due to chronic blood loss 04/20/2014  . On home oxygen therapy    "2L/[place] at night" (11/07/2016)  . Palpitations   . Pneumonia 06/2016; 10/2016  . Syncope and collapse 11/06/2016   Past Surgical History:  Procedure Laterality Date  . BACK SURGERY    . BIOPSY  12/27/2016   Procedure: BIOPSY;  Surgeon: Rogene Houston, MD;  [facility];  Service: Endoscopy;;  gastric  . COLONOSCOPY  07/24/2012   Procedure: COLONOSCOPY;  Surgeon: Rogene Houston, MD;  [facility];  Service: Endoscopy;  Laterality: N/A;  200  . DILATION AND CURETTAGE OF UTERUS    . ESOPHAGOGASTRODUODENOSCOPY  09/12/2012   Procedure: ESOPHAGOGASTRODUODENOSCOPY (EGD);  Surgeon: Rogene Houston, MD;  [facility];  Service: Endoscopy;  Laterality: N/A;  325-changed to 200 per Ann-pt moved up to [place] to notify pt  . ESOPHAGOGASTRODUODENOSCOPY  11/01/2012   Procedure: ESOPHAGOGASTRODUODENOSCOPY (EGD);  Surgeon: Rogene Houston, MD;  [facility];  Service: Endoscopy;  Laterality: N/A;  1:20  . ESOPHAGOGASTRODUODENOSCOPY N/A 07/04/2013   Procedure: ESOPHAGOGASTRODUODENOSCOPY (EGD);  Surgeon: Rogene Houston, MD;  [facility];  Service: Endoscopy;  Laterality: N/A;  850  . ESOPHAGOGASTRODUODENOSCOPY N/A 08/06/2013   Procedure: ESOPHAGOGASTRODUODENOSCOPY (EGD);  Surgeon: Rogene Houston, MD;  [facility];  Service: Endoscopy;  Laterality: N/A;  1200  . ESOPHAGOGASTRODUODENOSCOPY N/A 10/31/2013   Procedure: ESOPHAGOGASTRODUODENOSCOPY (EGD);  Surgeon: Rogene Houston, MD;  [facility];  Service: Endoscopy;  Laterality: N/A;  125-moved to [place] notified pt  . ESOPHAGOGASTRODUODENOSCOPY N/A 10/23/2014   Procedure: ESOPHAGOGASTRODUODENOSCOPY (EGD);  Surgeon: Rogene Houston, MD;  [facility];  Service: Endoscopy;  Laterality: N/A;  1020  . ESOPHAGOGASTRODUODENOSCOPY N/A 10/11/2016   Procedure: ESOPHAGOGASTRODUODENOSCOPY (EGD);  Surgeon: Rogene Houston, MD;  [facility];  Service: Endoscopy;  Laterality:  N/A;  245  . ESOPHAGOGASTRODUODENOSCOPY N/A 12/27/2016   Procedure: ESOPHAGOGASTRODUODENOSCOPY (EGD);  Surgeon: Rogene Houston, MD;  [facility];  Service: Endoscopy;  Laterality: N/A;  1225  . HEEL SPUR SURGERY Bilateral   . HOT HEMOSTASIS  11/01/2012    Procedure: HOT HEMOSTASIS (ARGON PLASMA COAGULATION/BICAP);  Surgeon: Rogene Houston, MD;  [facility];  Service: Endoscopy;  Laterality: N/A;  . HOT HEMOSTASIS N/A 07/04/2013   Procedure: HOT HEMOSTASIS (ARGON PLASMA COAGULATION/BICAP);  Surgeon: Rogene Houston, MD;  [facility];  Service: Endoscopy;  Laterality: N/A;  . HOT HEMOSTASIS N/A 08/06/2013   Procedure: HOT HEMOSTASIS (ARGON PLASMA COAGULATION/BICAP);  Surgeon: Rogene Houston, MD;  [facility];  Service: Endoscopy;  Laterality: N/A;  . HOT HEMOSTASIS N/A 10/31/2013   Procedure: HOT HEMOSTASIS (ARGON PLASMA COAGULATION/BICAP);  Surgeon: Rogene Houston, MD;  [facility];  Service: Endoscopy;  Laterality: N/A;  . HOT HEMOSTASIS N/A 10/23/2014   Procedure: HOT HEMOSTASIS (ARGON PLASMA COAGULATION/BICAP);  Surgeon: Rogene Houston, MD;  [facility];  Service: Endoscopy;  Laterality: N/A;  . HOT HEMOSTASIS N/A 10/11/2016   Procedure: HOT HEMOSTASIS (ARGON PLASMA COAGULATION/BICAP);  Surgeon: Rogene Houston, MD;  [facility];  Service: Endoscopy;  Laterality: N/A;  . JOINT REPLACEMENT    . KNEE ARTHROSCOPY Right    "before replacement"  . LUMBAR DISC SURGERY     "bone spur  . LUMBAR FUSION  06/25/2014   L4 & L5  . SHOULDER SURGERY Right    "spur"  . TONSILLECTOMY AND ADENOIDECTOMY    . TOTAL ABDOMINAL HYSTERECTOMY    . TOTAL KNEE ARTHROPLASTY Right   . US ECHOCARDIOGRAPHY  03/12/2006   EF 55-60%     SOCIAL HISTORY:  Social History   Socioeconomic History  . Marital status: Widowed    Spouse name: Not on file  . Number of children: Not on file  . Years of education: Not on file  . Highest education level: Not on file  Occupational History  . Not on file  Social Needs  . Financial resource strain: Not on file  . Food insecurity    Worry: Not on file    Inability: Not on file  . Transportation needs    Medical: Not on file    Non-medical: Not on  file  Tobacco Use  . Smoking status: Never Smoker  . Smokeless tobacco: Never Used  Substance and Sexual Activity  . Alcohol use: No  . Drug use: No  . Sexual activity: Never  Lifestyle  . Physical activity    Days per week: Not on file    Minutes per session: Not on file  . Stress: Not on file  Relationships  . Social Herbalist on phone: Not on file    Gets together: Not on file    Attends religious service: Not on file    Active member of club or organization: Not on file    Attends meetings of clubs or organizations: Not on file    Relationship status: Not on file  . Intimate partner violence    Fear of current or ex partner: Not on file    Emotionally abused: Not on file    Physically abused: Not on file    Forced sexual activity: Not on file  Other Topics Concern  . Not on file  Social History Narrative  . Not on file  FAMILY HISTORY:  Family History  Problem Relation Age of Onset  . Hypertension Mother   . Hypertension Father   . Heart attack Father   . Other Sister        aneurysm on heart  . Other Brother        aneurysm on heart  . Breast cancer Neg Hx     CURRENT MEDICATIONS:  Outpatient Encounter Medications as of 09/25/2019  Medication Sig Note  . albuterol (PROVENTIL) (2.5 MG/3ML) 0.083% nebulizer solution Take 2.5 mg by nebulization every 6 (six) hours as needed for wheezing or shortness of breath.   . cyanocobalamin 1000 MCG tablet Take 1,000 mcg by mouth daily.   . digoxin (DIGOX) 0.125 MG tablet Take 1 tablet mouth daily except none on Sundays or as directed   . feeding supplement, ENSURE ENLIVE, (ENSURE ENLIVE) LIQD Take 237 mLs by mouth 2 (two) times daily between meals. (Patient taking differently: Take 237 mLs by mouth 3 (three) times daily between meals. )   . furosemide (LASIX) 20 MG tablet Take 1 tablet (20 mg total) by mouth daily. SCHEDULE OV FOR FURTHER REFILLS. 2nd Attempt. (Patient taking differently: Take 20 mg by mouth  daily. Pt takes on Sun, Tues, Thurs, Sat only) 08/12/2019: Patient takes on Sunday , Monday , Tuesday , Thursday and Saturday.  . hydrALAZINE (APRESOLINE) 10 MG tablet Take 1 tablet (10 mg total) by mouth 2 (two) times daily.   Marland Kitchen levothyroxine (SYNTHROID, LEVOTHROID) 112 MCG tablet Take 112 mcg by mouth daily before breakfast.   . linaclotide (LINZESS) 72 MCG capsule Take 72 mcg by mouth daily before breakfast.   . metoprolol tartrate (LOPRESSOR) 25 MG tablet TAKE 1 TABLET(25 MG) BY MOUTH TWICE DAILY   . mirtazapine (REMERON) 30 MG tablet Take 30 mg by mouth at bedtime.   . pantoprazole (PROTONIX) 40 MG tablet TK 1 T PO D   . [DISCONTINUED] mirtazapine (REMERON) 15 MG tablet Take 30 mg by mouth at bedtime.     Facility-Administered Encounter Medications as of 09/25/2019  Medication  . 0.9 %  sodium chloride infusion    ALLERGIES:  Allergies  Allergen Reactions  . Flagyl [Metronidazole] Diarrhea, Nausea Only and Other (See Comments)    Dizziness and feeling of inbalance  . Avapro [Irbesartan] Other (See Comments)    Cough   . Clarithromycin Other (See Comments)    Unknown   . Dexamethasone     UPSET STOMACH  . Losartan Potassium-Hctz Other (See Comments)    Unknown  . Maxzide [Hydrochlorothiazide W-Triamterene] Other (See Comments)    Unknown   . Ziac [Bisoprolol-Hydrochlorothiazide]     Thinks caused itching and cough 08/12/12  . Penicillins Swelling and Rash    Has patient had a PCN reaction causing immediate rash, facial/tongue/throat swelling, SOB or lightheadedness with hypotension: no Has patient had a PCN reaction causing severe rash involving mucus membranes or skin necrosis: no Has patient had a PCN reaction that required hospitalization {no Has patient had a PCN reaction occurring within the last 10 years: {no If all of the above answers are "NO", then may proceed with Cephalosporin use.  . Sulfa Drugs Cross Reactors Rash     PHYSICAL EXAM:  ECOG Performance status: 1   Vitals:   09/25/19 1305  BP: (!) 108/53  Pulse: (!) 55  Resp: (!) 28  Temp: (!) 97.3 F (36.3 C)  SpO2: 94%   Filed Weights   09/25/19 1305  Weight: 110 lb 14.4 oz (50.3  kg)    Physical Exam Constitutional:      Appearance: Normal appearance. She is normal weight.  Cardiovascular:     Rate and Rhythm: Normal rate and regular rhythm.     Heart sounds: Normal heart sounds.  Pulmonary:     Effort: Pulmonary effort is normal.     Breath sounds: Normal breath sounds.  Abdominal:     General: Bowel sounds are normal.     Palpations: Abdomen is soft.  Musculoskeletal: Normal range of motion.  Skin:    General: Skin is warm.  Neurological:     Mental Status: She is alert and oriented to person, place, and time. Mental status is at baseline.  Psychiatric:        Mood and Affect: Mood normal.        Behavior: Behavior normal.        Thought Content: Thought content normal.        Judgment: Judgment normal.      LABORATORY DATA:  I have reviewed the labs as listed.  CBC    Component Value Date/Time   WBC 8.3 09/18/2019 1050   RBC 4.05 09/18/2019 1050   HGB 13.4 09/18/2019 1050   HCT 44.1 09/18/2019 1050   PLT 246 09/18/2019 1050   MCV 108.9 (H) 09/18/2019 1050   MCH 33.1 09/18/2019 1050   MCHC 30.4 09/18/2019 1050   RDW 13.2 09/18/2019 1050   LYMPHSABS 1.0 09/18/2019 1050   MONOABS 0.9 09/18/2019 1050   EOSABS 0.1 09/18/2019 1050   BASOSABS 0.1 09/18/2019 1050   CMP Latest Ref Rng & Units 09/18/2019 06/11/2019 12/11/2018  Glucose 70 - 99 mg/dL 123(H) 94 94  BUN 8 - 23 mg/dL 39(H) 39(H) 35(H)  Creatinine 0.44 - 1.00 mg/dL 0.94 0.97 1.03(H)  Sodium 135 - 145 mmol/L 143 141 136  Potassium 3.5 - 5.1 mmol/L 4.3 4.5 4.3  Chloride 98 - 111 mmol/L 95(L) 95(L) 93(L)  CO2 22 - 32 mmol/L 38(H) 37(H) 35(H)  Calcium 8.9 - 10.3 mg/dL 9.5 9.3 9.3  Total Protein 6.5 - 8.1 g/dL 7.0 7.1 6.8  Total Bilirubin 0.3 - 1.2 mg/dL 0.3 0.7 0.5  Alkaline Phos 38 - 126 U/L 85 78 66   AST 15 - 41 U/L 27 30 30   ALT 0 - 44 U/L 22 24 26       I personally performed a face-to-face visit.  All questions were answered to patient's stated satisfaction. Encouraged patient to call with any new concerns or questions before his next visit to the cancer center and we can certain see him sooner, if needed.     ASSESSMENT & PLAN:   Iron deficiency anemia due to chronic blood loss 1.  Iron deficiency anemia: - History of gastric antral vascular ectasia, last EGD on 12/27/2016 showing normal esophagus, moderate GAVE with bleeding in the antrum and prepyloric region, status post APC. -She receives Feraheme every 12 weeks for maintenance.  Last infusion was on 06/20/2019. -Patient denies any bright red bleeding per rectum or melena.  She reports her energy is still holding pretty well throughout the day. - Labs on 09/18/2019 showed her hemoglobin 13.4, ferritin 73, percent saturation 29, platelets 246, LDH 157 -We will hold off on any IV iron at this time. - Patient will follow-up in 3 months with repeat labs  2.  Vitamin B12 deficiency: -Patient was on oral B12 daily. -Labs on 09/18/2019 showed her vitamin B12 at 2110. -Patient did not wish to come off her B12  so we told her to take it once weekly -We will recheck at her next visit.      Orders placed this encounter:  Orders Placed This Encounter  Procedures  . Lactate dehydrogenase  . CBC with Differential/Platelet  . Comprehensive metabolic panel  . Ferritin  . Iron and TIBC  . Vitamin B12  . Vitamin D 25 hydroxy  . Folate      Francene Finders, FNP-C [place] [phone]

## 2019-09-25 NOTE — Assessment & Plan Note (Addendum)
1.  Iron deficiency anemia: - History of gastric antral vascular ectasia, last EGD on 12/27/2016 showing normal esophagus, moderate GAVE with bleeding in the antrum and prepyloric region, status post APC. -She receives Feraheme every 12 weeks for maintenance.  Last infusion was on 06/20/2019. -Patient denies any bright red bleeding per rectum or melena.  She reports her energy is still holding pretty well throughout the day. - Labs on 09/18/2019 showed her hemoglobin 13.4, ferritin 73, percent saturation 29, platelets 246, LDH 157 -We will hold off on any IV iron at this time. - Patient will follow-up in 3 months with repeat labs  2.  Vitamin B12 deficiency: -Patient was on oral B12 daily. -Labs on 09/18/2019 showed her vitamin B12 at 2110. -Patient did not wish to come off her B12 so we told her to take it once weekly -We will recheck at her next visit.

## 2019-09-25 NOTE — Patient Instructions (Addendum)
[facility] at [facility]  Discharge Instructions: Follow up in 3 months with labs   You saw Randi Lockamy, NP, today. _______________________________________________________________  Thank you for choosing [facility] at [facility] to provide your oncology and hematology care.  To afford each patient quality time with our providers, please arrive at least 15 minutes before your scheduled appointment.  You need to re-schedule your appointment if you arrive 10 or more minutes late.  We strive to give you quality time with our providers, and arriving late affects you and other patients whose appointments are after yours.  Also, if you no show three or more times for appointments you may be dismissed from the clinic.  Again, thank you for choosing  [facility] Center at [facility]. Our hope is that these requests will allow you access to exceptional care and in a timely manner. _______________________________________________________________  If you have questions after your visit, please contact our office at [phone] between the hours of 8:30 a.m. and 5:00 p.m. Voicemails left after 4:30 p.m. will not be returned until the following business day. _______________________________________________________________  For prescription refill requests, have your pharmacy contact our office. _______________________________________________________________  Recommendations made by the consultant and any test results will be sent to your referring physician. _______________________________________________________________ 

## 2019-10-08 ENCOUNTER — Other Ambulatory Visit: Payer: Self-pay

## 2019-11-20 DIAGNOSIS — I5022 Chronic systolic (congestive) heart failure: Secondary | ICD-10-CM | POA: Diagnosis not present

## 2019-11-20 DIAGNOSIS — E860 Dehydration: Secondary | ICD-10-CM | POA: Diagnosis not present

## 2019-11-20 DIAGNOSIS — N390 Urinary tract infection, site not specified: Secondary | ICD-10-CM | POA: Diagnosis not present

## 2019-11-20 DIAGNOSIS — R42 Dizziness and giddiness: Secondary | ICD-10-CM | POA: Diagnosis not present

## 2019-12-23 ENCOUNTER — Other Ambulatory Visit: Payer: Self-pay

## 2019-12-23 ENCOUNTER — Inpatient Hospital Stay (HOSPITAL_COMMUNITY): Payer: Medicare Other | Attending: Hematology

## 2019-12-23 DIAGNOSIS — E538 Deficiency of other specified B group vitamins: Secondary | ICD-10-CM | POA: Insufficient documentation

## 2019-12-23 DIAGNOSIS — D500 Iron deficiency anemia secondary to blood loss (chronic): Secondary | ICD-10-CM

## 2019-12-23 DIAGNOSIS — K31819 Angiodysplasia of stomach and duodenum without bleeding: Secondary | ICD-10-CM | POA: Diagnosis not present

## 2019-12-23 LAB — CBC WITH DIFFERENTIAL/PLATELET
Abs Immature Granulocytes: 0.02 10*3/uL (ref 0.00–0.07)
Basophils Absolute: 0.1 10*3/uL (ref 0.0–0.1)
Basophils Relative: 1 %
Eosinophils Absolute: 0.4 10*3/uL (ref 0.0–0.5)
Eosinophils Relative: 5 %
HCT: 41 % (ref 36.0–46.0)
Hemoglobin: 12.3 g/dL (ref 12.0–15.0)
Immature Granulocytes: 0 %
Lymphocytes Relative: 15 %
Lymphs Abs: 1.1 10*3/uL (ref 0.7–4.0)
MCH: 32.3 pg (ref 26.0–34.0)
MCHC: 30 g/dL (ref 30.0–36.0)
MCV: 107.6 fL — ABNORMAL HIGH (ref 80.0–100.0)
Monocytes Absolute: 0.9 10*3/uL (ref 0.1–1.0)
Monocytes Relative: 12 %
Neutro Abs: 4.8 10*3/uL (ref 1.7–7.7)
Neutrophils Relative %: 67 %
Platelets: 258 10*3/uL (ref 150–400)
RBC: 3.81 MIL/uL — ABNORMAL LOW (ref 3.87–5.11)
RDW: 13.2 % (ref 11.5–15.5)
WBC: 7.3 10*3/uL (ref 4.0–10.5)
nRBC: 0 % (ref 0.0–0.2)

## 2019-12-23 LAB — COMPREHENSIVE METABOLIC PANEL
ALT: 17 U/L (ref 0–44)
AST: 23 U/L (ref 15–41)
Albumin: 3.5 g/dL (ref 3.5–5.0)
Alkaline Phosphatase: 85 U/L (ref 38–126)
Anion gap: 11 (ref 5–15)
BUN: 39 mg/dL — ABNORMAL HIGH (ref 8–23)
CO2: 38 mmol/L — ABNORMAL HIGH (ref 22–32)
Calcium: 9.7 mg/dL (ref 8.9–10.3)
Chloride: 92 mmol/L — ABNORMAL LOW (ref 98–111)
Creatinine, Ser: 1.05 mg/dL — ABNORMAL HIGH (ref 0.44–1.00)
GFR calc Af Amer: 56 mL/min — ABNORMAL LOW (ref >60)
GFR calc non Af Amer: 49 mL/min — ABNORMAL LOW (ref >60)
Glucose, Bld: 103 mg/dL — ABNORMAL HIGH (ref 70–99)
Potassium: 5.1 mmol/L (ref 3.5–5.1)
Sodium: 141 mmol/L (ref 135–145)
Total Bilirubin: 0.5 mg/dL (ref 0.3–1.2)
Total Protein: 7.2 g/dL (ref 6.5–8.1)

## 2019-12-23 LAB — IRON AND TIBC
Iron: 43 ug/dL (ref 28–170)
Saturation Ratios: 12 % (ref 10.4–31.8)
TIBC: 369 ug/dL (ref 250–450)
UIBC: 326 ug/dL

## 2019-12-23 LAB — FOLATE: Folate: 22.4 ng/mL (ref >5.9)

## 2019-12-23 LAB — VITAMIN B12: Vitamin B-12: 802 pg/mL (ref 180–914)

## 2019-12-23 LAB — FERRITIN: Ferritin: 39 ng/mL (ref 11–307)

## 2019-12-23 LAB — VITAMIN D 25 HYDROXY (VIT D DEFICIENCY, FRACTURES): Vit D, 25-Hydroxy: 62.13 ng/mL (ref 30–100)

## 2019-12-23 LAB — LACTATE DEHYDROGENASE: LDH: 128 U/L (ref 98–192)

## 2019-12-30 ENCOUNTER — Encounter (HOSPITAL_COMMUNITY): Payer: Self-pay | Admitting: Nurse Practitioner

## 2019-12-30 ENCOUNTER — Other Ambulatory Visit: Payer: Self-pay

## 2019-12-30 ENCOUNTER — Inpatient Hospital Stay (HOSPITAL_BASED_OUTPATIENT_CLINIC_OR_DEPARTMENT_OTHER): Payer: Medicare Other | Admitting: Nurse Practitioner

## 2019-12-30 DIAGNOSIS — D500 Iron deficiency anemia secondary to blood loss (chronic): Secondary | ICD-10-CM | POA: Diagnosis not present

## 2019-12-30 DIAGNOSIS — E538 Deficiency of other specified B group vitamins: Secondary | ICD-10-CM | POA: Diagnosis not present

## 2019-12-30 DIAGNOSIS — K31819 Angiodysplasia of stomach and duodenum without bleeding: Secondary | ICD-10-CM | POA: Diagnosis not present

## 2019-12-30 NOTE — Progress Notes (Signed)
[name], [name] [id number]   CLINIC:  Medical Oncology/Hematology  PCP:  Celene Squibb, MD [address] [name] [address] [phone]   REASON FOR VISIT: Follow-up for iron deficiency anemia  CURRENT THERAPY: Intermittent iron infusions   INTERVAL HISTORY:  Mary [name] 86 y.o. female returns for routine follow-up for iron deficiency anemia.  Patient reports that she has been doing well.  She reports her energy levels are still maintaining about the same.  She denies any bright red bleeding per rectum or melena.  She denies any easy bruising or bleeding. Denies any nausea, vomiting, or diarrhea. Denies any new pains. Had not noticed any recent bleeding such as epistaxis, hematuria or hematochezia. Denies recent chest pain on exertion, shortness of breath on minimal exertion, pre-syncopal episodes, or palpitations. Denies any numbness or tingling in hands or feet. Denies any recent fevers, infections, or recent hospitalizations. Patient reports appetite at 100% and energy level at 50%.  She is eating well maintain her weight at this time.    REVIEW OF SYSTEMS:  Review of Systems  All other systems reviewed and are negative.    PAST MEDICAL/SURGICAL HISTORY:  Past Medical History:  Diagnosis Date  . Anemia   . Chronic bronchitis ([place])   . GAVE (gastric antral vascular ectasia) 09/12/12  . Hypertension   . Hypothyroidism   . Iron deficiency anemia 10/15/2012  . Iron deficiency anemia due to chronic blood loss 04/20/2014  . On home oxygen therapy    "2L/[place] at night" (11/07/2016)  . Palpitations   . Pneumonia 06/2016; 10/2016  . Syncope and collapse 11/06/2016   Past Surgical History:  Procedure Laterality Date  . BACK SURGERY    . BIOPSY  12/27/2016   Procedure: BIOPSY;  Surgeon: Rogene Houston, MD;  [facility];  Service: Endoscopy;;  gastric  . COLONOSCOPY  07/24/2012   Procedure: COLONOSCOPY;  Surgeon: Rogene Houston, MD;  [facility];  Service: Endoscopy;  Laterality: N/A;  200  . DILATION AND CURETTAGE OF UTERUS    . ESOPHAGOGASTRODUODENOSCOPY  09/12/2012   Procedure: ESOPHAGOGASTRODUODENOSCOPY (EGD);  Surgeon: Rogene Houston, MD;  [facility];  Service: Endoscopy;  Laterality: N/A;  325-changed to 200 per Ann-pt moved up to [place] to notify pt  . ESOPHAGOGASTRODUODENOSCOPY  11/01/2012   Procedure: ESOPHAGOGASTRODUODENOSCOPY (EGD);  Surgeon: Rogene Houston, MD;  [facility];  Service: Endoscopy;  Laterality: N/A;  1:20  . ESOPHAGOGASTRODUODENOSCOPY N/A 07/04/2013   Procedure: ESOPHAGOGASTRODUODENOSCOPY (EGD);  Surgeon: Rogene Houston, MD;  [facility];  Service: Endoscopy;  Laterality: N/A;  850  . ESOPHAGOGASTRODUODENOSCOPY N/A 08/06/2013   Procedure: ESOPHAGOGASTRODUODENOSCOPY (EGD);  Surgeon: Rogene Houston, MD;  [facility];  Service: Endoscopy;  Laterality: N/A;  1200  . ESOPHAGOGASTRODUODENOSCOPY N/A 10/31/2013   Procedure: ESOPHAGOGASTRODUODENOSCOPY (EGD);  Surgeon: Rogene Houston, MD;  [facility];  Service: Endoscopy;  Laterality: N/A;  125-moved to [place] notified pt  . ESOPHAGOGASTRODUODENOSCOPY N/A 10/23/2014   Procedure: ESOPHAGOGASTRODUODENOSCOPY (EGD);  Surgeon: Rogene Houston, MD;  [facility];  Service: Endoscopy;  Laterality: N/A;  1020  . ESOPHAGOGASTRODUODENOSCOPY N/A 10/11/2016   Procedure: ESOPHAGOGASTRODUODENOSCOPY (EGD);  Surgeon: Rogene Houston, MD;  [facility];  Service: Endoscopy;  Laterality: N/A;  245  . ESOPHAGOGASTRODUODENOSCOPY N/A 12/27/2016   Procedure: ESOPHAGOGASTRODUODENOSCOPY (EGD);  Surgeon: Rogene Houston, MD;  [facility];  Service: Endoscopy;  Laterality: N/A;  1225  . HEEL SPUR SURGERY Bilateral   . HOT HEMOSTASIS  11/01/2012   Procedure: HOT HEMOSTASIS (ARGON PLASMA COAGULATION/BICAP);  Surgeon: Rogene Houston, MD;  [facility];   Service: Endoscopy;  Laterality: N/A;  . HOT HEMOSTASIS N/A 07/04/2013   Procedure: HOT HEMOSTASIS (ARGON PLASMA COAGULATION/BICAP);  Surgeon: Rogene Houston, MD;  [facility];  Service: Endoscopy;  Laterality: N/A;  . HOT HEMOSTASIS N/A 08/06/2013   Procedure: HOT HEMOSTASIS (ARGON PLASMA COAGULATION/BICAP);  Surgeon: Rogene Houston, MD;  [facility];  Service: Endoscopy;  Laterality: N/A;  . HOT HEMOSTASIS N/A 10/31/2013   Procedure: HOT HEMOSTASIS (ARGON PLASMA COAGULATION/BICAP);  Surgeon: Rogene Houston, MD;  [facility];  Service: Endoscopy;  Laterality: N/A;  . HOT HEMOSTASIS N/A 10/23/2014   Procedure: HOT HEMOSTASIS (ARGON PLASMA COAGULATION/BICAP);  Surgeon: Rogene Houston, MD;  [facility];  Service: Endoscopy;  Laterality: N/A;  . HOT HEMOSTASIS N/A 10/11/2016   Procedure: HOT HEMOSTASIS (ARGON PLASMA COAGULATION/BICAP);  Surgeon: Rogene Houston, MD;  [facility];  Service: Endoscopy;  Laterality: N/A;  . JOINT REPLACEMENT    . KNEE ARTHROSCOPY Right    "before replacement"  . LUMBAR DISC SURGERY     "bone spur  . LUMBAR FUSION  06/25/2014   L4 & L5  . SHOULDER SURGERY Right    "spur"  . TONSILLECTOMY AND ADENOIDECTOMY    . TOTAL ABDOMINAL HYSTERECTOMY    . TOTAL KNEE ARTHROPLASTY Right   . US ECHOCARDIOGRAPHY  03/12/2006   EF 55-60%     SOCIAL HISTORY:  Social History   Socioeconomic History  . Marital status: Widowed    Spouse name: Not on file  . Number of children: Not on file  . Years of education: Not on file  . Highest education level: Not on file  Occupational History  . Not on file  Tobacco Use  . Smoking status: Never Smoker  . Smokeless tobacco: Never Used  Substance and Sexual Activity  . Alcohol use: No  . Drug use: No  . Sexual activity: Never  Other Topics Concern  . Not on file  Social History Narrative  . Not on file   Social Determinants of Health   Financial Resource  Strain:   . Difficulty of Paying Living Expenses: Not on file  Food Insecurity:   . Worried About Charity fundraiser in the Last Year: Not on file  . Ran Out of Food in the Last Year: Not on file  Transportation Needs:   . Lack of Transportation (Medical): Not on file  . Lack of Transportation (Non-Medical): Not on file  Physical Activity:   . Days of Exercise per Week: Not on file  . Minutes of Exercise per Session: Not on file  Stress:   . Feeling of Stress : Not on file  Social Connections:   . Frequency of Communication with Friends and Family: Not on file  . Frequency of Social Gatherings with Friends and Family: Not on file  . Attends Religious Services: Not on file  . Active Member of Clubs or Organizations: Not on file  . Attends Archivist Meetings: Not on file  . Marital Status: Not on file  Intimate Partner Violence:   . Fear of Current or Ex-Partner: Not on file  . Emotionally Abused: Not on file  . Physically Abused: Not  on file  . Sexually Abused: Not on file    FAMILY HISTORY:  Family History  Problem Relation Age of Onset  . Hypertension Mother   . Hypertension Father   . Heart attack Father   . Other Sister        aneurysm on heart  . Other Brother        aneurysm on heart  . Breast cancer Neg Hx     CURRENT MEDICATIONS:  Outpatient Encounter Medications as of 12/30/2019  Medication Sig Note  . cyanocobalamin 1000 MCG tablet Take 1,000 mcg by mouth once a week.    . digoxin (DIGOX) 0.125 MG tablet Take 1 tablet mouth daily except none on Sundays or as directed   . feeding supplement, ENSURE ENLIVE, (ENSURE ENLIVE) LIQD Take 237 mLs by mouth 2 (two) times daily between meals. (Patient taking differently: Take 237 mLs by mouth 3 (three) times daily between meals. )   . furosemide (LASIX) 20 MG tablet Take 1 tablet (20 mg total) by mouth daily. SCHEDULE OV FOR FURTHER REFILLS. 2nd Attempt. (Patient taking differently: Take 20 mg by mouth daily. Pt  takes on Sun, Tues, Thurs, Sat only) 08/12/2019: Patient takes on Sunday , Monday , Tuesday , Thursday and Saturday.  . hydrALAZINE (APRESOLINE) 10 MG tablet Take 1 tablet (10 mg total) by mouth 2 (two) times daily.   Marland Kitchen levothyroxine (SYNTHROID, LEVOTHROID) 112 MCG tablet Take 112 mcg by mouth daily before breakfast.   . linaclotide (LINZESS) 72 MCG capsule Take 72 mcg by mouth daily before breakfast.   . metoprolol tartrate (LOPRESSOR) 25 MG tablet TAKE 1 TABLET(25 MG) BY MOUTH TWICE DAILY   . mirtazapine (REMERON) 30 MG tablet Take 30 mg by mouth at bedtime.   . pantoprazole (PROTONIX) 40 MG tablet TK 1 T PO D   . albuterol (PROVENTIL) (2.5 MG/3ML) 0.083% nebulizer solution Take 2.5 mg by nebulization every 6 (six) hours as needed for wheezing or shortness of breath.    Facility-Administered Encounter Medications as of 12/30/2019  Medication  . 0.9 %  sodium chloride infusion    ALLERGIES:  Allergies  Allergen Reactions  . Flagyl [Metronidazole] Diarrhea, Nausea Only and Other (See Comments)    Dizziness and feeling of inbalance  . Avapro [Irbesartan] Other (See Comments)    Cough   . Clarithromycin Other (See Comments)    Unknown   . Dexamethasone     UPSET STOMACH  . Losartan Potassium-Hctz Other (See Comments)    Unknown  . Maxzide [Hydrochlorothiazide W-Triamterene] Other (See Comments)    Unknown   . Ziac [Bisoprolol-Hydrochlorothiazide]     Thinks caused itching and cough 08/12/12  . Penicillins Swelling and Rash    Has patient had a PCN reaction causing immediate rash, facial/tongue/throat swelling, SOB or lightheadedness with hypotension: no Has patient had a PCN reaction causing severe rash involving mucus membranes or skin necrosis: no Has patient had a PCN reaction that required hospitalization {no Has patient had a PCN reaction occurring within the last 10 years: {no If all of the above answers are "NO", then may proceed with Cephalosporin use.  . Sulfa Drugs Cross  Reactors Rash     PHYSICAL EXAM:  ECOG Performance status: 1  Vitals:   12/30/19 1431  BP: (!) 134/56  Pulse: 89  Resp: 20  Temp: (!) 96.8 F (36 C)  SpO2: 92%   Filed Weights    Physical Exam Constitutional:      Appearance: Normal appearance. She  is normal weight.  Cardiovascular:     Rate and Rhythm: Normal rate and regular rhythm.     Heart sounds: Normal heart sounds.  Pulmonary:     Effort: Pulmonary effort is normal.     Breath sounds: Normal breath sounds.  Abdominal:     General: Bowel sounds are normal.     Palpations: Abdomen is soft.  Musculoskeletal:        General: Normal range of motion.  Skin:    General: Skin is warm.  Neurological:     Mental Status: She is alert and oriented to person, place, and time. Mental status is at baseline.  Psychiatric:        Mood and Affect: Mood normal.        Behavior: Behavior normal.        Thought Content: Thought content normal.        Judgment: Judgment normal.      LABORATORY DATA:  I have reviewed the labs as listed.  CBC    Component Value Date/Time   WBC 7.3 12/23/2019 1426   RBC 3.81 (L) 12/23/2019 1426   HGB 12.3 12/23/2019 1426   HCT 41.0 12/23/2019 1426   PLT 258 12/23/2019 1426   MCV 107.6 (H) 12/23/2019 1426   MCH 32.3 12/23/2019 1426   MCHC 30.0 12/23/2019 1426   RDW 13.2 12/23/2019 1426   LYMPHSABS 1.1 12/23/2019 1426   MONOABS 0.9 12/23/2019 1426   EOSABS 0.4 12/23/2019 1426   BASOSABS 0.1 12/23/2019 1426   CMP Latest Ref Rng & Units 12/23/2019 09/18/2019 06/11/2019  Glucose 70 - 99 mg/dL 103(H) 123(H) 94  BUN 8 - 23 mg/dL 39(H) 39(H) 39(H)  Creatinine 0.44 - 1.00 mg/dL 1.05(H) 0.94 0.97  Sodium 135 - 145 mmol/L 141 143 141  Potassium 3.5 - 5.1 mmol/L 5.1 4.3 4.5  Chloride 98 - 111 mmol/L 92(L) 95(L) 95(L)  CO2 22 - 32 mmol/L 38(H) 38(H) 37(H)  Calcium 8.9 - 10.3 mg/dL 9.7 9.5 9.3  Total Protein 6.5 - 8.1 g/dL 7.2 7.0 7.1  Total Bilirubin 0.3 - 1.2 mg/dL 0.5 0.3 0.7  Alkaline  Phos 38 - 126 U/L 85 85 78  AST 15 - 41 U/L 23 27 30   ALT 0 - 44 U/L 17 22 24      I personally performed a face-to-face visit.  All questions were answered to patient's stated satisfaction. Encouraged patient to call with any new concerns or questions before his next visit to the cancer center and we can certain see him sooner, if needed.     ASSESSMENT & PLAN:   Iron deficiency anemia due to chronic blood loss 1.  Iron deficiency anemia: - History of gastric antral vascular ectasia, last EGD on 12/27/2016 showing normal esophagus, moderate GAVE with bleeding in the antrum and prepyloric region, status post APC. -Last iron infusion was on 06/20/2019. -Patient denies any bright red bleeding per rectum or melena.  She reports her energy is still holding pretty well throughout the day. - Labs on 12/23/2019 showed her hemoglobin 12.3, ferritin 39, percent saturation 12, platelets 258, LDH 128 -We will hold off on any IV iron at this time. - Patient will follow-up in 5 months with repeat labs  2.  Vitamin B12 deficiency: -Patient was on oral B12 daily. -Labs on 09/18/2019 showed her vitamin B12 at 2110. -Patient did not wish to come off her B12 so we told her to take it once weekly -Labs done on 12/23/2019 showed her vitamin  B12 level 802. -We will recheck at her next visit.      Orders placed this encounter:  Orders Placed This Encounter  Procedures  . Lactate dehydrogenase  . CBC with Differential/Platelet  . Comprehensive metabolic panel  . Ferritin  . Iron and TIBC  . Vitamin B12  . VITAMIN D 25 Hydroxy (Vit-D Deficiency, Fractures)      Francene Finders, FNP-C [place] [phone]

## 2019-12-30 NOTE — Assessment & Plan Note (Addendum)
1.  Iron deficiency anemia: - History of gastric antral vascular ectasia, last EGD on 12/27/2016 showing normal esophagus, moderate GAVE with bleeding in the antrum and prepyloric region, status post APC. -Last iron infusion was on 06/20/2019. -Patient denies any bright red bleeding per rectum or melena.  She reports her energy is still holding pretty well throughout the day. - Labs on 12/23/2019 showed her hemoglobin 12.3, ferritin 39, percent saturation 12, platelets 258, LDH 128 -We will hold off on any IV iron at this time. - Patient will follow-up in 5 months with repeat labs  2.  Vitamin B12 deficiency: -Patient was on oral B12 daily. -Labs on 09/18/2019 showed her vitamin B12 at 2110. -Patient did not wish to come off her B12 so we told her to take it once weekly -Labs done on 12/23/2019 showed her vitamin B12 level 802. -We will recheck at her next visit.

## 2019-12-30 NOTE — Patient Instructions (Signed)
[facility] at [facility] Discharge Instructions  Follow up in 5 months with labs    Thank you for choosing [facility] at [facility] to provide your oncology and hematology care.  To afford each patient quality time with our provider, please arrive at least 15 minutes before your scheduled appointment time.   If you have a lab appointment with the Cancer Center please come in thru the Main Entrance and check in at the main information desk.  You need to re-schedule your appointment should you arrive 10 or more minutes late.  We strive to give you quality time with our providers, and arriving late affects you and other patients whose appointments are after yours.  Also, if you no show three or more times for appointments you may be dismissed from the clinic at the providers discretion.     Again, thank you for choosing [facility].  Our hope is that these requests will decrease the amount of time that you wait before being seen by our physicians.       _____________________________________________________________  Should you have questions after your visit to [facility], please contact our office at [phone] between the hours of 8:00 a.m. and 4:30 p.m.  Voicemails left after 4:00 p.m. will not be returned until the following business day.  For prescription refill requests, have your pharmacy contact our office and allow 72 hours.    Due to Covid, you will need to wear a mask upon entering the hospital. If you do not have a mask, a mask will be given to you at the Main Entrance upon arrival. For doctor visits, patients may have 1 support person with them. For treatment visits, patients can not have anyone with them due to social distancing guidelines and our immunocompromised population.      

## 2020-01-07 DIAGNOSIS — Z23 Encounter for immunization: Secondary | ICD-10-CM | POA: Diagnosis not present

## 2020-01-21 DIAGNOSIS — G64 Other disorders of peripheral nervous system: Secondary | ICD-10-CM | POA: Diagnosis not present

## 2020-01-21 DIAGNOSIS — E039 Hypothyroidism, unspecified: Secondary | ICD-10-CM | POA: Diagnosis not present

## 2020-01-21 DIAGNOSIS — I270 Primary pulmonary hypertension: Secondary | ICD-10-CM | POA: Diagnosis not present

## 2020-01-21 DIAGNOSIS — J188 Other pneumonia, unspecified organism: Secondary | ICD-10-CM | POA: Diagnosis not present

## 2020-01-21 DIAGNOSIS — K219 Gastro-esophageal reflux disease without esophagitis: Secondary | ICD-10-CM | POA: Diagnosis not present

## 2020-01-21 DIAGNOSIS — D509 Iron deficiency anemia, unspecified: Secondary | ICD-10-CM | POA: Diagnosis not present

## 2020-01-21 DIAGNOSIS — I499 Cardiac arrhythmia, unspecified: Secondary | ICD-10-CM | POA: Diagnosis not present

## 2020-01-21 DIAGNOSIS — F39 Unspecified mood [affective] disorder: Secondary | ICD-10-CM | POA: Diagnosis not present

## 2020-01-21 DIAGNOSIS — R0781 Pleurodynia: Secondary | ICD-10-CM | POA: Diagnosis not present

## 2020-01-21 DIAGNOSIS — Z8719 Personal history of other diseases of the digestive system: Secondary | ICD-10-CM | POA: Diagnosis not present

## 2020-01-21 DIAGNOSIS — F419 Anxiety disorder, unspecified: Secondary | ICD-10-CM | POA: Diagnosis not present

## 2020-01-21 DIAGNOSIS — I10 Essential (primary) hypertension: Secondary | ICD-10-CM | POA: Diagnosis not present

## 2020-01-28 DIAGNOSIS — E039 Hypothyroidism, unspecified: Secondary | ICD-10-CM | POA: Diagnosis not present

## 2020-01-28 DIAGNOSIS — G4700 Insomnia, unspecified: Secondary | ICD-10-CM | POA: Diagnosis not present

## 2020-01-28 DIAGNOSIS — I499 Cardiac arrhythmia, unspecified: Secondary | ICD-10-CM | POA: Diagnosis not present

## 2020-01-28 DIAGNOSIS — F419 Anxiety disorder, unspecified: Secondary | ICD-10-CM | POA: Diagnosis not present

## 2020-01-28 DIAGNOSIS — K219 Gastro-esophageal reflux disease without esophagitis: Secondary | ICD-10-CM | POA: Diagnosis not present

## 2020-01-28 DIAGNOSIS — I491 Atrial premature depolarization: Secondary | ICD-10-CM | POA: Diagnosis not present

## 2020-01-28 DIAGNOSIS — Z0001 Encounter for general adult medical examination with abnormal findings: Secondary | ICD-10-CM | POA: Diagnosis not present

## 2020-01-28 DIAGNOSIS — Z8719 Personal history of other diseases of the digestive system: Secondary | ICD-10-CM | POA: Diagnosis not present

## 2020-01-28 DIAGNOSIS — K5900 Constipation, unspecified: Secondary | ICD-10-CM | POA: Diagnosis not present

## 2020-01-28 DIAGNOSIS — I739 Peripheral vascular disease, unspecified: Secondary | ICD-10-CM | POA: Diagnosis not present

## 2020-01-28 DIAGNOSIS — K31819 Angiodysplasia of stomach and duodenum without bleeding: Secondary | ICD-10-CM | POA: Diagnosis not present

## 2020-01-28 DIAGNOSIS — I272 Pulmonary hypertension, unspecified: Secondary | ICD-10-CM | POA: Diagnosis not present

## 2020-01-28 DIAGNOSIS — I10 Essential (primary) hypertension: Secondary | ICD-10-CM | POA: Diagnosis not present

## 2020-01-28 DIAGNOSIS — F331 Major depressive disorder, recurrent, moderate: Secondary | ICD-10-CM | POA: Diagnosis not present

## 2020-01-28 DIAGNOSIS — D509 Iron deficiency anemia, unspecified: Secondary | ICD-10-CM | POA: Diagnosis not present

## 2020-01-28 DIAGNOSIS — I129 Hypertensive chronic kidney disease with stage 1 through stage 4 chronic kidney disease, or unspecified chronic kidney disease: Secondary | ICD-10-CM | POA: Diagnosis not present

## 2020-01-28 DIAGNOSIS — I5032 Chronic diastolic (congestive) heart failure: Secondary | ICD-10-CM | POA: Diagnosis not present

## 2020-02-04 DIAGNOSIS — Z23 Encounter for immunization: Secondary | ICD-10-CM | POA: Diagnosis not present

## 2020-02-10 ENCOUNTER — Other Ambulatory Visit: Payer: Self-pay

## 2020-02-10 ENCOUNTER — Ambulatory Visit (INDEPENDENT_AMBULATORY_CARE_PROVIDER_SITE_OTHER): Payer: Medicare Other | Admitting: Internal Medicine

## 2020-02-10 ENCOUNTER — Encounter (INDEPENDENT_AMBULATORY_CARE_PROVIDER_SITE_OTHER): Payer: Self-pay | Admitting: Internal Medicine

## 2020-02-10 VITALS — BP 153/70 | HR 76 | Temp 97.4°F | Ht 61.0 in | Wt 111.9 lb

## 2020-02-10 DIAGNOSIS — K31819 Angiodysplasia of stomach and duodenum without bleeding: Secondary | ICD-10-CM | POA: Diagnosis not present

## 2020-02-10 DIAGNOSIS — D500 Iron deficiency anemia secondary to blood loss (chronic): Secondary | ICD-10-CM

## 2020-02-10 NOTE — Progress Notes (Signed)
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  Patient was last seen 6 months ago. Patient denies melena or rectal bleeding.  She says she is having 1 formed stool daily.  She has not required iron infusion in a while.  She is getting blood work regularly through oncology clinic. Patient remains with poor appetite.  She only has lost 2 pounds since her last visit 6 months ago.  She denies nausea vomiting dysphagia or abdominal pain.  Her daughter states she is drinking 3 cans of Ensure every day.  As far as food is concerned she eats very little.  She is now on continuous nasal O2 at 1.5 L/min.  Previously she was using only at night.  She is now on palliative care.  She lives at home.  She is not able to do any household work.  She has 2 daughters and 2 sons. Corrine and her brother live close by and check on regularly.  She does spend some time alone.  Current Medications: Outpatient Encounter Medications as of 02/10/2020  Medication Sig  . albuterol (PROVENTIL) (2.5 MG/3ML) 0.083% nebulizer solution Take 2.5 mg by nebulization every 6 (six) hours as needed for wheezing or shortness of breath.  . cyanocobalamin 1000 MCG tablet Take 1,000 mcg by mouth once a week.   . digoxin (DIGOX) 0.125 MG tablet Take 1 tablet mouth daily except none on Sundays or as directed  . feeding supplement, ENSURE ENLIVE, (ENSURE ENLIVE) LIQD Take 237 mLs by mouth 2 (two) times daily between meals. (Patient taking differently: Take 237 mLs by mouth 3 (three) times daily between meals. )  . furosemide (LASIX) 20 MG tablet Take 1 tablet (20 mg total) by mouth daily. SCHEDULE OV FOR FURTHER REFILLS. 2nd Attempt. (Patient taking differently: Take 20 mg by mouth daily. Pt takes on Sun, Tues, Thurs, Sat only)  . hydrALAZINE (APRESOLINE) 10 MG  tablet Take 1 tablet (10 mg total) by mouth 2 (two) times daily.  Marland Kitchen levothyroxine (SYNTHROID, LEVOTHROID) 112 MCG tablet Take 112 mcg by mouth daily before breakfast.  . metoprolol tartrate (LOPRESSOR) 25 MG tablet TAKE 1 TABLET(25 MG) BY MOUTH TWICE DAILY  . mirtazapine (REMERON) 30 MG tablet Take 30 mg by mouth at bedtime.  . pantoprazole (PROTONIX) 40 MG tablet TK 1 T PO D  . [DISCONTINUED] linaclotide (LINZESS) 72 MCG capsule Take 72 mcg by mouth daily before breakfast.   Facility-Administered Encounter Medications as of 02/10/2020  Medication  . 0.9 %  sodium chloride infusion     Objective: Blood pressure (!) 153/70, pulse 76, temperature (!) 97.4 F (36.3 C), temperature source Temporal, height 5' 1"  (1.549 m), weight 111 lb 14.4 oz (50.8 kg). Patient appears to be comfortable sitting in a wheelchair. She is using nasal O2. She is wearing a facial mask. Conjunctiva is pink. Sclera is nonicteric Oropharyngeal mucosa is dry.  She has no dentures in place. No neck masses or thyromegaly noted. Cardiac exam with regular rhythm normal S1 and S2.  Faint systolic murmur noted at aortic area. Lungs are clear to auscultation. Abdomen is soft and nontender with organomegaly or masses. She does not have clubbing or koilonychia.  She has predominantly nonpitting edema to the legs.  Labs/studies Results:  CBC Latest Ref Rng & Units 12/23/2019 09/18/2019 06/11/2019  WBC 4.0 - 10.5 K/uL 7.3 8.3  7.4  Hemoglobin 12.0 - 15.0 g/dL 12.3 13.4 13.5  Hematocrit 36.0 - 46.0 % 41.0 44.1 43.9  Platelets 150 - 400 K/uL 258 246 219    CMP Latest Ref Rng & Units 12/23/2019 09/18/2019 06/11/2019  Glucose 70 - 99 mg/dL 103(H) 123(H) 94  BUN 8 - 23 mg/dL 39(H) 39(H) 39(H)  Creatinine 0.44 - 1.00 mg/dL 1.05(H) 0.94 0.97  Sodium 135 - 145 mmol/L 141 143 141  Potassium 3.5 - 5.1 mmol/L 5.1 4.3 4.5  Chloride 98 - 111 mmol/L 92(L) 95(L) 95(L)  CO2 22 - 32 mmol/L 38(H) 38(H) 37(H)  Calcium 8.9 - 10.3 mg/dL 9.7 9.5  9.3  Total Protein 6.5 - 8.1 g/dL 7.2 7.0 7.1  Total Bilirubin 0.3 - 1.2 mg/dL 0.5 0.3 0.7  Alkaline Phos 38 - 126 U/L 85 85 78  AST 15 - 41 U/L 23 27 30   ALT 0 - 44 U/L 17 22 24     Hepatic Function Latest Ref Rng & Units 12/23/2019 09/18/2019 06/11/2019  Total Protein 6.5 - 8.1 g/dL 7.2 7.0 7.1  Albumin 3.5 - 5.0 g/dL 3.5 3.4(L) 3.5  AST 15 - 41 U/L 23 27 30   ALT 0 - 44 U/L 17 22 24   Alk Phosphatase 38 - 126 U/L 85 85 78  Total Bilirubin 0.3 - 1.2 mg/dL 0.5 0.3 0.7  Bilirubin, Direct <=0.2 mg/dL - - -    Lab data from 12/23/2019 Serum iron 43, TIBC 369 and saturation 12% Serum ferritin 39 B12 level 802   Assessment:  #1.  Gastric antral vascular ectasia.  She has undergone APC therapy on multiple occasions possibly with marginal benefit.  Last such therapy was about 3 years ago.  She has never been confirmed to have cirrhosis.  Therefore gave it appears to be idiopathic.  #2.  History of iron deficiency anemia secondary to blood loss due to GAVE.  She is maintaining her H&H not requiring parenteral iron.  #3.  Weight loss.  Weight loss is secondary to anorexia.  It is unclear to me as to the cause of anorexia unless it was all due to depression.  Plan:  Patient will call if she experiences melena or rectal bleeding. Her daughter Corrine will check with PCP if she would be a candidate for medical stroller or other medications improve her appetite. Office visit on as-needed basis.

## 2020-02-10 NOTE — Patient Instructions (Signed)
Please notify if you experience tarry stools or melena.

## 2020-05-14 ENCOUNTER — Telehealth: Payer: Self-pay | Admitting: Cardiology

## 2020-05-14 NOTE — Telephone Encounter (Signed)
I attempted to contact patient 05/14/20 to schedule follow up visit from patients recall lists. The patient didn't answer, left message for patient to return call.

## 2020-05-25 DIAGNOSIS — E46 Unspecified protein-calorie malnutrition: Secondary | ICD-10-CM | POA: Diagnosis not present

## 2020-05-25 DIAGNOSIS — Z515 Encounter for palliative care: Secondary | ICD-10-CM | POA: Diagnosis not present

## 2020-05-25 DIAGNOSIS — F331 Major depressive disorder, recurrent, moderate: Secondary | ICD-10-CM | POA: Diagnosis not present

## 2020-06-01 ENCOUNTER — Other Ambulatory Visit: Payer: Self-pay

## 2020-06-01 ENCOUNTER — Other Ambulatory Visit (HOSPITAL_COMMUNITY): Payer: Medicare Other

## 2020-06-01 ENCOUNTER — Inpatient Hospital Stay (HOSPITAL_COMMUNITY): Payer: Medicare Other | Attending: Hematology

## 2020-06-01 DIAGNOSIS — D500 Iron deficiency anemia secondary to blood loss (chronic): Secondary | ICD-10-CM | POA: Insufficient documentation

## 2020-06-01 DIAGNOSIS — K31819 Angiodysplasia of stomach and duodenum without bleeding: Secondary | ICD-10-CM | POA: Diagnosis not present

## 2020-06-01 DIAGNOSIS — E538 Deficiency of other specified B group vitamins: Secondary | ICD-10-CM | POA: Insufficient documentation

## 2020-06-01 LAB — COMPREHENSIVE METABOLIC PANEL
ALT: 25 U/L (ref 0–44)
AST: 29 U/L (ref 15–41)
Albumin: 2.9 g/dL — ABNORMAL LOW (ref 3.5–5.0)
Alkaline Phosphatase: 89 U/L (ref 38–126)
Anion gap: 11 (ref 5–15)
BUN: 37 mg/dL — ABNORMAL HIGH (ref 8–23)
CO2: 40 mmol/L — ABNORMAL HIGH (ref 22–32)
Calcium: 9.2 mg/dL (ref 8.9–10.3)
Chloride: 89 mmol/L — ABNORMAL LOW (ref 98–111)
Creatinine, Ser: 0.96 mg/dL (ref 0.44–1.00)
GFR calc Af Amer: >60 mL/min (ref >60)
GFR calc non Af Amer: 54 mL/min — ABNORMAL LOW (ref >60)
Glucose, Bld: 101 mg/dL — ABNORMAL HIGH (ref 70–99)
Potassium: 3.9 mmol/L (ref 3.5–5.1)
Sodium: 140 mmol/L (ref 135–145)
Total Bilirubin: 0.7 mg/dL (ref 0.3–1.2)
Total Protein: 7.3 g/dL (ref 6.5–8.1)

## 2020-06-01 LAB — FERRITIN: Ferritin: 36 ng/mL (ref 11–307)

## 2020-06-01 LAB — CBC WITH DIFFERENTIAL/PLATELET
Abs Immature Granulocytes: 0.06 10*3/uL (ref 0.00–0.07)
Basophils Absolute: 0.1 10*3/uL (ref 0.0–0.1)
Basophils Relative: 1 %
Eosinophils Absolute: 0.1 10*3/uL (ref 0.0–0.5)
Eosinophils Relative: 1 %
HCT: 40.8 % (ref 36.0–46.0)
Hemoglobin: 12.2 g/dL (ref 12.0–15.0)
Immature Granulocytes: 1 %
Lymphocytes Relative: 5 %
Lymphs Abs: 0.6 10*3/uL — ABNORMAL LOW (ref 0.7–4.0)
MCH: 30.7 pg (ref 26.0–34.0)
MCHC: 29.9 g/dL — ABNORMAL LOW (ref 30.0–36.0)
MCV: 102.5 fL — ABNORMAL HIGH (ref 80.0–100.0)
Monocytes Absolute: 1.5 10*3/uL — ABNORMAL HIGH (ref 0.1–1.0)
Monocytes Relative: 14 %
Neutro Abs: 8.7 10*3/uL — ABNORMAL HIGH (ref 1.7–7.7)
Neutrophils Relative %: 78 %
Platelets: 361 10*3/uL (ref 150–400)
RBC: 3.98 MIL/uL (ref 3.87–5.11)
RDW: 13.4 % (ref 11.5–15.5)
WBC: 11 10*3/uL — ABNORMAL HIGH (ref 4.0–10.5)
nRBC: 0 % (ref 0.0–0.2)

## 2020-06-01 LAB — VITAMIN B12: Vitamin B-12: 951 pg/mL — ABNORMAL HIGH (ref 180–914)

## 2020-06-01 LAB — IRON AND TIBC
Iron: 29 ug/dL (ref 28–170)
Saturation Ratios: 9 % — ABNORMAL LOW (ref 10.4–31.8)
TIBC: 327 ug/dL (ref 250–450)
UIBC: 298 ug/dL

## 2020-06-01 LAB — VITAMIN D 25 HYDROXY (VIT D DEFICIENCY, FRACTURES): Vit D, 25-Hydroxy: 53.76 ng/mL (ref 30–100)

## 2020-06-01 LAB — LACTATE DEHYDROGENASE: LDH: 138 U/L (ref 98–192)

## 2020-06-08 ENCOUNTER — Inpatient Hospital Stay (HOSPITAL_BASED_OUTPATIENT_CLINIC_OR_DEPARTMENT_OTHER): Payer: Medicare Other | Admitting: Nurse Practitioner

## 2020-06-08 DIAGNOSIS — E538 Deficiency of other specified B group vitamins: Secondary | ICD-10-CM | POA: Diagnosis not present

## 2020-06-08 DIAGNOSIS — K31819 Angiodysplasia of stomach and duodenum without bleeding: Secondary | ICD-10-CM

## 2020-06-08 DIAGNOSIS — D500 Iron deficiency anemia secondary to blood loss (chronic): Secondary | ICD-10-CM

## 2020-06-08 NOTE — Progress Notes (Signed)
[place], [place] [address]   CLINIC:  Medical Oncology/Hematology  PCP:  Celene Squibb, MD [address] [place] Cro[place] [address] [phone]   REASON FOR VISIT: Follow-up for iron deficiency anemia   CURRENT THERAPY: Intermittent iron infusions  INTERVAL HISTORY:  Ms. Beshears 86 y.o. female returns for routine follow-up for iron deficiency anemia.  Patient reports she is a little bit fatigued and does not do much during the day.  She reports she thinks is her new norm.  She denies any bright red bleeding per rectum or melena.  She denies easy bruising or bleeding. Denies any nausea, vomiting, or diarrhea. Denies any new pains. Had not noticed any recent bleeding such as epistaxis, hematuria or hematochezia. Denies recent chest pain on exertion, shortness of breath on minimal exertion, pre-syncopal episodes, or palpitations. Denies any numbness or tingling in hands or feet. Denies any recent fevers, infections, or recent hospitalizations. Patient reports appetite at 50% and energy level at 0%.     REVIEW OF SYSTEMS:  Review of Systems  Constitutional: Positive for fatigue.  Respiratory: Positive for shortness of breath.   Cardiovascular: Positive for leg swelling.  All other systems reviewed and are negative.    PAST MEDICAL/SURGICAL HISTORY:  Past Medical History:  Diagnosis Date  . Anemia   . Chronic bronchitis ([place])   . GAVE (gastric antral vascular ectasia) 09/12/12  . Hypertension   . Hypothyroidism   . Iron deficiency anemia 10/15/2012  . Iron deficiency anemia due to chronic blood loss 04/20/2014  . On home oxygen therapy    "2L/ at night" (11/07/2016)  . Palpitations   . Pneumonia 06/2016; 10/2016  . Syncope and collapse 11/06/2016   Past Surgical History:  Procedure Laterality Date  . BACK SURGERY    . BIOPSY  12/27/2016   Procedure: BIOPSY;  Surgeon: Rogene Houston, MD;  [facility];  Service:  Endoscopy;;  gastric  . COLONOSCOPY  07/24/2012   Procedure: COLONOSCOPY;  Surgeon: Rogene Houston, MD;  [facility];  Service: Endoscopy;  Laterality: N/A;  200  . DILATION AND CURETTAGE OF UTERUS    . ESOPHAGOGASTRODUODENOSCOPY  09/12/2012   Procedure: ESOPHAGOGASTRODUODENOSCOPY (EGD);  Surgeon: Rogene Houston, MD;  [facility];  Service: Endoscopy;  Laterality: N/A;  325-changed to 200 per Ann-pt moved up to [place] to notify pt  . ESOPHAGOGASTRODUODENOSCOPY  11/01/2012   Procedure: ESOPHAGOGASTRODUODENOSCOPY (EGD);  Surgeon: Rogene Houston, MD;  [facility];  Service: Endoscopy;  Laterality: N/A;  1:20  . ESOPHAGOGASTRODUODENOSCOPY N/A 07/04/2013   Procedure: ESOPHAGOGASTRODUODENOSCOPY (EGD);  Surgeon: Rogene Houston, MD;  [facility];  Service: Endoscopy;  Laterality: N/A;  850  . ESOPHAGOGASTRODUODENOSCOPY N/A 08/06/2013   Procedure: ESOPHAGOGASTRODUODENOSCOPY (EGD);  Surgeon: Rogene Houston, MD;  [facility];  Service: Endoscopy;  Laterality: N/A;  1200  . ESOPHAGOGASTRODUODENOSCOPY N/A 10/31/2013   Procedure: ESOPHAGOGASTRODUODENOSCOPY (EGD);  Surgeon: Rogene Houston, MD;  [facility];  Service: Endoscopy;  Laterality: N/A;  125-moved to [place] notified pt  . ESOPHAGOGASTRODUODENOSCOPY N/A 10/23/2014   Procedure: ESOPHAGOGASTRODUODENOSCOPY (EGD);  Surgeon: Rogene Houston, MD;  [facility];  Service: Endoscopy;  Laterality: N/A;  1020  . ESOPHAGOGASTRODUODENOSCOPY N/A 10/11/2016   Procedure: ESOPHAGOGASTRODUODENOSCOPY (EGD);  Surgeon: Rogene Houston, MD;  [facility];  Service: Endoscopy;  Laterality: N/A;  245  . ESOPHAGOGASTRODUODENOSCOPY  N/A 12/27/2016   Procedure: ESOPHAGOGASTRODUODENOSCOPY (EGD);  Surgeon: Rogene Houston, MD;  [facility];  Service: Endoscopy;  Laterality: N/A;  1225  . HEEL SPUR SURGERY Bilateral   . HOT HEMOSTASIS  11/01/2012   Procedure: HOT  HEMOSTASIS (ARGON PLASMA COAGULATION/BICAP);  Surgeon: Rogene Houston, MD;  [facility];  Service: Endoscopy;  Laterality: N/A;  . HOT HEMOSTASIS N/A 07/04/2013   Procedure: HOT HEMOSTASIS (ARGON PLASMA COAGULATION/BICAP);  Surgeon: Rogene Houston, MD;  [facility];  Service: Endoscopy;  Laterality: N/A;  . HOT HEMOSTASIS N/A 08/06/2013   Procedure: HOT HEMOSTASIS (ARGON PLASMA COAGULATION/BICAP);  Surgeon: Rogene Houston, MD;  [facility];  Service: Endoscopy;  Laterality: N/A;  . HOT HEMOSTASIS N/A 10/31/2013   Procedure: HOT HEMOSTASIS (ARGON PLASMA COAGULATION/BICAP);  Surgeon: Rogene Houston, MD;  [facility];  Service: Endoscopy;  Laterality: N/A;  . HOT HEMOSTASIS N/A 10/23/2014   Procedure: HOT HEMOSTASIS (ARGON PLASMA COAGULATION/BICAP);  Surgeon: Rogene Houston, MD;  [facility];  Service: Endoscopy;  Laterality: N/A;  . HOT HEMOSTASIS N/A 10/11/2016   Procedure: HOT HEMOSTASIS (ARGON PLASMA COAGULATION/BICAP);  Surgeon: Rogene Houston, MD;  [facility];  Service: Endoscopy;  Laterality: N/A;  . JOINT REPLACEMENT    . KNEE ARTHROSCOPY Right    "before replacement"  . LUMBAR DISC SURGERY     "bone spur  . LUMBAR FUSION  06/25/2014   L4 & L5  . SHOULDER SURGERY Right    "spur"  . TONSILLECTOMY AND ADENOIDECTOMY    . TOTAL ABDOMINAL HYSTERECTOMY    . TOTAL KNEE ARTHROPLASTY Right   . US ECHOCARDIOGRAPHY  03/12/2006   EF 55-60%     SOCIAL HISTORY:  Social History   Socioeconomic History  . Marital status: Widowed    Spouse name: Not on file  . Number of children: Not on file  . Years of education: Not on file  . Highest education level: Not on file  Occupational History  . Not on file  Tobacco Use  . Smoking status: Never Smoker  . Smokeless tobacco: Never Used  Vaping Use  . Vaping Use: Never used  Substance and Sexual Activity  . Alcohol use: No  . Drug use: No  . Sexual activity: Never    Other Topics Concern  . Not on file  Social History Narrative  . Not on file   Social Determinants of Health   Financial Resource Strain:   . Difficulty of Paying Living Expenses:   Food Insecurity:   . Worried About Charity fundraiser in the Last Year:   . Arboriculturist in the Last Year:   Transportation Needs:   . Film/video editor (Medical):   Marland Kitchen Lack of Transportation (Non-Medical):   Physical Activity:   . Days of Exercise per Week:   . Minutes of Exercise per Session:   Stress:   . Feeling of Stress :   Social Connections:   . Frequency of Communication with Friends and Family:   . Frequency of Social Gatherings with Friends and Family:   . Attends Religious Services:   . Active Member of Clubs or Organizations:   . Attends Archivist Meetings:   Marland Kitchen Marital Status:   Intimate Partner Violence:   . Fear of Current or Ex-Partner:   . Emotionally Abused:   Marland Kitchen Physically Abused:   . Sexually Abused:  FAMILY HISTORY:  Family History  Problem Relation Age of Onset  . Hypertension Mother   . Hypertension Father   . Heart attack Father   . Other Sister        aneurysm on heart  . Other Brother        aneurysm on heart  . Breast cancer Neg Hx     CURRENT MEDICATIONS:  Outpatient Encounter Medications as of 06/08/2020  Medication Sig Note  . albuterol (PROVENTIL) (2.5 MG/3ML) 0.083% nebulizer solution Take 2.5 mg by nebulization every 6 (six) hours as needed for wheezing or shortness of breath. 02/10/2020: PRN  . cyanocobalamin 1000 MCG tablet Take 1,000 mcg by mouth once a week.    . digoxin (DIGOX) 0.125 MG tablet Take 1 tablet mouth daily except none on Sundays or as directed   . feeding supplement, ENSURE ENLIVE, (ENSURE ENLIVE) LIQD Take 237 mLs by mouth 2 (two) times daily between meals. (Patient taking differently: Take 237 mLs by mouth 3 (three) times daily between meals. )   . furosemide (LASIX) 20 MG tablet Take 1 tablet (20 mg total) by  mouth daily. SCHEDULE OV FOR FURTHER REFILLS. 2nd Attempt. (Patient taking differently: Take 20 mg by mouth daily. Pt takes on Sun, Tues, Thurs, Sat only) 08/12/2019: Patient takes on Sunday , Monday , Tuesday , Thursday and Saturday.  . hydrALAZINE (APRESOLINE) 10 MG tablet Take 1 tablet (10 mg total) by mouth 2 (two) times daily.   Marland Kitchen levothyroxine (SYNTHROID) 100 MCG tablet Take 100 mcg by mouth daily.   . metoprolol tartrate (LOPRESSOR) 25 MG tablet TAKE 1 TABLET(25 MG) BY MOUTH TWICE DAILY   . mirtazapine (REMERON) 30 MG tablet Take 30 mg by mouth at bedtime.   . pantoprazole (PROTONIX) 40 MG tablet TK 1 T PO D   . [DISCONTINUED] levothyroxine (SYNTHROID, LEVOTHROID) 112 MCG tablet Take 112 mcg by mouth daily before breakfast.    Facility-Administered Encounter Medications as of 06/08/2020  Medication  . 0.9 %  sodium chloride infusion    ALLERGIES:  Allergies  Allergen Reactions  . Flagyl [Metronidazole] Diarrhea, Nausea Only and Other (See Comments)    Dizziness and feeling of inbalance  . Avapro [Irbesartan] Other (See Comments)    Cough   . Clarithromycin Other (See Comments)    Unknown   . Dexamethasone     UPSET STOMACH  . Losartan Potassium-Hctz Other (See Comments)    Unknown  . Maxzide [Hydrochlorothiazide W-Triamterene] Other (See Comments)    Unknown   . Ziac [Bisoprolol-Hydrochlorothiazide]     Thinks caused itching and cough 08/12/12  . Penicillins Swelling and Rash    Has patient had a PCN reaction causing immediate rash, facial/tongue/throat swelling, SOB or lightheadedness with hypotension: no Has patient had a PCN reaction causing severe rash involving mucus membranes or skin necrosis: no Has patient had a PCN reaction that required hospitalization {no Has patient had a PCN reaction occurring within the last 10 years: {no If all of the above answers are "NO", then may proceed with Cephalosporin use.  . Sulfa Drugs Cross Reactors Rash     PHYSICAL EXAM:  ECOG  Performance status: 1  Vitals:   06/08/20 1345  BP: (!) 144/48  Pulse: 75  Resp: 18  Temp: (!) 97.1 F (36.2 C)  SpO2: 96%   Filed Weights   06/08/20 1345  Weight: 103 lb (46.7 kg)   Physical Exam Constitutional:      Appearance: Normal appearance. She is normal weight.  Cardiovascular:     Rate and Rhythm: Normal rate and regular rhythm.     Heart sounds: Normal heart sounds.  Pulmonary:     Effort: Pulmonary effort is normal.     Breath sounds: Normal breath sounds.  Abdominal:     General: Bowel sounds are normal.     Palpations: Abdomen is soft.  Musculoskeletal:        General: Normal range of motion.  Skin:    General: Skin is warm.  Neurological:     Mental Status: She is alert and oriented to person, place, and time. Mental status is at baseline.  Psychiatric:        Mood and Affect: Mood normal.        Behavior: Behavior normal.        Thought Content: Thought content normal.        Judgment: Judgment normal.      LABORATORY DATA:  I have reviewed the labs as listed.  CBC    Component Value Date/Time   WBC 11.0 (H) 06/01/2020 1127   RBC 3.98 06/01/2020 1127   HGB 12.2 06/01/2020 1127   HCT 40.8 06/01/2020 1127   PLT 361 06/01/2020 1127   MCV 102.5 (H) 06/01/2020 1127   MCH 30.7 06/01/2020 1127   MCHC 29.9 (L) 06/01/2020 1127   RDW 13.4 06/01/2020 1127   LYMPHSABS 0.6 (L) 06/01/2020 1127   MONOABS 1.5 (H) 06/01/2020 1127   EOSABS 0.1 06/01/2020 1127   BASOSABS 0.1 06/01/2020 1127   CMP Latest Ref Rng & Units 06/01/2020 12/23/2019 09/18/2019  Glucose 70 - 99 mg/dL 101(H) 103(H) 123(H)  BUN 8 - 23 mg/dL 37(H) 39(H) 39(H)  Creatinine 0.44 - 1.00 mg/dL 0.96 1.05(H) 0.94  Sodium 135 - 145 mmol/L 140 141 143  Potassium 3.5 - 5.1 mmol/L 3.9 5.1 4.3  Chloride 98 - 111 mmol/L 89(L) 92(L) 95(L)  CO2 22 - 32 mmol/L 40(H) 38(H) 38(H)  Calcium 8.9 - 10.3 mg/dL 9.2 9.7 9.5  Total Protein 6.5 - 8.1 g/dL 7.3 7.2 7.0  Total Bilirubin 0.3 - 1.2 mg/dL 0.7 0.5  0.3  Alkaline Phos 38 - 126 U/L 89 85 85  AST 15 - 41 U/L 29 23 27   ALT 0 - 44 U/L 25 17 22    All questions were answered to patient's stated satisfaction. Encouraged patient to call with any new concerns or questions before his next visit to the cancer center and we can certain see him sooner, if needed.     ASSESSMENT & PLAN:  Iron deficiency anemia due to chronic blood loss 1.  Iron deficiency anemia: - History of gastric antral vascular ectasia, last EGD on 12/27/2016 showing normal esophagus, moderate GAVE with bleeding in the antrum and prepyloric region, status post APC. -Last iron infusion was on 06/20/2019. -Patient denies any bright red bleeding per rectum or melena.  She reports her energy is still holding pretty well throughout the day. - Labs on 06/01/2020 showed her hemoglobin 12.2, ferritin 36, percent saturation 9, platelets 361, LDH 138 -She will get 2 infusions of IV iron at this time.  This may help her fatigue. - Patient will follow-up in 6 months with repeat labs  2.  Vitamin B12 deficiency: -Patient was on oral B12 daily. -Labs on 09/18/2019 showed her vitamin B12 at 2110. -Patient did not wish to come off her B12 so we told her to take it once weekly -Labs done on 06/01/2020 showed her vitamin B12 level 951 -We will  recheck at her next visit.     Orders placed this encounter:  Orders Placed This Encounter  Procedures  . Lactate dehydrogenase  . CBC with Differential/Platelet  . Comprehensive metabolic panel  . Ferritin  . Iron and TIBC  . Vitamin B12  . VITAMIN D 25 Hydroxy (Vit-D Deficiency, Fractures)  . Folate      Francene Finders, FNP-C [place] [phone]

## 2020-06-08 NOTE — Assessment & Plan Note (Addendum)
1.  Iron deficiency anemia: - History of gastric antral vascular ectasia, last EGD on 12/27/2016 showing normal esophagus, moderate GAVE with bleeding in the antrum and prepyloric region, status post APC. -Last iron infusion was on 06/20/2019. -Patient denies any bright red bleeding per rectum or melena.  She reports her energy is still holding pretty well throughout the day. - Labs on 06/01/2020 showed her hemoglobin 12.2, ferritin 36, percent saturation 9, platelets 361, LDH 138 -She will get 2 infusions of IV iron at this time.  This may help her fatigue. - Patient will follow-up in 6 months with repeat labs  2.  Vitamin B12 deficiency: -Patient was on oral B12 daily. -Labs on 09/18/2019 showed her vitamin B12 at 2110. -Patient did not wish to come off her B12 so we told her to take it once weekly -Labs done on 06/01/2020 showed her vitamin B12 level 951 -We will recheck at her next visit.

## 2020-06-18 ENCOUNTER — Ambulatory Visit (HOSPITAL_COMMUNITY): Payer: Medicare Other

## 2020-06-18 ENCOUNTER — Other Ambulatory Visit: Payer: Self-pay

## 2020-06-18 ENCOUNTER — Encounter (HOSPITAL_COMMUNITY): Payer: Self-pay

## 2020-06-18 ENCOUNTER — Inpatient Hospital Stay (HOSPITAL_COMMUNITY): Payer: Medicare Other | Attending: Hematology

## 2020-06-18 VITALS — BP 136/51 | HR 56 | Temp 97.3°F | Resp 16

## 2020-06-18 DIAGNOSIS — K31819 Angiodysplasia of stomach and duodenum without bleeding: Secondary | ICD-10-CM | POA: Diagnosis not present

## 2020-06-18 DIAGNOSIS — D500 Iron deficiency anemia secondary to blood loss (chronic): Secondary | ICD-10-CM | POA: Insufficient documentation

## 2020-06-18 MED ORDER — SODIUM CHLORIDE 0.9 % IV SOLN: INTRAVENOUS | Status: DC

## 2020-06-18 MED ORDER — SODIUM CHLORIDE 0.9 % IV SOLN
510.0000 mg | Freq: Once | INTRAVENOUS | Status: AC
  Administered 2020-06-18: 510 mg via INTRAVENOUS
  Filled 2020-06-18: qty 510

## 2020-06-18 MED ORDER — SODIUM CHLORIDE 0.9 % IV SOLN: Freq: Once | INTRAVENOUS | Status: AC

## 2020-06-18 NOTE — Patient Instructions (Signed)
[facility] at [facility]  Discharge Instructions:   _______________________________________________________________  Thank you for choosing [facility] at [facility] to provide your oncology and hematology care.  To afford each patient quality time with our providers, please arrive at least 15 minutes before your scheduled appointment.  You need to re-schedule your appointment if you arrive 10 or more minutes late.  We strive to give you quality time with our providers, and arriving late affects you and other patients whose appointments are after yours.  Also, if you no show three or more times for appointments you may be dismissed from the clinic.  Again, thank you for choosing [facility] at [facility]. Our hope is that these requests will allow you access to exceptional care and in a timely manner. _______________________________________________________________  If you have questions after your visit, please contact our office at [phone] between the hours of 8:30 a.m. and 5:00 p.m. Voicemails left after 4:30 p.m. will not be returned until the following business day. _______________________________________________________________  For prescription refill requests, have your pharmacy contact our office. _______________________________________________________________  Recommendations made by the consultant and any test results will be sent to your referring physician. _______________________________________________________________ 

## 2020-06-18 NOTE — Progress Notes (Signed)
[name] tolerated iron infusion well today.  Discharged via wheelchair with daughter. Vital signs stable prior to discharge.

## 2020-06-22 DIAGNOSIS — R0602 Shortness of breath: Secondary | ICD-10-CM | POA: Diagnosis not present

## 2020-06-22 DIAGNOSIS — R5383 Other fatigue: Secondary | ICD-10-CM | POA: Diagnosis not present

## 2020-06-22 DIAGNOSIS — D509 Iron deficiency anemia, unspecified: Secondary | ICD-10-CM | POA: Diagnosis not present

## 2020-06-22 DIAGNOSIS — E039 Hypothyroidism, unspecified: Secondary | ICD-10-CM | POA: Diagnosis not present

## 2020-06-22 DIAGNOSIS — I509 Heart failure, unspecified: Secondary | ICD-10-CM | POA: Diagnosis not present

## 2020-06-22 DIAGNOSIS — I482 Chronic atrial fibrillation, unspecified: Secondary | ICD-10-CM | POA: Diagnosis not present

## 2020-06-22 DIAGNOSIS — I10 Essential (primary) hypertension: Secondary | ICD-10-CM | POA: Diagnosis not present

## 2020-06-22 DIAGNOSIS — N3946 Mixed incontinence: Secondary | ICD-10-CM | POA: Diagnosis not present

## 2020-06-22 DIAGNOSIS — E43 Unspecified severe protein-calorie malnutrition: Secondary | ICD-10-CM | POA: Diagnosis not present

## 2020-06-22 DIAGNOSIS — R630 Anorexia: Secondary | ICD-10-CM | POA: Diagnosis not present

## 2020-06-22 DIAGNOSIS — R1310 Dysphagia, unspecified: Secondary | ICD-10-CM | POA: Diagnosis not present

## 2020-06-22 DIAGNOSIS — R42 Dizziness and giddiness: Secondary | ICD-10-CM | POA: Diagnosis not present

## 2020-06-22 DIAGNOSIS — R531 Weakness: Secondary | ICD-10-CM | POA: Diagnosis not present

## 2020-06-23 DIAGNOSIS — I509 Heart failure, unspecified: Secondary | ICD-10-CM | POA: Diagnosis not present

## 2020-06-23 DIAGNOSIS — I10 Essential (primary) hypertension: Secondary | ICD-10-CM | POA: Diagnosis not present

## 2020-06-23 DIAGNOSIS — E039 Hypothyroidism, unspecified: Secondary | ICD-10-CM | POA: Diagnosis not present

## 2020-06-23 DIAGNOSIS — D509 Iron deficiency anemia, unspecified: Secondary | ICD-10-CM | POA: Diagnosis not present

## 2020-06-23 DIAGNOSIS — E43 Unspecified severe protein-calorie malnutrition: Secondary | ICD-10-CM | POA: Diagnosis not present

## 2020-06-23 DIAGNOSIS — I482 Chronic atrial fibrillation, unspecified: Secondary | ICD-10-CM | POA: Diagnosis not present

## 2020-06-24 DIAGNOSIS — I482 Chronic atrial fibrillation, unspecified: Secondary | ICD-10-CM | POA: Diagnosis not present

## 2020-06-24 DIAGNOSIS — E43 Unspecified severe protein-calorie malnutrition: Secondary | ICD-10-CM | POA: Diagnosis not present

## 2020-06-24 DIAGNOSIS — D509 Iron deficiency anemia, unspecified: Secondary | ICD-10-CM | POA: Diagnosis not present

## 2020-06-24 DIAGNOSIS — I10 Essential (primary) hypertension: Secondary | ICD-10-CM | POA: Diagnosis not present

## 2020-06-24 DIAGNOSIS — E039 Hypothyroidism, unspecified: Secondary | ICD-10-CM | POA: Diagnosis not present

## 2020-06-24 DIAGNOSIS — I509 Heart failure, unspecified: Secondary | ICD-10-CM | POA: Diagnosis not present

## 2020-06-25 ENCOUNTER — Inpatient Hospital Stay (HOSPITAL_COMMUNITY): Payer: Medicare Other

## 2020-06-25 ENCOUNTER — Encounter (HOSPITAL_COMMUNITY): Payer: Self-pay

## 2020-06-25 ENCOUNTER — Other Ambulatory Visit: Payer: Self-pay

## 2020-06-25 ENCOUNTER — Ambulatory Visit (HOSPITAL_COMMUNITY): Payer: Medicare Other

## 2020-06-25 VITALS — BP 99/59 | HR 63 | Temp 98.6°F | Resp 16

## 2020-06-25 DIAGNOSIS — D500 Iron deficiency anemia secondary to blood loss (chronic): Secondary | ICD-10-CM

## 2020-06-25 DIAGNOSIS — K31819 Angiodysplasia of stomach and duodenum without bleeding: Secondary | ICD-10-CM | POA: Diagnosis not present

## 2020-06-25 MED ORDER — SODIUM CHLORIDE 0.9 % IV SOLN: Freq: Once | INTRAVENOUS | Status: AC

## 2020-06-25 MED ORDER — SODIUM CHLORIDE 0.9 % IV SOLN
510.0000 mg | Freq: Once | INTRAVENOUS | Status: AC
  Administered 2020-06-25: 510 mg via INTRAVENOUS
  Filled 2020-06-25: qty 510

## 2020-06-25 NOTE — Patient Instructions (Signed)
[facility] at [facility] Discharge Instructions  Received Feraheme infusion today. Follow-up as scheduled   Thank you for choosing [facility] at [facility] to provide your oncology and hematology care.  To afford each patient quality time with our provider, please arrive at least 15 minutes before your scheduled appointment time.   If you have a lab appointment with the Cancer Center please come in thru the Main Entrance and check in at the main information desk.  You need to re-schedule your appointment should you arrive 10 or more minutes late.  We strive to give you quality time with our providers, and arriving late affects you and other patients whose appointments are after yours.  Also, if you no show three or more times for appointments you may be dismissed from the clinic at the providers discretion.     Again, thank you for choosing [facility].  Our hope is that these requests will decrease the amount of time that you wait before being seen by our physicians.       _____________________________________________________________  Should you have questions after your visit to [facility], please contact our office at [phone] and follow the prompts.  Our office hours are 8:00 a.m. and 4:30 p.m. Monday - Friday.  Please note that voicemails left after 4:00 p.m. may not be returned until the following business day.  We are closed weekends and major holidays.  You do have access to a nurse 24-7, just call the main number to the clinic [phone] and do not press any options, hold on the line and a nurse will answer the phone.    For prescription refill requests, have your pharmacy contact our office and allow 72 hours.    Due to Covid, you will need to wear a mask upon entering the hospital. If you do not have a mask, a mask will be given to you at the Main Entrance upon arrival. For doctor visits, patients may have  1 support person age 18 or older with them. For treatment visits, patients can not have anyone with them due to social distancing guidelines and our immunocompromised population.     

## 2020-06-25 NOTE — Progress Notes (Signed)
[name] tolerated Feraheme infusion well without complaints or incident. Peripheral IV site checked with positive blood return noted prior to and after infusion. VSS upon discharge. Pt discharged via wheelchair in satisfactory condition accompanied by caregiver

## 2020-06-29 DIAGNOSIS — I509 Heart failure, unspecified: Secondary | ICD-10-CM | POA: Diagnosis not present

## 2020-06-29 DIAGNOSIS — I10 Essential (primary) hypertension: Secondary | ICD-10-CM | POA: Diagnosis not present

## 2020-06-29 DIAGNOSIS — D509 Iron deficiency anemia, unspecified: Secondary | ICD-10-CM | POA: Diagnosis not present

## 2020-06-29 DIAGNOSIS — E039 Hypothyroidism, unspecified: Secondary | ICD-10-CM | POA: Diagnosis not present

## 2020-06-29 DIAGNOSIS — E43 Unspecified severe protein-calorie malnutrition: Secondary | ICD-10-CM | POA: Diagnosis not present

## 2020-06-29 DIAGNOSIS — I482 Chronic atrial fibrillation, unspecified: Secondary | ICD-10-CM | POA: Diagnosis not present

## 2020-06-30 DIAGNOSIS — I10 Essential (primary) hypertension: Secondary | ICD-10-CM | POA: Diagnosis not present

## 2020-06-30 DIAGNOSIS — I482 Chronic atrial fibrillation, unspecified: Secondary | ICD-10-CM | POA: Diagnosis not present

## 2020-06-30 DIAGNOSIS — E039 Hypothyroidism, unspecified: Secondary | ICD-10-CM | POA: Diagnosis not present

## 2020-06-30 DIAGNOSIS — D509 Iron deficiency anemia, unspecified: Secondary | ICD-10-CM | POA: Diagnosis not present

## 2020-06-30 DIAGNOSIS — I509 Heart failure, unspecified: Secondary | ICD-10-CM | POA: Diagnosis not present

## 2020-06-30 DIAGNOSIS — E43 Unspecified severe protein-calorie malnutrition: Secondary | ICD-10-CM | POA: Diagnosis not present

## 2020-07-02 DIAGNOSIS — I482 Chronic atrial fibrillation, unspecified: Secondary | ICD-10-CM | POA: Diagnosis not present

## 2020-07-02 DIAGNOSIS — E039 Hypothyroidism, unspecified: Secondary | ICD-10-CM | POA: Diagnosis not present

## 2020-07-02 DIAGNOSIS — I509 Heart failure, unspecified: Secondary | ICD-10-CM | POA: Diagnosis not present

## 2020-07-02 DIAGNOSIS — I10 Essential (primary) hypertension: Secondary | ICD-10-CM | POA: Diagnosis not present

## 2020-07-02 DIAGNOSIS — E43 Unspecified severe protein-calorie malnutrition: Secondary | ICD-10-CM | POA: Diagnosis not present

## 2020-07-02 DIAGNOSIS — D509 Iron deficiency anemia, unspecified: Secondary | ICD-10-CM | POA: Diagnosis not present

## 2020-07-03 DIAGNOSIS — I10 Essential (primary) hypertension: Secondary | ICD-10-CM | POA: Diagnosis not present

## 2020-07-03 DIAGNOSIS — I482 Chronic atrial fibrillation, unspecified: Secondary | ICD-10-CM | POA: Diagnosis not present

## 2020-07-03 DIAGNOSIS — D509 Iron deficiency anemia, unspecified: Secondary | ICD-10-CM | POA: Diagnosis not present

## 2020-07-03 DIAGNOSIS — I509 Heart failure, unspecified: Secondary | ICD-10-CM | POA: Diagnosis not present

## 2020-07-03 DIAGNOSIS — E43 Unspecified severe protein-calorie malnutrition: Secondary | ICD-10-CM | POA: Diagnosis not present

## 2020-07-03 DIAGNOSIS — E039 Hypothyroidism, unspecified: Secondary | ICD-10-CM | POA: Diagnosis not present

## 2020-07-04 DIAGNOSIS — I482 Chronic atrial fibrillation, unspecified: Secondary | ICD-10-CM | POA: Diagnosis not present

## 2020-07-04 DIAGNOSIS — E43 Unspecified severe protein-calorie malnutrition: Secondary | ICD-10-CM | POA: Diagnosis not present

## 2020-07-04 DIAGNOSIS — D509 Iron deficiency anemia, unspecified: Secondary | ICD-10-CM | POA: Diagnosis not present

## 2020-07-04 DIAGNOSIS — I10 Essential (primary) hypertension: Secondary | ICD-10-CM | POA: Diagnosis not present

## 2020-07-04 DIAGNOSIS — E039 Hypothyroidism, unspecified: Secondary | ICD-10-CM | POA: Diagnosis not present

## 2020-07-04 DIAGNOSIS — I509 Heart failure, unspecified: Secondary | ICD-10-CM | POA: Diagnosis not present

## 2020-07-14 DEATH — deceased

## 2020-12-21 ENCOUNTER — Other Ambulatory Visit (HOSPITAL_COMMUNITY): Payer: Medicare Other

## 2020-12-28 ENCOUNTER — Ambulatory Visit (HOSPITAL_COMMUNITY): Payer: Medicare Other | Admitting: Nurse Practitioner
